# Patient Record
Sex: Female | Born: 1970 | Race: White | Hispanic: No | Smoking: Former smoker
Health system: Southern US, Community
[De-identification: ages and names within clinical notes are randomized; demographics above are authoritative.]

## PROBLEM LIST (undated history)

## (undated) DIAGNOSIS — I639 Cerebral infarction, unspecified: Secondary | ICD-10-CM

## (undated) DIAGNOSIS — I509 Heart failure, unspecified: Secondary | ICD-10-CM

## (undated) DIAGNOSIS — I10 Essential (primary) hypertension: Secondary | ICD-10-CM

## (undated) DIAGNOSIS — F410 Panic disorder [episodic paroxysmal anxiety] without agoraphobia: Secondary | ICD-10-CM

## (undated) DIAGNOSIS — C801 Malignant (primary) neoplasm, unspecified: Secondary | ICD-10-CM

## (undated) HISTORY — PX: ABDOMINAL HYSTERECTOMY: SHX81

## (undated) HISTORY — PX: BACK SURGERY: SHX140

## (undated) HISTORY — PX: TUBAL LIGATION: SHX77

---

## 1997-09-25 ENCOUNTER — Inpatient Hospital Stay (HOSPITAL_COMMUNITY): Admission: AD | Admit: 1997-09-25 | Discharge: 1997-09-25 | Payer: Self-pay | Admitting: Obstetrics & Gynecology

## 1997-12-13 ENCOUNTER — Emergency Department (HOSPITAL_COMMUNITY): Admission: EM | Admit: 1997-12-13 | Discharge: 1997-12-14 | Payer: Self-pay | Admitting: Emergency Medicine

## 1998-01-03 ENCOUNTER — Emergency Department (HOSPITAL_COMMUNITY): Admission: EM | Admit: 1998-01-03 | Discharge: 1998-01-03 | Payer: Self-pay | Admitting: Emergency Medicine

## 2001-04-23 ENCOUNTER — Emergency Department (HOSPITAL_COMMUNITY): Admission: EM | Admit: 2001-04-23 | Discharge: 2001-04-23 | Payer: Self-pay | Admitting: Emergency Medicine

## 2001-06-06 ENCOUNTER — Encounter: Payer: Self-pay | Admitting: General Practice

## 2001-06-06 ENCOUNTER — Encounter: Admission: RE | Admit: 2001-06-06 | Discharge: 2001-06-06 | Payer: Self-pay | Admitting: General Practice

## 2002-02-14 ENCOUNTER — Inpatient Hospital Stay (HOSPITAL_COMMUNITY): Admission: AD | Admit: 2002-02-14 | Discharge: 2002-02-14 | Payer: Self-pay | Admitting: *Deleted

## 2002-04-07 ENCOUNTER — Emergency Department (HOSPITAL_COMMUNITY): Admission: EM | Admit: 2002-04-07 | Discharge: 2002-04-07 | Payer: Self-pay | Admitting: Emergency Medicine

## 2002-10-02 ENCOUNTER — Encounter: Payer: Self-pay | Admitting: General Practice

## 2002-10-02 ENCOUNTER — Encounter: Admission: RE | Admit: 2002-10-02 | Discharge: 2002-10-02 | Payer: Self-pay | Admitting: General Practice

## 2002-12-31 ENCOUNTER — Encounter: Payer: Self-pay | Admitting: Emergency Medicine

## 2002-12-31 ENCOUNTER — Emergency Department (HOSPITAL_COMMUNITY): Admission: AD | Admit: 2002-12-31 | Discharge: 2002-12-31 | Payer: Self-pay | Admitting: Emergency Medicine

## 2003-01-03 ENCOUNTER — Encounter: Admission: RE | Admit: 2003-01-03 | Discharge: 2003-01-03 | Payer: Self-pay | Admitting: Internal Medicine

## 2003-01-17 ENCOUNTER — Encounter: Admission: RE | Admit: 2003-01-17 | Discharge: 2003-01-17 | Payer: Self-pay | Admitting: Internal Medicine

## 2003-03-04 ENCOUNTER — Encounter: Admission: RE | Admit: 2003-03-04 | Discharge: 2003-03-04 | Payer: Self-pay | Admitting: Internal Medicine

## 2003-04-30 ENCOUNTER — Inpatient Hospital Stay (HOSPITAL_COMMUNITY): Admission: AD | Admit: 2003-04-30 | Discharge: 2003-05-07 | Payer: Self-pay | Admitting: Obstetrics & Gynecology

## 2003-05-06 ENCOUNTER — Encounter (INDEPENDENT_AMBULATORY_CARE_PROVIDER_SITE_OTHER): Payer: Self-pay | Admitting: Cardiovascular Disease

## 2003-11-10 ENCOUNTER — Encounter: Admission: RE | Admit: 2003-11-10 | Discharge: 2003-11-10 | Payer: Self-pay | Admitting: Internal Medicine

## 2003-11-24 ENCOUNTER — Encounter: Admission: RE | Admit: 2003-11-24 | Discharge: 2003-11-24 | Payer: Self-pay | Admitting: Internal Medicine

## 2003-12-31 ENCOUNTER — Emergency Department (HOSPITAL_COMMUNITY): Admission: EM | Admit: 2003-12-31 | Discharge: 2003-12-31 | Payer: Self-pay | Admitting: Emergency Medicine

## 2004-01-19 ENCOUNTER — Emergency Department (HOSPITAL_COMMUNITY): Admission: EM | Admit: 2004-01-19 | Discharge: 2004-01-19 | Payer: Self-pay | Admitting: Family Medicine

## 2004-04-12 ENCOUNTER — Emergency Department (HOSPITAL_COMMUNITY): Admission: EM | Admit: 2004-04-12 | Discharge: 2004-04-12 | Payer: Self-pay | Admitting: Family Medicine

## 2004-04-26 ENCOUNTER — Emergency Department (HOSPITAL_COMMUNITY): Admission: EM | Admit: 2004-04-26 | Discharge: 2004-04-26 | Payer: Self-pay | Admitting: Family Medicine

## 2004-05-09 ENCOUNTER — Emergency Department (HOSPITAL_COMMUNITY): Admission: EM | Admit: 2004-05-09 | Discharge: 2004-05-09 | Payer: Self-pay | Admitting: Family Medicine

## 2004-06-21 ENCOUNTER — Inpatient Hospital Stay (HOSPITAL_COMMUNITY): Admission: AD | Admit: 2004-06-21 | Discharge: 2004-06-21 | Payer: Self-pay | Admitting: Obstetrics & Gynecology

## 2005-05-05 ENCOUNTER — Emergency Department (HOSPITAL_COMMUNITY): Admission: EM | Admit: 2005-05-05 | Discharge: 2005-05-05 | Payer: Self-pay | Admitting: Emergency Medicine

## 2005-08-06 ENCOUNTER — Emergency Department (HOSPITAL_COMMUNITY): Admission: EM | Admit: 2005-08-06 | Discharge: 2005-08-06 | Payer: Self-pay | Admitting: Emergency Medicine

## 2006-02-02 ENCOUNTER — Emergency Department (HOSPITAL_COMMUNITY): Admission: EM | Admit: 2006-02-02 | Discharge: 2006-02-03 | Payer: Self-pay | Admitting: Emergency Medicine

## 2006-06-14 ENCOUNTER — Emergency Department (HOSPITAL_COMMUNITY): Admission: EM | Admit: 2006-06-14 | Discharge: 2006-06-14 | Payer: Self-pay | Admitting: Family Medicine

## 2006-06-29 ENCOUNTER — Emergency Department (HOSPITAL_COMMUNITY): Admission: EM | Admit: 2006-06-29 | Discharge: 2006-06-30 | Payer: Self-pay | Admitting: Emergency Medicine

## 2006-11-17 ENCOUNTER — Emergency Department (HOSPITAL_COMMUNITY): Admission: EM | Admit: 2006-11-17 | Discharge: 2006-11-17 | Payer: Self-pay | Admitting: Emergency Medicine

## 2007-07-09 ENCOUNTER — Emergency Department (HOSPITAL_COMMUNITY): Admission: EM | Admit: 2007-07-09 | Discharge: 2007-07-09 | Payer: Self-pay | Admitting: Emergency Medicine

## 2007-11-10 ENCOUNTER — Emergency Department (HOSPITAL_COMMUNITY): Admission: EM | Admit: 2007-11-10 | Discharge: 2007-11-11 | Payer: Self-pay | Admitting: Emergency Medicine

## 2007-11-25 ENCOUNTER — Inpatient Hospital Stay (HOSPITAL_COMMUNITY): Admission: AD | Admit: 2007-11-25 | Discharge: 2007-11-25 | Payer: Self-pay | Admitting: Obstetrics and Gynecology

## 2007-11-30 ENCOUNTER — Emergency Department (HOSPITAL_COMMUNITY): Admission: EM | Admit: 2007-11-30 | Discharge: 2007-11-30 | Payer: Self-pay | Admitting: Psychology

## 2007-12-02 ENCOUNTER — Inpatient Hospital Stay (HOSPITAL_COMMUNITY): Admission: EM | Admit: 2007-12-02 | Discharge: 2007-12-05 | Payer: Self-pay | Admitting: Emergency Medicine

## 2008-05-04 ENCOUNTER — Emergency Department (HOSPITAL_COMMUNITY): Admission: EM | Admit: 2008-05-04 | Discharge: 2008-05-04 | Payer: Self-pay | Admitting: Emergency Medicine

## 2008-05-07 ENCOUNTER — Encounter: Admission: RE | Admit: 2008-05-07 | Discharge: 2008-05-07 | Payer: Self-pay | Admitting: Internal Medicine

## 2009-05-16 ENCOUNTER — Emergency Department (HOSPITAL_COMMUNITY): Admission: EM | Admit: 2009-05-16 | Discharge: 2009-05-17 | Payer: Self-pay | Admitting: Emergency Medicine

## 2009-05-17 ENCOUNTER — Ambulatory Visit: Payer: Self-pay | Admitting: Psychiatry

## 2009-12-05 ENCOUNTER — Emergency Department (HOSPITAL_COMMUNITY): Admission: EM | Admit: 2009-12-05 | Discharge: 2009-12-05 | Payer: Self-pay | Admitting: Family Medicine

## 2010-01-02 ENCOUNTER — Emergency Department (HOSPITAL_COMMUNITY): Admission: EM | Admit: 2010-01-02 | Discharge: 2010-01-02 | Payer: Self-pay | Admitting: Family Medicine

## 2010-07-11 ENCOUNTER — Encounter: Payer: Self-pay | Admitting: Orthopedic Surgery

## 2010-07-11 ENCOUNTER — Encounter: Payer: Self-pay | Admitting: Unknown Physician Specialty

## 2010-09-04 LAB — POCT URINALYSIS DIP (DEVICE)
Glucose, UA: NEGATIVE mg/dL
Ketones, ur: NEGATIVE mg/dL
Nitrite: NEGATIVE
Protein, ur: NEGATIVE mg/dL
Specific Gravity, Urine: 1.025 (ref 1.005–1.030)
Urobilinogen, UA: 0.2 mg/dL (ref 0.0–1.0)
pH: 5.5 (ref 5.0–8.0)

## 2010-09-04 LAB — WET PREP, GENITAL: Yeast Wet Prep HPF POC: NONE SEEN

## 2010-09-04 LAB — GC/CHLAMYDIA PROBE AMP, GENITAL
Chlamydia, DNA Probe: NEGATIVE
GC Probe Amp, Genital: NEGATIVE

## 2010-09-04 LAB — POCT PREGNANCY, URINE: Preg Test, Ur: NEGATIVE

## 2010-09-22 LAB — CBC
HCT: 42.1 % (ref 36.0–46.0)
Hemoglobin: 14.7 g/dL (ref 12.0–15.0)
MCHC: 34.8 g/dL (ref 30.0–36.0)
MCV: 89.7 fL (ref 78.0–100.0)
Platelets: 190 10*3/uL (ref 150–400)
RBC: 4.69 MIL/uL (ref 3.87–5.11)
RDW: 14.4 % (ref 11.5–15.5)
WBC: 8 10*3/uL (ref 4.0–10.5)

## 2010-09-22 LAB — RAPID URINE DRUG SCREEN, HOSP PERFORMED
Amphetamines: NOT DETECTED
Barbiturates: NOT DETECTED
Benzodiazepines: NOT DETECTED
Cocaine: NOT DETECTED
Opiates: NOT DETECTED
Tetrahydrocannabinol: POSITIVE — AB

## 2010-09-22 LAB — POCT CARDIAC MARKERS
CKMB, poc: 2.3 ng/mL (ref 1.0–8.0)
CKMB, poc: 4.1 ng/mL (ref 1.0–8.0)
Myoglobin, poc: 97.3 ng/mL (ref 12–200)
Myoglobin, poc: 97.6 ng/mL (ref 12–200)
Troponin i, poc: <0.05 ng/mL (ref 0.00–0.09)
Troponin i, poc: <0.05 ng/mL (ref 0.00–0.09)

## 2010-09-22 LAB — BASIC METABOLIC PANEL
BUN: 6 mg/dL (ref 6–23)
CO2: 20 mEq/L (ref 19–32)
Calcium: 9.2 mg/dL (ref 8.4–10.5)
Chloride: 109 mEq/L (ref 96–112)
Creatinine, Ser: 0.49 mg/dL (ref 0.4–1.2)
GFR calc Af Amer: >60 mL/min (ref >60)
GFR calc non Af Amer: >60 mL/min (ref >60)
Glucose, Bld: 111 mg/dL — ABNORMAL HIGH (ref 70–99)
Potassium: 4.1 mEq/L (ref 3.5–5.1)
Sodium: 135 mEq/L (ref 135–145)

## 2010-09-22 LAB — DIFFERENTIAL
Basophils Absolute: 0.1 10*3/uL (ref 0.0–0.1)
Basophils Relative: 1 % (ref 0–1)
Eosinophils Absolute: 0.1 10*3/uL (ref 0.0–0.7)
Eosinophils Relative: 1 % (ref 0–5)
Lymphocytes Relative: 24 % (ref 12–46)
Lymphs Abs: 1.9 10*3/uL (ref 0.7–4.0)
Monocytes Absolute: 0.5 10*3/uL (ref 0.1–1.0)
Monocytes Relative: 6 % (ref 3–12)
Neutro Abs: 5.5 10*3/uL (ref 1.7–7.7)
Neutrophils Relative %: 68 % (ref 43–77)

## 2010-09-22 LAB — POCT PREGNANCY, URINE: Preg Test, Ur: NEGATIVE

## 2010-09-22 LAB — SALICYLATE LEVEL
Salicylate Lvl: <4 mg/dL (ref 2.8–20.0)
Salicylate Lvl: <4 mg/dL (ref 2.8–20.0)

## 2010-09-22 LAB — ETHANOL: Alcohol, Ethyl (B): <5 mg/dL (ref 0–10)

## 2010-09-22 LAB — ACETAMINOPHEN LEVEL
Acetaminophen (Tylenol), Serum: <10 ug/mL — ABNORMAL LOW (ref 10–30)
Acetaminophen (Tylenol), Serum: <10 ug/mL — ABNORMAL LOW (ref 10–30)

## 2010-09-22 LAB — TRICYCLICS SCREEN, URINE: TCA Scrn: NOT DETECTED

## 2010-11-02 NOTE — H&P (Signed)
Heidi Johnson, Heidi Johnson            ACCOUNT NO.:  [id number]   MEDICAL RECORD NO.:  [id number]          PATIENT TYPE:  INP   LOCATION:  0101                         FACILITY:  [facility]   PHYSICIAN:  Isidor Holts, M.D.  DATE OF BIRTH:  July 28, 1970   DATE OF ADMISSION:  12/02/2007  DATE OF DISCHARGE:                              HISTORY & PHYSICAL   PRIMARY MEDICAL DOCTOR:  Unassigned.   CHIEF COMPLAINT:  Boil left buttock, associated with pain and redness  over the past 3-4 days, also with drainage. The patient has been  utilizing heat pads.   HISTORY OF PRESENT ILLNESS:  Essentially as above.  The patient denies  any pyrexia.  The patient was seen at the emergency department for above  symptoms approximately 2 days ago, and discharged after evaluation, on  Percocet and Doxycycline.  However, symptoms have escalated with  increasing pain, redness and drainage.  The patient has now developed  some spots in the lower back.   PAST MEDICAL HISTORY.:  1. Hypertension.  2. GERD.  3. 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  4. Ex-smoker, quit approximately one month ago.   MEDICATION HISTORY.:  1. Enalapril 5 mg p.o. daily.  2. Percocet p.r.n.  3. Doxycycline 100 mg p.o. b.i.d.   Last two medications were started 2 days ago.   ALLERGIES:  Septrin/Sulfa   REVIEW OF SYSTEMS:  As per HPI and chief complaint.  Denies vomiting or  diarrhea.  Denies abdominal pain.  Denies chest pain, shortness of  breath or cough.   SOCIAL HISTORY:  The patient is single, has three offspring, ex-smoker,  used to smoke one pack of cigarettes a day, but quit on Oct 31, 2007.  Denies alcohol use or drug abuse.  However utilizes marijuana.  She  works as a Investment banker, operational at [employer].   FAMILY HISTORY:  Mother is age 23 years.  She has hypertension and  dyslipidemia.  Father passed away at age 46 years from a ruptured brain  aneurysm.  He  had a history of heart disease.   PHYSICAL EXAMINATION:  VITALS:  Temperature 98.5, pulse 91 per minute,  respiratory 20, BP 151/93 mmHg, pulse oximeter 100% on 3 liters of  oxygen.  The patient did not appear to be in obvious acute distress at  time of this evaluation.  Alert, communicative, garrulous, not short of  breath at rest.  HEENT:  No clinical pallor or jaundice.  No conjunctival injection.  Throat is clear.  NECK:  Supple.  JVP not seen.  No palpable lymphadenopathy.  No palpable  goiter.  CHEST:  Clinically clear to auscultation.  No wheezes or crackles.  HEART:  Sounds 1 and 2 heard, normal, regular, no murmurs.  ABDOMEN:  Full, soft and nontender.  No palpable organomegaly.  No  palpable masses.  Normal bowel sounds.  LOWER EXTREMITIES:  No pitting edema.  Palpable peripheral pulses.  The  patient has a circumscribed area approximately 10 cm in diameter with  induration and a central area of drainage in the  left lower gluteal  region/posterior upper thigh.  There is a kissing lesion directly  opposite.  She also has two approximately 1 cm localized reddish lesions  in the lower back which may be early boils.  MUSCULOSKELETAL:  System unremarkable.  CENTRAL NERVOUS SYSTEM: No focal neurologic deficit on gross  examination.   INVESTIGATIONS:  Labs are still pending.   ASSESSMENT AND PLAN:  1. Left gluteal abscess/cellulitis.  I have discussed with ED MD, and      he has done an I&D in the emergency department.  I shall request      culture of abscess, do blood cultures.  Meanwhile, commence therapy      with intravenous Vancomycin, given previous history of MRSA.  We      shall also institute contact precautions.   1. Hypertension.  Blood pressure is uncontrolled at present.  We shall      continue the patient's pre-admission dose of Enalapril, monitor,      adjust medications as indicated.   1. GERD.  We shall continue proton pump inhibitor during the course of       this hospitalization.   Note:  For completeness, will do urine drug screen and HbA1c.   Further management will depend on clinical course.      Isidor Holts, M.D.  Electronically Signed     CO/MEDQ  D:  12/02/2007  T:  12/02/2007  Job:  [id number]

## 2010-11-02 NOTE — Discharge Summary (Signed)
Heidi Johnson, Heidi Johnson            ACCOUNT NO.:  [id number]   MEDICAL RECORD NO.:  [id number]          PATIENT TYPE:  INP   LOCATION:  1613                         FACILITY:  [facility]   PHYSICIAN:  Isidor Holts, M.D.  DATE OF BIRTH:  12-Jan-1971   DATE OF ADMISSION:  12/02/2007  DATE OF DISCHARGE:  12/05/2007                               DISCHARGE SUMMARY   DISCHARGE DIAGNOSES:  1. Methicillin resistant Staphylococcus aureus abscess left posterior      upper thigh region, status post incision and drainage on December 02, 2007.  2. Hypertension.  3. Gastroesophageal reflux disease).  4. Ex-smoker, quit approximately 1 month ago.   DISCHARGE MEDICATIONS:  1. Doxycycline 100 mg p.o. b.i.d. for 11 days only.  2. Oxycodone 5 mg p.o. p.r.n. q.6 h. A total of 20 pills have been      dispensed.  3. Enalapril 10 mg p.o. daily (was on 5 mg p.o. daily).   PROCEDURES:  None.   CONSULTATIONS:  None.   ADMISSION HISTORY:  As in H and P notes of December 02, 2007.  However, in  brief, this is a 40 year old female, with known history of hypertension,  GERD, mons furunculosis November of 2004 secondary to MRSA, associated  with MRSA bacteremia, ex-smoker quit approximately 1 month ago now  presenting with a boil in the left upper thigh posteriorly just below  the left buttock and associated with increasing pain and redness over a  3-4 day period.  Also with drainage.  The patient was initially seen in  the emergency department 2 days prior to admission, treated with  Percocet and Doxycycline, and then discharged.  However, because of  persistent symptomatology, the patient represented and was admitted for  further evaluation, investigation and management.   CLINICAL COURSE:  1. MRSA abscess posterior left upper thigh.  For details of      presentation, refer to admission history, above.  I&D was carried      out in the emergency department by emergency room MD.  The patient      was commenced  on intravenous Vancomycin, given her known history of      previous MRSA infection, contact isolation was instituted,      analgesics were administered and regular dressings were carried out      on a twice daily basis, supervised by the wound management care      team.  Blood cultures remained consistently negative, however,      wound cultures grew MRSA sensitive to Doxycycline and Bactrim, as      well as Vancomycin.  Clinical improvement was steady, with      diminution of perifocal inflammatory phenomena and drainage, and by      December 05, 2007, the patient felt considerably better, there was no      further perifocal induration, and discharge was minimal.  She has      been transitioned to Doxycycline, and as of the date of this      dictation had completed 3 days of intravenous Vancomycin.      Doxycycline will  be continued for further period of 11 days, to      complete a total of 14 days of treatment.  Home health RN has been      arranged for dressing changes.   1. Hypertension.  This was addressed with Enalapril at an increased      dose of 10 mg p.o. daily.  The patient has remained normotensive,      during the course of her hospitalization.   1. GERD.  The patient was managed with proton pump inhibitor.  There      were no problems referable to this, during the course of her      hospitalization.   DISPOSITION:  The patient was on December 05, 2007, considered sufficiently  clinically stable and recovered to be discharged. She was therefore,  discharged accordingly.   DIET:  Heart-healthy.   ACTIVITY:  As tolerated.   WOUND CARE:  The patient may shower and bathe.  She has been recommended  twice daily dressing changes, with wound packed with iodoform.   FOLLOWUP INSTRUCTIONS:  The patient is recommended to follow up with her  primary MD per prior scheduled appointment.   SPECIAL INSTRUCTIONS:  Home health RN has been arranged for dressing  changes.  The patient is  recommended to return to regular duties on December 12, 2007.      Isidor Holts, M.D.  Electronically Signed     CO/MEDQ  D:  12/05/2007  T:  12/05/2007  Job:  [id number]

## 2010-11-05 NOTE — Discharge Summary (Signed)
NAME:  Heidi Johnson, Heidi Johnson                      ACCOUNT NO.:  [id number]   MEDICAL RECORD NO.:  [id number]                   PATIENT TYPE:  INP   LOCATION:  9312                                 FACILITY:  [facility]   PHYSICIAN:  Lesly Dukes, M.D.              DATE OF BIRTH:  1971-05-07   DATE OF ADMISSION:  04/30/2003  DATE OF DISCHARGE:                                 DISCHARGE SUMMARY   CONSULTS:  1. Surgery - Dr. Consuello Bossier.  2. Infectious disease - Dr. Cliffton Asters.   DISCHARGE DIAGNOSES:  1. Mons furunculosis, methicillin-resistant Staphylococcus aureus.  2. Methicillin-resistant Staphylococcus aureus bacteremia.  3. Hypertension.  4. Gastroesophageal reflux disease.   DISCHARGE MEDICATIONS:  1. Doxycycline 100 mg p.o. b.i.d. x7 days.  2. Percocet 5/325 mg one to two tablets p.o. q.4-6h. p.r.n. pain.  3. Singulair 10 mg p.o. daily.  4. Protonix 40 mg p.o. daily.  5. Hydrochlorothiazide 25 mg p.o. daily.  6. Lisinopril 20 mg p.o. daily.  7. Bactroban or Neosporin ointment to affected area daily x3 days.   HOSPITAL COURSE BY PROBLEM LIST:  Heidi Johnson is a 40 year old female who  was admitted for a suprapubic rash that she had been having over four days  that was diagnosed to be furunculosis.  Infectious disease was consulted.  Dr. Roxan Hockey and Dr. Orvan Falconer reported that there is an outbreak of MRSA  infection in young, healthy people without risk factors.  Wound culture was  found to be positive for MRSA in addition to blood cultures positive for  MRSA after around 36 hours.  The patient was loaded with Vancomycin on her  initial day prior to positive blood cultures but this was continued  throughout the hospital course so that the patient received a full total  dose of seven days IV vancomycin.  Surgery was consulted and performed  several incision and drainages which did not release much purulent material.  To evaluate the patient's MRSA bacteremia a  transesophageal echocardiogram  was performed to determine if there were any valvular growths.  This was  found to be negative for bacterial endocarditis.  The patient had repeat  blood cultures drawn on May 04, 2003 after four days of IV vancomycin  which returned as no growth x48 hours.  During the patient's hospital stay  her hypertension and gastroesophageal  reflux disease were managed with her home medications with no changes.  The  patient was discharged on May 07, 2003 in stable and improved  condition.  The patient will follow up with Dr. Zachery Dakins in surgery one  week after discharge.     Estill Cotta, MD                              Lesly Dukes, M.D.    AW/MEDQ  D:  05/07/2003  T:  05/07/2003  Job:  [id number]  cc:   Anselm Pancoast. Zachery Dakins, M.D.  [address]  [place]  [place] [address]  Fax: [phone]

## 2010-11-05 NOTE — Op Note (Signed)
NAME:  CHIKITA, DOGAN                      ACCOUNT NO.:  [id number]   MEDICAL RECORD NO.:  [id number]                   PATIENT TYPE:  INP   LOCATION:  9312                                 FACILITY:  [facility]   PHYSICIAN:  Anselm Pancoast. Zachery Dakins, M.D.          DATE OF BIRTH:  Aug 25, 1970   DATE OF PROCEDURE:  04/30/2003  DATE OF DISCHARGE:                                 OPERATIVE REPORT   COMPLAINT:  Abscess of groin.   HISTORY:  Heidi Johnson is a 40 year old Caucasian female who presented  to the emergency room with the following history.  She said that  approximately two weeks ago she had a tooth infection and was placed on  penicillin and Flagyl by her dentist.  He later removed the tooth and the  area is improving.  She then started developing problems with infection  which he thought was an ingrown hair in the pubic area.  She does shave her  pubic area and these got worse and then over the last 48 hours she has  applied fat back to some of these areas to try to bring them to a head.   She presented to the emergency room here this afternoon and on physical exam  she had a low grade temperature of 99.2, pulse 97, blood pressure 131/97.  On physical exam her infections appear to be more related to the pubic area  in that she has got a fairly large area that is draining kind of thick  purulence in the right side of the suprapubic area and then several other  smaller abscesses within the genitalia area, some that are draining and  others are just fluctuant.  There is an area of erythema and an abscess over  in the left lateral back that is not really in the pubic hair but is in the  same lymphatic distribution as these areas and she was being admitted by Dr.  Ophelia Charter who asked for general surgical consultation to see whether we were  developing a problem of necrotizing fasciitis or could this be a MRSA  infection.  On physical exam she has had some pain medication and is a  little  bit woozy but is able to ambulate and go to the bathroom and void and  then returned.   DESCRIPTION OF PROCEDURE:  On physical exam she has an obvious abscess in  the right suprapubic area that I anesthetized and opened up.  There was a  moderate amount of frank purulent drainage which was cultured and then I  opened another one of these little areas and will get a gram stain and  culture that with the same swab.  The abscesses are probably just kind of  multiple staph abscesses probably secondary to shaving her pubic hair, and  she has been on penicillin and Flagyl but these are not anti-staph drugs and  as far as the likelihood of this being MRSA, it is probably  more likely just  a pure staph infection in my opinion.  I do not see any areas that are not  in this localized area that would make you consider a systemic septicemia  with these multiple abscesses on my examination of her back, breasts,  axillae, legs, feet, etc.  She denies being a diabetic.  She is slightly  overweight and we will check a glucose and I would recommend that she be on  Unasyn and Flagyl for now or Kefzol and Flagyl and we will see what gram  stain shows, if this shows a staph gram-positive cocci, which is what I  would expect.  She has had a pelvic exam that did not show any infection  either in her vagina and the larger abscess that I unroofed may require  further drainage but hopefully with this drainage and warm soaks no further  drainage will be necessary.   DISPOSITION:  She will be admitted to room number 312 and I will see her  again in the  morning.                                               Anselm Pancoast. Zachery Dakins, M.D.    WJW/MEDQ  D:  04/30/2003  T:  05/01/2003  Job:  [id number]

## 2011-03-10 LAB — CBC
HCT: 39.2
Hemoglobin: 13.7
MCHC: 34.9
MCV: 87.2
Platelets: 196
RBC: 4.5
RDW: 14.9
WBC: 7.5

## 2011-03-10 LAB — COMPREHENSIVE METABOLIC PANEL
ALT: 28
AST: 29
Albumin: 4
Alkaline Phosphatase: 64
BUN: 5 — ABNORMAL LOW
CO2: 24
Calcium: 9.3
Chloride: 109
Creatinine, Ser: 0.77
GFR calc Af Amer: >60
GFR calc non Af Amer: >60
Glucose, Bld: 86
Potassium: 4.3
Sodium: 141
Total Bilirubin: 1
Total Protein: 6.4

## 2011-03-10 LAB — URINE CULTURE: Colony Count: >=100000

## 2011-03-10 LAB — DIFFERENTIAL
Basophils Absolute: 0.1
Basophils Relative: 1
Eosinophils Absolute: 0.1
Eosinophils Relative: 2
Lymphocytes Relative: 33
Lymphs Abs: 2.5
Monocytes Absolute: 0.6
Monocytes Relative: 8
Neutro Abs: 4.3
Neutrophils Relative %: 57

## 2011-03-10 LAB — URINALYSIS, ROUTINE W REFLEX MICROSCOPIC
Bilirubin Urine: NEGATIVE
Glucose, UA: NEGATIVE
Hgb urine dipstick: NEGATIVE
Ketones, ur: NEGATIVE
Nitrite: POSITIVE — AB
Protein, ur: NEGATIVE
Specific Gravity, Urine: 1.015
Urobilinogen, UA: 0.2
pH: 7

## 2011-03-10 LAB — RAPID URINE DRUG SCREEN, HOSP PERFORMED
Amphetamines: NOT DETECTED
Barbiturates: NOT DETECTED
Benzodiazepines: POSITIVE — AB
Cocaine: NOT DETECTED
Opiates: NOT DETECTED
Tetrahydrocannabinol: POSITIVE — AB

## 2011-03-10 LAB — POCT CARDIAC MARKERS
CKMB, poc: 3.4
Myoglobin, poc: 71.8
Operator id: 1192
Troponin i, poc: <0.05

## 2011-03-10 LAB — URINE MICROSCOPIC-ADD ON

## 2011-03-16 LAB — DIFFERENTIAL
Basophils Absolute: 0.1
Basophils Relative: 1
Eosinophils Absolute: 0.1
Eosinophils Relative: 1
Lymphocytes Relative: 35
Lymphs Abs: 2.7
Monocytes Absolute: 0.4
Monocytes Relative: 5
Neutro Abs: 4.5
Neutrophils Relative %: 58

## 2011-03-16 LAB — URINALYSIS, ROUTINE W REFLEX MICROSCOPIC
Bilirubin Urine: NEGATIVE
Glucose, UA: NEGATIVE
Ketones, ur: NEGATIVE
Nitrite: NEGATIVE
Protein, ur: NEGATIVE
Specific Gravity, Urine: 1.025
Urobilinogen, UA: 0.2
pH: 5.5

## 2011-03-16 LAB — POCT CARDIAC MARKERS
CKMB, poc: 3.8
CKMB, poc: 4.2
Myoglobin, poc: 108
Myoglobin, poc: 86.2
Operator id: 294591
Operator id: 294591
Troponin i, poc: <0.05
Troponin i, poc: <0.05

## 2011-03-16 LAB — CBC
HCT: 37.7
Hemoglobin: 13.1
MCHC: 34.7
MCV: 87.6
Platelets: 156
RBC: 4.31
RDW: 15
WBC: 7.7

## 2011-03-16 LAB — POCT I-STAT, CHEM 8
BUN: 11
Calcium, Ion: 1.18
Chloride: 107
Creatinine, Ser: 0.8
Glucose, Bld: 84
HCT: 42
Hemoglobin: 14.3
Potassium: 3.7
Sodium: 141
TCO2: 22

## 2011-03-16 LAB — URINE MICROSCOPIC-ADD ON

## 2011-03-16 LAB — POCT PREGNANCY, URINE
Operator id: 294591
Preg Test, Ur: NEGATIVE

## 2011-03-17 LAB — CBC
HCT: 38.1
HCT: 34.3 — ABNORMAL LOW
HCT: 35.7 — ABNORMAL LOW
Hemoglobin: 13
Hemoglobin: 11.6 — ABNORMAL LOW
Hemoglobin: 12
MCHC: 34
MCHC: 33.4
MCHC: 33.9
MCV: 87.6
MCV: 87.4
MCV: 88.1
Platelets: 172
Platelets: 149 — ABNORMAL LOW
Platelets: 168
RBC: 4.35
RBC: 3.92
RBC: 4.06
RDW: 15.4
RDW: 14.6
RDW: 15.2
WBC: 8.9
WBC: 5.7
WBC: 7.3

## 2011-03-17 LAB — DIFFERENTIAL
Basophils Absolute: 0
Basophils Relative: 1
Eosinophils Absolute: 0.1
Eosinophils Relative: 1
Lymphocytes Relative: 18
Lymphs Abs: 1.6
Monocytes Absolute: 0.4
Monocytes Relative: 5
Neutro Abs: 6.8
Neutrophils Relative %: 76

## 2011-03-17 LAB — WOUND CULTURE

## 2011-03-17 LAB — COMPREHENSIVE METABOLIC PANEL
ALT: 36 — ABNORMAL HIGH
AST: 32
Albumin: 3.4 — ABNORMAL LOW
Alkaline Phosphatase: 80
BUN: 5 — ABNORMAL LOW
CO2: 22
Calcium: 8.9
Chloride: 112
Creatinine, Ser: 0.65
GFR calc Af Amer: >60
GFR calc non Af Amer: >60
Glucose, Bld: 132 — ABNORMAL HIGH
Potassium: 3.6
Sodium: 140
Total Bilirubin: 0.7
Total Protein: 6.9

## 2011-03-17 LAB — CULTURE, BLOOD (ROUTINE X 2)
Culture: NO GROWTH
Culture: NO GROWTH

## 2011-03-17 LAB — VANCOMYCIN, TROUGH: Vancomycin Tr: 6.6

## 2011-03-17 LAB — WET PREP, GENITAL
Clue Cells Wet Prep HPF POC: NONE SEEN
Trich, Wet Prep: NONE SEEN
Yeast Wet Prep HPF POC: NONE SEEN

## 2011-03-17 LAB — BASIC METABOLIC PANEL
BUN: 4 — ABNORMAL LOW
CO2: 29
Calcium: 8.7
Chloride: 108
Creatinine, Ser: 0.59
GFR calc Af Amer: >60
GFR calc non Af Amer: >60
Glucose, Bld: 108 — ABNORMAL HIGH
Potassium: 3.8
Sodium: 143

## 2011-03-17 LAB — GC/CHLAMYDIA PROBE AMP, GENITAL
Chlamydia, DNA Probe: NEGATIVE
GC Probe Amp, Genital: NEGATIVE

## 2011-03-17 LAB — HEMOGLOBIN A1C
Hgb A1c MFr Bld: 5.2
Mean Plasma Glucose: 108

## 2013-11-20 DIAGNOSIS — M549 Dorsalgia, unspecified: Secondary | ICD-10-CM | POA: Insufficient documentation

## 2015-01-13 ENCOUNTER — Encounter (HOSPITAL_COMMUNITY): Payer: Self-pay | Admitting: *Deleted

## 2015-01-13 DIAGNOSIS — Z91040 Latex allergy status: Secondary | ICD-10-CM | POA: Insufficient documentation

## 2015-01-13 DIAGNOSIS — Z3202 Encounter for pregnancy test, result negative: Secondary | ICD-10-CM | POA: Diagnosis not present

## 2015-01-13 DIAGNOSIS — R51 Headache: Secondary | ICD-10-CM | POA: Insufficient documentation

## 2015-01-13 DIAGNOSIS — R202 Paresthesia of skin: Secondary | ICD-10-CM | POA: Insufficient documentation

## 2015-01-13 DIAGNOSIS — I509 Heart failure, unspecified: Secondary | ICD-10-CM | POA: Diagnosis not present

## 2015-01-13 DIAGNOSIS — Z8673 Personal history of transient ischemic attack (TIA), and cerebral infarction without residual deficits: Secondary | ICD-10-CM | POA: Diagnosis not present

## 2015-01-13 DIAGNOSIS — F1110 Opioid abuse, uncomplicated: Secondary | ICD-10-CM | POA: Insufficient documentation

## 2015-01-13 DIAGNOSIS — F1210 Cannabis abuse, uncomplicated: Secondary | ICD-10-CM | POA: Insufficient documentation

## 2015-01-13 DIAGNOSIS — I10 Essential (primary) hypertension: Secondary | ICD-10-CM | POA: Diagnosis not present

## 2015-01-13 NOTE — ED Notes (Signed)
Pt states that she had her blood drawn at [facility]. States she also had an echo at [facility].

## 2015-01-13 NOTE — ED Notes (Addendum)
Pt states that she has been seen at [facility] multiple times with multiple complaints. States that today she is experiencing headache and dehydration.  States that 2 days ago she was at [facility] and had a workup for stroke but had no brain bleed but was diagnosed with a stroke. Pt was also worked up for chest pain. States that they told her tonight that she was dehydrated but they did not give her any fluids. Denies CP. States that she needs an explanation. States that they would not give her results to her tonight.

## 2015-01-14 ENCOUNTER — Emergency Department (HOSPITAL_COMMUNITY)
Admission: EM | Admit: 2015-01-14 | Discharge: 2015-01-14 | Disposition: A | Discharge status: Home / Self Care | Payer: Medicare Other | Attending: Emergency Medicine | Admitting: Emergency Medicine

## 2015-01-14 ENCOUNTER — Encounter (HOSPITAL_COMMUNITY): Payer: Self-pay | Admitting: Radiology

## 2015-01-14 ENCOUNTER — Emergency Department (HOSPITAL_COMMUNITY): Payer: Medicare Other

## 2015-01-14 DIAGNOSIS — R51 Headache: Secondary | ICD-10-CM | POA: Diagnosis not present

## 2015-01-14 DIAGNOSIS — I10 Essential (primary) hypertension: Secondary | ICD-10-CM

## 2015-01-14 DIAGNOSIS — R519 Headache, unspecified: Secondary | ICD-10-CM

## 2015-01-14 HISTORY — DX: Cerebral infarction, unspecified: I63.9

## 2015-01-14 HISTORY — DX: Heart failure, unspecified: I50.9

## 2015-01-14 LAB — URINE MICROSCOPIC-ADD ON

## 2015-01-14 LAB — COMPREHENSIVE METABOLIC PANEL
ALT: 17 U/L (ref 14–54)
AST: 21 U/L (ref 15–41)
Albumin: 4.5 g/dL (ref 3.5–5.0)
Alkaline Phosphatase: 71 U/L (ref 38–126)
Anion gap: 11 (ref 5–15)
BUN: 13 mg/dL (ref 6–20)
CO2: 18 mmol/L — ABNORMAL LOW (ref 22–32)
Calcium: 9.3 mg/dL (ref 8.9–10.3)
Chloride: 105 mmol/L (ref 101–111)
Creatinine, Ser: 0.84 mg/dL (ref 0.44–1.00)
GFR calc Af Amer: >60 mL/min (ref >60)
GFR calc non Af Amer: >60 mL/min (ref >60)
Glucose, Bld: 101 mg/dL — ABNORMAL HIGH (ref 65–99)
Potassium: 3.4 mmol/L — ABNORMAL LOW (ref 3.5–5.1)
Sodium: 134 mmol/L — ABNORMAL LOW (ref 135–145)
Total Bilirubin: 1.1 mg/dL (ref 0.3–1.2)
Total Protein: 7.6 g/dL (ref 6.5–8.1)

## 2015-01-14 LAB — CBC WITH DIFFERENTIAL/PLATELET
Basophils Absolute: 0.1 10*3/uL (ref 0.0–0.1)
Basophils Relative: 1 % (ref 0–1)
Eosinophils Absolute: 0.1 10*3/uL (ref 0.0–0.7)
Eosinophils Relative: 1 % (ref 0–5)
HCT: 47.9 % — ABNORMAL HIGH (ref 36.0–46.0)
Hemoglobin: 16.3 g/dL — ABNORMAL HIGH (ref 12.0–15.0)
Lymphocytes Relative: 26 % (ref 12–46)
Lymphs Abs: 3.2 10*3/uL (ref 0.7–4.0)
MCH: 31.4 pg (ref 26.0–34.0)
MCHC: 34 g/dL (ref 30.0–36.0)
MCV: 92.3 fL (ref 78.0–100.0)
Monocytes Absolute: 0.8 10*3/uL (ref 0.1–1.0)
Monocytes Relative: 7 % (ref 3–12)
Neutro Abs: 8.2 10*3/uL — ABNORMAL HIGH (ref 1.7–7.7)
Neutrophils Relative %: 65 % (ref 43–77)
Platelets: 200 10*3/uL (ref 150–400)
RBC: 5.19 MIL/uL — ABNORMAL HIGH (ref 3.87–5.11)
RDW: 14.4 % (ref 11.5–15.5)
WBC: 12.4 10*3/uL — ABNORMAL HIGH (ref 4.0–10.5)

## 2015-01-14 LAB — URINALYSIS, ROUTINE W REFLEX MICROSCOPIC
Bilirubin Urine: NEGATIVE
Glucose, UA: NEGATIVE mg/dL
Ketones, ur: 15 mg/dL — AB
Leukocytes, UA: NEGATIVE
Nitrite: NEGATIVE
Protein, ur: NEGATIVE mg/dL
Specific Gravity, Urine: 1.03 (ref 1.005–1.030)
Urobilinogen, UA: 0.2 mg/dL (ref 0.0–1.0)
pH: 5 (ref 5.0–8.0)

## 2015-01-14 LAB — RAPID URINE DRUG SCREEN, HOSP PERFORMED
Amphetamines: NOT DETECTED
Barbiturates: NOT DETECTED
Benzodiazepines: POSITIVE — AB
Cocaine: NOT DETECTED
Opiates: POSITIVE — AB
Tetrahydrocannabinol: POSITIVE — AB

## 2015-01-14 LAB — I-STAT TROPONIN, ED: Troponin i, poc: 0.01 ng/mL (ref 0.00–0.08)

## 2015-01-14 LAB — POC URINE PREG, ED: Preg Test, Ur: NEGATIVE

## 2015-01-14 MED ORDER — CLONIDINE HCL 0.1 MG PO TABS
0.1000 mg | ORAL_TABLET | Freq: Once | ORAL | Status: AC
  Administered 2015-01-14: 0.1 mg via ORAL
  Filled 2015-01-14: qty 1

## 2015-01-14 MED ORDER — BUTALBITAL-APAP-CAFFEINE 50-325-40 MG PO TABS: 1.0000 | ORAL_TABLET | Freq: Four times a day (QID) | ORAL | Status: AC | PRN

## 2015-01-14 MED ORDER — KETOROLAC TROMETHAMINE 30 MG/ML IJ SOLN
30.0000 mg | Freq: Once | INTRAMUSCULAR | Status: AC
  Administered 2015-01-14: 30 mg via INTRAVENOUS
  Filled 2015-01-14: qty 1

## 2015-01-14 MED ORDER — MORPHINE SULFATE 4 MG/ML IJ SOLN
4.0000 mg | Freq: Once | INTRAMUSCULAR | Status: AC
  Administered 2015-01-14: 4 mg via INTRAVENOUS
  Filled 2015-01-14: qty 1

## 2015-01-14 MED ORDER — ONDANSETRON HCL 4 MG/2ML IJ SOLN
4.0000 mg | Freq: Once | INTRAMUSCULAR | Status: AC
  Administered 2015-01-14: 4 mg via INTRAVENOUS
  Filled 2015-01-14: qty 2

## 2015-01-14 NOTE — ED Provider Notes (Signed)
This chart was scribed for  [place], DO by Altamease Oiler, ED Scribe. This patient was seen in room B15C/B15C and the patient's care was started at 2:40 AM.  TIME SEEN: 2:40 AM   CHIEF COMPLAINT: Headache  HPI: Heidi Johnson is a 44 y.o. female with PMHx of HTN who presents to the Emergency Department complaining of moderate, diffuse, throbbing headache with onset this morning. She rates the pain 7/10 in severity and describes it a throbbing. States she has felt some tingling to the right lateral leg but no numbness or focal weakness. No new back pain. She denies new  bowel or bladder incontinence and difficulty urinating. Her last back surgery was 3 years ago. No recent back injections. No fever.  Patient reports she was seen at [facility] 2 days ago and admitted to the hospital. Outside records report patient had multiple negative troponins. She was complaining of sharp chest pain or shortness of breath at that time. Also complaining of headache. Had a negative head CT. Had an echocardiogram as well during this admission which showed an EF of 60-65% with no regional wall motion abnormalities and normal diastolic function. Also normal systolic function. There was mild to moderate left atrial dilation. No obvious pericardial effusion.  States she is here today because she continues to have headaches and has been hypertensive at home. She states she was started on metoprolol and verapamil which she has been taking. She denies blurry vision, chest pain or shortness of breath currently, numbness or focal weakness. No head injury. Not on anticoagulation.   ROS: See HPI Constitutional: no fever  Eyes: no drainage  ENT: no runny nose   Cardiovascular:  no chest pain  Resp: no SOB  GI: no vomiting GU: no dysuria Integumentary: no rash  Allergy: no hives  Musculoskeletal: no leg swelling  Neurological: no slurred speech ROS otherwise negative  PAST MEDICAL  HISTORY/PAST SURGICAL HISTORY:  Past Medical History  Diagnosis Date  . Stroke   . CHF (congestive heart failure)     MEDICATIONS:  Prior to Admission medications   Not on File    ALLERGIES:  Allergies  Allergen Reactions  . Septra [Sulfamethoxazole-Trimethoprim]     unknown  . Latex Rash    SOCIAL HISTORY:  History  Substance Use Topics  . Smoking status: Never Smoker   . Smokeless tobacco: Not on file  . Alcohol Use: No    FAMILY HISTORY: No family history on file.  EXAM: BP 167/116 mmHg  Pulse 64  Temp(Src) 98.7 F (37.1 C) (Oral)  Resp 18  SpO2 96% CONSTITUTIONAL: Alert and oriented and responds appropriately to questions. Well-appearing; well-nourished HEAD: Normocephalic EYES: Conjunctivae clear, PERRL ENT: normal nose; no rhinorrhea; moist mucous membranes; pharynx without lesions noted NECK: Supple, no meningismus, no LAD  CARD: RRR; S1 and S2 appreciated; no murmurs, no clicks, no rubs, no gallops RESP: Normal chest excursion without splinting or tachypnea; breath sounds clear and equal bilaterally; no wheezes, no rhonchi, no rales, no hypoxia or respiratory distress, speaking full sentences ABD/GI: Normal bowel sounds; non-distended; soft, non-tender, no rebound, no guarding, no peritoneal signs BACK:  The back appears normal and is non-tender to palpation, there is no CVA tenderness EXT: Normal ROM in all joints; non-tender to palpation; no edema; normal capillary refill; no cyanosis, no calf tenderness or swelling    SKIN: Normal color for age and race; warm NEURO: Moves all extremities equally, sensation to light touch intact diffusely,  Pt reports paraesthesias over lateral right thigh and calf; cranial nerves II through XII intact PSYCH: The patient's mood and manner are appropriate. Grooming and personal hygiene are appropriate.  MEDICAL DECISION MAKING:  Patient here with headache. She states she is having some tingling in the right lateral thigh  and right lateral calf but does not involve the entire leg or a specific dermatome. She has no new back pain. No focal neurologic deficits otherwise on exam. She is hypertensive in the emergency department. Repeat head CT today as well as labs, urine. Will give pain medication, clonidine and reassess. She reports that normally her blood pressure is in the 200s/100s but she states some days when she sees her psychiatrist that'll be in the 140s/70s.  ED PROGRESS:  Patient's labs are unremarkable other than a bicarbonate of 18 which is nonspecific. She was previously at [facility] with normal bicarbonate. Normal anion gap. Troponin negative. Urine shows trace hemoglobin which she has had in 2012. Her blood pressure has improved but her headache has remained the same. I doubt that her headache is secondary to her hypertension. She states that she has had to have "injections" for her headaches in the past. Her head CT today is negative. She states she has follow-up with a neurologist next week. She also has a primary care physician for follow-up in [place]. Have advised her to continue her blood pressure medication. Given she has some days where her blood pressure will be in the 140s/70s do not feel is appropriate to start her on a new regimen from the emergency department. Have advised her to keep a log of her blood pressures to follow-up with her primary care physician with. Have discussed with her return precautions. She verbalizes understanding and is comfortable with plan. We'll give a dose of Toradol for headache prior to discharge and will discharge with prescription for Fioricet.     EKG Interpretation  Date/Time:  Wednesday January 14 2015 05:23:49 EDT Ventricular Rate:  65 PR Interval:  153 QRS Duration: 108 QT Interval:  414 QTC Calculation: 430 R Axis:   50 Text Interpretation:  Sinus rhythm Borderline ST elevation, anterior leads No significant change since last tracing  in 2010 Confirmed by Rick Warnick,  DO, Aleina Burgio [phone]) on 01/14/2015 5:29:50 AM        I personally performed the services described in this documentation, which was scribed in my presence. The recorded information has been reviewed and is accurate.     [place], DO 01/14/15 [phone]

## 2015-01-14 NOTE — Discharge Instructions (Signed)
Hypertension °Hypertension, commonly called high blood pressure, is when the force of blood pumping through your arteries is too strong. Your arteries are the blood vessels that carry blood from your heart throughout your body. A blood pressure reading consists of a higher number over a lower number, such as 110/72. The higher number (systolic) is the pressure inside your arteries when your heart pumps. The lower number (diastolic) is the pressure inside your arteries when your heart relaxes. Ideally you want your blood pressure below 120/80. °Hypertension forces your heart to work harder to pump blood. Your arteries may become narrow or stiff. Having hypertension puts you at risk for heart disease, stroke, and other problems.  °RISK FACTORS °Some risk factors for high blood pressure are controllable. Others are not.  °Risk factors you cannot control include:  °· Race. You may be at higher risk if you are African American. °· Age. Risk increases with age. °· Gender. Men are at higher risk than women before age 45 years. After age 65, women are at higher risk than men. °Risk factors you can control include: °· Not getting enough exercise or physical activity. °· Being overweight. °· Getting too much fat, sugar, calories, or salt in your diet. °· Drinking too much alcohol. °SIGNS AND SYMPTOMS °Hypertension does not usually cause signs or symptoms. Extremely high blood pressure (hypertensive crisis) may cause headache, anxiety, shortness of breath, and nosebleed. °DIAGNOSIS  °To check if you have hypertension, your health care provider will measure your blood pressure while you are seated, with your arm held at the level of your heart. It should be measured at least twice using the same arm. Certain conditions can cause a difference in blood pressure between your right and left arms. A blood pressure reading that is higher than normal on one occasion does not mean that you need treatment. If one blood pressure reading  is high, ask your health care provider about having it checked again. °TREATMENT  °Treating high blood pressure includes making lifestyle changes and possibly taking medicine. Living a healthy lifestyle can help lower high blood pressure. You may need to change some of your habits. °Lifestyle changes may include: °· Following the DASH diet. This diet is high in fruits, vegetables, and whole grains. It is low in salt, red meat, and added sugars. °· Getting at least 2½ hours of brisk physical activity every week. °· Losing weight if necessary. °· Not smoking. °· Limiting alcoholic beverages. °· Learning ways to reduce stress. ° If lifestyle changes are not enough to get your blood pressure under control, your health care provider may prescribe medicine. You may need to take more than one. Work closely with your health care provider to understand the risks and benefits. °HOME CARE INSTRUCTIONS °· Have your blood pressure rechecked as directed by your health care provider.   °· Take medicines only as directed by your health care provider. Follow the directions carefully. Blood pressure medicines must be taken as prescribed. The medicine does not work as well when you skip doses. Skipping doses also puts you at risk for problems.   °· Do not smoke.   °· Monitor your blood pressure at home as directed by your health care provider.  °SEEK MEDICAL CARE IF:  °· You think you are having a reaction to medicines taken. °· You have recurrent headaches or feel dizzy. °· You have swelling in your ankles. °· You have trouble with your vision. °SEEK IMMEDIATE MEDICAL CARE IF: °· You develop a severe headache or confusion. °·   You have unusual weakness, numbness, or feel faint. °· You have severe chest or abdominal pain. °· You vomit repeatedly. °· You have trouble breathing. °MAKE SURE YOU:  °· Understand these instructions. °· Will watch your condition. °· Will get help right away if you are not doing well or get worse. °Document  Released: 06/06/2005 Document Revised: 10/21/2013 Document Reviewed: 03/29/2013 °[employer]® Patient Information ©2015 [employer]. This information is not intended to replace advice given to you by your health care provider. Make sure you discuss any questions you have with your health care provider. °General Headache Without Cause °A headache is pain or discomfort felt around the head or neck area. The specific cause of a headache may not be found. There are many causes and types of headaches. A few common ones are: °· Tension headaches. °· Migraine headaches. °· Cluster headaches. °· Chronic daily headaches. °HOME CARE INSTRUCTIONS  °· Keep all follow-up appointments with your caregiver or any specialist referral. °· Only take over-the-counter or prescription medicines for pain or discomfort as directed by your caregiver. °· Lie down in a dark, quiet room when you have a headache. °· Keep a headache journal to find out what may trigger your migraine headaches. For example, write down: °· What you eat and drink. °· How much sleep you get. °· Any change to your diet or medicines. °· Try massage or other relaxation techniques. °· Put ice packs or heat on the head and neck. Use these 3 to 4 times per day for 15 to 20 minutes each time, or as needed. °· Limit stress. °· Sit up straight, and do not tense your muscles. °· Quit smoking if you smoke. °· Limit alcohol use. °· Decrease the amount of caffeine you drink, or stop drinking caffeine. °· Eat and sleep on a regular schedule. °· Get 7 to 9 hours of sleep, or as recommended by your caregiver. °· Keep lights dim if bright lights bother you and make your headaches worse. °SEEK MEDICAL CARE IF:  °· You have problems with the medicines you were prescribed. °· Your medicines are not working. °· You have a change from the usual headache. °· You have nausea or vomiting. °SEEK IMMEDIATE MEDICAL CARE IF:  °· Your headache becomes severe. °· You have a fever. °· You have a  stiff neck. °· You have loss of vision. °· You have muscular weakness or loss of muscle control. °· You start losing your balance or have trouble walking. °· You feel faint or pass out. °· You have severe symptoms that are different from your first symptoms. °MAKE SURE YOU:  °· Understand these instructions. °· Will watch your condition. °· Will get help right away if you are not doing well or get worse. °Document Released: 06/06/2005 Document Revised: 08/29/2011 Document Reviewed: 06/22/2011 °[employer]® Patient Information ©2015 [employer]. This information is not intended to replace advice given to you by your health care provider. Make sure you discuss any questions you have with your health care provider. ° °

## 2015-01-14 NOTE — ED Notes (Signed)
Medical Record authorization form sent by [facility], [place] @ [phone].

## 2015-01-14 NOTE — ED Notes (Signed)
BP rechecked due to patient complaining of increasing pain in head

## 2015-01-14 NOTE — ED Notes (Signed)
Pt began having L sided weakness and headache onset 2 days ago. Was seen at [facility] and had various scans completed, was diagnosed with a "stroke". Pt reports waking up 01/13/15 c/o headache, was taken back to hospital and told she was dehydrated and hypertensive. Pt was given IV medication and discharged. Family reports concerns for headache and hypertension. Pt a&ox4, speaking without difficulty.

## 2015-12-12 ENCOUNTER — Other Ambulatory Visit: Payer: Self-pay

## 2015-12-12 ENCOUNTER — Emergency Department (HOSPITAL_COMMUNITY)
Admission: EM | Admit: 2015-12-12 | Discharge: 2015-12-12 | Disposition: A | Discharge status: Home / Self Care | Payer: Medicare Other | Attending: Emergency Medicine | Admitting: Emergency Medicine

## 2015-12-12 ENCOUNTER — Emergency Department (HOSPITAL_COMMUNITY): Payer: Medicare Other

## 2015-12-12 ENCOUNTER — Encounter (HOSPITAL_COMMUNITY): Payer: Self-pay

## 2015-12-12 DIAGNOSIS — R569 Unspecified convulsions: Secondary | ICD-10-CM | POA: Diagnosis present

## 2015-12-12 DIAGNOSIS — F17210 Nicotine dependence, cigarettes, uncomplicated: Secondary | ICD-10-CM | POA: Insufficient documentation

## 2015-12-12 DIAGNOSIS — F419 Anxiety disorder, unspecified: Secondary | ICD-10-CM | POA: Insufficient documentation

## 2015-12-12 DIAGNOSIS — Z8673 Personal history of transient ischemic attack (TIA), and cerebral infarction without residual deficits: Secondary | ICD-10-CM | POA: Insufficient documentation

## 2015-12-12 DIAGNOSIS — I110 Hypertensive heart disease with heart failure: Secondary | ICD-10-CM | POA: Diagnosis not present

## 2015-12-12 DIAGNOSIS — Z91040 Latex allergy status: Secondary | ICD-10-CM | POA: Diagnosis not present

## 2015-12-12 DIAGNOSIS — R0789 Other chest pain: Secondary | ICD-10-CM | POA: Diagnosis not present

## 2015-12-12 DIAGNOSIS — I509 Heart failure, unspecified: Secondary | ICD-10-CM | POA: Diagnosis not present

## 2015-12-12 HISTORY — DX: Panic disorder (episodic paroxysmal anxiety): F41.0

## 2015-12-12 HISTORY — DX: Essential (primary) hypertension: I10

## 2015-12-12 LAB — TROPONIN I: Troponin I: 0.04 ng/mL — ABNORMAL HIGH (ref <0.031)

## 2015-12-12 LAB — CBC
HCT: 41.4 % (ref 36.0–46.0)
Hemoglobin: 13.3 g/dL (ref 12.0–15.0)
MCH: 28.5 pg (ref 26.0–34.0)
MCHC: 32.1 g/dL (ref 30.0–36.0)
MCV: 88.8 fL (ref 78.0–100.0)
Platelets: 204 10*3/uL (ref 150–400)
RBC: 4.66 MIL/uL (ref 3.87–5.11)
RDW: 16 % — ABNORMAL HIGH (ref 11.5–15.5)
WBC: 8.6 10*3/uL (ref 4.0–10.5)

## 2015-12-12 LAB — BASIC METABOLIC PANEL
Anion gap: 7 (ref 5–15)
BUN: 9 mg/dL (ref 6–20)
CO2: 22 mmol/L (ref 22–32)
Calcium: 9 mg/dL (ref 8.9–10.3)
Chloride: 110 mmol/L (ref 101–111)
Creatinine, Ser: 0.64 mg/dL (ref 0.44–1.00)
GFR calc Af Amer: >60 mL/min (ref >60)
GFR calc non Af Amer: >60 mL/min (ref >60)
Glucose, Bld: 101 mg/dL — ABNORMAL HIGH (ref 65–99)
Potassium: 4.1 mmol/L (ref 3.5–5.1)
Sodium: 139 mmol/L (ref 135–145)

## 2015-12-12 MED ORDER — LORAZEPAM 1 MG PO TABS
1.0000 mg | ORAL_TABLET | Freq: Once | ORAL | Status: AC
  Administered 2015-12-12: 1 mg via ORAL
  Filled 2015-12-12: qty 1

## 2015-12-12 MED ORDER — LISINOPRIL 10 MG PO TABS
10.0000 mg | ORAL_TABLET | Freq: Once | ORAL | Status: AC
  Administered 2015-12-12: 10 mg via ORAL
  Filled 2015-12-12: qty 1

## 2015-12-12 MED ORDER — HYDROCHLOROTHIAZIDE 25 MG PO TABS
12.5000 mg | ORAL_TABLET | Freq: Once | ORAL | Status: AC
  Administered 2015-12-12: 12.5 mg via ORAL
  Filled 2015-12-12: qty 1

## 2015-12-12 NOTE — ED Provider Notes (Addendum)
CSN: [id number]     Arrival date & time 12/12/15  1339 History   First MD Initiated Contact with Patient 12/12/15 1347     Chief Complaint  Patient presents with  . Panic Attack     (Consider location/radiation/quality/duration/timing/severity/associated sxs/prior Treatment) The history is provided by the patient.  Patient with hx cva, htn, anxiety, c/o witnessing sibling have seizure shortly pta today.  Ems was called to see, the sister (seizure pt) refuses transport, but then patient was hyperventilating and c/o mid chest pain. Patient denies hx cad. No other recent exertional cp or discomfort. No associated diaphoresis, nv or sob. No cough or uri c/o. No fever or chills. No pleuritic pain.  No change w ntg via ems.  Hx smoking, htn.  No hx dm.  No fam hx premature cad. No other recent cp, denies any exertional cp or any unusual doe or fatigue.          Past Medical History  Diagnosis Date  . Stroke ([place])   . CHF (congestive heart failure) ([place])   . Hypertension   . Panic attacks    History reviewed. No pertinent past surgical history. No family history on file. Social History  Substance Use Topics  . Smoking status: Current Every Day Smoker    Types: Cigarettes  . Smokeless tobacco: None  . Alcohol Use: No   OB History    No data available     Review of Systems  Constitutional: Negative for fever.  HENT: Negative for sore throat.   Eyes: Negative for redness.  Respiratory: Negative for shortness of breath.   Cardiovascular: Positive for chest pain. Negative for palpitations and leg swelling.  Gastrointestinal: Negative for vomiting and abdominal pain.  Genitourinary: Negative for flank pain.  Musculoskeletal: Negative for back pain and neck pain.  Skin: Negative for rash.  Neurological: Negative for headaches.  Hematological: Does not bruise/bleed easily.  Psychiatric/Behavioral: The patient is nervous/anxious.       Allergies  Septra and Latex  Home  Medications   Prior to Admission medications   Medication Sig Start Date End Date Taking? Authorizing Provider  butalbital-acetaminophen-caffeine (FIORICET) 50-325-40 MG per tablet Take 1-2 tablets by mouth every 6 (six) hours as needed for headache. 01/14/15 01/14/16  Kristen N Ward, DO   BP 145/105 mmHg  Pulse 70  Temp(Src) 98.6 F (37 C) (Oral)  Resp 20  SpO2 96% Physical Exam  Constitutional: She is oriented to person, place, and time. She appears well-developed and well-nourished. No distress.  Anxious appearing.   HENT:  Mouth/Throat: Oropharynx is clear and moist.  Eyes: Conjunctivae are normal. No scleral icterus.  Neck: Neck supple. No tracheal deviation present. No thyromegaly present.  Cardiovascular: Normal rate, regular rhythm, normal heart sounds and intact distal pulses.  Exam reveals no gallop and no friction rub.   No murmur heard. Pulmonary/Chest: Effort normal and breath sounds normal. No respiratory distress. She exhibits tenderness.  Abdominal: Soft. Normal appearance and bowel sounds are normal. She exhibits no distension. There is no tenderness.  Musculoskeletal: She exhibits no edema or tenderness.  Neurological: She is alert and oriented to person, place, and time.  Skin: Skin is warm and dry. No rash noted. She is not diaphoretic.  Psychiatric:  Anxious.   Nursing note and vitals reviewed.   ED Course  Procedures (including critical care time) Labs Review    Results for orders placed or performed during the hospital encounter of [id number]  Basic metabolic panel  Result  Value Ref Range   Sodium 139 135 - 145 mmol/L   Potassium 4.1 3.5 - 5.1 mmol/L   Chloride 110 101 - 111 mmol/L   CO2 22 22 - 32 mmol/L   Glucose, Bld 101 (H) 65 - 99 mg/dL   BUN 9 6 - 20 mg/dL   Creatinine, Ser 0.64 0.44 - 1.00 mg/dL   Calcium 9.0 8.9 - 10.3 mg/dL   GFR calc non Af Amer >60 >60 mL/min   GFR calc Af Amer >60 >60 mL/min   Anion gap 7 5 - 15  CBC  Result Value Ref  Range   WBC 8.6 4.0 - 10.5 K/uL   RBC 4.66 3.87 - 5.11 MIL/uL   Hemoglobin 13.3 12.0 - 15.0 g/dL   HCT 41.4 36.0 - 46.0 %   MCV 88.8 78.0 - 100.0 fL   MCH 28.5 26.0 - 34.0 pg   MCHC 32.1 30.0 - 36.0 g/dL   RDW 16.0 (H) 11.5 - 15.5 %   Platelets 204 150 - 400 K/uL  Troponin I  Result Value Ref Range   Troponin I 0.04 (H) <0.031 ng/mL   Dg Chest Port 1 View  12/12/2015  CLINICAL DATA:  left-sided chest pain and SOB after having a panic attack this afternoon. Hx of CHF AND HTN. Pt is a current smoker. EXAM: PORTABLE CHEST 1 VIEW COMPARISON:  11/10/2007 FINDINGS: Normal mediastinum and cardiac silhouette. Normal pulmonary vasculature. No evidence of effusion, infiltrate, or pneumothorax. No acute bony abnormality. IMPRESSION: No acute cardiopulmonary process. Electronically Signed   By: Suzy Bouchard M.D.   On: 12/12/2015 14:42    I have personally reviewed and evaluated these images and lab results as part of my medical decision-making.   EKG Interpretation   Date/Time:  Saturday December 12 2015 13:38:41 EDT Ventricular Rate:  99 PR Interval:    QRS Duration: 115 QT Interval:  381 QTC Calculation: 489 R Axis:   29 Text Interpretation:  Sinus rhythm Incomplete left bundle branch block `no  acute change from prior except rate is faster Confirmed by Ashok Cordia  MD,  Lennette Bihari ([id number]) on 12/12/2015 1:44:08 PM      MDM   Monitor. Ecg. Labs.  Reviewed nursing notes and prior charts for additional history.   Patient indicates on bzd rx  (klonopin) at home, which she takes tid prn, and hasnt taken yet today, appears anxious. Ativan 1 mg po.  Recheck, pt calm and alert. No chest pain. No increased wob.    Patient indicates hx htn, takes lisinopril 20/hctz 12.5, hasnt taken yet today, has adequate at home. Will give dose in ED.    Patient currently denies any chest pain, is drinking fluids, calm, states feels ready for d/c.   Patient also indicates has f/u appt with pcp coming  up.  Discussed need recheck for htn, atypical cp (now resolved).  Return precautions provided.      Lajean Saver, MD 12/12/15 [phone]

## 2015-12-12 NOTE — ED Notes (Signed)
Pt. Is staying with her sister and she observed her sister having a Seizure.  When the paramedics arrived, the pt. With the seizure refused transport , and the pt. Is having chest pain and into her lt. Arm Pt. Took her BP medication prior to coming to ED.   Paramedics gave pt. Nitro SL it was ineffective.  Pt. Is NSR on the monitor.  Pt. Is very sleepy and does not open her eyes when your speaking to her. Skin is p/w/d

## 2015-12-12 NOTE — Discharge Instructions (Signed)
It was our pleasure to provide your ER care today - we hope that you feel better.  Rest. Drink plenty of fluids.  Take your medication/klonopin as need.  Follow up with primary care doctor in the coming week.  Your blood pressure was high today - continue your blood pressure medication, limit salt intake, and follow up with primary care doctor in the coming week for recheck.     Smoking is hazardous to your health - discuss smoking cessation program with your doctor at follow up.   Return to ER if worse, new symptoms, fevers, trouble breathing, persistent/recurrent chest pain, other concern.  You were given medication in the ER - no driving for the next 6 hours.    Nonspecific Chest Pain  Chest pain can be caused by many different conditions. There is always a chance that your pain could be related to something serious, such as a heart attack or a blood clot in your lungs. Chest pain can also be caused by conditions that are not life-threatening. If you have chest pain, it is very important to follow up with your health care provider. CAUSES  Chest pain can be caused by:  Heartburn.  Pneumonia or bronchitis.  Anxiety or stress.  Inflammation around your heart (pericarditis) or lung (pleuritis or pleurisy).  A blood clot in your lung.  A collapsed lung (pneumothorax). It can develop suddenly on its own (spontaneous pneumothorax) or from trauma to the chest.  Shingles infection (varicella-zoster virus).  Heart attack.  Damage to the bones, muscles, and cartilage that make up your chest wall. This can include:  Bruised bones due to injury.  Strained muscles or cartilage due to frequent or repeated coughing or overwork.  Fracture to one or more ribs.  Sore cartilage due to inflammation (costochondritis). RISK FACTORS  Risk factors for chest pain may include:  Activities that increase your risk for trauma or injury to your chest.  Respiratory infections or conditions  that cause frequent coughing.  Medical conditions or overeating that can cause heartburn.  Heart disease or family history of heart disease.  Conditions or health behaviors that increase your risk of developing a blood clot.  Having had chicken pox (varicella zoster). SIGNS AND SYMPTOMS Chest pain can feel like:  Burning or tingling on the surface of your chest or deep in your chest.  Crushing, pressure, aching, or squeezing pain.  Dull or sharp pain that is worse when you move, cough, or take a deep breath.  Pain that is also felt in your back, neck, shoulder, or arm, or pain that spreads to any of these areas. Your chest pain may come and go, or it may stay constant. DIAGNOSIS Lab tests or other studies may be needed to find the cause of your pain. Your health care provider may have you take a test called an ambulatory ECG (electrocardiogram). An ECG records your heartbeat patterns at the time the test is performed. You may also have other tests, such as:  Transthoracic echocardiogram (TTE). During echocardiography, sound waves are used to create a picture of all of the heart structures and to look at how blood flows through your heart.  Transesophageal echocardiogram (TEE).This is a more advanced imaging test that obtains images from inside your body. It allows your health care provider to see your heart in finer detail.  Cardiac monitoring. This allows your health care provider to monitor your heart rate and rhythm in real time.  Holter monitor. This is a portable device  that records your heartbeat and can help to diagnose abnormal heartbeats. It allows your health care provider to track your heart activity for several days, if needed.  Stress tests. These can be done through exercise or by taking medicine that makes your heart beat more quickly.  Blood tests.  Imaging tests. TREATMENT  Your treatment depends on what is causing your chest pain. Treatment may  include:  Medicines. These may include:  Acid blockers for heartburn.  Anti-inflammatory medicine.  Pain medicine for inflammatory conditions.  Antibiotic medicine, if an infection is present.  Medicines to dissolve blood clots.  Medicines to treat coronary artery disease.  Supportive care for conditions that do not require medicines. This may include:  Resting.  Applying heat or cold packs to injured areas.  Limiting activities until pain decreases. HOME CARE INSTRUCTIONS  If you were prescribed an antibiotic medicine, finish it all even if you start to feel better.  Avoid any activities that bring on chest pain.  Do not use any tobacco products, including cigarettes, chewing tobacco, or electronic cigarettes. If you need help quitting, ask your health care provider.  Do not drink alcohol.  Take medicines only as directed by your health care provider.  Keep all follow-up visits as directed by your health care provider. This is important. This includes any further testing if your chest pain does not go away.  If heartburn is the cause for your chest pain, you may be told to keep your head raised (elevated) while sleeping. This reduces the chance that acid will go from your stomach into your esophagus.  Make lifestyle changes as directed by your health care provider. These may include:  Getting regular exercise. Ask your health care provider to suggest some activities that are safe for you.  Eating a heart-healthy diet. A registered dietitian can help you to learn healthy eating options.  Maintaining a healthy weight.  Managing diabetes, if necessary.  Reducing stress. SEEK MEDICAL CARE IF:  Your chest pain does not go away after treatment.  You have a rash with blisters on your chest.  You have a fever. SEEK IMMEDIATE MEDICAL CARE IF:   Your chest pain is worse.  You have an increasing cough, or you cough up blood.  You have severe abdominal pain.  You  have severe weakness.  You faint.  You have chills.  You have sudden, unexplained chest discomfort.  You have sudden, unexplained discomfort in your arms, back, neck, or jaw.  You have shortness of breath at any time.  You suddenly start to sweat, or your skin gets clammy.  You feel nauseous or you vomit.  You suddenly feel light-headed or dizzy.  Your heart begins to beat quickly, or it feels like it is skipping beats. These symptoms may represent a serious problem that is an emergency. Do not wait to see if the symptoms will go away. Get medical help right away. Call your local emergency services (911 in the [place] Do not drive yourself to the hospital.   This information is not intended to replace advice given to you by your health care provider. Make sure you discuss any questions you have with your health care provider.   Document Released: 03/16/2005 Document Revised: 06/27/2014 Document Reviewed: 01/10/2014 Elsevier Interactive Patient Education 2016 [employer] Inc[employer]    Chest Wall Pain Chest wall pain is pain in or around the bones and muscles of your chest. Sometimes, an injury causes this pain. Sometimes, the cause may not be known.  This pain may take several weeks or longer to get better. HOME CARE INSTRUCTIONS  Pay attention to any changes in your symptoms. Take these actions to help with your pain:   Rest as told by your health care provider.   Avoid activities that cause pain. These include any activities that use your chest muscles or your abdominal and side muscles to lift heavy items.   If directed, apply ice to the painful area:  Put ice in a plastic bag.  Place a towel between your skin and the bag.  Leave the ice on for 20 minutes, 2-3 times per day.  Take over-the-counter and prescription medicines only as told by your health care provider.  Do not use tobacco products, including cigarettes, chewing tobacco, and e-cigarettes. If you need help  quitting, ask your health care provider.  Keep all follow-up visits as told by your health care provider. This is important. SEEK MEDICAL CARE IF:  You have a fever.  Your chest pain becomes worse.  You have new symptoms. SEEK IMMEDIATE MEDICAL CARE IF:  You have nausea or vomiting.  You feel sweaty or light-headed.  You have a cough with phlegm (sputum) or you cough up blood.  You develop shortness of breath.   This information is not intended to replace advice given to you by your health care provider. Make sure you discuss any questions you have with your health care provider.   Document Released: 06/06/2005 Document Revised: 02/25/2015 Document Reviewed: 09/01/2014 Elsevier Interactive Patient Education 2016 [employer].   Hypertension Hypertension, commonly called high blood pressure, is when the force of blood pumping through your arteries is too strong. Your arteries are the blood vessels that carry blood from your heart throughout your body. A blood pressure reading consists of a higher number over a lower number, such as 110/72. The higher number (systolic) is the pressure inside your arteries when your heart pumps. The lower number (diastolic) is the pressure inside your arteries when your heart relaxes. Ideally you want your blood pressure below 120/80. Hypertension forces your heart to work harder to pump blood. Your arteries may become narrow or stiff. Having untreated or uncontrolled hypertension can cause heart attack, stroke, kidney disease, and other problems. RISK FACTORS Some risk factors for high blood pressure are controllable. Others are not.  Risk factors you cannot control include:   Race. You may be at higher risk if you are African American.  Age. Risk increases with age.  Gender. Men are at higher risk than women before age 59 years. After age 55, women are at higher risk than men. Risk factors you can control include:  Not getting enough exercise  or physical activity.  Being overweight.  Getting too much fat, sugar, calories, or salt in your diet.  Drinking too much alcohol. SIGNS AND SYMPTOMS Hypertension does not usually cause signs or symptoms. Extremely high blood pressure (hypertensive crisis) may cause headache, anxiety, shortness of breath, and nosebleed. DIAGNOSIS To check if you have hypertension, your health care provider will measure your blood pressure while you are seated, with your arm held at the level of your heart. It should be measured at least twice using the same arm. Certain conditions can cause a difference in blood pressure between your right and left arms. A blood pressure reading that is higher than normal on one occasion does not mean that you need treatment. If it is not clear whether you have high blood pressure, you may be asked to  return on a different day to have your blood pressure checked again. Or, you may be asked to monitor your blood pressure at home for 1 or more weeks. TREATMENT Treating high blood pressure includes making lifestyle changes and possibly taking medicine. Living a healthy lifestyle can help lower high blood pressure. You may need to change some of your habits. Lifestyle changes may include:  Following the DASH diet. This diet is high in fruits, vegetables, and whole grains. It is low in salt, red meat, and added sugars.  Keep your sodium intake below 2,300 mg per day.  Getting at least 30-45 minutes of aerobic exercise at least 4 times per week.  Losing weight if necessary.  Not smoking.  Limiting alcoholic beverages.  Learning ways to reduce stress. Your health care provider may prescribe medicine if lifestyle changes are not enough to get your blood pressure under control, and if one of the following is true:  You are 37-15 years of age and your systolic blood pressure is above 140.  You are 25 years of age or older, and your systolic blood pressure is above 150.  Your  diastolic blood pressure is above 90.  You have diabetes, and your systolic blood pressure is over [id number] or your diastolic blood pressure is over 90.  You have kidney disease and your blood pressure is above 140/90.  You have heart disease and your blood pressure is above 140/90. Your personal target blood pressure may vary depending on your medical conditions, your age, and other factors. HOME CARE INSTRUCTIONS  Have your blood pressure rechecked as directed by your health care provider.   Take medicines only as directed by your health care provider. Follow the directions carefully. Blood pressure medicines must be taken as prescribed. The medicine does not work as well when you skip doses. Skipping doses also puts you at risk for problems.  Do not smoke.   Monitor your blood pressure at home as directed by your health care provider. SEEK MEDICAL CARE IF:   You think you are having a reaction to medicines taken.  You have recurrent headaches or feel dizzy.  You have swelling in your ankles.  You have trouble with your vision. SEEK IMMEDIATE MEDICAL CARE IF:  You develop a severe headache or confusion.  You have unusual weakness, numbness, or feel faint.  You have severe chest or abdominal pain.  You vomit repeatedly.  You have trouble breathing. MAKE SURE YOU:   Understand these instructions.  Will watch your condition.  Will get help right away if you are not doing well or get worse.   This information is not intended to replace advice given to you by your health care provider. Make sure you discuss any questions you have with your health care provider.   Document Released: 06/06/2005 Document Revised: 10/21/2014 Document Reviewed: 03/29/2013 Elsevier Interactive Patient Education 2016 [employer].   Smoking Hazards Smoking cigarettes is extremely bad for your health. Tobacco smoke has over 200 known poisons in it. It contains the poisonous gases nitrogen  oxide and carbon monoxide. There are over 60 chemicals in tobacco smoke that cause cancer. Some of the chemicals found in cigarette smoke include:   Cyanide.   Benzene.   Formaldehyde.   Methanol (wood alcohol).   Acetylene (fuel used in welding torches).   Ammonia.  Even smoking lightly shortens your life expectancy by several years. You can greatly reduce the risk of medical problems for you and your family by stopping  now. Smoking is the most preventable cause of death and disease in our society. Within days of quitting smoking, your circulation improves, you decrease the risk of having a heart attack, and your lung capacity improves. There may be some increased phlegm in the first few days after quitting, and it may take months for your lungs to clear up completely. Quitting for 10 years reduces your risk of developing lung cancer to almost that of a nonsmoker.  WHAT ARE THE RISKS OF SMOKING? Cigarette smokers have an increased risk of many serious medical problems, including:  Lung cancer.   Lung disease (such as pneumonia, bronchitis, and emphysema).   Heart attack and chest pain due to the heart not getting enough oxygen (angina).   Heart disease and peripheral blood vessel disease.   Hypertension.   Stroke.   Oral cancer (cancer of the lip, mouth, or voice box).   Bladder cancer.   Pancreatic cancer.   Cervical cancer.   Pregnancy complications, including premature birth.   Stillbirths and smaller newborn babies, birth defects, and genetic damage to sperm.   Early menopause.   Lower estrogen level for women.   Infertility.   Facial wrinkles.   Blindness.   Increased risk of broken bones (fractures).   Senile dementia.   Stomach ulcers and internal bleeding.   Delayed wound healing and increased risk of complications during surgery. Because of secondhand smoke exposure, children of smokers have an increased risk of the  following:   Sudden infant death syndrome (SIDS).   Respiratory infections.   Lung cancer.   Heart disease.   Ear infections.  WHY IS SMOKING ADDICTIVE? Nicotine is the chemical agent in tobacco that is capable of causing addiction or dependence. When you smoke and inhale, nicotine is absorbed rapidly into the bloodstream through your lungs. Both inhaled and noninhaled nicotine may be addictive.  WHAT ARE THE BENEFITS OF QUITTING?  There are many health benefits to quitting smoking. Some are:   The likelihood of developing cancer and heart disease decreases. Health improvements are seen almost immediately.   Blood pressure, pulse rate, and breathing patterns start returning to normal soon after quitting.   People who quit may see an improvement in their overall quality of life.  HOW DO YOU QUIT SMOKING? Smoking is an addiction with both physical and psychological effects, and longtime habits can be hard to change. Your health care provider can recommend:  Programs and community resources, which may include group support, education, or therapy.  Replacement products, such as patches, gum, and nasal sprays. Use these products only as directed. Do not replace cigarette smoking with electronic cigarettes (commonly called e-cigarettes). The safety of e-cigarettes is unknown, and some may contain harmful chemicals. FOR MORE INFORMATION  American Lung Association: www.lung.org  American Cancer Society: www.cancer.org   This information is not intended to replace advice given to you by your health care provider. Make sure you discuss any questions you have with your health care provider.   Document Released: 07/14/2004 Document Revised: 03/27/2013 Document Reviewed: 11/26/2012 Elsevier Interactive Patient Education 2016 Elsevier Inc.    Panic Attacks Panic attacks are sudden, short-livedsurges of severe anxiety, fear, or discomfort. They may occur for no reason when you are  relaxed, when you are anxious, or when you are sleeping. Panic attacks may occur for a number of reasons:   Healthy people occasionally have panic attacks in extreme, life-threatening situations, such as war or natural disasters. Normal anxiety is a protective mechanism of  the body that helps [place] react to danger (fight or flight response).  Panic attacks are often seen with anxiety disorders, such as panic disorder, social anxiety disorder, generalized anxiety disorder, and phobias. Anxiety disorders cause excessive or uncontrollable anxiety. They may interfere with your relationships or other life activities.  Panic attacks are sometimes seen with other mental illnesses, such as depression and posttraumatic stress disorder.  Certain medical conditions, prescription medicines, and drugs of abuse can cause panic attacks. SYMPTOMS  Panic attacks start suddenly, peak within 20 minutes, and are accompanied by four or more of the following symptoms:  Pounding heart or fast heart rate (palpitations).  Sweating.  Trembling or shaking.  Shortness of breath or feeling smothered.  Feeling choked.  Chest pain or discomfort.  Nausea or strange feeling in your stomach.  Dizziness, light-headedness, or feeling like you will faint.  Chills or hot flushes.  Numbness or tingling in your lips or hands and feet.  Feeling that things are not real or feeling that you are not yourself.  Fear of losing control or going crazy.  Fear of dying. Some of these symptoms can mimic serious medical conditions. For example, you may think you are having a heart attack. Although panic attacks can be very scary, they are not life threatening. DIAGNOSIS  Panic attacks are diagnosed through an assessment by your health care provider. Your health care provider will ask questions about your symptoms, such as where and when they occurred. Your health care provider will also ask about your medical history and use of  alcohol and drugs, including prescription medicines. Your health care provider may order blood tests or other studies to rule out a serious medical condition. Your health care provider may refer you to a mental health professional for further evaluation. TREATMENT   Most healthy people who have one or two panic attacks in an extreme, life-threatening situation will not require treatment.  The treatment for panic attacks associated with anxiety disorders or other mental illness typically involves counseling with a mental health professional, medicine, or a combination of both. Your health care provider will help determine what treatment is best for you.  Panic attacks due to physical illness usually go away with treatment of the illness. If prescription medicine is causing panic attacks, talk with your health care provider about stopping the medicine, decreasing the dose, or substituting another medicine.  Panic attacks due to alcohol or drug abuse go away with abstinence. Some adults need professional help in order to stop drinking or using drugs. HOME CARE INSTRUCTIONS   Take all medicines as directed by your health care provider.   Schedule and attend follow-up visits as directed by your health care provider. It is important to keep all your appointments. SEEK MEDICAL CARE IF:  You are not able to take your medicines as prescribed.  Your symptoms do not improve or get worse. SEEK IMMEDIATE MEDICAL CARE IF:   You experience panic attack symptoms that are different than your usual symptoms.  You have serious thoughts about hurting yourself or others.  You are taking medicine for panic attacks and have a serious side effect. MAKE SURE YOU:  Understand these instructions.  Will watch your condition.  Will get help right away if you are not doing well or get worse.   This information is not intended to replace advice given to you by your health care provider. Make sure you discuss  any questions you have with your health care provider.  Document Released: 06/06/2005 Document Revised: 06/11/2013 Document Reviewed: 01/18/2013 Elsevier Interactive Patient Education [employer].

## 2016-04-21 ENCOUNTER — Encounter (HOSPITAL_COMMUNITY): Payer: Self-pay | Admitting: *Deleted

## 2016-04-21 ENCOUNTER — Inpatient Hospital Stay (HOSPITAL_COMMUNITY)
Admission: AD | Admit: 2016-04-21 | Discharge: 2016-04-21 | Disposition: A | Discharge status: Home / Self Care | Payer: Medicare Other | Source: Ambulatory Visit | Attending: Obstetrics & Gynecology | Admitting: Obstetrics & Gynecology

## 2016-04-21 DIAGNOSIS — Z91040 Latex allergy status: Secondary | ICD-10-CM | POA: Diagnosis not present

## 2016-04-21 DIAGNOSIS — Z8673 Personal history of transient ischemic attack (TIA), and cerebral infarction without residual deficits: Secondary | ICD-10-CM | POA: Insufficient documentation

## 2016-04-21 DIAGNOSIS — I10 Essential (primary) hypertension: Secondary | ICD-10-CM | POA: Diagnosis not present

## 2016-04-21 DIAGNOSIS — R1084 Generalized abdominal pain: Secondary | ICD-10-CM | POA: Diagnosis not present

## 2016-04-21 DIAGNOSIS — N939 Abnormal uterine and vaginal bleeding, unspecified: Secondary | ICD-10-CM

## 2016-04-21 DIAGNOSIS — F17210 Nicotine dependence, cigarettes, uncomplicated: Secondary | ICD-10-CM | POA: Diagnosis not present

## 2016-04-21 DIAGNOSIS — N852 Hypertrophy of uterus: Secondary | ICD-10-CM | POA: Diagnosis not present

## 2016-04-21 DIAGNOSIS — Z882 Allergy status to sulfonamides status: Secondary | ICD-10-CM | POA: Insufficient documentation

## 2016-04-21 DIAGNOSIS — R109 Unspecified abdominal pain: Secondary | ICD-10-CM | POA: Diagnosis present

## 2016-04-21 LAB — CBC
HCT: 40.5 % (ref 36.0–46.0)
Hemoglobin: 13.9 g/dL (ref 12.0–15.0)
MCH: 29.3 pg (ref 26.0–34.0)
MCHC: 34.3 g/dL (ref 30.0–36.0)
MCV: 85.4 fL (ref 78.0–100.0)
Platelets: 292 10*3/uL (ref 150–400)
RBC: 4.74 MIL/uL (ref 3.87–5.11)
RDW: 15.4 % (ref 11.5–15.5)
WBC: 10.1 10*3/uL (ref 4.0–10.5)

## 2016-04-21 LAB — URINALYSIS, ROUTINE W REFLEX MICROSCOPIC
Glucose, UA: 100 mg/dL — AB
Ketones, ur: 15 mg/dL — AB
Nitrite: POSITIVE — AB
Protein, ur: >300 mg/dL — AB
Specific Gravity, Urine: 1.025 (ref 1.005–1.030)
pH: 5 (ref 5.0–8.0)

## 2016-04-21 LAB — POCT PREGNANCY, URINE: Preg Test, Ur: NEGATIVE

## 2016-04-21 LAB — URINE MICROSCOPIC-ADD ON

## 2016-04-21 MED ORDER — OXYCODONE-ACETAMINOPHEN 5-325 MG PO TABS: 1.0000 | ORAL_TABLET | Freq: Four times a day (QID) | ORAL | 0 refills | Status: DC | PRN

## 2016-04-21 MED ORDER — OXYCODONE-ACETAMINOPHEN 5-325 MG PO TABS
1.0000 | ORAL_TABLET | Freq: Four times a day (QID) | ORAL | Status: DC | PRN
Start: 2016-04-21 — End: 2016-04-21
  Administered 2016-04-21: 2 via ORAL
  Filled 2016-04-21: qty 2

## 2016-04-21 MED ORDER — MEGESTROL ACETATE 40 MG PO TABS
40.0000 mg | ORAL_TABLET | Freq: Two times a day (BID) | ORAL | 0 refills | Status: DC
Start: 2016-04-21 — End: 2016-04-21

## 2016-04-21 MED ORDER — MEGESTROL ACETATE 40 MG PO TABS: 40.0000 mg | ORAL_TABLET | Freq: Two times a day (BID) | ORAL | 0 refills | Status: DC

## 2016-04-21 NOTE — MAU Note (Signed)
Pt called, not in lobby 

## 2016-04-21 NOTE — MAU Note (Addendum)
Pt states she was dx'd with fibroid tumors, had an ablation 4 years ago.  Received several lupron injections for break through bleeding.  Has been receiving care in [place], [place].  Having severe pain today in mid L abdomen & heavy bleeding.  Was told she may have ovarian cancer.

## 2016-04-21 NOTE — MAU Provider Note (Signed)
History     CSN: [id number]  Arrival date and time: 04/21/16 1440   None     Chief Complaint  Patient presents with  . Abdominal Pain  . Vaginal Bleeding   Non-pregnant female here with complaints of heavy vaginal bleeding and passing clots. She also c/o generalized abdominal pain. She is concerned that her abdomen continues to get bigger every day. The abdominal pain is not a new sx as it started weeks ago but she continues to have it despite using Ibuprofen. She describes the bleeding as dark red and passing fist sized clots at times. She cannot clarify how much bleeding she is having today. She is feeling lightheaded at times. She also states that she cannot eat well as she feels full after ingesting small amounts. She reports being evaluated in [place] for AUB and a palpable LLQ mass in the last 2-3 weeks. She had an ultrasound and labs tests for cancer, and was told by her Gynecologist that she most likely has a malignant ovarian mass and is awaiting referral to [facility]. She was also told that she was anemic and may need a blood transfusion. She reports taking Lupron and Premarin for the AUB and was told bleeding should have stopped about 2 days ago but it has continued. She called her Gynecology office regarding the bleeding and they instructed her to go to the nearest ED. She is visiting her daughter here in [place]. She transitions to different topics frequently, making history collection difficult.    Past Medical History:  Diagnosis Date  . CHF (congestive heart failure) ([place])   . Hypertension   . Panic attacks   . Stroke [facility])     Past Surgical History:  Procedure Laterality Date  . TUBAL LIGATION      History reviewed. No pertinent family history.  Social History  Substance Use Topics  . Smoking status: Current Every Day Smoker    Types: Cigarettes  . Smokeless tobacco: Current User  . Alcohol use No    Allergies:  Allergies  Allergen Reactions  . Latex Rash  .  Septra [Sulfamethoxazole-Trimethoprim] Other (See Comments)    unknown    Prescriptions Prior to Admission  Medication Sig Dispense Refill Last Dose  . aspirin 325 MG tablet Take 325 mg by mouth daily.   12/12/2015 at Unknown time  . citalopram (CELEXA) 10 MG tablet Take 30 mg by mouth daily.   2 12/12/2015 at Unknown time  . clonazePAM (KLONOPIN) 0.5 MG tablet Take 0.5 mg by mouth 3 (three) times daily.  2 12/11/2015 at Unknown time  . lisinopril-hydrochlorothiazide (PRINZIDE,ZESTORETIC) 20-12.5 MG tablet Take 1 tablet by mouth daily.  3 12/12/2015 at Unknown time  . lurasidone (LATUDA) 80 MG TABS tablet Take 80 mg by mouth at bedtime.   12/11/2015 at Unknown time    Review of Systems  Constitutional: Negative.  Negative for chills and fever.  Gastrointestinal: Positive for abdominal pain. Negative for constipation, diarrhea, nausea and vomiting.  Genitourinary: Negative.   Neurological: Negative.    Physical Exam   Blood pressure 145/97, pulse 94, temperature 98.2 F (36.8 C), temperature source Oral, resp. rate 20.  Physical Exam  Constitutional: She is oriented to person, place, and time. She appears well-developed and well-nourished. No distress.  HENT:  Head: Normocephalic and atraumatic.  Neck: Normal range of motion. Neck supple.  Cardiovascular: Normal rate.   Respiratory: Effort normal.  GI: Soft. She exhibits distension (mildly) and mass. There is no hepatosplenomegaly. There  is generalized tenderness. There is no rigidity, no rebound and no guarding.    Genitourinary:  Genitourinary Comments: External: no lesions Vagina: rugated, parous/ nulli, small dark bloody discharge with small clots, no active bleeding from os Uterus: enlarged to 20 wk size, anteverted, tender, no CMT Adnexae: no masses, no tenderness left, no tenderness right   Musculoskeletal: Normal range of motion.  Neurological: She is alert and oriented to person, place, and time.  Skin: Skin is warm and  dry.  Psychiatric: Her speech is normal and behavior is normal. Her mood appears anxious.   Results for orders placed or performed during the hospital encounter of 04/21/16 (from the past 48 hour(s))  Urinalysis, Routine w reflex microscopic (not at [facility])     Status: Abnormal   Collection Time: 04/21/16  3:26 PM  Result Value Ref Range   Color, Urine RED (A) YELLOW    Comment: BIOCHEMICALS MAY BE AFFECTED BY COLOR   APPearance CLOUDY (A) CLEAR   Specific Gravity, Urine 1.025 1.005 - 1.030   pH 5.0 5.0 - 8.0   Glucose, UA 100 (A) NEGATIVE mg/dL   Hgb urine dipstick LARGE (A) NEGATIVE   Bilirubin Urine SMALL (A) NEGATIVE   Ketones, ur 15 (A) NEGATIVE mg/dL   Protein, ur >300 (A) NEGATIVE mg/dL   Nitrite POSITIVE (A) NEGATIVE   Leukocytes, UA TRACE (A) NEGATIVE  Urine microscopic-add on     Status: Abnormal   Collection Time: 04/21/16  3:26 PM  Result Value Ref Range   Squamous Epithelial / LPF 0-5 (A) NONE SEEN   WBC, UA 0-5 0 - 5 WBC/hpf   RBC / HPF 6-30 0 - 5 RBC/hpf   Bacteria, UA FEW (A) NONE SEEN   Urine-Other MUCOUS PRESENT     Comment: URINALYSIS PERFORMED ON SUPERNATANT  Pregnancy, urine POC     Status: None   Collection Time: 04/21/16  3:32 PM  Result Value Ref Range   Preg Test, Ur NEGATIVE NEGATIVE    Comment:        THE SENSITIVITY OF THIS METHODOLOGY IS >24 mIU/mL   CBC     Status: None   Collection Time: 04/21/16  5:39 PM  Result Value Ref Range   WBC 10.1 4.0 - 10.5 K/uL   RBC 4.74 3.87 - 5.11 MIL/uL   Hemoglobin 13.9 12.0 - 15.0 g/dL   HCT 40.5 36.0 - 46.0 %   MCV 85.4 78.0 - 100.0 fL   MCH 29.3 26.0 - 34.0 pg   MCHC 34.3 30.0 - 36.0 g/dL   RDW 15.4 11.5 - 15.5 %   Platelets 292 150 - 400 K/uL    MAU Course  Procedures Perococet 2 po x1  MDM Labs ordered and reviewed. No evidence of anemia or hypovolemia. No evidence of acute abdominal or pelvic process. Discussed HPI, clinical findings, and plan with Dr. Harolyn Rutherford.  I do not recommend repeating  imaging today since she had this recently and there is no acute process suspected. I discussed with pt that I recommend she follow up with her Gynecologist in [place] tomorrow regarding her concerns and I could offer her Megace to decrease the bleeding and Percocet for pain management in the interim. She agrees with this plan.   Assessment and Plan   1. Abnormal uterine bleeding (AUB)   2. Enlarged uterus   3. Generalized abdominal pain    Discharge home Follow up with Dr. Modena Nunnery tomorrow    Medication List    STOP taking  these medications   HYDROcodone-acetaminophen 10-325 MG tablet Commonly known as:  NORCO     TAKE these medications   aspirin 81 MG chewable tablet Chew 81 mg by mouth daily.   citalopram 20 MG tablet Commonly known as:  CELEXA Take 20 mg by mouth at bedtime.   citalopram 10 MG tablet Commonly known as:  CELEXA Take 10 mg by mouth daily.   clonazePAM 1 MG tablet Commonly known as:  KLONOPIN Take 1 mg by mouth 3 (three) times daily as needed for anxiety.   ferrous sulfate 325 (65 FE) MG tablet Take 325 mg by mouth 2 (two) times daily with a meal.   ibuprofen 800 MG tablet Commonly known as:  ADVIL,MOTRIN Take 800 mg by mouth every 8 (eight) hours as needed for mild pain or moderate pain.   LATUDA 40 MG Tabs tablet Generic drug:  lurasidone Take 120 mg by mouth at bedtime.   leuprolide 3.75 MG injection Commonly known as:  LUPRON Inject 3.75 mg into the muscle every 28 (twenty-eight) days.   lisinopril-hydrochlorothiazide 20-12.5 MG tablet Commonly known as:  PRINZIDE,ZESTORETIC Take 1 tablet by mouth 2 (two) times daily.   megestrol 40 MG tablet Commonly known as:  MEGACE Take 1 tablet (40 mg total) by mouth 2 (two) times daily.   oxyCODONE-acetaminophen 5-325 MG tablet Commonly known as:  PERCOCET/ROXICET Take 1-2 tablets by mouth every 6 (six) hours as needed for severe pain.       Julianne Handler, CNM 04/21/2016, 5:02 PM

## 2016-04-21 NOTE — Discharge Instructions (Signed)

## 2016-04-25 DIAGNOSIS — C569 Malignant neoplasm of unspecified ovary: Secondary | ICD-10-CM | POA: Insufficient documentation

## 2016-08-15 IMAGING — CT CT HEAD W/O CM
1 series · 16 of 30 positions shown, 20 images · non-contrast
Comparison: 11/11/2007

CLINICAL DATA: Headache and dizziness.  Elevated blood pressure.

EXAM:
CT HEAD WITHOUT CONTRAST
TECHNIQUE: Contiguous axial images were obtained from the base of the skull
through the vertex without intravenous contrast.

[Series 2: head 5.0 h30s · axial · 0.41mm/px · z∈[-135,+10]mm · 16 of 33 slices shown, 20 images]
[im 2/33  brain]
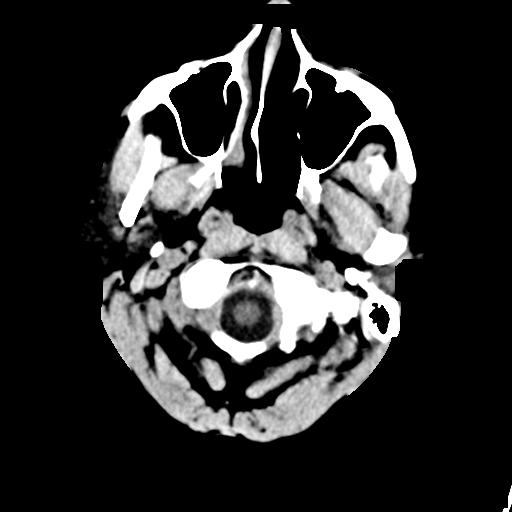
[im 2/33  bone]
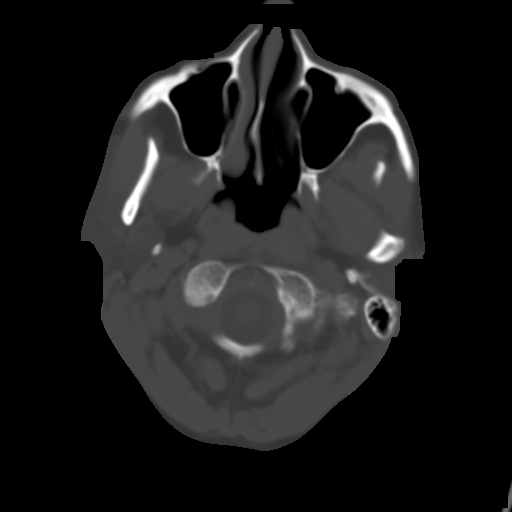
[im 4/33  brain]
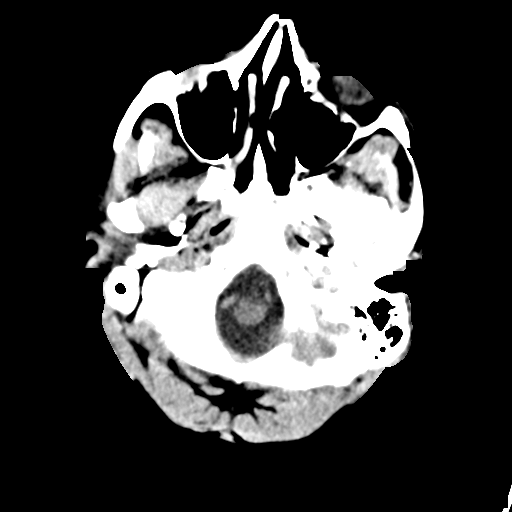
[im 6/33  brain]
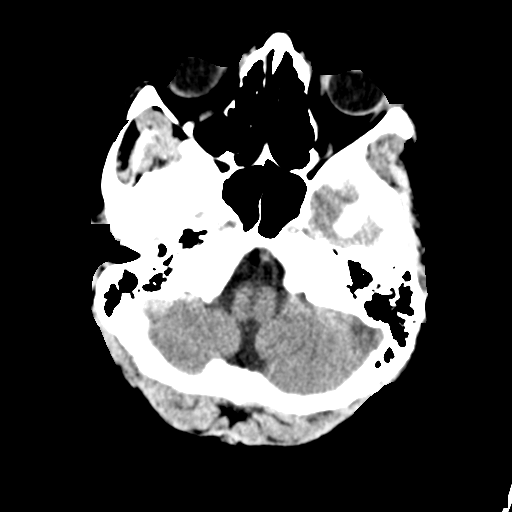
[im 8/33  brain]
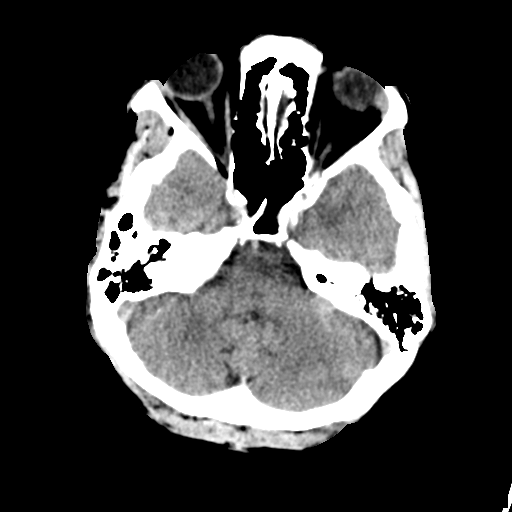
[im 9/33  brain]
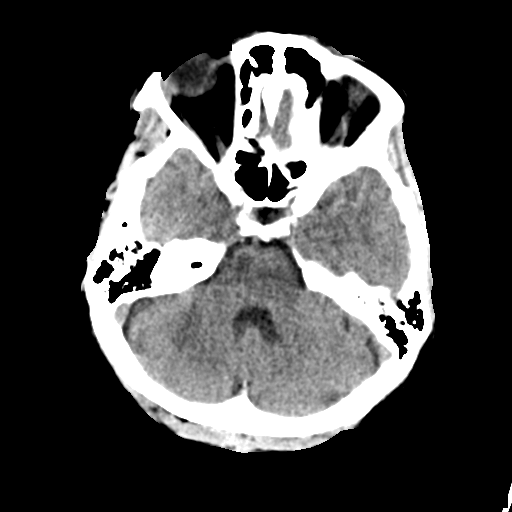
[im 9/33  bone]
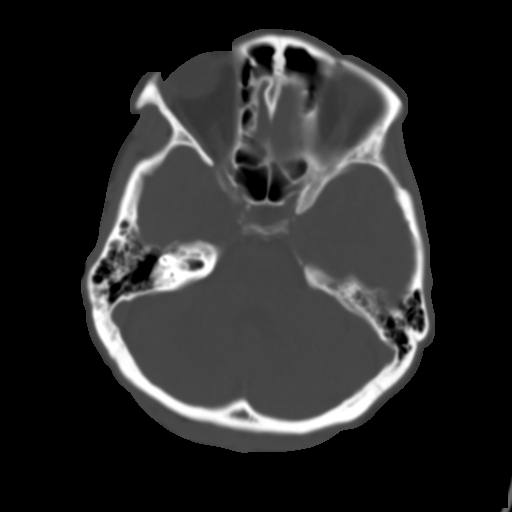
[im 12/33  brain]
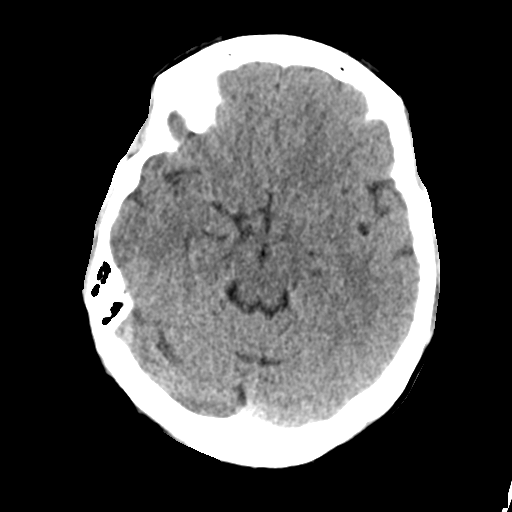
[im 14/33  brain]
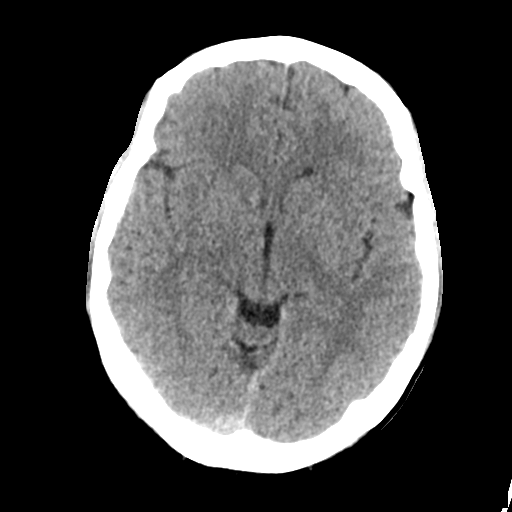
[im 16/33  brain]
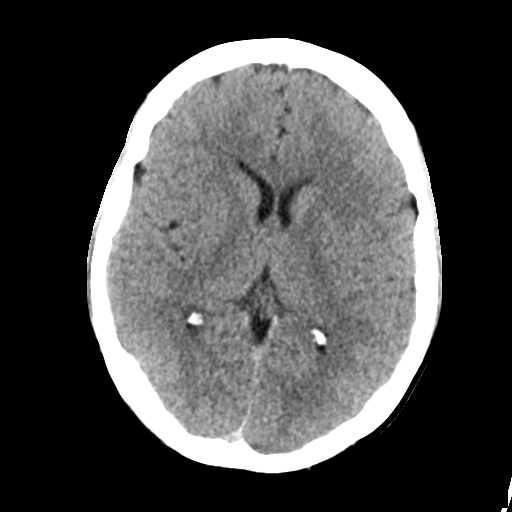
[im 17/33  brain]
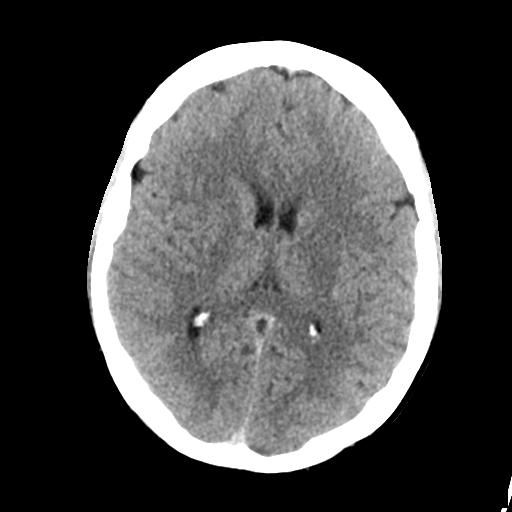
[im 17/33  bone]
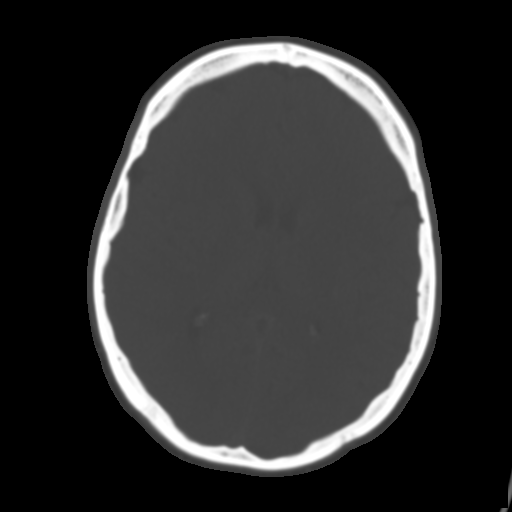
[im 19/33  brain]
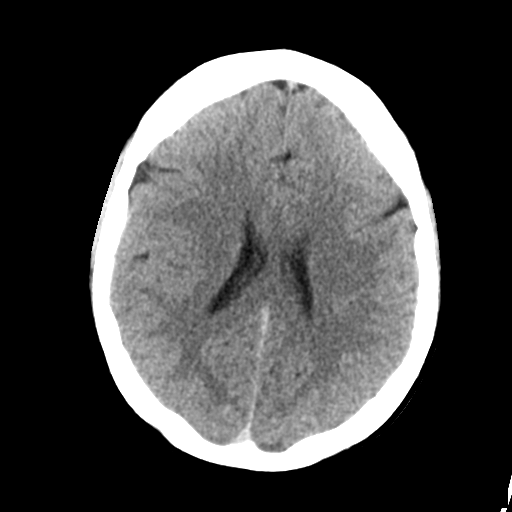
[im 21/33  brain]
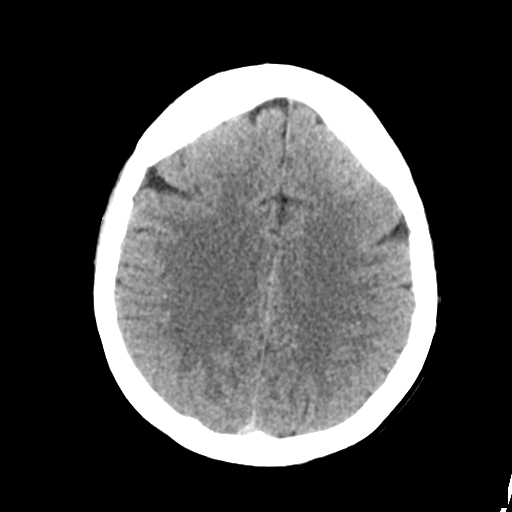
[im 24/33  brain]
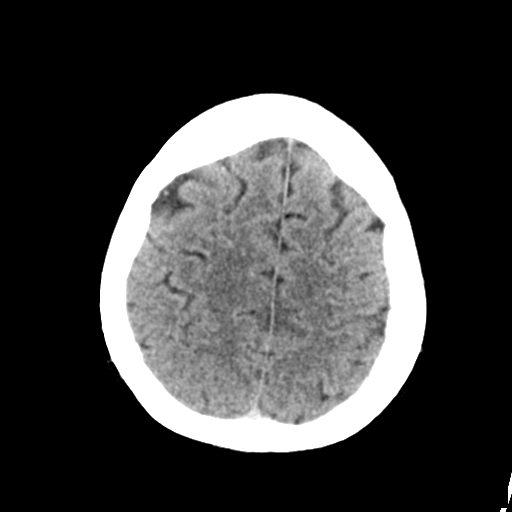
[im 25/33  brain]
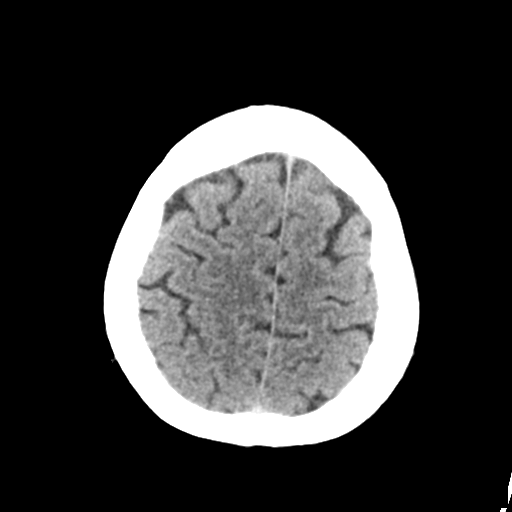
[im 25/33  bone]
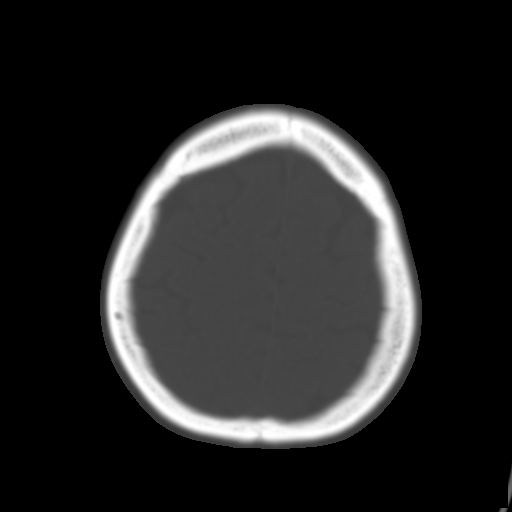
[im 27/33  brain]
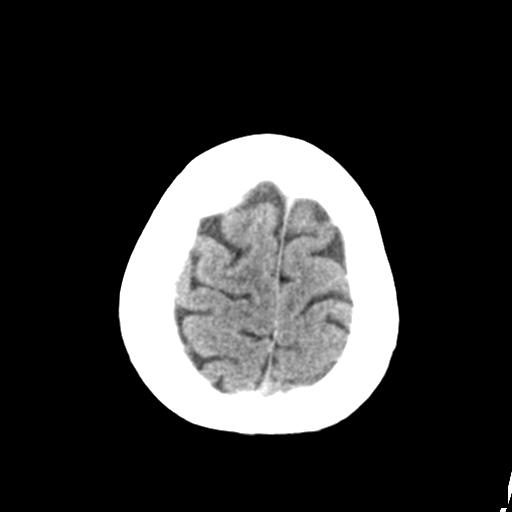
[im 29/33  brain]
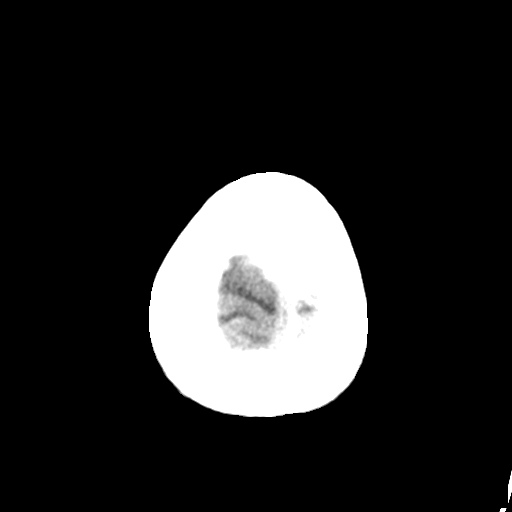
[im 31/33  brain]
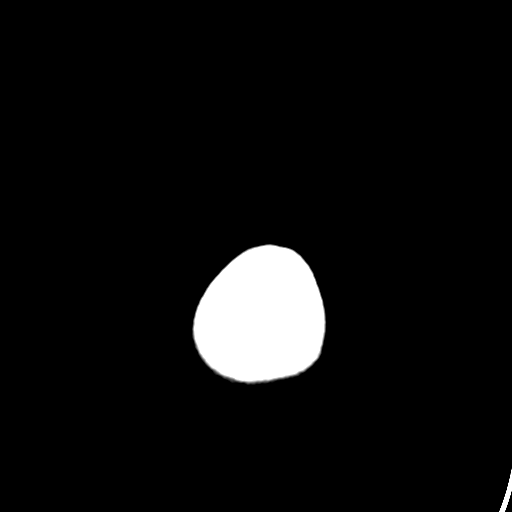

[16 of 30 positions shown; findings below may reference images not displayed]

FINDINGS: No intracranial hemorrhage, mass effect, or midline shift. No
hydrocephalus. The basilar cisterns are patent. No evidence of
territorial infarct. No intracranial fluid collection. Calvarium is
intact. Included paranasal sinuses are well aerated. Scattered
opacification of lower most left mastoid air cells.
IMPRESSION: No acute intracranial abnormality.

## 2016-09-27 ENCOUNTER — Emergency Department (HOSPITAL_COMMUNITY)
Admission: EM | Admit: 2016-09-27 | Discharge: 2016-09-27 | Disposition: A | Discharge status: Home / Self Care | Payer: Medicare Other | Attending: Emergency Medicine | Admitting: Emergency Medicine

## 2016-09-27 ENCOUNTER — Encounter (HOSPITAL_COMMUNITY): Payer: Self-pay | Admitting: Emergency Medicine

## 2016-09-27 DIAGNOSIS — R531 Weakness: Secondary | ICD-10-CM | POA: Diagnosis not present

## 2016-09-27 DIAGNOSIS — I509 Heart failure, unspecified: Secondary | ICD-10-CM | POA: Diagnosis not present

## 2016-09-27 DIAGNOSIS — Z91040 Latex allergy status: Secondary | ICD-10-CM | POA: Diagnosis not present

## 2016-09-27 DIAGNOSIS — Z8543 Personal history of malignant neoplasm of ovary: Secondary | ICD-10-CM | POA: Diagnosis not present

## 2016-09-27 DIAGNOSIS — Z8673 Personal history of transient ischemic attack (TIA), and cerebral infarction without residual deficits: Secondary | ICD-10-CM | POA: Diagnosis not present

## 2016-09-27 DIAGNOSIS — Z79899 Other long term (current) drug therapy: Secondary | ICD-10-CM | POA: Diagnosis not present

## 2016-09-27 DIAGNOSIS — R5383 Other fatigue: Secondary | ICD-10-CM | POA: Diagnosis present

## 2016-09-27 DIAGNOSIS — I110 Hypertensive heart disease with heart failure: Secondary | ICD-10-CM | POA: Diagnosis not present

## 2016-09-27 DIAGNOSIS — F17210 Nicotine dependence, cigarettes, uncomplicated: Secondary | ICD-10-CM | POA: Diagnosis not present

## 2016-09-27 DIAGNOSIS — Z7982 Long term (current) use of aspirin: Secondary | ICD-10-CM | POA: Diagnosis not present

## 2016-09-27 HISTORY — DX: Malignant (primary) neoplasm, unspecified: C80.1

## 2016-09-27 LAB — CBC WITH DIFFERENTIAL/PLATELET
Basophils Absolute: 0 10*3/uL (ref 0.0–0.1)
Basophils Relative: 0 %
Eosinophils Absolute: 0.1 10*3/uL (ref 0.0–0.7)
Eosinophils Relative: 1 %
HCT: 35.7 % — ABNORMAL LOW (ref 36.0–46.0)
Hemoglobin: 11.9 g/dL — ABNORMAL LOW (ref 12.0–15.0)
Lymphocytes Relative: 42 %
Lymphs Abs: 3.3 10*3/uL (ref 0.7–4.0)
MCH: 30.3 pg (ref 26.0–34.0)
MCHC: 33.3 g/dL (ref 30.0–36.0)
MCV: 90.8 fL (ref 78.0–100.0)
Monocytes Absolute: 0.5 10*3/uL (ref 0.1–1.0)
Monocytes Relative: 7 %
Neutro Abs: 3.9 10*3/uL (ref 1.7–7.7)
Neutrophils Relative %: 50 %
Platelets: 254 10*3/uL (ref 150–400)
RBC: 3.93 MIL/uL (ref 3.87–5.11)
RDW: 20.1 % — ABNORMAL HIGH (ref 11.5–15.5)
WBC: 7.7 10*3/uL (ref 4.0–10.5)

## 2016-09-27 LAB — COMPREHENSIVE METABOLIC PANEL
ALT: 33 U/L (ref 14–54)
AST: 29 U/L (ref 15–41)
Albumin: 3.8 g/dL (ref 3.5–5.0)
Alkaline Phosphatase: 73 U/L (ref 38–126)
Anion gap: 7 (ref 5–15)
BUN: 11 mg/dL (ref 6–20)
CO2: 23 mmol/L (ref 22–32)
Calcium: 9 mg/dL (ref 8.9–10.3)
Chloride: 106 mmol/L (ref 101–111)
Creatinine, Ser: 0.54 mg/dL (ref 0.44–1.00)
GFR calc Af Amer: >60 mL/min (ref >60)
GFR calc non Af Amer: >60 mL/min (ref >60)
Glucose, Bld: 95 mg/dL (ref 65–99)
Potassium: 3.3 mmol/L — ABNORMAL LOW (ref 3.5–5.1)
Sodium: 136 mmol/L (ref 135–145)
Total Bilirubin: 0.3 mg/dL (ref 0.3–1.2)
Total Protein: 6.6 g/dL (ref 6.5–8.1)

## 2016-09-27 LAB — URINALYSIS, ROUTINE W REFLEX MICROSCOPIC
Bacteria, UA: NONE SEEN
Bilirubin Urine: NEGATIVE
Glucose, UA: NEGATIVE mg/dL
Ketones, ur: NEGATIVE mg/dL
Leukocytes, UA: NEGATIVE
Nitrite: NEGATIVE
Protein, ur: NEGATIVE mg/dL
Specific Gravity, Urine: 1.014 (ref 1.005–1.030)
pH: 5 (ref 5.0–8.0)

## 2016-09-27 MED ORDER — SODIUM CHLORIDE 0.9 % IV SOLN
Freq: Once | INTRAVENOUS | Status: AC
  Administered 2016-09-27: 1000 mL/h via INTRAVENOUS

## 2016-09-27 MED ORDER — POTASSIUM CHLORIDE CRYS ER 20 MEQ PO TBCR
40.0000 meq | EXTENDED_RELEASE_TABLET | Freq: Once | ORAL | Status: AC
  Administered 2016-09-27: 40 meq via ORAL
  Filled 2016-09-27: qty 2

## 2016-09-27 NOTE — ED Provider Notes (Signed)
[place] DEPT Provider Note   CSN: [id number] Arrival date & time: 09/27/16  1236     History   Chief Complaint Chief Complaint  Patient presents with  . Fatigue  . Panic Attack    HPI Heidi Johnson is a 46 y.o. female.  The history is provided by the patient. No language interpreter was used.  Weakness  This is a new problem. The current episode started more than 2 days ago. The problem has been gradually worsening. There was no focality noted. There has been no fever. There were no medications administered prior to arrival.  Pt has a history of ovarian cancer. Pt had resection and is currently receiving chemotherapy.  Pt reports on 4/2 she had an allergic reaction to chemotherapy.  Pt reports she has had increasing fatigue.  Pt reports her Md advised her to come to ED for evaluation of possible dehydration.  Pt reports today she felt like she might pass out.  Pt is being treated in [place].  She is here staying with her daughter.   Past Medical History:  Diagnosis Date  . Cancer ([place])    ovarian cancer  . CHF (congestive heart failure) ([place])   . Hypertension   . Panic attacks   . Stroke [facility])     There are no active problems to display for this patient.   Past Surgical History:  Procedure Laterality Date  . TUBAL LIGATION      OB History    Gravida Para Term Preterm AB Living   4 3     1 3    SAB TAB Ectopic Multiple Live Births                   Home Medications    Prior to Admission medications   Medication Sig Start Date End Date Taking? Authorizing Provider  aspirin 81 MG chewable tablet Chew 81 mg by mouth daily.   Yes Historical Provider, MD  citalopram (CELEXA) 10 MG tablet Take 10 mg by mouth daily.    Yes Historical Provider, MD  citalopram (CELEXA) 20 MG tablet Take 20 mg by mouth at bedtime.   Yes Historical Provider, MD  clonazePAM (KLONOPIN) 1 MG tablet Take 1 mg by mouth 3 (three) times daily as needed for anxiety.   Yes  Historical Provider, MD  dexamethasone (DECADRON) 4 MG tablet Take 1 tablet by mouth at 8 am and 1 tablet at 5pm for 4 days following chemotherapy. 07/18/16  Yes Historical Provider, MD  ibuprofen (ADVIL,MOTRIN) 800 MG tablet Take 800 mg by mouth every 8 (eight) hours as needed for mild pain or moderate pain.   Yes Historical Provider, MD  lisinopril-hydrochlorothiazide (PRINZIDE,ZESTORETIC) 20-25 MG tablet Take 1 tablet by mouth daily.  09/23/16  Yes Historical Provider, MD  loperamide (IMODIUM A-D) 2 MG tablet Take 2 mg by mouth 4 (four) times daily as needed for diarrhea or loose stools.   Yes Historical Provider, MD  lurasidone (LATUDA) 40 MG TABS tablet Take 120 mg by mouth daily as needed (bipolar disorder).    Yes Historical Provider, MD  ondansetron (ZOFRAN) 8 MG tablet Take 1 tablet by mouth every 8 hours for 4 days after chemotherapy. 07/18/16  Yes Historical Provider, MD  oxyCODONE (OXY IR/ROXICODONE) 5 MG immediate release tablet Take 1 tablet by mouth every 8 hours as needed for pain.  Earliest Fill Date: 09/20/16 09/20/16  Yes Historical Provider, MD  prochlorperazine (COMPAZINE) 10 MG tablet Take 10 mg by mouth  every 6 (six) hours as needed for nausea or vomiting.   Yes Historical Provider, MD  leuprolide (LUPRON) 3.75 MG injection Inject 3.75 mg into the muscle every 28 (twenty-eight) days.    Historical Provider, MD  megestrol (MEGACE) 40 MG tablet Take 1 tablet (40 mg total) by mouth 2 (two) times daily. Patient not taking: Reported on 09/27/2016 04/21/16   Julianne Handler, CNM  oxyCODONE-acetaminophen (PERCOCET/ROXICET) 5-325 MG tablet Take 1-2 tablets by mouth every 6 (six) hours as needed for severe pain. Patient not taking: Reported on 09/27/2016 04/21/16   Julianne Handler, CNM    Family History No family history on file.  Social History Social History  Substance Use Topics  . Smoking status: Current Every Day Smoker    Types: Cigarettes  . Smokeless tobacco: Current User  .  Alcohol use No     Allergies   Latex and Septra [sulfamethoxazole-trimethoprim]   Review of Systems Review of Systems  Neurological: Positive for weakness.  All other systems reviewed and are negative.    Physical Exam Updated Vital Signs BP (!) 139/105 (BP Location: Right Arm)   Pulse 100   Temp 98.1 F (36.7 C) (Oral)   Resp 19   Ht 5\' 9"  (1.753 m)   Wt 89.4 kg   SpO2 100%   BMI 29.09 kg/m   Physical Exam  Constitutional: She is oriented to person, place, and time. She appears well-developed and well-nourished.  HENT:  Head: Normocephalic.  Right Ear: External ear normal.  Mouth/Throat: Oropharynx is clear and moist.  Eyes: EOM are normal.  Neck: Normal range of motion.  Cardiovascular: Normal rate and regular rhythm.   Pulmonary/Chest: Effort normal.  Abdominal: She exhibits no distension.  Musculoskeletal: Normal range of motion.  Neurological: She is alert and oriented to person, place, and time.  Skin: Skin is warm.  Psychiatric: She has a normal mood and affect.  Nursing note and vitals reviewed.    ED Treatments / Results  Labs (all labs ordered are listed, but only abnormal results are displayed) Labs Reviewed  CBC WITH DIFFERENTIAL/PLATELET - Abnormal; Notable for the following:       Result Value   Hemoglobin 11.9 (*)    HCT 35.7 (*)    RDW 20.1 (*)    All other components within normal limits  COMPREHENSIVE METABOLIC PANEL - Abnormal; Notable for the following:    Potassium 3.3 (*)    All other components within normal limits  URINALYSIS, ROUTINE W REFLEX MICROSCOPIC - Abnormal; Notable for the following:    Hgb urine dipstick SMALL (*)    Squamous Epithelial / LPF 0-5 (*)    All other components within normal limits    EKG  EKG Interpretation None       Radiology No results found.  Procedures Procedures (including critical care time)  Medications Ordered in ED Medications  0.9 %  sodium chloride infusion (not administered)       Initial Impression / Assessment and Plan / ED Course  I have reviewed the triage vital signs and the nursing notes.  Pertinent labs & imaging results that were available during my care of the patient were reviewed by me and considered in my medical decision making (see chart for details).     Pt given Iv fluids x 1 liter.  Pt advised to follow up with her MD  Final Clinical Impressions(s) / ED Diagnoses   Final diagnoses:  Weakness    New Prescriptions New Prescriptions  No medications on file  An After Visit Summary was printed and given to the patient.    Hollace Kinnier Darfur, PA-C 09/27/16 [place], MD 09/27/16 [phone]

## 2016-09-27 NOTE — Discharge Instructions (Signed)
Drink plenty of fluids.  See your Physician as scheduled for follow up

## 2016-09-27 NOTE — ED Triage Notes (Signed)
Patient has stage 2 ovarian cancer and last chemo was 4/2, where patient had allergic reaction.  Patient states that since Saturday she would get real weak, near panic attacks and have near syncopal episodes.  Patient talked to surgeon who advised patient to increase PO fluids couple days ago. Patient states that she was told today when she talked to the on call nurse to come to ED for possible dehydration.

## 2016-10-05 ENCOUNTER — Telehealth: Payer: Self-pay | Admitting: Certified Nurse Midwife

## 2016-10-05 NOTE — Telephone Encounter (Signed)
Chart opened in error

## 2016-12-12 DIAGNOSIS — G620 Drug-induced polyneuropathy: Secondary | ICD-10-CM | POA: Insufficient documentation

## 2016-12-12 DIAGNOSIS — T451X5A Adverse effect of antineoplastic and immunosuppressive drugs, initial encounter: Secondary | ICD-10-CM | POA: Insufficient documentation

## 2016-12-12 DIAGNOSIS — F32A Depression, unspecified: Secondary | ICD-10-CM | POA: Insufficient documentation

## 2017-10-02 DIAGNOSIS — F1190 Opioid use, unspecified, uncomplicated: Secondary | ICD-10-CM | POA: Insufficient documentation

## 2017-10-02 DIAGNOSIS — Z515 Encounter for palliative care: Secondary | ICD-10-CM | POA: Insufficient documentation

## 2017-10-26 DIAGNOSIS — Z789 Other specified health status: Secondary | ICD-10-CM | POA: Insufficient documentation

## 2017-10-26 DIAGNOSIS — F319 Bipolar disorder, unspecified: Secondary | ICD-10-CM | POA: Insufficient documentation

## 2017-11-20 DIAGNOSIS — I10 Essential (primary) hypertension: Secondary | ICD-10-CM | POA: Insufficient documentation

## 2018-05-29 DIAGNOSIS — I714 Abdominal aortic aneurysm, without rupture, unspecified: Secondary | ICD-10-CM | POA: Insufficient documentation

## 2018-11-02 DIAGNOSIS — R630 Anorexia: Secondary | ICD-10-CM | POA: Insufficient documentation

## 2018-11-05 DIAGNOSIS — G894 Chronic pain syndrome: Secondary | ICD-10-CM | POA: Insufficient documentation

## 2018-11-05 DIAGNOSIS — R634 Abnormal weight loss: Secondary | ICD-10-CM | POA: Insufficient documentation

## 2018-12-03 DIAGNOSIS — R197 Diarrhea, unspecified: Secondary | ICD-10-CM | POA: Insufficient documentation

## 2018-12-03 DIAGNOSIS — G4762 Sleep related leg cramps: Secondary | ICD-10-CM | POA: Insufficient documentation

## 2019-09-02 DIAGNOSIS — R300 Dysuria: Secondary | ICD-10-CM | POA: Insufficient documentation

## 2019-12-03 ENCOUNTER — Telehealth: Payer: Self-pay | Admitting: Oncology

## 2019-12-03 NOTE — Telephone Encounter (Signed)
Heidi Johnson (daughter) called and wants to transfer patient's care to [facility] because it is closer to where she lives. She also said her mother's counts are high and they would like a second opinion on treatment options.  Scheduled appointment with Dr. Alvy Bimler for 12/18/19 at 1 pm.

## 2019-12-17 ENCOUNTER — Encounter: Payer: Self-pay | Admitting: Hematology and Oncology

## 2019-12-17 DIAGNOSIS — C569 Malignant neoplasm of unspecified ovary: Secondary | ICD-10-CM | POA: Insufficient documentation

## 2019-12-18 ENCOUNTER — Encounter: Payer: Self-pay | Admitting: Hematology and Oncology

## 2019-12-18 ENCOUNTER — Inpatient Hospital Stay: Payer: Medicare Other | Attending: Hematology and Oncology | Admitting: Hematology and Oncology

## 2019-12-18 ENCOUNTER — Other Ambulatory Visit: Payer: Self-pay

## 2019-12-18 VITALS — BP 164/106 | HR 72 | Temp 98.3°F | Resp 18 | Ht 69.0 in | Wt 188.4 lb

## 2019-12-18 DIAGNOSIS — R159 Full incontinence of feces: Secondary | ICD-10-CM | POA: Insufficient documentation

## 2019-12-18 DIAGNOSIS — I714 Abdominal aortic aneurysm, without rupture, unspecified: Secondary | ICD-10-CM

## 2019-12-18 DIAGNOSIS — N6320 Unspecified lump in the left breast, unspecified quadrant: Secondary | ICD-10-CM

## 2019-12-18 DIAGNOSIS — Z8673 Personal history of transient ischemic attack (TIA), and cerebral infarction without residual deficits: Secondary | ICD-10-CM

## 2019-12-18 DIAGNOSIS — Z87891 Personal history of nicotine dependence: Secondary | ICD-10-CM | POA: Insufficient documentation

## 2019-12-18 DIAGNOSIS — Z7982 Long term (current) use of aspirin: Secondary | ICD-10-CM | POA: Insufficient documentation

## 2019-12-18 DIAGNOSIS — I110 Hypertensive heart disease with heart failure: Secondary | ICD-10-CM | POA: Diagnosis not present

## 2019-12-18 DIAGNOSIS — C569 Malignant neoplasm of unspecified ovary: Secondary | ICD-10-CM

## 2019-12-18 DIAGNOSIS — F319 Bipolar disorder, unspecified: Secondary | ICD-10-CM

## 2019-12-18 DIAGNOSIS — Z79899 Other long term (current) drug therapy: Secondary | ICD-10-CM | POA: Insufficient documentation

## 2019-12-18 DIAGNOSIS — Z7189 Other specified counseling: Secondary | ICD-10-CM

## 2019-12-18 DIAGNOSIS — I10 Essential (primary) hypertension: Secondary | ICD-10-CM | POA: Diagnosis not present

## 2019-12-18 DIAGNOSIS — G620 Drug-induced polyneuropathy: Secondary | ICD-10-CM | POA: Diagnosis not present

## 2019-12-18 DIAGNOSIS — C786 Secondary malignant neoplasm of retroperitoneum and peritoneum: Secondary | ICD-10-CM | POA: Insufficient documentation

## 2019-12-18 DIAGNOSIS — G894 Chronic pain syndrome: Secondary | ICD-10-CM | POA: Diagnosis not present

## 2019-12-18 DIAGNOSIS — Z9221 Personal history of antineoplastic chemotherapy: Secondary | ICD-10-CM | POA: Insufficient documentation

## 2019-12-18 DIAGNOSIS — I509 Heart failure, unspecified: Secondary | ICD-10-CM | POA: Insufficient documentation

## 2019-12-18 DIAGNOSIS — T451X5A Adverse effect of antineoplastic and immunosuppressive drugs, initial encounter: Secondary | ICD-10-CM

## 2019-12-18 NOTE — Assessment & Plan Note (Signed)
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

## 2019-12-18 NOTE — Assessment & Plan Note (Signed)
We have extensive discussion about goals of care and realistic expectation

## 2019-12-18 NOTE — Assessment & Plan Note (Signed)
She has cardiovascular risk factor due to history of smoking which she has since quit She will continue to get periodic CT imaging for close monitoring and surveillance

## 2019-12-18 NOTE — Assessment & Plan Note (Signed)
I have reviewed extensive outside records I recommend we proceed with repeat baseline imaging study with CT scan of the chest, abdomen and pelvis along with tumor marker and blood work next month Given her history of uncontrolled hypertension and TIA, agreed to stop bevacizumab for now The patient is not interested to return to [facility] or clinical trial Currently, she is not symptomatic from her disease I will see her back next month for further discussion about plan of care

## 2019-12-18 NOTE — Assessment & Plan Note (Signed)
Her blood pressure is grossly elevated today but according to her blood pressure monitoring at home, it is within normal limits She will continue to check her blood pressure twice a day and to continue aggressive blood pressure medication management through her primary care doctor

## 2019-12-18 NOTE — Assessment & Plan Note (Signed)
We discussed chronic pain management She had recent medication refill She is not interested to return back to [place] for medication refill I do not mind taking over her chronic pain management but we discussed narcotic refill policy She is not due for refill until next month I recommend she bring her pill bottles with her in her next visit

## 2019-12-18 NOTE — Assessment & Plan Note (Signed)
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

## 2019-12-18 NOTE — Assessment & Plan Note (Signed)
This was noted on prior imaging She will continue close surveillance mammogram

## 2019-12-18 NOTE — Progress Notes (Signed)
[place] CONSULT NOTE  Patient Care Team: System, Pcp Not In as PCP - General  ASSESSMENT & PLAN:  Ovarian cancer ([place]) I have reviewed extensive outside records I recommend we proceed with repeat baseline imaging study with CT scan of the chest, abdomen and pelvis along with tumor marker and blood work next month Given her history of uncontrolled hypertension and TIA, agreed to stop bevacizumab for now The patient is not interested to return to [facility] or clinical trial Currently, she is not symptomatic from her disease I will see her back next month for further discussion about plan of care  Abdominal aortic aneurysm (AAA) ([place]) She has cardiovascular risk factor due to history of smoking which she has since quit She will continue to get periodic CT imaging for close monitoring and surveillance  Bipolar disorder ([place]) This is stable She will continue to take her medications as prescribed by her psychiatrist  Chronic pain syndrome We discussed chronic pain management She had recent medication refill She is not interested to return back to [place] for medication refill I do not mind taking over her chronic pain management but we discussed narcotic refill policy She is not due for refill until next month I recommend she bring her pill bottles with her in her next visit  Essential hypertension Her blood pressure is grossly elevated today but according to her blood pressure monitoring at home, it is within normal limits She will continue to check her blood pressure twice a day and to continue aggressive blood pressure medication management through her primary care doctor  Fecal incontinence She had extensive evaluation by gastroenterologist According to the patient, this is due to pelvic floor damage since her GYN surgery I recommend physical therapy and rehab  History of TIA (transient ischemic attack) She has no permanent neurological deficit Continue risk  factor modification  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 CT ABDOMEN PELVIS W CONTRAST    Standing Status:   Future    Standing Expiration Date:   12/17/2020    Order Specific Question:   If indicated for the ordered procedure, I authorize the administration of contrast media per Radiology protocol    Answer:   Yes    Order Specific Question:   Preferred imaging location?    Answer:   [facility]    Order Specific Question:   Radiology Contrast Protocol - do NOT remove file path    Answer:   \\[email or web address]    Order Specific Question:   Is patient pregnant?    Answer:   No  . CT CHEST W CONTRAST    Standing Status:   Future    Standing Expiration Date:   12/17/2020    Order Specific Question:   If indicated for the ordered procedure, I authorize the administration of contrast media per Radiology protocol    Answer:   Yes    Order Specific Question:   Preferred imaging location?    Answer:   [facility]    Order Specific Question:   Radiology Contrast Protocol - do NOT remove file path    Answer:   \\[email or web address]    Order  Specific Question:   Is patient pregnant?    Answer:   No  . Comprehensive metabolic panel    Standing Status:   Standing    Number of Occurrences:   33    Standing Expiration Date:   12/17/2020  . CBC with Differential/Platelet    Standing Status:   Standing    Number of Occurrences:   22    Standing Expiration Date:   12/17/2020  . CA 125    Standing Status:   Standing    Number of Occurrences:   11    Standing Expiration Date:    12/17/2020  . Ambulatory referral to Physical Therapy    Referral Priority:   Routine    Referral Type:   Physical Medicine    Referral Reason:   Specialty Services Required    Requested Specialty:   Physical Therapy    Number of Visits Requested:   1    The total time spent in the appointment was 80 minutes encounter with patients including review of chart and various tests results, discussions about plan of care and coordination of care plan   All questions were answered. The patient knows to call the clinic with any problems, questions or concerns. No barriers to learning was detected.  [name], MD 6/30/20213:20 PM  CHIEF COMPLAINTS/PURPOSE OF CONSULTATION:  Ovarian cancer, transfer of care  HISTORY OF PRESENTING ILLNESS:  Heidi Johnson 49 y.o. female is here because of personal request to transfer of care to [facility] She is here accompanied by her daughter, [name] The patient has diagnosis of ovarian cancer with extensive disease diagnosed in 2017 She had numerous imaging studies, surgery, chemotherapy and treatment at [facility] Recently, due to uncontrolled hypertension and TIA, Avastin was placed on hold Due to extensive distance to travel for treatment, the patient requested transfer of care here  I have reviewed her chart and materials related to her cancer extensively and collaborated history with the patient. Summary of oncologic history is as follows: Oncology History Overview Note  Negative genetics at [facility]   Ovarian cancer ([place])  04/25/2016 Imaging   CT confirms 12cm ovarian masses with extensive peritoneal metastases and malignant pleural effusions   05/05/2016 Surgery   s/p ex-lap posterior exenteration, diaphragm stripping, cholecystectomy, omentectomy, appendectomy and optimal tumor debulking on 05/05/16   06/24/2016 - 10/24/2016 Chemotherapy   Started Taxol/Carboplatin. Dose reduced Taxol with cycle 4 for neuropathy, Taxol discontinued cycle 5   10/24/2016 -  08/20/2018 Anti-estrogen oral therapy   started maintenance letrozole   08/20/2018 -  Chemotherapy   The patient had maintenance Avastin, discontinued due to TIA and uncontrolled hypertension    02/04/2019 Tumor Marker   Patient's tumor was tested for the following markers: CA-125 Results of the tumor marker test revealed 167   02/05/2019 Imaging   Outside CT chest  1.  Numerous left predominant calcified pleural metastases and associated small left pleural effusion are unchanged.  2.  Unchanged (3.0 cm) (series 3, image 96) left breast mass    02/07/2019 Imaging   Outside Ct abdomen and pelvis 1. Overall stable small volume peritoneal tumor, as described above.  2. Unchanged infrarenal abdominal aortic aneurysm.    02/22/2019 Tumor Marker   Patient's tumor was tested for the following markers: CA-125 Results of the tumor marker test revealed 164   03/18/2019 Tumor Marker   Patient's tumor was tested for the following markers: CA-125 Results of the tumor marker test revealed 162   04/08/2019 Tumor  Marker   Patient's tumor was tested for the following markers: CA-125 Results of the tumor marker test revealed 164   04/29/2019 Tumor Marker   Patient's tumor was tested for the following markers: CA-125 Results of the tumor marker test revealed 181   05/20/2019 Tumor Marker   Patient's tumor was tested for the following markers: CA-125 Results of the tumor marker test revealed 164   06/10/2019 Tumor Marker   Patient's tumor was tested for the following markers: CA-125 Results of the tumor marker test revealed 152   07/01/2019 Tumor Marker   Patient's tumor was tested for the following markers: CA-125 Results of the tumor marker test revealed 160   07/22/2019 Tumor Marker   Patient's tumor was tested for the following markers: CA-125 Results of the tumor marker test revealed 171   08/12/2019 Tumor Marker   Patient's tumor was tested for the following markers: CA-125 Results of the  tumor marker test revealed 173   09/02/2019 Tumor Marker   Patient's tumor was tested for the following markers: CA-125 Results of the tumor marker test revealed 159   09/30/2019 Tumor Marker   Patient's tumor was tested for the following markers: CA-125 Results of the tumor marker test revealed 168   11/08/2019 Imaging   Outside CT imaging 1. Study was performed at outside institution on 11/08/2019 and presented for review on 11/14/2019.  2. Stable calcified and noncalcified left-sided nodular and plaque-like pleural abnormalities with small chronic left pleural effusion compatible with stable treated pleural metastasis. No new or enlarging lesions.  3. No enlarging thoracic lymph nodes.  4. Redemonstration of left breast mass. Correlation with mammography is suggested if not already performed   12/02/2019 Tumor Marker   Patient's tumor was tested for the following markers: CA-125 Results of the tumor marker test revealed 186   12/18/2019 Cancer Staging   Staging form: Ovary, Fallopian Tube, and Primary Peritoneal Carcinoma, AJCC 8th Edition - Clinical stage from 12/18/2019: FIGO Stage IIIC (ycT3c, cN0, cM0) - Signed by [name], MD on 12/18/2019    She is due for repeat imaging study The patient shared that she is not willing to undergo clinical trial but was forced by her local oncologist to undergo extensive evaluation and possible enrollment in clinical trial She was told that there are no other options available for her The patient have history of severe back pain and after her surgery, have to take chronic pain medicine to manage this along with gabapentin Since chemotherapy, she has severe peripheral neuropathy on both legs, right greater than the left as well as neuropathy affecting the tips of fingers and toes She also have significant damage to her pelvic floor causing incontinence She was seen by gastroenterologist with negative colonoscopy evaluation She also described what  sounds like manometric evaluation and was recommended therapy to improve pelvic function/control With the bevacizumab causing significant hypertension, she went to the emergency department with TIA-like symptom and severe headaches This are improving since her bevacizumab is placed on hold The patient repeatedly say that multiple doctors are attempting to take a piece of her bowel to improve bowel function.  She also repeatedly stated that her cancer was caused by an ablation procedure that she had prior to diagnosis She used to smoke but has not smoked in 18 months She denies alcohol intake She lives by herself in [place], not working due to disability She has 3 children.  Her daughter requests that her care to be given locally here She  did not have any recent weight loss No recent nausea or vomiting  MEDICAL HISTORY:  Past Medical History:  Diagnosis Date  . Cancer ([place])    ovarian cancer  . CHF (congestive heart failure) ([place])   . Hypertension   . Panic attacks   . Stroke [facility])     SURGICAL HISTORY: Past Surgical History:  Procedure Laterality Date  . ABDOMINAL HYSTERECTOMY    . BACK SURGERY    . TUBAL LIGATION      SOCIAL HISTORY: Social History   Socioeconomic History  . Marital status: Divorced    Spouse name: Not on file  . Number of children: 3  . Years of education: Not on file  . Highest education level: Not on file  Occupational History  . Occupation: retired  Tobacco Use  . Smoking status: Former Smoker    Types: Cigarettes    Quit date: 06/20/2018    Years since quitting: 1.4  . Smokeless tobacco: Never Used  Substance and Sexual Activity  . Alcohol use: No  . Drug use: Yes    Types: Marijuana  . Sexual activity: Yes  Other Topics Concern  . Not on file  Social History Narrative   Lives alone   Social Determinants of Health   Financial Resource Strain:   . Difficulty of Paying Living Expenses:   Food Insecurity:   . Worried About Ship broker in the Last Year:   . Arboriculturist in the Last Year:   Transportation Needs:   . Film/video editor (Medical):   Marland Kitchen Lack of Transportation (Non-Medical):   Physical Activity:   . Days of Exercise per Week:   . Minutes of Exercise per Session:   Stress:   . Feeling of Stress :   Social Connections:   . Frequency of Communication with Friends and Family:   . Frequency of Social Gatherings with Friends and Family:   . Attends Religious Services:   . Active Member of Clubs or Organizations:   . Attends Archivist Meetings:   Marland Kitchen Marital Status:   Intimate Partner Violence:   . Fear of Current or Ex-Partner:   . Emotionally Abused:   Marland Kitchen Physically Abused:   . Sexually Abused:     FAMILY HISTORY: Family History  Problem Relation Age of Onset  . Cancer Maternal Aunt        breast ca/GYN    ALLERGIES:  is allergic to latex and septra [sulfamethoxazole-trimethoprim].  MEDICATIONS:  Current Outpatient Medications  Medication Sig Dispense Refill  . acetaminophen (TYLENOL) 325 MG tablet Take 325 mg by mouth 4 (four) times daily.    Marland Kitchen albuterol (VENTOLIN HFA) 108 (90 Base) MCG/ACT inhaler Inhale 2 puffs into the lungs as needed. 2 puffs into lungs every 6 hours as needed for wheezing or SOB    . aspirin 325 MG EC tablet Take 325 mg by mouth daily.    Marland Kitchen aspirin 81 MG chewable tablet Chew 81 mg by mouth daily.    . clonazePAM (KLONOPIN) 1 MG tablet Take 1 mg by mouth 3 (three) times daily as needed for anxiety.    . cyclobenzaprine (FLEXERIL) 5 MG tablet Take 5 mg by mouth 3 (three) times daily as needed.    Marland Kitchen DILAUDID 2 MG tablet Take 2 mg by mouth 4 (four) times daily as needed.    . gabapentin (NEURONTIN) 300 MG capsule Take 300 mg by mouth 3 (three) times daily.    Marland Kitchen  hydrochlorothiazide (HYDRODIURIL) 25 MG tablet Take 25 mg by mouth daily.    Marland Kitchen ibuprofen (ADVIL,MOTRIN) 800 MG tablet Take 800 mg by mouth every 8 (eight) hours as needed for mild pain or moderate  pain.    Marland Kitchen lidocaine-prilocaine (EMLA) cream Apply 1 application topically as needed.    Marland Kitchen lisinopril-hydrochlorothiazide (ZESTORETIC) 20-25 MG tablet Take 1 tablet by mouth 2 (two) times daily.    Marland Kitchen loperamide (IMODIUM A-D) 2 MG tablet Take 2 mg by mouth 4 (four) times daily as needed for diarrhea or loose stools.    Marland Kitchen MARINOL 5 MG capsule Take 5 mg by mouth 3 (three) times daily.    . naloxone (NARCAN) 2 MG/2ML injection Place 2 mg into the nose as needed.    . naproxen (NAPROSYN) 500 MG tablet Take 500 mg by mouth 2 (two) times daily.    Marland Kitchen OLANZapine (ZYPREXA) 10 MG tablet Take 10 mg by mouth at bedtime.    . polyethylene glycol powder (GLYCOLAX/MIRALAX) 17 GM/SCOOP powder Take 17 g by mouth daily.    . prochlorperazine (COMPAZINE) 10 MG tablet Take 10 mg by mouth every 6 (six) hours as needed for nausea or vomiting.    . rosuvastatin (CRESTOR) 40 MG tablet Take 40 mg by mouth daily.     No current facility-administered medications for this visit.    REVIEW OF SYSTEMS:   Constitutional: Denies fevers, chills or abnormal night sweats Eyes: Denies blurriness of vision, double vision or watery eyes Ears, nose, mouth, throat, and face: Denies mucositis or sore throat Respiratory: Denies cough, dyspnea or wheezes Cardiovascular: Denies palpitation, chest discomfort or lower extremity swelling Gastrointestinal:  Denies nausea, heartburn or change in bowel habits Skin: Denies abnormal skin rashes Lymphatics: Denies new lymphadenopathy or easy bruising Behavioral/Psych: Mood is stable, no new changes  All other systems were reviewed with the patient and are negative.  PHYSICAL EXAMINATION: ECOG PERFORMANCE STATUS: 1 - Symptomatic but completely ambulatory  Vitals:   12/18/19 1319  BP: (!) 164/106  Pulse: 72  Resp: 18  Temp: 98.3 F (36.8 C)  SpO2: 99%   Filed Weights   12/18/19 1319  Weight: 188 lb 6.4 oz (85.5 kg)    GENERAL:alert, no distress and comfortable SKIN: skin  color, texture, turgor are normal, no rashes or significant lesions EYES: normal, conjunctiva are pink and non-injected, sclera clear OROPHARYNX:no exudate, no erythema and lips, buccal mucosa, and tongue normal  NECK: supple, thyroid normal size, non-tender, without nodularity LYMPH:  no palpable lymphadenopathy in the cervical, axillary or inguinal LUNGS: clear to auscultation and percussion with normal breathing effort HEART: regular rate & rhythm and no murmurs and no lower extremity edema ABDOMEN:abdomen soft, non-tender and normal bowel sounds.  Noted well-healed surgical scar  musculoskeletal:no cyanosis of digits and no clubbing  PSYCH: alert & oriented x 3 with fluent speech NEURO: no focal motor/sensory deficits  LABORATORY DATA:  I have reviewed the data as listed Lab Results  Component Value Date   WBC 7.7 09/27/2016   HGB 11.9 (L) 09/27/2016   HCT 35.7 (L) 09/27/2016   MCV 90.8 09/27/2016   PLT 254 09/27/2016   No results for input(s): NA, K, CL, CO2, GLUCOSE, BUN, CREATININE, CALCIUM, GFRNONAA, GFRAA, PROT, ALBUMIN, AST, ALT, ALKPHOS, BILITOT, BILIDIR, IBILI in the last 8760 hours.

## 2019-12-18 NOTE — Assessment & Plan Note (Signed)
This is stable She will continue to take her medications as prescribed by her psychiatrist

## 2019-12-18 NOTE — Assessment & Plan Note (Signed)
She has severe grade 2-3 neuropathy She will continue taking pain medicine and gabapentin

## 2019-12-19 ENCOUNTER — Ambulatory Visit: Payer: Medicare Other

## 2019-12-27 ENCOUNTER — Telehealth: Payer: Self-pay | Admitting: Hematology and Oncology

## 2019-12-31 ENCOUNTER — Other Ambulatory Visit: Payer: Medicare Other

## 2019-12-31 ENCOUNTER — Ambulatory Visit (HOSPITAL_COMMUNITY): Payer: Medicare Other

## 2019-12-31 ENCOUNTER — Telehealth: Payer: Self-pay | Admitting: Hematology and Oncology

## 2019-12-31 ENCOUNTER — Inpatient Hospital Stay: Payer: Medicare Other

## 2019-12-31 ENCOUNTER — Telehealth: Payer: Self-pay

## 2019-12-31 NOTE — Telephone Encounter (Signed)
TC from Pt stating that  CT scan was rescheduled from 12/31/19 to 01/08/20 Because of insurance issues. Pt called stated she has another appointment on 01/08/20 so CT scan was rescheduled to 01/09/20. Pt informed of date and time of appointment.

## 2019-12-31 NOTE — Telephone Encounter (Signed)
Scheduled appt per 7/14 sch msg - unable to reach pt - left message with appt date and time

## 2020-01-01 ENCOUNTER — Other Ambulatory Visit: Payer: Self-pay | Admitting: Hematology and Oncology

## 2020-01-01 ENCOUNTER — Telehealth: Payer: Self-pay | Admitting: Oncology

## 2020-01-01 ENCOUNTER — Inpatient Hospital Stay: Payer: Medicare Other | Admitting: Hematology and Oncology

## 2020-01-01 MED ORDER — DILAUDID 2 MG PO TABS: 2.0000 mg | ORAL_TABLET | Freq: Four times a day (QID) | ORAL | 0 refills | Status: DC | PRN

## 2020-01-01 NOTE — Telephone Encounter (Signed)
Called [place] back and advised her that the refill has been sent in.

## 2020-01-01 NOTE — Telephone Encounter (Signed)
Heidi Johnson called and said she will be out of dilaudid on 01/08/20.  She is currently taking it 4 times a day.  She does not see Dr. Alvy Bimler until 01/14/20 because her CT scan was rescheduled and is wondering if Dr. Alvy Bimler would be able to refill it.  She uses the [facility] on [address] in [place].

## 2020-01-01 NOTE — Telephone Encounter (Signed)
I sent 90 tabs refill

## 2020-01-06 ENCOUNTER — Other Ambulatory Visit: Payer: Self-pay | Admitting: Hematology and Oncology

## 2020-01-06 ENCOUNTER — Other Ambulatory Visit: Payer: Self-pay

## 2020-01-06 ENCOUNTER — Telehealth: Payer: Self-pay

## 2020-01-06 NOTE — Telephone Encounter (Signed)
She called and left a message requesting refill on Gabapentin. Called back. She takes Gabapentin 600 mg am, and 600 mg midday and 900 mg pm daily, this is different then the med list. She is will have the pharmacy in [place] send over request.

## 2020-01-07 NOTE — Telephone Encounter (Signed)
done

## 2020-01-08 ENCOUNTER — Ambulatory Visit (HOSPITAL_COMMUNITY): Payer: Medicare Other

## 2020-01-09 ENCOUNTER — Inpatient Hospital Stay: Payer: Medicare Other | Attending: Hematology and Oncology

## 2020-01-09 ENCOUNTER — Ambulatory Visit (HOSPITAL_COMMUNITY)
Admission: RE | Admit: 2020-01-09 | Discharge: 2020-01-09 | Disposition: A | Discharge status: Home / Self Care | Payer: Medicare Other | Source: Ambulatory Visit | Attending: Hematology and Oncology | Admitting: Hematology and Oncology

## 2020-01-09 ENCOUNTER — Other Ambulatory Visit: Payer: Self-pay

## 2020-01-09 ENCOUNTER — Telehealth: Payer: Self-pay | Admitting: Oncology

## 2020-01-09 ENCOUNTER — Inpatient Hospital Stay: Payer: Medicare Other

## 2020-01-09 DIAGNOSIS — J941 Fibrothorax: Secondary | ICD-10-CM | POA: Diagnosis not present

## 2020-01-09 DIAGNOSIS — Z7982 Long term (current) use of aspirin: Secondary | ICD-10-CM | POA: Diagnosis not present

## 2020-01-09 DIAGNOSIS — G894 Chronic pain syndrome: Secondary | ICD-10-CM | POA: Diagnosis not present

## 2020-01-09 DIAGNOSIS — R160 Hepatomegaly, not elsewhere classified: Secondary | ICD-10-CM | POA: Diagnosis not present

## 2020-01-09 DIAGNOSIS — R112 Nausea with vomiting, unspecified: Secondary | ICD-10-CM | POA: Insufficient documentation

## 2020-01-09 DIAGNOSIS — G620 Drug-induced polyneuropathy: Secondary | ICD-10-CM | POA: Diagnosis not present

## 2020-01-09 DIAGNOSIS — C569 Malignant neoplasm of unspecified ovary: Secondary | ICD-10-CM | POA: Diagnosis not present

## 2020-01-09 DIAGNOSIS — C786 Secondary malignant neoplasm of retroperitoneum and peritoneum: Secondary | ICD-10-CM | POA: Diagnosis not present

## 2020-01-09 DIAGNOSIS — I714 Abdominal aortic aneurysm, without rupture, unspecified: Secondary | ICD-10-CM

## 2020-01-09 DIAGNOSIS — Z9221 Personal history of antineoplastic chemotherapy: Secondary | ICD-10-CM | POA: Insufficient documentation

## 2020-01-09 DIAGNOSIS — Z79899 Other long term (current) drug therapy: Secondary | ICD-10-CM | POA: Diagnosis not present

## 2020-01-09 DIAGNOSIS — C782 Secondary malignant neoplasm of pleura: Secondary | ICD-10-CM | POA: Diagnosis not present

## 2020-01-09 DIAGNOSIS — R109 Unspecified abdominal pain: Secondary | ICD-10-CM | POA: Insufficient documentation

## 2020-01-09 DIAGNOSIS — J910 Malignant pleural effusion: Secondary | ICD-10-CM | POA: Diagnosis not present

## 2020-01-09 DIAGNOSIS — N6320 Unspecified lump in the left breast, unspecified quadrant: Secondary | ICD-10-CM | POA: Insufficient documentation

## 2020-01-09 LAB — CMP (CANCER CENTER ONLY)
ALT: 27 U/L (ref 0–44)
AST: 27 U/L (ref 15–41)
Albumin: 4.2 g/dL (ref 3.5–5.0)
Alkaline Phosphatase: 76 U/L (ref 38–126)
Anion gap: 9 (ref 5–15)
BUN: 16 mg/dL (ref 6–20)
CO2: 26 mmol/L (ref 22–32)
Calcium: 9.2 mg/dL (ref 8.9–10.3)
Chloride: 101 mmol/L (ref 98–111)
Creatinine: 0.71 mg/dL (ref 0.44–1.00)
GFR, Est AFR Am: >60 mL/min (ref >60)
GFR, Estimated: >60 mL/min (ref >60)
Glucose, Bld: 83 mg/dL (ref 70–99)
Potassium: 4.1 mmol/L (ref 3.5–5.1)
Sodium: 136 mmol/L (ref 135–145)
Total Bilirubin: 0.5 mg/dL (ref 0.3–1.2)
Total Protein: 7.3 g/dL (ref 6.5–8.1)

## 2020-01-09 LAB — CBC WITH DIFFERENTIAL/PLATELET
Abs Immature Granulocytes: 0.02 10*3/uL (ref 0.00–0.07)
Basophils Absolute: 0.1 10*3/uL (ref 0.0–0.1)
Basophils Relative: 1 %
Eosinophils Absolute: 0.1 10*3/uL (ref 0.0–0.5)
Eosinophils Relative: 2 %
HCT: 43.6 % (ref 36.0–46.0)
Hemoglobin: 14.3 g/dL (ref 12.0–15.0)
Immature Granulocytes: 0 %
Lymphocytes Relative: 28 %
Lymphs Abs: 2.5 10*3/uL (ref 0.7–4.0)
MCH: 29.7 pg (ref 26.0–34.0)
MCHC: 32.8 g/dL (ref 30.0–36.0)
MCV: 90.5 fL (ref 80.0–100.0)
Monocytes Absolute: 0.6 10*3/uL (ref 0.1–1.0)
Monocytes Relative: 7 %
Neutro Abs: 5.4 10*3/uL (ref 1.7–7.7)
Neutrophils Relative %: 62 %
Platelets: 168 10*3/uL (ref 150–400)
RBC: 4.82 MIL/uL (ref 3.87–5.11)
RDW: 15.9 % — ABNORMAL HIGH (ref 11.5–15.5)
WBC: 8.8 10*3/uL (ref 4.0–10.5)
nRBC: 0 % (ref 0.0–0.2)

## 2020-01-09 MED ORDER — HEPARIN SOD (PORK) LOCK FLUSH 100 UNIT/ML IV SOLN
INTRAVENOUS | Status: AC
  Filled 2020-01-09: qty 5

## 2020-01-09 MED ORDER — SODIUM CHLORIDE (PF) 0.9 % IJ SOLN
INTRAMUSCULAR | Status: AC
  Filled 2020-01-09: qty 50

## 2020-01-09 MED ORDER — IOHEXOL 300 MG/ML  SOLN
100.0000 mL | Freq: Once | INTRAMUSCULAR | Status: AC | PRN
  Administered 2020-01-09: 100 mL via INTRAVENOUS

## 2020-01-09 NOTE — Patient Instructions (Signed)

## 2020-01-09 NOTE — Telephone Encounter (Signed)
Heidi Johnson called and said she usually gets a mammogram and ultrasound every 6 months in [place].  She just got a letter that she needs to have it scheduled in August.  Advised her to bring the letter with her to her appointment with Dr. Alvy Bimler on 01/14/20.  She verbalized agreement.

## 2020-01-10 LAB — CA 125: Cancer Antigen (CA) 125: 184 U/mL — ABNORMAL HIGH (ref 0.0–38.1)

## 2020-01-13 ENCOUNTER — Telehealth: Payer: Self-pay | Admitting: Oncology

## 2020-01-13 NOTE — Telephone Encounter (Signed)
Heidi Johnson called and said she is having bad stomach pains and is at the ER in [name].  She said her blood pressure is high as well.  She wanted to let us know in case she is admitted.

## 2020-01-14 ENCOUNTER — Other Ambulatory Visit: Payer: Self-pay

## 2020-01-14 ENCOUNTER — Encounter: Payer: Self-pay | Admitting: Hematology and Oncology

## 2020-01-14 ENCOUNTER — Inpatient Hospital Stay (HOSPITAL_BASED_OUTPATIENT_CLINIC_OR_DEPARTMENT_OTHER): Payer: Medicare Other | Admitting: Hematology and Oncology

## 2020-01-14 DIAGNOSIS — G894 Chronic pain syndrome: Secondary | ICD-10-CM | POA: Diagnosis not present

## 2020-01-14 DIAGNOSIS — I714 Abdominal aortic aneurysm, without rupture, unspecified: Secondary | ICD-10-CM

## 2020-01-14 DIAGNOSIS — N6320 Unspecified lump in the left breast, unspecified quadrant: Secondary | ICD-10-CM | POA: Diagnosis not present

## 2020-01-14 DIAGNOSIS — Z4802 Encounter for removal of sutures: Secondary | ICD-10-CM

## 2020-01-14 DIAGNOSIS — C569 Malignant neoplasm of unspecified ovary: Secondary | ICD-10-CM

## 2020-01-14 DIAGNOSIS — G620 Drug-induced polyneuropathy: Secondary | ICD-10-CM

## 2020-01-14 DIAGNOSIS — T451X5A Adverse effect of antineoplastic and immunosuppressive drugs, initial encounter: Secondary | ICD-10-CM

## 2020-01-14 NOTE — Progress Notes (Signed)
[place] OFFICE PROGRESS NOTE  Patient Care Team: System, Pcp Not In as PCP - General  ASSESSMENT & PLAN:  Ovarian cancer ([facility]idi Johnson) I have reviewed her outside records extensively I have reviewed her blood work and imaging studies with the patient Currently, she is not symptomatic The breast lesion is stable The pleural calcification is stable Comparing with outside report, her peritoneal disease is stable She is not symptomatic Unfortunately, I do not have copies of her outside imaging study I recommend getting outside imaging downloaded into our system I plan to present her case at the next GYN oncology tumor board to determine the next step My preference would be to give her a treatment break and see her back in 6 weeks for further follow-up and monitoring with plan to repeat imaging study again in 3 months She is in agreement with the plan of care  Left breast mass According to measurement of her breast mass on CT imaging dated back to several years ago, this is stable in size I gave her order to get repeat diagnostic mammogram and ultrasound for further evaluation She is not symptomatic  Abdominal aortic aneurysm (AAA) ([place]) We discussed the risk factors that could cause worsening control of aneurysm, including smoking and uncontrolled hypertension I recommend she resume taking rosuvastatin that was prescribed by her local physician  Chronic pain syndrome We discussed chronic pain management She had recent medication refill She is not interested to return back to [place] for medication refill I do not mind taking over her chronic pain management but we discussed narcotic refill policy  Peripheral neuropathy due to chemotherapy [facility]) Her neuropathy is stable She will continue current dose of gabapentin  Encounter for removal of sutures She had a cyst removed from her head cyst approximately a week ago that was benign She requested her sutures to be removed  today I remove her suture with sterile technique No bleeding or complications after the procedure   No orders of the defined types were placed in this encounter.   All questions were answered. The patient knows to call the clinic with any problems, questions or concerns. The total time spent in the appointment was 80 minutes encounter with patients including review of chart and various tests results, discussions about plan of care and coordination of care plan   [name], MD 01/14/2020 4:42 PM  INTERVAL HISTORY: Please see below for problem oriented charting. She returns with her daughter for further follow-up She had multiple questions related to recent blood work, CT imaging, her medication refills, requesting suture to be removed today and to discuss about plan of care I spent a lot of time going through all her medications from her medication box and updated her list I also spent additional time to remove her sutures Yesterday, she went to the emergency department locally because of severe, acute onset of abdominal pain She brought with her some paperwork but no results were included She had apparently CT imaging and blood work done and was told that the findings were stable She was subsequently discharged home after some IV fluids and pain medicine She had a cyst removed from the top of her scalp a week ago by dermatologist She requests orders to get diagnostic mammogram and ultrasound on her left breast She continues to have very loose stool due to pelvic floor dysfunction She has no abdominal pain today Her appetite is stable She had one episode of nausea/vomiting after IV morphine that was given  through the ER but otherwise have no major significant symptoms of nausea  SUMMARY OF ONCOLOGIC HISTORY: Oncology History Overview Note  Negative genetics at [facility]   Ovarian cancer ([place])  04/25/2016 Imaging   CT confirms 12cm ovarian masses with extensive peritoneal metastases and  malignant pleural effusions   05/05/2016 Surgery   s/p ex-lap posterior exenteration, diaphragm stripping, cholecystectomy, omentectomy, appendectomy and optimal tumor debulking on 05/05/16   06/24/2016 - 10/24/2016 Chemotherapy   Started Taxol/Carboplatin. 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  Numerous left predominant calcified pleural metastases and associated small left pleural effusion are unchanged.  2.  Unchanged (3.0 cm) (series 3, image 96) left breast mass    02/07/2019 Imaging   Outside Ct abdomen and pelvis 1. Overall stable small volume peritoneal tumor, as described above.  2. Unchanged infrarenal abdominal aortic aneurysm.    02/22/2019 Tumor Marker   Patient's tumor was tested for the following markers: CA-125 Results of the tumor marker test revealed 164   03/18/2019 Tumor Marker   Patient's tumor was tested for the following markers: CA-125 Results of the tumor marker test revealed 162   04/08/2019 Tumor Marker   Patient's tumor was tested for the following markers: CA-125 Results of the tumor marker test revealed 164   04/29/2019 Tumor Marker   Patient's tumor was tested for the following markers: CA-125 Results of the tumor marker test revealed 181   05/20/2019 Tumor Marker   Patient's tumor was tested for the following markers: CA-125 Results of the tumor marker test revealed 164   06/10/2019 Tumor Marker   Patient's tumor was tested for the following markers: CA-125 Results of the tumor marker test revealed 152   07/01/2019 Tumor Marker    Patient's tumor was tested for the following markers: CA-125 Results of the tumor marker test revealed 160   07/22/2019 Tumor Marker   Patient's tumor was tested for the following markers: CA-125 Results of the tumor marker test revealed 171   08/12/2019 Tumor Marker   Patient's tumor was tested for the following markers: CA-125 Results of the tumor marker test revealed 173   09/02/2019 Tumor Marker   Patient's tumor was tested for the following markers: CA-125 Results of the tumor marker test revealed 159   09/30/2019 Tumor Marker   Patient's tumor was tested for the following markers: CA-125 Results of the tumor marker test revealed 168   11/08/2019 Imaging   Outside CT imaging 1. Study was performed at outside institution on 11/08/2019 and presented for review on 11/14/2019.  2. Stable calcified and noncalcified left-sided nodular and plaque-like pleural abnormalities with small chronic left pleural effusion compatible with stable treated pleural metastasis. No new or enlarging lesions.  3. No enlarging thoracic lymph nodes.  4. Redemonstration of left breast mass. Correlation with mammography is suggested if not already performed   12/02/2019 Tumor Marker   Patient's tumor was tested for the following markers: CA-125 Results of the tumor marker test revealed 186   12/18/2019 Cancer Staging   Staging form: Ovary, Fallopian Tube, and Primary Peritoneal  Carcinoma, AJCC 8th Edition - Clinical stage from 12/18/2019: FIGO Stage IIIC (ycT3c, cN0, cM0) - Signed by [name], MD on 12/18/2019   01/09/2020 Tumor Marker   Patient's tumor was tested for the following markers: CA-125 Results of the tumor marker test revealed 184   01/10/2020 Imaging   1. Treated left pleural and peritoneal metastatic disease. Areas of residual soft tissue prominence in the right anatomic pelvis are indeterminate. Comparison with prior exams would be helpful. 2. Hepatomegaly. Low-attenuation lesions in the liver  are too small to characterize. Comparison with prior exams would be helpful. 3. Small left fibrothorax. 4. Infrarenal Aortic aneurysm NOS (ICD10-I71.9). Recommend followup by ultrasound in 2 years.  5. Aortic atherosclerosis (ICD10-I70.0). Coronary artery calcification.     REVIEW OF SYSTEMS:   Constitutional: Denies fevers, chills or abnormal weight loss Eyes: Denies blurriness of vision Ears, nose, mouth, throat, and face: Denies mucositis or sore throat Respiratory: Denies cough, dyspnea or wheezes Cardiovascular: Denies palpitation, chest discomfort or lower extremity swelling Skin: Denies abnormal skin rashes Lymphatics: Denies new lymphadenopathy or easy bruising Behavioral/Psych: Mood is stable, no new changes  All other systems were reviewed with the patient and are negative.  I have reviewed the past medical history, past surgical history, social history and family history with the patient and they are unchanged from previous note.  ALLERGIES:  is allergic to latex and septra [sulfamethoxazole-trimethoprim].  MEDICATIONS:  Current Outpatient Medications  Medication Sig Dispense Refill  . docusate sodium (COLACE) 100 MG capsule Take 100 mg by mouth 2 (two) times daily.    . ondansetron (ZOFRAN) 8 MG tablet Take 8 mg by mouth every 8 (eight) hours as needed.    . simethicone (MYLICON) 80 MG chewable tablet Chew 80 mg by mouth every 6 (six) hours as needed.    Marland Kitchen acetaminophen (TYLENOL) 325 MG tablet Take 325 mg by mouth 4 (four) times daily.    Marland Kitchen albuterol (VENTOLIN HFA) 108 (90 Base) MCG/ACT inhaler Inhale 2 puffs into the lungs as needed. 2 puffs into lungs every 6 hours as needed for wheezing or SOB    . aspirin 325 MG EC tablet Take 325 mg by mouth daily.    . clonazePAM (KLONOPIN) 1 MG tablet Take 1 mg by mouth 3 (three) times daily as needed for anxiety.    . cyclobenzaprine (FLEXERIL) 5 MG tablet Take 5 mg by mouth 3 (three) times daily as needed.    Marland Kitchen DILAUDID 2 MG  tablet Take 1 tablet (2 mg total) by mouth 4 (four) times daily as needed. 90 tablet 0  . gabapentin (NEURONTIN) 300 MG capsule TAKE 2 CAPSULES IN MORNING, 2 CAPSULES (600 MG) MIDDAY, AND 3 CAPSULES (900 MG) AT BEDTIME. 90 capsule 0  . hydrochlorothiazide (HYDRODIURIL) 25 MG tablet Take 25 mg by mouth daily.    Marland Kitchen ibuprofen (ADVIL,MOTRIN) 800 MG tablet Take 800 mg by mouth every 8 (eight) hours as needed for mild pain or moderate pain.    Marland Kitchen lidocaine-prilocaine (EMLA) cream Apply 1 application topically as needed.    Marland Kitchen lisinopril-hydrochlorothiazide (ZESTORETIC) 20-25 MG tablet Take 1 tablet by mouth 2 (two) times daily.    Marland Kitchen loperamide (IMODIUM A-D) 2 MG tablet Take 2 mg by mouth 4 (four) times daily as needed for diarrhea or loose stools.    Marland Kitchen MARINOL 5 MG capsule Take 5 mg by mouth 3 (three) times daily.    . naloxone (NARCAN) 2 MG/2ML injection Place 2 mg into the nose as  needed.    . naproxen (NAPROSYN) 500 MG tablet Take 500 mg by mouth 2 (two) times daily.    Marland Kitchen OLANZapine (ZYPREXA) 10 MG tablet Take 10 mg by mouth at bedtime.    . rosuvastatin (CRESTOR) 40 MG tablet Take 40 mg by mouth daily.     No current facility-administered medications for this visit.    PHYSICAL EXAMINATION: ECOG PERFORMANCE STATUS: 1 - Symptomatic but completely ambulatory  Vitals:   01/14/20 1437  BP: (!) 121/93  Pulse: 93  Resp: 18  Temp: 97.9 F (36.6 C)  SpO2: 100%   Filed Weights   01/14/20 1437  Weight: 187 lb (84.8 kg)    GENERAL:alert, no distress and comfortable SKIN: skin color, texture, turgor are normal, no rashes or significant lesions.  Suture is removed from her scalp EYES: normal, Conjunctiva are pink and non-injected, sclera clear OROPHARYNX:no exudate, no erythema and lips, buccal mucosa, and tongue normal  NECK: supple, thyroid normal size, non-tender, without nodularity LYMPH:  no palpable lymphadenopathy in the cervical, axillary or inguinal LUNGS: clear to auscultation and  percussion with normal breathing effort HEART: regular rate & rhythm and no murmurs and no lower extremity edema ABDOMEN:abdomen soft, non-tender and normal bowel sounds Musculoskeletal:no cyanosis of digits and no clubbing  NEURO: alert & oriented x 3 with fluent speech, no focal motor/sensory deficits  LABORATORY DATA:  I have reviewed the data as listed    Component Value Date/Time   NA 136 01/09/2020 1404   K 4.1 01/09/2020 1404   CL 101 01/09/2020 1404   CO2 26 01/09/2020 1404   GLUCOSE 83 01/09/2020 1404   BUN 16 01/09/2020 1404   CREATININE 0.71 01/09/2020 1404   CALCIUM 9.2 01/09/2020 1404   PROT 7.3 01/09/2020 1404   ALBUMIN 4.2 01/09/2020 1404   AST 27 01/09/2020 1404   ALT 27 01/09/2020 1404   ALKPHOS 76 01/09/2020 1404   BILITOT 0.5 01/09/2020 1404   GFRNONAA >60 01/09/2020 1404   GFRAA >60 01/09/2020 1404    No results found for: SPEP, UPEP  Lab Results  Component Value Date   WBC 8.8 01/09/2020   NEUTROABS 5.4 01/09/2020   HGB 14.3 01/09/2020   HCT 43.6 01/09/2020   MCV 90.5 01/09/2020   PLT 168 01/09/2020      Chemistry      Component Value Date/Time   NA 136 01/09/2020 1404   K 4.1 01/09/2020 1404   CL 101 01/09/2020 1404   CO2 26 01/09/2020 1404   BUN 16 01/09/2020 1404   CREATININE 0.71 01/09/2020 1404      Component Value Date/Time   CALCIUM 9.2 01/09/2020 1404   ALKPHOS 76 01/09/2020 1404   AST 27 01/09/2020 1404   ALT 27 01/09/2020 1404   BILITOT 0.5 01/09/2020 1404       RADIOGRAPHIC STUDIES: I have reviewed multiple imaging studies with the patient I have personally reviewed the radiological images as listed and agreed with the findings in the report. CT CHEST W CONTRAST  Addendum Date: 01/10/2020   ADDENDUM REPORT: 01/10/2020 10:08 ADDENDUM: There is a 2.0 x 3.1 cm soft tissue mass in the lateral left breast. Correlation with physical exam, mammography and ultrasound is recommended, as clinically indicated. Electronically Signed    By: Lorin Picket M.D.   On: 01/10/2020 10:08   Result Date: 01/10/2020 CLINICAL DATA:  Ovarian cancer, assess treatment response. EXAM: CT CHEST, ABDOMEN, AND PELVIS WITH CONTRAST TECHNIQUE: Multidetector CT imaging of the chest, abdomen  and pelvis was performed following the standard protocol during bolus administration of intravenous contrast. CONTRAST:  129mL OMNIPAQUE IOHEXOL 300 MG/ML  SOLN COMPARISON:  None. FINDINGS: CT CHEST FINDINGS Cardiovascular: Left IJ Port-A-Cath terminates in the low SVC. Atherosclerotic calcification of the aorta and coronary arteries. Heart size normal. No pericardial effusion. Mediastinum/Nodes: Mediastinal lymph nodes are not enlarged by CT size criteria. No hilar or axillary adenopathy. Partially calcified prepericardiac lymph nodes are not enlarged by CT size criteria. Esophagus is grossly unremarkable. Lungs/Pleura: Nodular pleural calcification throughout the left hemithorax with a small amount of loculated pleural fluid at the base of the left hemithorax. Lungs are otherwise clear. No right pleural fluid. Airway is unremarkable. Musculoskeletal: No worrisome lytic or sclerotic lesions. CT ABDOMEN PELVIS FINDINGS Hepatobiliary: 10 mm low-attenuation lesion in the dome of the liver (2/46) and 1.6 x 1.7 cm low-attenuation lesion in the left hepatic lobe (2/57) are difficult to further characterize due to size. Liver measures 19.2 cm. Cholecystectomy. No biliary ductal dilatation. Pancreas: Negative. Spleen: Negative. Adrenals/Urinary Tract: Adrenal glands and right kidney are unremarkable. Subcentimeter low-attenuation lesion in the left kidney is too small to characterize but statistically, a cyst is likely. Ureters are decompressed. Bladder is low in volume. Stomach/Bowel: Stomach, small bowel and colon are unremarkable. Appendix is not readily visualized. Vascular/Lymphatic: Atherosclerotic calcification of the aorta. Infrarenal aorta measures up to 3.5 cm. No  pathologically enlarged lymph nodes. Reproductive: Hysterectomy. Mild soft tissue asymmetry along the right vaginal cuff (2/108). Soft tissue thickening versus right ovary (2/99). Other: Numerous calcified nodules throughout the peritoneum. Asymmetric soft tissue along the superior margin of the right inguinal canal measures 1.8 x 2.5 cm (2/112). Favor a splenule in the left upper quadrant (2/53) over a noncalcified peritoneal implant. As discussed above, there is soft tissue thickening versus right ovarian tissue in the right adnexa (2/99). 7 mm mesenteric nodule (2/78), not enlarged by CT size criteria. No ascites. Musculoskeletal: Degenerative changes in the spine. No worrisome lytic or sclerotic lesions. Slight retrolisthesis L5 on S1 with slight anterolisthesis of L4 on L5. IMPRESSION: 1. Treated left pleural and peritoneal metastatic disease. Areas of residual soft tissue prominence in the right anatomic pelvis are indeterminate. Comparison with prior exams would be helpful. 2. Hepatomegaly. Low-attenuation lesions in the liver are too small to characterize. Comparison with prior exams would be helpful. 3. Small left fibrothorax. 4. Infrarenal Aortic aneurysm NOS (ICD10-I71.9). Recommend followup by ultrasound in 2 years. This recommendation follows ACR consensus guidelines: White Paper of the ACR Incidental Findings Committee II on Vascular Findings. J Am Coll Radiol 2013; 10:789-794. 5. Aortic atherosclerosis (ICD10-I70.0). Coronary artery calcification. Electronically Signed: By: Lorin Picket M.D. On: 01/10/2020 09:54   CT ABDOMEN PELVIS W CONTRAST  Addendum Date: 01/10/2020   ADDENDUM REPORT: 01/10/2020 10:08 ADDENDUM: There is a 2.0 x 3.1 cm soft tissue mass in the lateral left breast. Correlation with physical exam, mammography and ultrasound is recommended, as clinically indicated. Electronically Signed   By: Lorin Picket M.D.   On: 01/10/2020 10:08   Result Date: 01/10/2020 CLINICAL DATA:   Ovarian cancer, assess treatment response. EXAM: CT CHEST, ABDOMEN, AND PELVIS WITH CONTRAST TECHNIQUE: Multidetector CT imaging of the chest, abdomen and pelvis was performed following the standard protocol during bolus administration of intravenous contrast. CONTRAST:  181mL OMNIPAQUE IOHEXOL 300 MG/ML  SOLN COMPARISON:  None. FINDINGS: CT CHEST FINDINGS Cardiovascular: Left IJ Port-A-Cath terminates in the low SVC. Atherosclerotic calcification of the aorta and coronary arteries. Heart size normal.  No pericardial effusion. Mediastinum/Nodes: Mediastinal lymph nodes are not enlarged by CT size criteria. No hilar or axillary adenopathy. Partially calcified prepericardiac lymph nodes are not enlarged by CT size criteria. Esophagus is grossly unremarkable. Lungs/Pleura: Nodular pleural calcification throughout the left hemithorax with a small amount of loculated pleural fluid at the base of the left hemithorax. Lungs are otherwise clear. No right pleural fluid. Airway is unremarkable. Musculoskeletal: No worrisome lytic or sclerotic lesions. CT ABDOMEN PELVIS FINDINGS Hepatobiliary: 10 mm low-attenuation lesion in the dome of the liver (2/46) and 1.6 x 1.7 cm low-attenuation lesion in the left hepatic lobe (2/57) are difficult to further characterize due to size. Liver measures 19.2 cm. Cholecystectomy. No biliary ductal dilatation. Pancreas: Negative. Spleen: Negative. Adrenals/Urinary Tract: Adrenal glands and right kidney are unremarkable. Subcentimeter low-attenuation lesion in the left kidney is too small to characterize but statistically, a cyst is likely. Ureters are decompressed. Bladder is low in volume. Stomach/Bowel: Stomach, small bowel and colon are unremarkable. Appendix is not readily visualized. Vascular/Lymphatic: Atherosclerotic calcification of the aorta. Infrarenal aorta measures up to 3.5 cm. No pathologically enlarged lymph nodes. Reproductive: Hysterectomy. Mild soft tissue asymmetry along the  right vaginal cuff (2/108). Soft tissue thickening versus right ovary (2/99). Other: Numerous calcified nodules throughout the peritoneum. Asymmetric soft tissue along the superior margin of the right inguinal canal measures 1.8 x 2.5 cm (2/112). Favor a splenule in the left upper quadrant (2/53) over a noncalcified peritoneal implant. As discussed above, there is soft tissue thickening versus right ovarian tissue in the right adnexa (2/99). 7 mm mesenteric nodule (2/78), not enlarged by CT size criteria. No ascites. Musculoskeletal: Degenerative changes in the spine. No worrisome lytic or sclerotic lesions. Slight retrolisthesis L5 on S1 with slight anterolisthesis of L4 on L5. IMPRESSION: 1. Treated left pleural and peritoneal metastatic disease. Areas of residual soft tissue prominence in the right anatomic pelvis are indeterminate. Comparison with prior exams would be helpful. 2. Hepatomegaly. Low-attenuation lesions in the liver are too small to characterize. Comparison with prior exams would be helpful. 3. Small left fibrothorax. 4. Infrarenal Aortic aneurysm NOS (ICD10-I71.9). Recommend followup by ultrasound in 2 years. This recommendation follows ACR consensus guidelines: White Paper of the ACR Incidental Findings Committee II on Vascular Findings. J Am Coll Radiol 2013; 10:789-794. 5. Aortic atherosclerosis (ICD10-I70.0). Coronary artery calcification. Electronically Signed: By: Lorin Picket M.D. On: 01/10/2020 09:54

## 2020-01-14 NOTE — Assessment & Plan Note (Signed)
Her neuropathy is stable She will continue current dose of gabapentin

## 2020-01-14 NOTE — Assessment & Plan Note (Signed)
We discussed the risk factors that could cause worsening control of aneurysm, including smoking and uncontrolled hypertension I recommend she resume taking rosuvastatin that was prescribed by her local physician

## 2020-01-14 NOTE — Telephone Encounter (Signed)
Thanks for the update

## 2020-01-14 NOTE — Assessment & Plan Note (Signed)
I have reviewed her outside records extensively I have reviewed her blood work and imaging studies with the patient Currently, she is not symptomatic The breast lesion is stable The pleural calcification is stable Comparing with outside report, her peritoneal disease is stable She is not symptomatic Unfortunately, I do not have copies of her outside imaging study I recommend getting outside imaging downloaded into our system I plan to present her case at the next GYN oncology tumor board to determine the next step My preference would be to give her a treatment break and see her back in 6 weeks for further follow-up and monitoring with plan to repeat imaging study again in 3 months She is in agreement with the plan of care

## 2020-01-14 NOTE — Assessment & Plan Note (Signed)
We discussed chronic pain management She had recent medication refill She is not interested to return back to [place] for medication refill I do not mind taking over her chronic pain management but we discussed narcotic refill policy

## 2020-01-14 NOTE — Assessment & Plan Note (Signed)
According to measurement of her breast mass on CT imaging dated back to several years ago, this is stable in size I gave her order to get repeat diagnostic mammogram and ultrasound for further evaluation She is not symptomatic

## 2020-01-14 NOTE — Assessment & Plan Note (Signed)
She had a cyst removed from her head cyst approximately a week ago that was benign She requested her sutures to be removed today I remove her suture with sterile technique No bleeding or complications after the procedure

## 2020-01-14 NOTE — Telephone Encounter (Signed)
Called Heidi Johnson to see how she is doing.  She said she was not admitted and will will be here for her appointment this afternoon.

## 2020-01-15 ENCOUNTER — Ambulatory Visit
Admission: RE | Admit: 2020-01-15 | Discharge: 2020-01-15 | Disposition: A | Discharge status: Home / Self Care | Payer: Self-pay | Source: Ambulatory Visit | Attending: Hematology and Oncology | Admitting: Hematology and Oncology

## 2020-01-15 ENCOUNTER — Telehealth: Payer: Self-pay | Admitting: Oncology

## 2020-01-15 DIAGNOSIS — C569 Malignant neoplasm of unspecified ovary: Secondary | ICD-10-CM

## 2020-01-15 NOTE — Telephone Encounter (Signed)
Called Heidi Johnson in [place] and requested powershare of CT abd/pelvis from 11/08/19 at [facility] in [place].

## 2020-01-17 ENCOUNTER — Telehealth: Payer: Self-pay | Admitting: Oncology

## 2020-01-17 NOTE — Telephone Encounter (Addendum)
Heidi Johnson called and asked for a refill of gabapentin to be sent to the [facility] pharmacy on [address] in [place].

## 2020-01-20 ENCOUNTER — Other Ambulatory Visit: Payer: Self-pay | Admitting: Hematology and Oncology

## 2020-01-20 MED ORDER — GABAPENTIN 300 MG PO CAPS: ORAL_CAPSULE | ORAL | 4 refills | Status: DC

## 2020-01-20 NOTE — Telephone Encounter (Signed)
Called Kecia and let her know the refill has been sent in.

## 2020-01-20 NOTE — Telephone Encounter (Signed)
done

## 2020-01-27 ENCOUNTER — Other Ambulatory Visit: Payer: Self-pay | Admitting: Oncology

## 2020-01-27 ENCOUNTER — Telehealth: Payer: Self-pay | Admitting: Oncology

## 2020-01-27 ENCOUNTER — Other Ambulatory Visit: Payer: Self-pay | Admitting: Hematology and Oncology

## 2020-01-27 MED ORDER — DILAUDID 2 MG PO TABS: 2.0000 mg | ORAL_TABLET | Freq: Four times a day (QID) | ORAL | 0 refills | Status: DC | PRN

## 2020-01-27 NOTE — Telephone Encounter (Signed)
I sent refill 

## 2020-01-27 NOTE — Telephone Encounter (Signed)
Called Heidi Johnson and notified her that refill has been sent.  She also wanted to let Dr. Alvy Bimler know that her mammogram and ultrasound have been scheduled for 02/07/20.

## 2020-01-27 NOTE — Telephone Encounter (Signed)
Called Heidi Johnson and advised her of GYN Cancer Conference recommendations and plan for observation.  Advised her that one of the schedulers will call her with appointments.  She verbalized agreement and asked for a refill of dilaudid to be sent to the [facility] on [address]  She has 2 days left and is taking it 4 times a day.    She also said she was diagnosed with type B Flu on Friday by her PCP. She is starting to feel a little better.

## 2020-01-27 NOTE — Progress Notes (Signed)
Gynecologic Oncology Multi-Disciplinary Disposition Conference Note  Date of the Conference: 01/27/2020  Patient Name: Heidi Johnson  Primary Medical Oncologist: Dr. Alvy Bimler  Stage/Disposition:  Stage IIIC low grade ovarian cancer. Disposition is to observation for 3 months followed by repeat imaging and tumor markers (CA 125).   This Multidisciplinary conference took place involving physicians from [place], [place], Radiation Oncology, Pathology, Radiology along with the Gynecologic Oncology Nurse Practitioner and RN.  Comprehensive assessment of the patient's malignancy, staging, need for surgery, chemotherapy, radiation therapy, and need for further testing were reviewed. Supportive measures, both inpatient and following discharge were also discussed. The recommended plan of care is documented. Greater than 35 minutes were spent correlating and coordinating this patient's care.

## 2020-01-27 NOTE — Telephone Encounter (Signed)
I got them thanks

## 2020-01-28 ENCOUNTER — Telehealth: Payer: Self-pay | Admitting: Hematology and Oncology

## 2020-01-28 NOTE — Telephone Encounter (Signed)
Scheduled per 8/9 sch msg. Called and spoke with pt, confirmed 9/20 appts

## 2020-01-29 ENCOUNTER — Ambulatory Visit: Payer: Medicare Other | Admitting: Physical Therapy

## 2020-02-05 ENCOUNTER — Encounter: Payer: Self-pay | Admitting: Physical Therapy

## 2020-02-05 ENCOUNTER — Ambulatory Visit: Payer: Medicare Other | Attending: Hematology and Oncology | Admitting: Physical Therapy

## 2020-02-05 ENCOUNTER — Other Ambulatory Visit: Payer: Self-pay

## 2020-02-05 DIAGNOSIS — M6281 Muscle weakness (generalized): Secondary | ICD-10-CM | POA: Diagnosis not present

## 2020-02-05 DIAGNOSIS — R278 Other lack of coordination: Secondary | ICD-10-CM | POA: Diagnosis present

## 2020-02-05 DIAGNOSIS — R252 Cramp and spasm: Secondary | ICD-10-CM | POA: Diagnosis present

## 2020-02-05 NOTE — Therapy (Signed)
[facility] [address] [place], [place], [address] Phone: [phone]   Fax:  [phone]  Physical Therapy Evaluation  Patient Details  Name: Heidi Johnson MRN: [id number] Date of Birth: 1971-01-24 Referring Provider (PT): R15.9 (ICD-10-CM) - Incontinence of feces, unspecified fecal incontinence type   Encounter Date: 02/05/2020   PT End of Session - 02/05/20 1027    Visit Number 1    Date for PT Re-Evaluation 04/29/20    Authorization Type Medicare    Progress Note Due on Visit 10    PT Start Time 0932    PT Stop Time 1015    PT Time Calculation (min) 43 min    Activity Tolerance Patient tolerated treatment well    Behavior During Therapy [facility] for tasks assessed/performed           Past Medical History:  Diagnosis Date  . Cancer ([place])    ovarian cancer  . CHF (congestive heart failure) ([place])   . Hypertension   . Panic attacks   . Stroke [facility])     Past Surgical History:  Procedure Laterality Date  . ABDOMINAL HYSTERECTOMY    . BACK SURGERY    . TUBAL LIGATION      There were no vitals filed for this visit.    Subjective Assessment - 02/05/20 0931    Subjective Pt referred to PT for FI.  Needing to wear diapers since surgery 4 years ago related to cancer.  No signal for having a BM - comes on suddenly.  Incontinent multiple times a day.    Pertinent History ovarian cancer but taking a break from chemo for now, has chemo port, LE neuropathy, lumbar fusion L4-S1, CHF, HTN, Stroke, AAA, 2017 total hysterectomy/omenectomy/rectosigmoid resection, anxiety, depression    Limitations Sitting;Standing;Walking;House hold activities    Currently in Pain? Yes    Pain Score 8     Pain Location Back    Pain Orientation Right;Left    Pain Descriptors / Indicators Spasm;Stabbing;Burning    Pain Type Acute pain;Chronic pain;Surgical pain    Pain Radiating Towards bil LEs neuropathy    Pain Onset More than a  month ago    Pain Frequency Constant    Effect of Pain on Daily Activities social outings              [facility] PT Assessment - 02/05/20 0001      Assessment   Medical Diagnosis [name], MD    Referring Provider (PT) R15.9 (ICD-10-CM) - Incontinence of feces, unspecified fecal incontinence type    Onset Date/Surgical Date --   4 years   Next MD Visit --   02/2020   Prior Therapy yes, 1 pelvic class      Precautions   Precautions None      Restrictions   Weight Bearing Restrictions No      Balance Screen   Has the patient fallen in the past 6 months Yes    How many times? [place] residence    Living Arrangements Alone    Type of [place]      Prior Function   Level of Independence Independent    Vocation On disability      Cognition   Overall Cognitive Status Within Functional Limits for tasks assessed      Observation/Other Assessments   Observations lumbar scar, abdominal scar, fair/good mobility of all scars  Functional Tests   Functional tests Single leg stance      Single Leg Stance   Comments unable to balance >3 sec, knee buckles bil      Posture/Postural Control   Posture/Postural Control Postural limitations    Postural Limitations Increased thoracic kyphosis;Decreased lumbar lordosis;Flexed trunk      ROM / Strength   AROM / PROM / Strength AROM;Strength      AROM   Overall AROM Comments trunk ROM grossly limited all planes 50%, bil hips limited flexion bil 25%      Strength   Overall Strength Deficits    Overall Strength Comments bil LEs 3+/5 throughout, neuropathy and muscle atrophy present      Flexibility   Soft Tissue Assessment /Muscle Length yes    Hamstrings limited 30% bil      Palpation   Palpation comment coccyx pain                      Objective measurements completed on examination: See above findings.     Pelvic Floor Special Questions - 02/05/20 0001     Prior Pelvic/Prostate Exam Yes    Prior Pregnancies Yes    Number of Pregnancies 4    Number of Vaginal Deliveries 3    Currently Sexually Active No    History of sexually transmitted disease No    Urinary Leakage Yes    Activities that cause leaking Coughing    Urinary urgency No    Urinary frequency 5-7    Fecal incontinence Yes    Fluid intake alkaline water 8 glasses a day+    Caffeine beverages no    Falling out feeling (prolapse) Yes    Activities that cause feeling of prolapse after a BM or when constipated    External Perineal Exam PT obtained verbal consent, observed vulvar skin dry, sticky, atrophic    Skin Integrity Intact;Hemorroids   evidence of past hemmorhoids present   Perineal Body/Introitus  Elevated    External Palpation tender throughout    Prolapse None    Pelvic Floor Internal Exam PT explained and obtained verbal consent    Exam Type Vaginal;Rectal    Sensation diminished or absent rectal reflex    Palpation tender levator ani bil    Strength fair squeeze, definite lift                    PT Education - 02/05/20 1017    Education Details bowel log x 3 days, moisturizer with Restore/coconut oil, avoid kegels for now    Person(s) Educated Patient    Methods Explanation    Comprehension Verbalized understanding            PT Short Term Goals - 02/05/20 1043      PT SHORT TERM GOAL #1   Title Pt will be educated about and ind with vulvar skin care.    Baseline start with moisturizer, needs vulvarskincare handout for further care    Time 2    Period Weeks    Status New    Target Date 02/19/20      PT SHORT TERM GOAL #2   Title Pt will fill out 3 day bowel log and review dietary intake ideas for stool consistency    Time 2    Period Weeks    Status New    Target Date 02/19/20      PT SHORT TERM GOAL #3   Title Pt will learn  HEP for gentle mobility and PF stretching    Time 3    Period Weeks    Status New    Target Date 02/26/20       PT SHORT TERM GOAL #4   Title Pt will demo improved excursion of PF contractions from resting to at least 3-4/5 around levator ani for improved bowel training control.    Time 4    Period Weeks    Status New    Target Date 03/04/20             PT Long Term Goals - 02/05/20 1253      PT LONG TERM GOAL #1   Title Pt will be ind with HEP and self-care strategies for pelvic floor dysfunction.    Time 12    Period Weeks    Status New    Target Date 04/29/20      PT LONG TERM GOAL #2   Title Pt will be able to delay defecation and get to toilet for BM at least 50% of the time.    Time 12    Period Weeks    Status New    Target Date 04/29/20      PT LONG TERM GOAL #3   Title Pt will achieve Type 3 or 4 stool on Bristol Stool Chart at least 2 times a week.    Time 12    Period Weeks    Status New    Target Date 04/29/20      PT LONG TERM GOAL #4   Title Pt will reduce FIC-Q score by at least 10 points to demo improved QOL regarding bowel function and control.    Baseline 76/106 (leakage items 66/83, constipation 10/23)    Time 12    Period Weeks    Status New    Target Date 04/29/20      PT LONG TERM GOAL #5   Title Pt will be able to demo improved excursion of PF muscles for full bulge, relax, contract for continence, defecation, and reduced pelvic pain.    Time 12    Period Weeks    Status New    Target Date 04/29/20                  Plan - 02/05/20 1028    Clinical Impression Statement Pt is a pleasant 49yo female with complex medical and surgical history referred to PT for fecal incontinence.  She wears diapers around the clock with mutiple episodes of leaky stool daily.  She has absent signal for BMs.  Bowels tend to by loose Type 6 and 7 on Bristol Stool Scale, but can also be Type 1 and Pt has to manually assist BMs.  She believes there is some food sensitivity that contributes to loose stool.  She has had extensive surgery including lumbar fusion,  abdominal surgery secondary to cancer with total hysterectomy and rectosignmoid resection.  She is currently living with ovarian cancer but is taking a break from chemo.  FI has been present x 4 years since abdominal surgery.  Pt has ongoing bil neuropathy with burning and weakness, LBP, pelvic pain and coccyx pain.  She presents with limited trunk and hip mobility, LE weakness 3+/5 throughout, poor balance in SLS, and LE flexibility restrictions.  She has thin, dry vulvar tissues with atrophic changes.  Evidence of past hemmorhoids is present.  Perineal body rests elevated, and internal assessment both vaginally and rectally revealed fair PF contractions with  resting hypertonus and pain in bil levator ani and pubococcygeus.  PT halted Kegels for now given increased resting tension and pain in PF which need to be released first for ideal training.  PT asked Pt to fill out 3 day bowel and food log to see about patterns and type of dietary intake.  PT gave Restore sample and instructions to use this and/or coconut oil to begin vulvar skin care routine.  Pt will benefit from skilled PT to develop more optimal bowel patterns and control, ideal resting tone of PF, and good excursion and strength of PF used with proper timing to reduce FI episodes.    Personal Factors and Comorbidities Time since onset of injury/illness/exacerbation;Comorbidity 1;Comorbidity 2;Comorbidity 3+;Profession    Comorbidities on disability, ovarian cancer (active), lumbar fusion, bil LE neuropathy, extensive abdominal surgeries for cancer with rectosigmoid resection and total hysterectomy 4 years ago    Examination-Activity Limitations Toileting;Stand;Sit;Bend;Squat;Locomotion Level;Continence    Examination-Participation Restrictions Community Activity;Occupation;Shop;Interpersonal Relationship;Cleaning;Yard Work    [employer]chant [employer] officer Evolving/Moderate complexity    Clinical Decision Making Moderate    Rehab Potential  Good    PT Frequency 1x / week    PT Duration 12 weeks    PT Treatment/Interventions ADLs/Self Care Home Management;Biofeedback;Electrical Stimulation;Moist Heat;Cryotherapy;Functional mobility training;Therapeutic activities;Neuromuscular re-education;Balance training;Therapeutic exercise;Manual techniques;Patient/family education;Passive range of motion;Joint Manipulations;Spinal Manipulations;Dry needling    PT Next Visit Plan review bowel log, levator ani gentle STM/stretching, give PF stretching, intro diaphragmatic breathing, eventually: lumbar/hip/abdominal massage    PT Home Exercise Plan vulvar moisturizer (gave restore sample and rec coconut oil), avoid kegels for now, do bowel log x 3 days    Consulted and Agree with Plan of Care Patient           Patient will benefit from skilled therapeutic intervention in order to improve the following deficits and impairments:  Decreased coordination, Decreased range of motion, Increased fascial restricitons, Pain, Postural dysfunction, Impaired sensation, Decreased strength, Decreased mobility, Impaired flexibility, Improper body mechanics, Decreased balance, Increased muscle spasms, Decreased skin integrity  Visit Diagnosis: Muscle weakness (generalized) - Plan: PT plan of care cert/re-cert  Other lack of coordination - Plan: PT plan of care cert/re-cert  Cramp and spasm - Plan: PT plan of care cert/re-cert     Problem List Patient Active Problem List   Diagnosis Date Noted  . Encounter for removal of sutures 01/14/2020  . Abdominal aortic aneurysm (AAA) ([place]) 12/18/2019  . Chronic pain syndrome 12/18/2019  . Essential hypertension 12/18/2019  . History of TIA (transient ischemic attack) 12/18/2019  . Fecal incontinence 12/18/2019  . Peripheral neuropathy due to chemotherapy ([place]) 12/18/2019  . Bipolar disorder ([place]) 12/18/2019  . Goals of care, counseling/discussion 12/18/2019  . Left breast mass 12/18/2019  . Ovarian cancer  ([place]) 12/17/2019    Valin Massie E [place] 02/05/2020, 1:02 PM  [facility] [address], [place], [place], [address] Phone: [phone]   Fax:  [phone]  Name: ALELI NAVEDO MRN: [id number] Date of Birth: 21-Oct-1970

## 2020-02-05 NOTE — Patient Instructions (Signed)
Please do a 3 day bowel and intake log Use sample of Restore and then use Coconut Oil for vulvar moisturizer nightly x 7 days, then 3 nights a week if good moisture has been restored to these tissues. No PF contractions for now until we can treat to release tension, then we will train contractions again from more normal resting tension.  Thanks for coming to see me!

## 2020-02-07 ENCOUNTER — Encounter: Payer: Self-pay | Admitting: Oncology

## 2020-02-07 DIAGNOSIS — N6320 Unspecified lump in the left breast, unspecified quadrant: Secondary | ICD-10-CM

## 2020-02-07 NOTE — Progress Notes (Signed)
[facility] called and asked for an order for a right breast ultrasound.  Verbal order given per Dr. Candis Schatz.

## 2020-02-11 ENCOUNTER — Ambulatory Visit: Payer: Medicare Other | Admitting: Physical Therapy

## 2020-02-11 ENCOUNTER — Other Ambulatory Visit: Payer: Self-pay

## 2020-02-11 ENCOUNTER — Encounter: Payer: Self-pay | Admitting: Physical Therapy

## 2020-02-11 DIAGNOSIS — R252 Cramp and spasm: Secondary | ICD-10-CM

## 2020-02-11 DIAGNOSIS — M6281 Muscle weakness (generalized): Secondary | ICD-10-CM

## 2020-02-11 DIAGNOSIS — R278 Other lack of coordination: Secondary | ICD-10-CM

## 2020-02-11 NOTE — Therapy (Signed)
[facility] [address], [place], [place], [address] Phone: [phone]   Fax:  [phone]  Physical Therapy Treatment  Patient Details  Name: Heidi Johnson MRN: [id number] Date of Birth: 09/30/1970 Referring Provider (PT): R15.9 (ICD-10-CM) - Incontinence of feces, unspecified fecal incontinence type   Encounter Date: 02/11/2020   PT End of Session - 02/11/20 1145    Visit Number 2    Date for PT Re-Evaluation 04/29/20    Authorization Type Medicare    PT Start Time 1145    PT Stop Time 1230    PT Time Calculation (min) 45 min    Activity Tolerance Patient tolerated treatment well    Behavior During Therapy [facility] for tasks assessed/performed           Past Medical History:  Diagnosis Date  . Cancer ([place])    ovarian cancer  . CHF (congestive heart failure) ([place])   . Hypertension   . Panic attacks   . Stroke [facility])     Past Surgical History:  Procedure Laterality Date  . ABDOMINAL HYSTERECTOMY    . BACK SURGERY    . TUBAL LIGATION      There were no vitals filed for this visit.   Subjective Assessment - 02/11/20 1145    Subjective I had some accidents last night.  I brought my bowel log.    Pertinent History ovarian cancer but taking a break from chemo for now, has chemo port, LE neuropathy, lumbar fusion L4-S1, CHF, HTN, Stroke, AAA, 2017 total hysterectomy/omenectomy/rectosigmoid resection, anxiety, depression    Limitations Sitting;Standing;Walking;House hold activities    Currently in Pain? Yes    Pain Score 8     Pain Location Back    Pain Orientation Right;Left    Pain Descriptors / Indicators Spasm;Stabbing;Burning    Pain Type Chronic pain    Pain Onset More than a month ago    Pain Frequency Constant                             [facility] Adult PT Treatment/Exercise - 02/11/20 0001      Self-Care   Self-Care Other Self-Care Comments    Other Self-Care Comments  lower  anterior resection care/diet, fiber review, log review      Neuro Re-ed    Neuro Re-ed Details  SL diaphragmatic breathing, belly big/hard, exhale and bulge with gutteral noise, TCs at levator for PF contractions 3x5 sec      Exercises   Exercises Lumbar      Lumbar Exercises: Stretches   Double Knee to Chest Stretch 1 rep;20 seconds    Other Lumbar Stretch Exercise quadruped rocking, child's pose 3x10    Other Lumbar Stretch Exercise hip IR/ER windshield wipers x 10      Manual Therapy   Manual Therapy Soft tissue mobilization    Soft tissue mobilization bil levator and coccygeus release                  PT Education - 02/11/20 1149    Education Details lower anterior resection care/diet, fiber review, log review    Person(s) Educated Patient    Methods Handout    Comprehension Verbalized understanding            PT Short Term Goals - 02/11/20 1327      PT SHORT TERM GOAL #1   Title Pt will be educated about and ind with  vulvar skin care.    Status Achieved      PT SHORT TERM GOAL #2   Title Pt will fill out 3 day bowel log and review dietary intake ideas for stool consistency    Status Achieved      PT SHORT TERM GOAL #3   Title Pt will learn HEP for gentle mobility and PF stretching    Status On-going      PT SHORT TERM GOAL #4   Title Pt will demo improved excursion of PF contractions from resting to at least 3-4/5 around levator ani for improved bowel training control.    Status On-going             PT Long Term Goals - 02/05/20 1253      PT LONG TERM GOAL #1   Title Pt will be ind with HEP and self-care strategies for pelvic floor dysfunction.    Time 12    Period Weeks    Status New    Target Date 04/29/20      PT LONG TERM GOAL #2   Title Pt will be able to delay defecation and get to toilet for BM at least 50% of the time.    Time 12    Period Weeks    Status New    Target Date 04/29/20      PT LONG TERM GOAL #3   Title Pt will  achieve Type 3 or 4 stool on Bristol Stool Chart at least 2 times a week.    Time 12    Period Weeks    Status New    Target Date 04/29/20      PT LONG TERM GOAL #4   Title Pt will reduce FIC-Q score by at least 10 points to demo improved QOL regarding bowel function and control.    Baseline 76/106 (leakage items 66/83, constipation 10/23)    Time 12    Period Weeks    Status New    Target Date 04/29/20      PT LONG TERM GOAL #5   Title Pt will be able to demo improved excursion of PF muscles for full bulge, relax, contract for continence, defecation, and reduced pelvic pain.    Time 12    Period Weeks    Status New    Target Date 04/29/20                 Plan - 02/11/20 1234    Clinical Impression Statement Pt returns for first follow up visit since evaluation.  She filled out 3 days of bowel log and PT reviewed with Pt to identifiy insol/sol fibers and leakage pattern.  PT advised on some swaps of foods to replace insoluble fibers to see about creating more bulk in stool.  PT also gave PF mobility and stretches for HEP.  PT performed bil levator ani and coccygeus release today.  Pt was able to better perform PF contraction following release.  She initially struggled with diaphragmatic breathing but PT gave TC and VCs at abdomen and Pt was able to perform.  She was even able to make belly big/hard and perform more successful anal bulge for toileting by end of session.  She will need more review of this next viist.  She benefits from TCs for both PF contractions and breath work.    Comorbidities on disability, ovarian cancer (active), lumbar fusion, bil LE neuropathy, extensive abdominal surgeries for cancer with rectosigmoid resection and total hysterectomy 4 years  ago    Examination-Participation Curator;Occupation;Shop;Interpersonal Relationship;Cleaning;Yard Work    Rehab Potential Good    PT Frequency 1x / week    PT Duration 12 weeks    PT  Treatment/Interventions ADLs/Self Care Home Management;Biofeedback;Electrical Stimulation;Moist Heat;Cryotherapy;Functional mobility training;Therapeutic activities;Neuromuscular re-education;Balance training;Therapeutic exercise;Manual techniques;Patient/family education;Passive range of motion;Joint Manipulations;Spinal Manipulations;Dry needling    PT Next Visit Plan ongoing convo about diet/fiber, SL diaphragmatic breathing with bulge practice, levator and coccygeus release bil with TC for PF contractions after release    PT Home Exercise Plan Access Code: [email or web address], SL diaphragmatic breathing    Consulted and Agree with Plan of Care Patient           Patient will benefit from skilled therapeutic intervention in order to improve the following deficits and impairments:     Visit Diagnosis: Muscle weakness (generalized)  Other lack of coordination  Cramp and spasm     Problem List Patient Active Problem List   Diagnosis Date Noted  . Encounter for removal of sutures 01/14/2020  . Abdominal aortic aneurysm (AAA) ([place]) 12/18/2019  . Chronic pain syndrome 12/18/2019  . Essential hypertension 12/18/2019  . History of TIA (transient ischemic attack) 12/18/2019  . Fecal incontinence 12/18/2019  . Peripheral neuropathy due to chemotherapy ([place]) 12/18/2019  . Bipolar disorder ([place]) 12/18/2019  . Goals of care, counseling/discussion 12/18/2019  . Left breast mass 12/18/2019  . Ovarian cancer ([place]) 12/17/2019    Heidi Johnson, PT 02/11/20 1:28 PM    [facility] [address], [place], [place], [address] Phone: [phone]   Fax:  [phone]  Name: Heidi Johnson MRN: [id number] Date of Birth: [birth year]

## 2020-02-11 NOTE — Patient Instructions (Addendum)
Bowel Movements after Lower Anterior Resection Bowel patterns and habits after this operation will be different following removal of or partial removal of the rectum.  The less rectum remaining can mean greater changes in bowel habits.  After this operation you may need to go to the bathroom more often for small amounts of stool.   Stools may be clustered - a high number of bowel movements in a short period of time (hours). Constipation can follow clustering. Often bowel movements are urgent.  You may experience fecal incontinence or reduced control over bowel movements. Your rectum may hold less stool than it did before surgery.  Over time the rectum will accommodate more stool. Improvement in bowel patterns can continue for up to a year or more. Attention to dietary intake to find optimal fiber balance for more consistent bowel patterns will be ongoing following surgery.  You may be extremely sensitive to foods high in insoluble fibers.    If you are experiencing frequent bowel movements, certain foods may be helpful in decreasing bowel movements and reducing anal irritation.   These foods are soluble fiber foods which help bulk and soften the stool such as: Marland Kitchen White rice . Oatmeal . Mashed potatoes . Sweet potatoes . Bananas . Applesauce . Hard cheeses . Yogurt . Baked rather than fried foods . Pasta (but not with heavy sauce or tomato sauce)  Foods to avoid if you are having frequent bowel movements:  . whole grain breads and cereals  . nuts and seeds . raw or dried fruits . leafy vegetables like spinach and lettuce . fruits and vegetables - these may be introduced in small quantities over time.  Best if cooked/canned vs raw . caffeine may trigger more bowel movements  Having some oil in your diet is helpful, such as olive oil or canola oil.  This helps with the passage of stool.  Anal Irritation: this may occur due to high frequency of bowel movements.  To reduce  irritation: . use coconut oil or A&D ointment around anus for skin protection . cleanse anal area with water only . use a squirt bottle filled with water to cleanse . do not wipe with toilet paper, or use toilet paper moistened with water . avoid soap in anal area . pat dry or use a cool hair dryer . you can experiment with baby wipes or other personal wipes if you wish, especially when out and using water is not convenient  Access Code: [email or web address] URL: [email or web address]: 08/24/2021Prepared by: Venetia Night BeuhringExercises  Supine Hip Internal and External Rotation - 1 x daily - 7 x weekly - 2 sets - 10 reps  Supine Double Knee to Chest - 1 x daily - 7 x weekly - 1 sets - 3 reps - 10 hold  Quadruped Rocking Slow - 1 x daily - 7 x weekly - 2 sets - 10 reps  Child's Pose Stretch - 1 x daily - 7 x weekly - 1 sets - 3 reps - 10 hold

## 2020-02-13 ENCOUNTER — Other Ambulatory Visit: Payer: Self-pay | Admitting: Hematology and Oncology

## 2020-02-13 ENCOUNTER — Telehealth: Payer: Self-pay | Admitting: Oncology

## 2020-02-13 MED ORDER — CYCLOBENZAPRINE HCL 5 MG PO TABS: 5.0000 mg | ORAL_TABLET | Freq: Three times a day (TID) | ORAL | 1 refills | Status: DC | PRN

## 2020-02-13 NOTE — Telephone Encounter (Signed)
Called Heidi Johnson back and advised her of message below from Dr. Alvy Bimler.  She verbalized agreement and said that she usually takes it twice a day for neuropathy/cramps in her legs and to help her walk.  She only takes it 3 times a day when the pain is severe.  She said palliative care started her on it in [place].

## 2020-02-13 NOTE — Telephone Encounter (Signed)
I only ordered a small amount I do not recommend she takes it consistently If she needs it consistently she needs to call primary doctor for refill

## 2020-02-13 NOTE — Telephone Encounter (Signed)
Machell left a message asking if Dr. Alvy Bimler would be able to refill her Flexeril [facility] [place]

## 2020-02-17 ENCOUNTER — Telehealth: Payer: Self-pay | Admitting: Oncology

## 2020-02-17 ENCOUNTER — Other Ambulatory Visit: Payer: Self-pay | Admitting: Hematology and Oncology

## 2020-02-17 MED ORDER — DILAUDID 2 MG PO TABS: 2.0000 mg | ORAL_TABLET | Freq: Four times a day (QID) | ORAL | 0 refills | Status: DC | PRN

## 2020-02-17 NOTE — Telephone Encounter (Signed)
Notified [place] that the refill has been sent.

## 2020-02-17 NOTE — Telephone Encounter (Signed)
Heidi Johnson called and requested a [facility]ill [facility] [address] She said she has several tablets left and will need the refill tomorrow.

## 2020-02-17 NOTE — Telephone Encounter (Signed)
done

## 2020-02-18 ENCOUNTER — Other Ambulatory Visit: Payer: Self-pay

## 2020-02-18 ENCOUNTER — Telehealth: Payer: Self-pay | Admitting: Oncology

## 2020-02-18 ENCOUNTER — Other Ambulatory Visit: Payer: Self-pay | Admitting: Hematology and Oncology

## 2020-02-18 ENCOUNTER — Encounter: Payer: Self-pay | Admitting: Physical Therapy

## 2020-02-18 ENCOUNTER — Ambulatory Visit: Payer: Medicare Other | Admitting: Physical Therapy

## 2020-02-18 DIAGNOSIS — R278 Other lack of coordination: Secondary | ICD-10-CM

## 2020-02-18 DIAGNOSIS — M6281 Muscle weakness (generalized): Secondary | ICD-10-CM

## 2020-02-18 DIAGNOSIS — R252 Cramp and spasm: Secondary | ICD-10-CM

## 2020-02-18 MED ORDER — DILAUDID 2 MG PO TABS: 2.0000 mg | ORAL_TABLET | Freq: Four times a day (QID) | ORAL | 0 refills | Status: DC | PRN

## 2020-02-18 NOTE — Therapy (Signed)
[facility] [address] [place], [place], [address] Phone: [phone]   Fax:  [phone]  Physical Therapy Treatment  Patient Details  Name: Heidi Johnson MRN: [id number] Date of Birth: [birth year] Referring Provider (PT): R15.9 (ICD-10-CM) - Incontinence of feces, unspecified fecal incontinence type   Encounter Date: 02/18/2020   PT End of Session - 02/18/20 1148    Visit Number 3    Date for PT Re-Evaluation 04/29/20    Authorization Type Medicare    Progress Note Due on Visit 10    PT Start Time 1147    PT Stop Time 1235    PT Time Calculation (min) 48 min    Activity Tolerance Patient tolerated treatment well    Behavior During Therapy [facility] for tasks assessed/performed           Past Medical History:  Diagnosis Date  . Cancer ([place])    ovarian cancer  . CHF (congestive heart failure) ([place])   . Hypertension   . Panic attacks   . Stroke [facility])     Past Surgical History:  Procedure Laterality Date  . ABDOMINAL HYSTERECTOMY    . BACK SURGERY    . TUBAL LIGATION      There were no vitals filed for this visit.   Subjective Assessment - 02/18/20 1150    Subjective I have changed my foods like you've suggested and my stool is more formed and soft and having less leakage of loose stool.  I still don't get how to push to go poop.  I still have to splint with my fingers to get it out.    Pertinent History ovarian cancer but taking a break from chemo for now, has chemo port, LE neuropathy, lumbar fusion L4-S1, CHF, HTN, Stroke, AAA, 2017 total hysterectomy/omenectomy/rectosigmoid resection, anxiety, depression    Limitations Sitting;Standing;Walking;House hold activities    Currently in Pain? Yes    Pain Score 7     Pain Location Back    Pain Orientation Right;Left    Pain Descriptors / Indicators Spasm    Pain Type Chronic pain    Pain Radiating Towards bil LE neuropathy    Pain Onset More than a  month ago    Pain Frequency Constant                             [facility] Adult PT Treatment/Exercise - 02/18/20 0001      Self-Care   Self-Care Other Self-Care Comments    Other Self-Care Comments  abdominal massage and vibration massage for gas pain relief      Neuro Re-ed    Neuro Re-ed Details  SL diaphragmatic breathing with TCs at abdomen and lateral ribcage, breathwork for toileting and bulging for toileting in SL with cue to "push my finger out" of anorectal canal      Manual Therapy   Manual Therapy Soft tissue mobilization    Manual therapy comments abdominal massage supine hooklying                    PT Short Term Goals - 02/11/20 1327      PT SHORT TERM GOAL #1   Title Pt will be educated about and ind with vulvar skin care.    Status Achieved      PT SHORT TERM GOAL #2   Title Pt will fill out 3 day bowel log and review dietary intake ideas  for stool consistency    Status Achieved      PT SHORT TERM GOAL #3   Title Pt will learn HEP for gentle mobility and PF stretching    Status On-going      PT SHORT TERM GOAL #4   Title Pt will demo improved excursion of PF contractions from resting to at least 3-4/5 around levator ani for improved bowel training control.    Status On-going             PT Long Term Goals - 02/05/20 1253      PT LONG TERM GOAL #1   Title Pt will be ind with HEP and self-care strategies for pelvic floor dysfunction.    Time 12    Period Weeks    Status New    Target Date 04/29/20      PT LONG TERM GOAL #2   Title Pt will be able to delay defecation and get to toilet for BM at least 50% of the time.    Time 12    Period Weeks    Status New    Target Date 04/29/20      PT LONG TERM GOAL #3   Title Pt will achieve Type 3 or 4 stool on [place] Stool Chart at least 2 times a week.    Time 12    Period Weeks    Status New    Target Date 04/29/20      PT LONG TERM GOAL #4   Title Pt will reduce FIC-Q  score by at least 10 points to demo improved QOL regarding bowel function and control.    Baseline 76/106 (leakage items 66/83, constipation 10/23)    Time 12    Period Weeks    Status New    Target Date 04/29/20      PT LONG TERM GOAL #5   Title Pt will be able to demo improved excursion of PF muscles for full bulge, relax, contract for continence, defecation, and reduced pelvic pain.    Time 12    Period Weeks    Status New    Target Date 04/29/20                 Plan - 02/18/20 1237    Clinical Impression Statement Pt is working on adhering to changes in her diet and is finding that her stool is now more bulked and soft altough she is having trouble creating bulge sensation to defecate.  PT noted that she is no longer tightening with attempts to bulge but she does not generate signif pressure either.  She does use internal and external splinting to void.  She is having less leakage of stool now that it is more bulked.  She arrived with abdominal pain and distention which improved with abdominal massage by PT.  She was able to relieve some gas afterwards.  PT instructed in self-massage to abdomen at home and encouraged ongoing tracking of diet to note how this affects her stool consistency.  She has improved resting tone of pelvic floor so PT re-instated PF contractions 3x10/day with goal of 3 sec/3 sec work/rest ratio.  She will continue to benefit from skilled PT for improved PF function and control over bowels.    Comorbidities on disability, ovarian cancer (active), lumbar fusion, bil LE neuropathy, extensive abdominal surgeries for cancer with rectosigmoid resection and total hysterectomy 4 years ago    PT Next Visit Plan f/u on PF contractions 3x10/day with goal  of 3 sec/3 sec work/rest ratio, self abdominal massage, ongoing convo about diet/stool consistency, work on seated toileting and lumbar STM, glut strength    PT Home Exercise Plan Access Code: [email or web address]    Consulted and  Agree with Plan of Care Patient           Patient will benefit from skilled therapeutic intervention in order to improve the following deficits and impairments:     Visit Diagnosis: Muscle weakness (generalized)  Other lack of coordination  Cramp and spasm     Problem List Patient Active Problem List   Diagnosis Date Noted  . Encounter for removal of sutures 01/14/2020  . Abdominal aortic aneurysm (AAA) ([place]) 12/18/2019  . Chronic pain syndrome 12/18/2019  . Essential hypertension 12/18/2019  . History of TIA (transient ischemic attack) 12/18/2019  . Fecal incontinence 12/18/2019  . Peripheral neuropathy due to chemotherapy ([place]) 12/18/2019  . Bipolar disorder ([place]) 12/18/2019  . Goals of care, counseling/discussion 12/18/2019  . Left breast mass 12/18/2019  . Ovarian cancer ([place]) 12/17/2019    Baruch Merl, PT 02/18/20 12:45 PM   [facility] [address], [place], [place], [address] Phone: [phone]   Fax:  [phone]  Name: Heidi Johnson MRN: [id number] Date of Birth: 10/04/1970

## 2020-02-18 NOTE — Telephone Encounter (Signed)
I sent to the [place] [facility] Please call [place] to not dispense it until next refill

## 2020-02-18 NOTE — Telephone Encounter (Signed)
Kristyl called and said the [facility] in [place] is out of dilaudid.  They have ordered it and won't have it for 2 days.  The [facility] in [place] on [address] does have it so she is wondering if Dr. Alvy Bimler would be able to resend it there.  She is in [place] for her Physical Therapy appointment.

## 2020-02-18 NOTE — Telephone Encounter (Signed)
Called [facility] [place] and advised them that [facility] [address] [facility] [place].  They said they will cancel the prescription there.  Called Lucina and let her know the refill has been sent to [facility].

## 2020-03-04 ENCOUNTER — Ambulatory Visit: Payer: Medicare Other | Admitting: Physical Therapy

## 2020-03-09 ENCOUNTER — Telehealth: Payer: Self-pay | Admitting: Hematology and Oncology

## 2020-03-09 ENCOUNTER — Inpatient Hospital Stay: Payer: Medicare Other | Attending: Hematology and Oncology | Admitting: Hematology and Oncology

## 2020-03-09 ENCOUNTER — Inpatient Hospital Stay: Payer: Medicare Other

## 2020-03-09 ENCOUNTER — Encounter: Payer: Self-pay | Admitting: Hematology and Oncology

## 2020-03-09 ENCOUNTER — Other Ambulatory Visit: Payer: Self-pay

## 2020-03-09 VITALS — BP 165/99 | HR 91 | Temp 97.1°F | Resp 18 | Ht 69.0 in | Wt 193.0 lb

## 2020-03-09 DIAGNOSIS — I10 Essential (primary) hypertension: Secondary | ICD-10-CM | POA: Diagnosis not present

## 2020-03-09 DIAGNOSIS — Z7982 Long term (current) use of aspirin: Secondary | ICD-10-CM | POA: Diagnosis not present

## 2020-03-09 DIAGNOSIS — I2510 Atherosclerotic heart disease of native coronary artery without angina pectoris: Secondary | ICD-10-CM | POA: Insufficient documentation

## 2020-03-09 DIAGNOSIS — G894 Chronic pain syndrome: Secondary | ICD-10-CM | POA: Diagnosis not present

## 2020-03-09 DIAGNOSIS — C786 Secondary malignant neoplasm of retroperitoneum and peritoneum: Secondary | ICD-10-CM | POA: Insufficient documentation

## 2020-03-09 DIAGNOSIS — I714 Abdominal aortic aneurysm, without rupture, unspecified: Secondary | ICD-10-CM

## 2020-03-09 DIAGNOSIS — C569 Malignant neoplasm of unspecified ovary: Secondary | ICD-10-CM

## 2020-03-09 DIAGNOSIS — L089 Local infection of the skin and subcutaneous tissue, unspecified: Secondary | ICD-10-CM | POA: Insufficient documentation

## 2020-03-09 DIAGNOSIS — C782 Secondary malignant neoplasm of pleura: Secondary | ICD-10-CM | POA: Diagnosis not present

## 2020-03-09 DIAGNOSIS — C801 Malignant (primary) neoplasm, unspecified: Secondary | ICD-10-CM

## 2020-03-09 DIAGNOSIS — Z79899 Other long term (current) drug therapy: Secondary | ICD-10-CM | POA: Insufficient documentation

## 2020-03-09 LAB — CBC WITH DIFFERENTIAL/PLATELET
Abs Immature Granulocytes: 0.02 10*3/uL (ref 0.00–0.07)
Basophils Absolute: 0.1 10*3/uL (ref 0.0–0.1)
Basophils Relative: 2 %
Eosinophils Absolute: 0.2 10*3/uL (ref 0.0–0.5)
Eosinophils Relative: 2 %
HCT: 40.8 % (ref 36.0–46.0)
Hemoglobin: 13.5 g/dL (ref 12.0–15.0)
Immature Granulocytes: 0 %
Lymphocytes Relative: 27 %
Lymphs Abs: 1.9 10*3/uL (ref 0.7–4.0)
MCH: 30.2 pg (ref 26.0–34.0)
MCHC: 33.1 g/dL (ref 30.0–36.0)
MCV: 91.3 fL (ref 80.0–100.0)
Monocytes Absolute: 0.5 10*3/uL (ref 0.1–1.0)
Monocytes Relative: 8 %
Neutro Abs: 4.3 10*3/uL (ref 1.7–7.7)
Neutrophils Relative %: 61 %
Platelets: 160 10*3/uL (ref 150–400)
RBC: 4.47 MIL/uL (ref 3.87–5.11)
RDW: 15.7 % — ABNORMAL HIGH (ref 11.5–15.5)
WBC: 7.1 10*3/uL (ref 4.0–10.5)
nRBC: 0 % (ref 0.0–0.2)

## 2020-03-09 LAB — COMPREHENSIVE METABOLIC PANEL WITH GFR
ALT: 15 U/L (ref 0–44)
AST: 16 U/L (ref 15–41)
Albumin: 3.6 g/dL (ref 3.5–5.0)
Alkaline Phosphatase: 83 U/L (ref 38–126)
Anion gap: 12 (ref 5–15)
BUN: 9 mg/dL (ref 6–20)
CO2: 19 mmol/L — ABNORMAL LOW (ref 22–32)
Calcium: 9 mg/dL (ref 8.9–10.3)
Chloride: 110 mmol/L (ref 98–111)
Creatinine, Ser: 0.68 mg/dL (ref 0.44–1.00)
GFR calc Af Amer: >60 mL/min
GFR calc non Af Amer: >60 mL/min
Glucose, Bld: 87 mg/dL (ref 70–99)
Potassium: 3.7 mmol/L (ref 3.5–5.1)
Sodium: 141 mmol/L (ref 135–145)
Total Bilirubin: 0.3 mg/dL (ref 0.3–1.2)
Total Protein: 7 g/dL (ref 6.5–8.1)

## 2020-03-09 MED ORDER — CEPHALEXIN 500 MG PO CAPS: 500.0000 mg | ORAL_CAPSULE | Freq: Three times a day (TID) | ORAL | 0 refills | Status: DC

## 2020-03-09 MED ORDER — DILAUDID 2 MG PO TABS
2.0000 mg | ORAL_TABLET | Freq: Four times a day (QID) | ORAL | 0 refills | Status: DC | PRN
Start: 2020-03-09 — End: 2020-03-30

## 2020-03-09 NOTE — Patient Instructions (Signed)

## 2020-03-09 NOTE — Assessment & Plan Note (Signed)
We discussed chronic pain management I refill her prescription today and we discussed narcotic refill policy

## 2020-03-09 NOTE — Progress Notes (Signed)
[facility] OFFICE PROGRESS NOTE  Patient Care Team: Pcp, No as PCP - General  ASSESSMENT & PLAN:  Ovarian cancer ([place]) She has no signs or symptoms to suggest cancer progression As previously discussed, I plan to repeat imaging study next month for further follow-up She is in agreement with the plan of care  Chronic pain syndrome We discussed chronic pain management I refill her prescription today and we discussed narcotic refill policy  Skin infection She has minor skin infection I recommend a course of antibiotic therapy   Orders Placed This Encounter  Procedures  . CT ABDOMEN PELVIS W CONTRAST    Standing Status:   Future    Standing Expiration Date:   03/09/2021    Order Specific Question:   If indicated for the ordered procedure, I authorize the administration of contrast media per Radiology protocol    Answer:   Yes    Order Specific Question:   Preferred imaging location?    Answer:   [facility]    Order Specific Question:   Radiology Contrast Protocol - do NOT remove file path    Answer:   [email or web address]    Order Specific Question:   Is patient pregnant?    Answer:   No    All questions were answered. The patient knows to call the clinic with any problems, questions or concerns. The total time spent in the appointment was 20 minutes encounter with patients including review of chart and various tests results, discussions about plan of care and coordination of care plan   [name], MD 03/09/2020 7:25 PM  INTERVAL HISTORY: Please see below for problem oriented charting. She returns for further follow-up Denies abdominal pain, nausea or changes in bowel habits Her chronic pain and neuropathy is stable She have a small lesion near the right buttock region that has not been healing well for over 2 weeks No recent fever or chills  SUMMARY OF ONCOLOGIC HISTORY: Oncology History Overview Note   Negative genetics at [facility]   Ovarian cancer ([place])  04/25/2016 Imaging   CT confirms 12cm ovarian masses with extensive peritoneal metastases and malignant pleural effusions   05/05/2016 Surgery   s/p ex-lap posterior exenteration, diaphragm stripping, cholecystectomy, omentectomy, appendectomy and optimal tumor debulking on 05/05/16   06/24/2016 - 10/24/2016 Chemotherapy   Started Taxol/Carboplatin. 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  Numerous left predominant calcified pleural metastases and associated small left pleural effusion are unchanged.  2.  Unchanged (3.0 cm) (series 3, image 96) left breast mass    02/07/2019 Imaging   Outside Ct abdomen and pelvis 1. Overall stable small volume peritoneal tumor, as described above.  2. Unchanged infrarenal abdominal aortic aneurysm.    02/22/2019 Tumor Marker   Patient's tumor was tested for the following markers: CA-125 Results of the tumor marker test revealed 164   03/18/2019 Tumor Marker   Patient's tumor was tested for the following markers: CA-125 Results of the tumor marker test revealed 162   04/08/2019 Tumor Marker   Patient's tumor was tested  for the following markers: CA-125 Results of the tumor marker test revealed 164   04/29/2019 Tumor Marker   Patient's tumor was tested for the following markers: CA-125 Results of the tumor marker test revealed 181   05/20/2019 Tumor Marker   Patient's tumor was tested for the following markers: CA-125 Results of the tumor marker test revealed 164   06/10/2019 Tumor  Marker   Patient's tumor was tested for the following markers: CA-125 Results of the tumor marker test revealed 152   07/01/2019 Tumor Marker   Patient's tumor was tested for the following markers: CA-125 Results of the tumor marker test revealed 160   07/22/2019 Tumor Marker   Patient's tumor was tested for the following markers: CA-125 Results of the tumor marker test revealed 171   08/12/2019 Tumor Marker   Patient's tumor was tested for the following markers: CA-125 Results of the tumor marker test revealed 173   09/02/2019 Tumor Marker   Patient's tumor was tested for the following markers: CA-125 Results of the tumor marker test revealed 159   09/30/2019 Tumor Marker   Patient's tumor was tested for the following markers: CA-125 Results of the tumor marker test revealed 168   11/08/2019 Imaging   Outside CT imaging 1. Study was performed at outside institution on 11/08/2019 and presented for review on 11/14/2019.  2. Stable calcified and noncalcified left-sided nodular and plaque-like pleural abnormalities with small chronic left pleural effusion compatible with stable treated pleural metastasis. No new or enlarging lesions.  3. No enlarging thoracic lymph nodes.  4. Redemonstration of left breast mass. Correlation with mammography is suggested if not already performed   12/02/2019 Tumor Marker   Patient's tumor was tested for the following markers: CA-125 Results of the tumor marker test revealed 186   12/18/2019 Cancer Staging   Staging form: Ovary, Fallopian Tube, and Primary Peritoneal Carcinoma, AJCC 8th Edition - Clinical stage from 12/18/2019: FIGO Stage IIIC (ycT3c, cN0, cM0) - Signed by [name], MD on 12/18/2019   01/09/2020 Tumor Marker   Patient's tumor was tested for the following markers: CA-125 Results of the tumor marker test revealed 184   01/10/2020 Imaging   1. Treated left pleural and peritoneal metastatic disease. Areas of residual soft tissue prominence in  the right anatomic pelvis are indeterminate. Comparison with prior exams would be helpful. 2. Hepatomegaly. Low-attenuation lesions in the liver are too small to characterize. Comparison with prior exams would be helpful. 3. Small left fibrothorax. 4. Infrarenal Aortic aneurysm NOS (ICD10-I71.9). Recommend followup by ultrasound in 2 years.  5. Aortic atherosclerosis (ICD10-I70.0). Coronary artery calcification.     REVIEW OF SYSTEMS:   Constitutional: Denies fevers, chills or abnormal weight loss Eyes: Denies blurriness of vision Ears, nose, mouth, throat, and face: Denies mucositis or sore throat Respiratory: Denies cough, dyspnea or wheezes Cardiovascular: Denies palpitation, chest discomfort or lower extremity swelling Gastrointestinal:  Denies nausea, heartburn or change in bowel habits Lymphatics: Denies new lymphadenopathy or easy bruising Behavioral/Psych: Mood is stable, no new changes  All other systems were reviewed with the patient and are negative.  I have reviewed the past medical history, past surgical history, social history and family history with the patient and they are unchanged from previous note.  ALLERGIES:  is allergic to latex and septra [sulfamethoxazole-trimethoprim].  MEDICATIONS:  Current Outpatient Medications  Medication Sig Dispense Refill  . acetaminophen (TYLENOL) 325 MG tablet Take 325 mg by mouth 4 (four) times daily.    Marland Kitchen albuterol (VENTOLIN  HFA) 108 (90 Base) MCG/ACT inhaler Inhale 2 puffs into the lungs as needed. 2 puffs into lungs every 6 hours as needed for wheezing or SOB    . aspirin 325 MG EC tablet Take 325 mg by mouth daily.    . cephALEXin (KEFLEX) 500 MG capsule Take 1 capsule (500 mg total) by mouth 3 (three) times daily. 21 capsule 0  . clonazePAM (KLONOPIN) 1 MG tablet Take 1 mg by mouth 3 (three) times daily as needed for anxiety.    . cyclobenzaprine (FLEXERIL) 5 MG tablet Take 1 tablet (5 mg total) by mouth 3 (three) times daily as  needed. 60 tablet 1  . DILAUDID 2 MG tablet Take 1 tablet (2 mg total) by mouth 4 (four) times daily as needed. 90 tablet 0  . docusate sodium (COLACE) 100 MG capsule Take 100 mg by mouth 2 (two) times daily.    Marland Kitchen gabapentin (NEURONTIN) 300 MG capsule TAKE 2 CAPSULES IN MORNING, 2 CAPSULES (600 MG) MIDDAY, AND 3 CAPSULES (900 MG) AT BEDTIME. 180 capsule 4  . hydrochlorothiazide (HYDRODIURIL) 25 MG tablet Take 25 mg by mouth daily.    Marland Kitchen ibuprofen (ADVIL,MOTRIN) 800 MG tablet Take 800 mg by mouth every 8 (eight) hours as needed for mild pain or moderate pain.    Marland Kitchen lidocaine-prilocaine (EMLA) cream Apply 1 application topically as needed.    Marland Kitchen lisinopril-hydrochlorothiazide (ZESTORETIC) 20-25 MG tablet Take 1 tablet by mouth 2 (two) times daily.    Marland Kitchen loperamide (IMODIUM A-D) 2 MG tablet Take 2 mg by mouth 4 (four) times daily as needed for diarrhea or loose stools.    Marland Kitchen MARINOL 5 MG capsule Take 5 mg by mouth 3 (three) times daily.    . naloxone (NARCAN) 2 MG/2ML injection Place 2 mg into the nose as needed.    . naproxen (NAPROSYN) 500 MG tablet Take 500 mg by mouth 2 (two) times daily.    Marland Kitchen OLANZapine (ZYPREXA) 10 MG tablet Take 10 mg by mouth at bedtime.    . ondansetron (ZOFRAN) 8 MG tablet Take 8 mg by mouth every 8 (eight) hours as needed.    . rosuvastatin (CRESTOR) 40 MG tablet Take 40 mg by mouth daily.    . simethicone (MYLICON) 80 MG chewable tablet Chew 80 mg by mouth every 6 (six) hours as needed.     No current facility-administered medications for this visit.    PHYSICAL EXAMINATION: ECOG PERFORMANCE STATUS: 1 - Symptomatic but completely ambulatory  Vitals:   03/09/20 1033  BP: (!) 165/99  Pulse: 91  Resp: 18  Temp: (!) 97.1 F (36.2 C)  SpO2: 99%   Filed Weights   03/09/20 1033  Weight: 193 lb (87.5 kg)    GENERAL:alert, no distress and comfortable SKIN: Noted small skin lesion near her buttock region consistent with possible local skin infection EYES: normal,  Conjunctiva are pink and non-injected, sclera clear OROPHARYNX:no exudate, no erythema and lips, buccal mucosa, and tongue normal  NECK: supple, thyroid normal size, non-tender, without nodularity LYMPH:  no palpable lymphadenopathy in the cervical, axillary or inguinal LUNGS: clear to auscultation and percussion with normal breathing effort HEART: regular rate & rhythm and no murmurs and no lower extremity edema ABDOMEN:abdomen soft, non-tender and normal bowel sounds Musculoskeletal:no cyanosis of digits and no clubbing  NEURO: alert & oriented x 3 with fluent speech, no focal motor/sensory deficits  LABORATORY DATA:  I have reviewed the data as listed    Component Value Date/Time  NA 141 03/09/2020 1005   K 3.7 03/09/2020 1005   CL 110 03/09/2020 1005   CO2 19 (L) 03/09/2020 1005   GLUCOSE 87 03/09/2020 1005   BUN 9 03/09/2020 1005   CREATININE 0.68 03/09/2020 1005   CREATININE 0.71 01/09/2020 1404   CALCIUM 9.0 03/09/2020 1005   PROT 7.0 03/09/2020 1005   ALBUMIN 3.6 03/09/2020 1005   AST 16 03/09/2020 1005   AST 27 01/09/2020 1404   ALT 15 03/09/2020 1005   ALT 27 01/09/2020 1404   ALKPHOS 83 03/09/2020 1005   BILITOT 0.3 03/09/2020 1005   BILITOT 0.5 01/09/2020 1404   GFRNONAA >60 03/09/2020 1005   GFRNONAA >60 01/09/2020 1404   GFRAA >60 03/09/2020 1005   GFRAA >60 01/09/2020 1404    No results found for: SPEP, UPEP  Lab Results  Component Value Date   WBC 7.1 03/09/2020   NEUTROABS 4.3 03/09/2020   HGB 13.5 03/09/2020   HCT 40.8 03/09/2020   MCV 91.3 03/09/2020   PLT 160 03/09/2020      Chemistry      Component Value Date/Time   NA 141 03/09/2020 1005   K 3.7 03/09/2020 1005   CL 110 03/09/2020 1005   CO2 19 (L) 03/09/2020 1005   BUN 9 03/09/2020 1005   CREATININE 0.68 03/09/2020 1005   CREATININE 0.71 01/09/2020 1404      Component Value Date/Time   CALCIUM 9.0 03/09/2020 1005   ALKPHOS 83 03/09/2020 1005   AST 16 03/09/2020 1005   AST 27  01/09/2020 1404   ALT 15 03/09/2020 1005   ALT 27 01/09/2020 1404   BILITOT 0.3 03/09/2020 1005   BILITOT 0.5 01/09/2020 1404

## 2020-03-09 NOTE — Assessment & Plan Note (Signed)
She has no signs or symptoms to suggest cancer progression As previously discussed, I plan to repeat imaging study next month for further follow-up She is in agreement with the plan of care

## 2020-03-09 NOTE — Telephone Encounter (Signed)
Scheduled appts per 9/20 sch msg. Gave pt a print out of AVS.

## 2020-03-09 NOTE — Assessment & Plan Note (Signed)
She has minor skin infection I recommend a course of antibiotic therapy

## 2020-03-10 ENCOUNTER — Telehealth: Payer: Self-pay | Admitting: Oncology

## 2020-03-10 LAB — CA 125: Cancer Antigen (CA) 125: 214 U/mL — ABNORMAL HIGH (ref 0.0–38.1)

## 2020-03-10 NOTE — Telephone Encounter (Signed)
Called Heidi Johnson back and advised her of CA 125 results and message below from Dr. Alvy Bimler.  She verbalized understanding and agreement.

## 2020-03-10 NOTE — Telephone Encounter (Signed)
Heidi Johnson left a message asking about her CA 125 results.  Is it ok to let her know?

## 2020-03-10 NOTE — Telephone Encounter (Signed)
Yes let her know Even though number is a bit up, her exam is stable Plan for CT scan as discussed

## 2020-03-11 ENCOUNTER — Other Ambulatory Visit: Payer: Self-pay | Admitting: Hematology and Oncology

## 2020-03-24 ENCOUNTER — Ambulatory Visit: Payer: Medicare Other | Admitting: Physical Therapy

## 2020-03-25 ENCOUNTER — Telehealth: Payer: Self-pay | Admitting: Oncology

## 2020-03-25 NOTE — Telephone Encounter (Signed)
I recommend we move her CT to next Friday 10/15 if possible and then see me on 10/18, 1 hour appt She needs labs and flush before CT on 10/15

## 2020-03-25 NOTE — Telephone Encounter (Signed)
Heidi Johnson called and said she is having a small amount of vaginal bleeding that started this morning.  She noticed a small amount of bright red blood when wiping after urinating and is now having pink tinged spotting.  She hasn't had vaginal bleeding in 4 years.  She is wondering if she should try to see her PCP today because they have walk in appointments at 1 pm.

## 2020-03-25 NOTE — Telephone Encounter (Signed)
Daziah called and said her PCP said she does have a UTI and started her on Macrobid.  He also did a pelvic exam and didn't see anything concerning. She also said her grandson's surgery is on 04/02/20 and would like the CT moved up to 04/03/20 if possible.  Called her back with rescheduled lab/flush on 10/13 and CT appointment on 04/03/20 and also appointment with Dr. Alvy Bimler on 04/06/20.  She verbalized understanding and agreement.

## 2020-03-25 NOTE — Telephone Encounter (Signed)
Called Heidi Johnson back and she is waiting to see her PCP to make sure she doesn't have a UTI.  She would like to wait if possible and keep her CT on 10/21 because her grandson is having surgery on 10/18.  She will call back after her appointment today to let us know for sure about moving up the CT.

## 2020-03-30 ENCOUNTER — Other Ambulatory Visit: Payer: Self-pay | Admitting: Hematology and Oncology

## 2020-03-30 ENCOUNTER — Ambulatory Visit: Payer: Medicare Other | Attending: Hematology and Oncology | Admitting: Physical Therapy

## 2020-03-30 ENCOUNTER — Telehealth: Payer: Self-pay | Admitting: Oncology

## 2020-03-30 ENCOUNTER — Other Ambulatory Visit: Payer: Self-pay

## 2020-03-30 ENCOUNTER — Encounter: Payer: Self-pay | Admitting: Physical Therapy

## 2020-03-30 DIAGNOSIS — R252 Cramp and spasm: Secondary | ICD-10-CM | POA: Insufficient documentation

## 2020-03-30 DIAGNOSIS — M6281 Muscle weakness (generalized): Secondary | ICD-10-CM | POA: Diagnosis present

## 2020-03-30 DIAGNOSIS — R278 Other lack of coordination: Secondary | ICD-10-CM | POA: Diagnosis present

## 2020-03-30 MED ORDER — DILAUDID 2 MG PO TABS
2.0000 mg | ORAL_TABLET | Freq: Four times a day (QID) | ORAL | 0 refills | Status: DC | PRN
Start: 2020-03-30 — End: 2020-04-23

## 2020-03-30 NOTE — Telephone Encounter (Signed)
Heidi Johnson left a message requesting a refill of Dilaudid.  She will need it tomorrow.  She would like it sent to the [facility] on [address] in [place].

## 2020-03-30 NOTE — Telephone Encounter (Signed)
Called Heidi Johnson and let her know the refill has been sent.  She also mentioned that there is still a knot where the spider bite was on her buttock.  She will have Dr. Alvy Bimler examine it on her next appointment.

## 2020-03-30 NOTE — Therapy (Addendum)
[facility] [address] [place], [place], [address] Phone: [phone]   Fax:  [phone]  Physical Therapy Treatment  Patient Details  Name: Heidi Johnson MRN: [id number] Date of Birth: 09/05/1970 Referring Provider (PT): R15.9 (ICD-10-CM) - Incontinence of feces, unspecified fecal incontinence type   Encounter Date: 03/30/2020   PT End of Session - 03/30/20 1138    Visit Number 4    Date for PT Re-Evaluation 04/29/20    Authorization Type Medicare    Progress Note Due on Visit 10    PT Start Time 1140    PT Stop Time 1230    PT Time Calculation (min) 50 min    Activity Tolerance Patient tolerated treatment well    Behavior During Therapy [facility] for tasks assessed/performed           Past Medical History:  Diagnosis Date  . Cancer ([place])    ovarian cancer  . CHF (congestive heart failure) ([place])   . Hypertension   . Panic attacks   . Stroke [facility])     Past Surgical History:  Procedure Laterality Date  . ABDOMINAL HYSTERECTOMY    . BACK SURGERY    . TUBAL LIGATION      There were no vitals filed for this visit.   Subjective Assessment - 03/30/20 1139    Subjective Less accidents but still having trouble getting it out (stool).    Pertinent History ovarian cancer but taking a break from chemo for now, has chemo port, LE neuropathy, lumbar fusion L4-S1, CHF, HTN, Stroke, AAA, 2017 total hysterectomy/omenectomy/rectosigmoid resection, anxiety, depression    Currently in Pain? Yes    Pain Score 10-Worst pain ever    Pain Location Back    Pain Orientation Right;Left    Pain Descriptors / Indicators Spasm    Pain Type Chronic pain    Pain Radiating Towards bil LE neuropathy    Pain Onset More than a month ago    Effect of Pain on Daily Activities social outings                             [facility] Adult PT Treatment/Exercise - 03/30/20 0001      Neuro Re-ed    Neuro Re-ed Details   contract/relax PF 3x5 sec holds with TC Rt/Lt levator ani, PF bulging in SL, quadruped and sitting, PT using TCs and VCs with and without internal anorectal palpation in SL only, visual observation in quadruped      Lumbar Exercises: Stretches   Single Knee to Chest Stretch 3 reps;10 seconds    Double Knee to Chest Stretch 3 reps;10 seconds    Other Lumbar Stretch Exercise quadruped rocking with VCs for SITS bones wide for PF stretch      Lumbar Exercises: Aerobic   Recumbent Bike L1 x 1', d/c'd due to bil LE pain      Lumbar Exercises: Supine   Bridge Limitations frog bridge x 10 reps small range secondary to pain      Manual Therapy   Manual Therapy Soft tissue mobilization    Soft tissue mobilization bil levator ani release with contract/relax, stretching, massage                    PT Short Term Goals - 03/30/20 1155      PT SHORT TERM GOAL #1   Title Pt will be educated about and ind with  vulvar skin care.    Status Achieved      PT SHORT TERM GOAL #2   Title Pt will fill out 3 day bowel log and review dietary intake ideas for stool consistency    Status Achieved      PT SHORT TERM GOAL #3   Title Pt will learn HEP for gentle mobility and PF stretching    Status Achieved      PT SHORT TERM GOAL #4   Title Pt will demo improved excursion of PF contractions from resting to at least 3-4/5 around levator ani for improved bowel training control.    Status Achieved             PT Long Term Goals - 03/30/20 1156      PT LONG TERM GOAL #1   Title Pt will be ind with HEP and self-care strategies for pelvic floor dysfunction.    Status On-going      PT LONG TERM GOAL #2   Title Pt will be able to delay defecation and get to toilet for BM at least 50% of the time.    Status Achieved      PT LONG TERM GOAL #3   Title Pt will achieve Type 3 or 4 stool on [place] Stool Chart at least 2 times a week.    Baseline softer but needs improved defecation bulging  strength for success    Status On-going      PT LONG TERM GOAL #4   Title Pt will reduce FIC-Q score by at least 10 points to demo improved QOL regarding bowel function and control.    Status On-going      PT LONG TERM GOAL #5   Title Pt will be able to demo improved excursion of PF muscles for full bulge, relax, contract for continence, defecation, and reduced pelvic pain.    Baseline improving excursion    Status On-going                 Plan - 03/30/20 1158    Clinical Impression Statement Pt returns to clinic after 3 week break from PT.  She had some bleeding and was diagnosed with a UTI which is now resolved.  She has an upcoming CT Scan given elevation in some of her cancer bloodwork.  She has met all STGs and is making progress towards LTGs.  She met LTG of 50%+ less fecal leakage and more soft formed stool.  Chief complaint is inability to push to defecate.  Pt continues to internal evacuate manually.  Pt was able to demo improved PF drop/release with internal cueing for contract/relax after levator ani release bil.  She was also able to demo proper bulge in quadruped but has difficulty with this in SL and sitting.  She is working on breath control for toileting techniques.  PT used TC/VCs at abdomen and in anorectal canal for feedback.  PT encouraged Pt to take time with toileting to see if she can palpate a bulge before going to manual evacuation of bowels.  Pt will continue to benefit from skilled PT with ongoing monitoring of response to treatment.    Comorbidities on disability, ovarian cancer (active), lumbar fusion, bil LE neuropathy, extensive abdominal surgeries for cancer with rectosigmoid resection and total hysterectomy 4 years ago    PT Frequency 1x / week    PT Duration 12 weeks    PT Treatment/Interventions ADLs/Self Care Home Management;Biofeedback;Electrical Stimulation;Moist Heat;Cryotherapy;Functional mobility training;Therapeutic activities;Neuromuscular  re-education;Balance training;Therapeutic  exercise;Manual techniques;Patient/family education;Passive range of motion;Joint Manipulations;Spinal Manipulations;Dry needling    PT Next Visit Plan continue bil levator ani release and toileting breathing and bulging, f/u on CT Scan    PT Home Exercise Plan Access Code: [email or web address]    Consulted and Agree with Plan of Care Patient           Patient will benefit from skilled therapeutic intervention in order to improve the following deficits and impairments:     Visit Diagnosis: Muscle weakness (generalized)  Other lack of coordination  Cramp and spasm     Problem List Patient Active Problem List   Diagnosis Date Noted  . Peritoneal carcinomatosis ([place]) 03/09/2020  . Skin infection 03/09/2020  . Encounter for removal of sutures 01/14/2020  . Abdominal aortic aneurysm (AAA) ([place]) 12/18/2019  . Chronic pain syndrome 12/18/2019  . Essential hypertension 12/18/2019  . History of TIA (transient ischemic attack) 12/18/2019  . Fecal incontinence 12/18/2019  . Peripheral neuropathy due to chemotherapy ([place]) 12/18/2019  . Bipolar disorder ([place]) 12/18/2019  . Goals of care, counseling/discussion 12/18/2019  . Left breast mass 12/18/2019  . Ovarian cancer ([place]) 12/17/2019    Devontre Siedschlag, PT 03/30/20 12:44 PM  PHYSICAL THERAPY DISCHARGE SUMMARY  Visits from Start of Care: 4  Current functional level related to goals / functional outcomes: See above.  Pt called to cancel remaining appointments per her MD's request.  She needs further work up/treatment for cancer.   Remaining deficits: See above   Education / Equipment: HEP  Plan: Patient agrees to discharge.  Patient goals were partially met. Patient is being discharged due to the physician's request.  ?????         Baruch Merl, PT 04/16/20 11:21 AM     [facility] [address] [place],  [place], [address] Phone: [phone]   Fax:  [phone]  Name: Heidi Johnson MRN: [id number] Date of Birth: 29-Jun-1970

## 2020-03-30 NOTE — Telephone Encounter (Signed)
done

## 2020-04-01 ENCOUNTER — Inpatient Hospital Stay: Payer: Medicare Other | Attending: Hematology and Oncology

## 2020-04-01 ENCOUNTER — Other Ambulatory Visit: Payer: Self-pay

## 2020-04-01 ENCOUNTER — Inpatient Hospital Stay: Payer: Medicare Other

## 2020-04-01 DIAGNOSIS — C569 Malignant neoplasm of unspecified ovary: Secondary | ICD-10-CM | POA: Diagnosis present

## 2020-04-01 DIAGNOSIS — Z79899 Other long term (current) drug therapy: Secondary | ICD-10-CM | POA: Diagnosis not present

## 2020-04-01 DIAGNOSIS — I714 Abdominal aortic aneurysm, without rupture, unspecified: Secondary | ICD-10-CM

## 2020-04-01 DIAGNOSIS — F411 Generalized anxiety disorder: Secondary | ICD-10-CM | POA: Diagnosis not present

## 2020-04-01 DIAGNOSIS — R112 Nausea with vomiting, unspecified: Secondary | ICD-10-CM | POA: Insufficient documentation

## 2020-04-01 DIAGNOSIS — C782 Secondary malignant neoplasm of pleura: Secondary | ICD-10-CM | POA: Insufficient documentation

## 2020-04-01 DIAGNOSIS — Z8673 Personal history of transient ischemic attack (TIA), and cerebral infarction without residual deficits: Secondary | ICD-10-CM | POA: Diagnosis not present

## 2020-04-01 DIAGNOSIS — M549 Dorsalgia, unspecified: Secondary | ICD-10-CM | POA: Diagnosis not present

## 2020-04-01 DIAGNOSIS — I700 Atherosclerosis of aorta: Secondary | ICD-10-CM | POA: Diagnosis not present

## 2020-04-01 DIAGNOSIS — C786 Secondary malignant neoplasm of retroperitoneum and peritoneum: Secondary | ICD-10-CM | POA: Diagnosis not present

## 2020-04-01 DIAGNOSIS — Z881 Allergy status to other antibiotic agents status: Secondary | ICD-10-CM | POA: Insufficient documentation

## 2020-04-01 DIAGNOSIS — M25552 Pain in left hip: Secondary | ICD-10-CM | POA: Diagnosis not present

## 2020-04-01 DIAGNOSIS — Z7952 Long term (current) use of systemic steroids: Secondary | ICD-10-CM | POA: Insufficient documentation

## 2020-04-01 DIAGNOSIS — Z9049 Acquired absence of other specified parts of digestive tract: Secondary | ICD-10-CM | POA: Diagnosis not present

## 2020-04-01 DIAGNOSIS — R1012 Left upper quadrant pain: Secondary | ICD-10-CM | POA: Insufficient documentation

## 2020-04-01 DIAGNOSIS — Z95828 Presence of other vascular implants and grafts: Secondary | ICD-10-CM

## 2020-04-01 DIAGNOSIS — I2510 Atherosclerotic heart disease of native coronary artery without angina pectoris: Secondary | ICD-10-CM | POA: Diagnosis not present

## 2020-04-01 DIAGNOSIS — I10 Essential (primary) hypertension: Secondary | ICD-10-CM | POA: Diagnosis not present

## 2020-04-01 LAB — COMPREHENSIVE METABOLIC PANEL
ALT: 12 U/L (ref 0–44)
AST: 17 U/L (ref 15–41)
Albumin: 3.9 g/dL (ref 3.5–5.0)
Alkaline Phosphatase: 100 U/L (ref 38–126)
Anion gap: 9 (ref 5–15)
BUN: 23 mg/dL — ABNORMAL HIGH (ref 6–20)
CO2: 25 mmol/L (ref 22–32)
Calcium: 9.7 mg/dL (ref 8.9–10.3)
Chloride: 103 mmol/L (ref 98–111)
Creatinine, Ser: 0.83 mg/dL (ref 0.44–1.00)
GFR, Estimated: >60 mL/min (ref >60)
Glucose, Bld: 114 mg/dL — ABNORMAL HIGH (ref 70–99)
Potassium: 4 mmol/L (ref 3.5–5.1)
Sodium: 137 mmol/L (ref 135–145)
Total Bilirubin: 0.3 mg/dL (ref 0.3–1.2)
Total Protein: 7.3 g/dL (ref 6.5–8.1)

## 2020-04-01 LAB — CBC WITH DIFFERENTIAL/PLATELET
Abs Immature Granulocytes: 0.03 10*3/uL (ref 0.00–0.07)
Basophils Absolute: 0.1 10*3/uL (ref 0.0–0.1)
Basophils Relative: 1 %
Eosinophils Absolute: 0.1 10*3/uL (ref 0.0–0.5)
Eosinophils Relative: 1 %
HCT: 43.2 % (ref 36.0–46.0)
Hemoglobin: 14.1 g/dL (ref 12.0–15.0)
Immature Granulocytes: 0 %
Lymphocytes Relative: 20 %
Lymphs Abs: 1.9 10*3/uL (ref 0.7–4.0)
MCH: 29 pg (ref 26.0–34.0)
MCHC: 32.6 g/dL (ref 30.0–36.0)
MCV: 88.9 fL (ref 80.0–100.0)
Monocytes Absolute: 0.7 10*3/uL (ref 0.1–1.0)
Monocytes Relative: 8 %
Neutro Abs: 6.3 10*3/uL (ref 1.7–7.7)
Neutrophils Relative %: 70 %
Platelets: 201 10*3/uL (ref 150–400)
RBC: 4.86 MIL/uL (ref 3.87–5.11)
RDW: 15.1 % (ref 11.5–15.5)
WBC: 9.1 10*3/uL (ref 4.0–10.5)
nRBC: 0 % (ref 0.0–0.2)

## 2020-04-01 MED ORDER — SODIUM CHLORIDE 0.9% FLUSH
10.0000 mL | INTRAVENOUS | Status: DC | PRN
  Administered 2020-04-01: 10 mL
  Filled 2020-04-01: qty 10

## 2020-04-01 MED ORDER — HEPARIN SOD (PORK) LOCK FLUSH 100 UNIT/ML IV SOLN
500.0000 [IU] | Freq: Once | INTRAVENOUS | Status: AC
  Administered 2020-04-01: 500 [IU]
  Filled 2020-04-01: qty 5

## 2020-04-01 NOTE — Patient Instructions (Signed)

## 2020-04-02 LAB — CA 125: Cancer Antigen (CA) 125: 245 U/mL — ABNORMAL HIGH (ref 0.0–38.1)

## 2020-04-03 ENCOUNTER — Encounter (HOSPITAL_COMMUNITY): Payer: Self-pay

## 2020-04-03 ENCOUNTER — Emergency Department (HOSPITAL_COMMUNITY)
Admission: EM | Admit: 2020-04-03 | Discharge: 2020-04-03 | Disposition: A | Discharge status: Home / Self Care | Payer: Medicare Other | Attending: Emergency Medicine | Admitting: Emergency Medicine

## 2020-04-03 ENCOUNTER — Ambulatory Visit (HOSPITAL_COMMUNITY): Admission: RE | Admit: 2020-04-03 | Payer: Medicare Other | Source: Ambulatory Visit

## 2020-04-03 ENCOUNTER — Emergency Department (HOSPITAL_COMMUNITY): Payer: Medicare Other

## 2020-04-03 ENCOUNTER — Other Ambulatory Visit: Payer: Self-pay

## 2020-04-03 DIAGNOSIS — R0789 Other chest pain: Secondary | ICD-10-CM | POA: Diagnosis not present

## 2020-04-03 DIAGNOSIS — Z87891 Personal history of nicotine dependence: Secondary | ICD-10-CM | POA: Diagnosis not present

## 2020-04-03 DIAGNOSIS — I110 Hypertensive heart disease with heart failure: Secondary | ICD-10-CM | POA: Diagnosis not present

## 2020-04-03 DIAGNOSIS — Z91040 Latex allergy status: Secondary | ICD-10-CM | POA: Diagnosis not present

## 2020-04-03 DIAGNOSIS — Z79899 Other long term (current) drug therapy: Secondary | ICD-10-CM | POA: Diagnosis not present

## 2020-04-03 DIAGNOSIS — I509 Heart failure, unspecified: Secondary | ICD-10-CM | POA: Insufficient documentation

## 2020-04-03 DIAGNOSIS — Z7982 Long term (current) use of aspirin: Secondary | ICD-10-CM | POA: Diagnosis not present

## 2020-04-03 DIAGNOSIS — R109 Unspecified abdominal pain: Secondary | ICD-10-CM | POA: Diagnosis present

## 2020-04-03 LAB — URINALYSIS, ROUTINE W REFLEX MICROSCOPIC
Bacteria, UA: NONE SEEN
Bilirubin Urine: NEGATIVE
Glucose, UA: NEGATIVE mg/dL
Hgb urine dipstick: NEGATIVE
Ketones, ur: 5 mg/dL — AB
Leukocytes,Ua: NEGATIVE
Nitrite: NEGATIVE
Protein, ur: NEGATIVE mg/dL
Specific Gravity, Urine: 1.006 (ref 1.005–1.030)
pH: 5 (ref 5.0–8.0)

## 2020-04-03 LAB — CBC WITH DIFFERENTIAL/PLATELET
Abs Immature Granulocytes: 0.04 10*3/uL (ref 0.00–0.07)
Basophils Absolute: 0.1 10*3/uL (ref 0.0–0.1)
Basophils Relative: 1 %
Eosinophils Absolute: 0.1 10*3/uL (ref 0.0–0.5)
Eosinophils Relative: 1 %
HCT: 43.1 % (ref 36.0–46.0)
Hemoglobin: 13.7 g/dL (ref 12.0–15.0)
Immature Granulocytes: 0 %
Lymphocytes Relative: 21 %
Lymphs Abs: 2.1 10*3/uL (ref 0.7–4.0)
MCH: 28.6 pg (ref 26.0–34.0)
MCHC: 31.8 g/dL (ref 30.0–36.0)
MCV: 90 fL (ref 80.0–100.0)
Monocytes Absolute: 0.8 10*3/uL (ref 0.1–1.0)
Monocytes Relative: 9 %
Neutro Abs: 6.7 10*3/uL (ref 1.7–7.7)
Neutrophils Relative %: 68 %
Platelets: 204 10*3/uL (ref 150–400)
RBC: 4.79 MIL/uL (ref 3.87–5.11)
RDW: 15.2 % (ref 11.5–15.5)
WBC: 9.8 10*3/uL (ref 4.0–10.5)
nRBC: 0 % (ref 0.0–0.2)

## 2020-04-03 LAB — COMPREHENSIVE METABOLIC PANEL
ALT: 17 U/L (ref 0–44)
AST: 24 U/L (ref 15–41)
Albumin: 4.4 g/dL (ref 3.5–5.0)
Alkaline Phosphatase: 89 U/L (ref 38–126)
Anion gap: 10 (ref 5–15)
BUN: 21 mg/dL — ABNORMAL HIGH (ref 6–20)
CO2: 25 mmol/L (ref 22–32)
Calcium: 9.2 mg/dL (ref 8.9–10.3)
Chloride: 103 mmol/L (ref 98–111)
Creatinine, Ser: 0.73 mg/dL (ref 0.44–1.00)
GFR, Estimated: >60 mL/min (ref >60)
Glucose, Bld: 122 mg/dL — ABNORMAL HIGH (ref 70–99)
Potassium: 3.8 mmol/L (ref 3.5–5.1)
Sodium: 138 mmol/L (ref 135–145)
Total Bilirubin: 1 mg/dL (ref 0.3–1.2)
Total Protein: 7.2 g/dL (ref 6.5–8.1)

## 2020-04-03 LAB — LIPASE, BLOOD: Lipase: 21 U/L (ref 11–51)

## 2020-04-03 LAB — LACTIC ACID, PLASMA: Lactic Acid, Venous: 0.7 mmol/L (ref 0.5–1.9)

## 2020-04-03 MED ORDER — SODIUM CHLORIDE 0.9 % IV SOLN
1000.0000 mL | INTRAVENOUS | Status: DC
  Administered 2020-04-03: 1000 mL via INTRAVENOUS

## 2020-04-03 MED ORDER — IOHEXOL 350 MG/ML SOLN
100.0000 mL | Freq: Once | INTRAVENOUS | Status: AC | PRN
  Administered 2020-04-03: 100 mL via INTRAVENOUS

## 2020-04-03 MED ORDER — IOHEXOL 9 MG/ML PO SOLN
ORAL | Status: AC
  Administered 2020-04-03: 1000 mL via ORAL
  Filled 2020-04-03: qty 1000

## 2020-04-03 MED ORDER — SODIUM CHLORIDE 0.9 % IV BOLUS (SEPSIS)
1000.0000 mL | Freq: Once | INTRAVENOUS | Status: AC
  Administered 2020-04-03: 1000 mL via INTRAVENOUS

## 2020-04-03 MED ORDER — HYDROMORPHONE HCL 1 MG/ML IJ SOLN
0.5000 mg | Freq: Once | INTRAMUSCULAR | Status: AC
  Administered 2020-04-03: 0.5 mg via INTRAVENOUS
  Filled 2020-04-03: qty 1

## 2020-04-03 MED ORDER — HEPARIN SOD (PORK) LOCK FLUSH 100 UNIT/ML IV SOLN
500.0000 [IU] | Freq: Once | INTRAVENOUS | Status: AC
  Administered 2020-04-03: 500 [IU]
  Filled 2020-04-03: qty 5

## 2020-04-03 MED ORDER — ONDANSETRON HCL 4 MG/2ML IJ SOLN
4.0000 mg | Freq: Once | INTRAMUSCULAR | Status: AC
  Administered 2020-04-03: 4 mg via INTRAVENOUS
  Filled 2020-04-03: qty 2

## 2020-04-03 MED ORDER — IOHEXOL 9 MG/ML PO SOLN: 1000.0000 mL | ORAL | Status: AC

## 2020-04-03 MED ORDER — SODIUM CHLORIDE (PF) 0.9 % IJ SOLN
INTRAMUSCULAR | Status: AC
  Filled 2020-04-03: qty 50

## 2020-04-03 MED ORDER — FENTANYL CITRATE (PF) 100 MCG/2ML IJ SOLN
50.0000 ug | Freq: Once | INTRAMUSCULAR | Status: AC
  Administered 2020-04-03: 50 ug via INTRAVENOUS
  Filled 2020-04-03: qty 2

## 2020-04-03 NOTE — ED Provider Notes (Signed)
[facility] [place] DEPT Provider Note  CSN: [id number] Arrival date & time: 04/03/20 0037  Chief Complaint(s) Flank Pain  HPI Heidi Johnson is a 49 y.o. female with a past medical history listed below including metastatic ovarian cancer who presents to the emergency department with sudden onset left flank pain that began about 46 hours prior to arrival. Pain is been constant and worsening. She is having some mild shortness of breath. Pain worse with movement and palpation. No coughing or congestion. No recent fevers or infections. No abdominal pain. No prior history of DVTs or PEs.  HPI  Past Medical History Past Medical History:  Diagnosis Date  . Cancer ([place])    ovarian cancer  . CHF (congestive heart failure) ([place])   . Hypertension   . Panic attacks   . Stroke [facility])    Patient Active Problem List   Diagnosis Date Noted  . Peritoneal carcinomatosis ([place]) 03/09/2020  . Skin infection 03/09/2020  . Encounter for removal of sutures 01/14/2020  . Abdominal aortic aneurysm (AAA) ([place]) 12/18/2019  . Chronic pain syndrome 12/18/2019  . Essential hypertension 12/18/2019  . History of TIA (transient ischemic attack) 12/18/2019  . Fecal incontinence 12/18/2019  . Peripheral neuropathy due to chemotherapy ([place]) 12/18/2019  . Bipolar disorder ([place]) 12/18/2019  . Goals of care, counseling/discussion 12/18/2019  . Left breast mass 12/18/2019  . Ovarian cancer ([place]) 12/17/2019   Home Medication(s) Prior to Admission medications   Medication Sig Start Date End Date Taking? Authorizing Provider  acetaminophen (TYLENOL) 325 MG tablet Take 325 mg by mouth 4 (four) times daily. 05/09/16  Yes [provider]  albuterol (VENTOLIN HFA) 108 (90 Base) MCG/ACT inhaler Inhale 2 puffs into the lungs as needed. 2 puffs into lungs every 6 hours as needed for wheezing or SOB 12/09/17  Yes [provider]  aspirin 325 MG EC tablet Take 325 mg by mouth  daily.   Yes [provider]  clonazePAM (KLONOPIN) 1 MG tablet Take 1 mg by mouth 3 (three) times daily as needed for anxiety.   Yes [provider]  cyclobenzaprine (FLEXERIL) 5 MG tablet TAKE 1 TABLET (5 MG TOTAL) BY MOUTH 3 (THREE) TIMES DAILY AS NEEDED. 03/11/20  Yes Gorsuch, Ni, MD  DILAUDID 2 MG tablet Take 1 tablet (2 mg total) by mouth 4 (four) times daily as needed. Patient taking differently: Take 2 mg by mouth 4 (four) times daily as needed for severe pain.  03/30/20  Yes Gorsuch, Ni, MD  docusate sodium (COLACE) 100 MG capsule Take 100 mg by mouth 2 (two) times daily.   Yes [provider]  gabapentin (NEURONTIN) 300 MG capsule TAKE 2 CAPSULES IN MORNING, 2 CAPSULES (600 MG) MIDDAY, AND 3 CAPSULES (900 MG) AT BEDTIME. Patient taking differently: See admin instructions. TAKE 2 CAPSULES IN MORNING, 2 CAPSULES (600 MG) MIDDAY, AND 3 CAPSULES (900 MG) AT BEDTIME. 01/20/20  Yes Gorsuch, Ni, MD  hydrochlorothiazide (HYDRODIURIL) 25 MG tablet Take 25 mg by mouth daily.   Yes [provider]  ibuprofen (ADVIL,MOTRIN) 800 MG tablet Take 800 mg by mouth every 8 (eight) hours as needed for mild pain or moderate pain.   Yes [provider]  lidocaine-prilocaine (EMLA) cream Apply 1 application topically as needed (port).  10/14/19  Yes [provider]  lisinopril-hydrochlorothiazide (ZESTORETIC) 20-25 MG tablet Take 1 tablet by mouth 2 (two) times daily.   Yes [provider]  loperamide (IMODIUM A-D) 2 MG tablet Take 2 mg  by mouth 4 (four) times daily as needed for diarrhea or loose stools.   Yes [provider]  MARINOL 5 MG capsule Take 5 mg by mouth 3 (three) times daily. 10/07/19  Yes [provider]  naloxone Karma Greaser) 2 MG/2ML injection Place 2 mg into the nose as needed.  12/26/17  Yes [provider]  naproxen (NAPROSYN) 500 MG tablet Take 500 mg by mouth 2 (two) times daily. 12/02/19  Yes [provider]  OLANZapine (ZYPREXA) 10 MG tablet Take 10 mg by mouth at bedtime. 11/28/19  Yes [provider]  ondansetron (ZOFRAN) 8 MG tablet Take 8 mg by mouth every 8 (eight) hours as needed for nausea.    Yes [provider]  rosuvastatin (CRESTOR) 40 MG tablet Take 40 mg by mouth daily. 12/14/19  Yes [provider]  simethicone (MYLICON) 80 MG chewable tablet Chew 80 mg by mouth every 6 (six) hours as needed for flatulence.    Yes [provider]  cephALEXin (KEFLEX) 500 MG capsule Take 1 capsule (500 mg total) by mouth 3 (three) times daily. Patient not taking: Reported on 04/03/2020 03/09/20   [name], MD                                                                                                                                    Past Surgical History Past Surgical History:  Procedure Laterality Date  . ABDOMINAL HYSTERECTOMY    . BACK SURGERY    . TUBAL LIGATION     Family History Family History  Problem Relation Age of Onset  . Cancer Maternal Aunt        breast ca/GYN    Social History Social History   Tobacco Use  . Smoking status: Former Smoker    Types: Cigarettes    Quit date: 06/20/2018    Years since quitting: 1.7  . Smokeless tobacco: Never Used  Substance Use Topics  . Alcohol use: No  . Drug use: Yes    Types: Marijuana   Allergies Latex and Sulfamethoxazole-trimethoprim  Review of Systems Review of Systems All other systems are reviewed and are negative for acute change except as noted in the HPI  Physical Exam Vital Signs  I have reviewed the triage vital signs BP (!) 141/87   Pulse 73   Temp 98.3 F (36.8 C) (Oral)   Resp 14   Ht 5\' 9"  (1.753 m)   Wt 84.4 kg   SpO2 97%   BMI 27.47 kg/m   Physical Exam Vitals reviewed.  Constitutional:      General: She is not in acute distress.    Appearance: She is well-developed. She is not diaphoretic.  HENT:     Head: Normocephalic and atraumatic.     Nose: Nose  normal.  Eyes:     General: No scleral icterus.       Right eye: No discharge.  Left eye: No discharge.     Conjunctiva/sclera: Conjunctivae normal.     Pupils: Pupils are equal, round, and reactive to light.  Cardiovascular:     Rate and Rhythm: Normal rate and regular rhythm.     Heart sounds: No murmur heard.  No friction rub. No gallop.   Pulmonary:     Effort: Pulmonary effort is normal. No respiratory distress.     Breath sounds: Normal breath sounds. No stridor. No rales.  Chest:     Chest wall: Tenderness present.    Abdominal:     General: There is no distension.     Palpations: Abdomen is soft.     Tenderness: There is no abdominal tenderness.  Musculoskeletal:        General: No tenderness.     Cervical back: Normal range of motion and neck supple.  Skin:    General: Skin is warm and dry.     Findings: No erythema or rash.  Neurological:     Mental Status: She is alert and oriented to person, place, and time.     ED Results and Treatments Labs (all labs ordered are listed, but only abnormal results are displayed) Labs Reviewed  COMPREHENSIVE METABOLIC PANEL - Abnormal; Notable for the following components:      Result Value   Glucose, Bld 122 (*)    BUN 21 (*)    All other components within normal limits  URINALYSIS, ROUTINE W REFLEX MICROSCOPIC - Abnormal; Notable for the following components:   Color, Urine STRAW (*)    Ketones, ur 5 (*)    All other components within normal limits  CBC WITH DIFFERENTIAL/PLATELET  LIPASE, BLOOD  LACTIC ACID, PLASMA                                                                                                                         EKG  EKG Interpretation  Date/Time:  Friday April 03 2020 02:10:36 EDT Ventricular Rate:  88 PR Interval:    QRS Duration: 111 QT Interval:  361 QTC Calculation: 437 R Axis:   70 Text Interpretation: Sinus rhythm No acute changes Confirmed by Addison Lank [phone]) on  04/03/2020 4:27:00 AM      Radiology CT Angio Chest PE W and/or Wo Contrast  Addendum Date: 04/03/2020   ADDENDUM REPORT: 04/03/2020 05:47 ADDENDUM: 3.1 x 1.8 cm nodular soft tissue lesion in the lateral left breast is stable in the interval. Correlation with mammographic history recommended. Electronically Signed   By: Misty Stanley M.D.   On: 04/03/2020 05:47   Result Date: 04/03/2020 CLINICAL DATA:  Left flank pain. PE suspected. High probability. History of ovarian cancer. EXAM: CT ANGIOGRAPHY CHEST WITH CONTRAST TECHNIQUE: Multidetector CT imaging of the chest was performed using the standard protocol during bolus administration of intravenous contrast. Multiplanar CT image reconstructions and MIPs were obtained to evaluate the vascular anatomy. CONTRAST:  111mL OMNIPAQUE IOHEXOL 350 MG/ML SOLN COMPARISON:  Standard CT chest 01/09/2020 FINDINGS:  Cardiovascular: The heart size is normal. No substantial pericardial effusion. Atherosclerotic calcification is noted in the wall of the thoracic aorta. No filling defects within the opacified pulmonary arteries to suggest the presence of an acute pulmonary embolus. Evaluation of distal segmental and subsegmental pulmonary arteries in the lower lobes is prominently degraded by motion artifact. Left Port-A-Cath tip is positioned in the right atrium. Mediastinum/Nodes: Calcified mediastinal lymph nodes are stable in the interval. 9 mm calcified anterior juxta cardiac node is stable. There is no hilar lymphadenopathy. The esophagus has normal imaging features. There is no axillary lymphadenopathy. Lungs/Pleura: Calcified nodular pleural disease in the left hemithorax is stable with plaque-like appearance in the medial left upper lobe. Chronic pleural fluid/thickening in the posterior left costophrenic sulcus is not substantially changed since prior. No new suspicious pulmonary nodule or mass. Upper Abdomen: See the report for abdomen pelvis CT performed at the  same time. Musculoskeletal: No worrisome lytic or sclerotic osseous abnormality. Review of the MIP images confirms the above findings. IMPRESSION: 1. No CT evidence for acute pulmonary embolus. Evaluation of distal segmental and subsegmental pulmonary arteries in the lower lobes is prominently degraded by motion artifact but assessment may not be reliable. 2. Stable appearance of calcified nodular pleural disease in the left hemithorax consistent with treated metastatic disease in this patient with a history of ovarian cancer. 3. Stable chronic pleural fluid/thickening in the posterior left costophrenic sulcus. 4. Aortic Atherosclerosis (ICD10-I70.0). Electronically Signed: By: Misty Stanley M.D. On: 04/03/2020 05:23   CT ABDOMEN PELVIS W CONTRAST  Result Date: 04/03/2020 CLINICAL DATA:  Left upper quadrant pain with nausea vomiting. History of metastatic ovarian cancer. EXAM: CT ABDOMEN AND PELVIS WITH CONTRAST TECHNIQUE: Multidetector CT imaging of the abdomen and pelvis was performed using the standard protocol following bolus administration of intravenous contrast. CONTRAST:  136mL OMNIPAQUE IOHEXOL 350 MG/ML SOLN COMPARISON:  01/09/2020 FINDINGS: Lower chest: See report for insert CT angio chest exam performed at the same time. Hepatobiliary: Similar appearance 1.6 x 1.2 cm low-density lesion lateral segment left liver. Calcified lesion along the falciform ligament is unchanged. Gallbladder surgically absent. No intrahepatic or extrahepatic biliary dilation. Pancreas: No focal mass lesion. No dilatation of the main duct. No intraparenchymal cyst. No peripancreatic edema. Spleen: No splenomegaly. No focal mass lesion. Adrenals/Urinary Tract: No adrenal nodule or mass. Kidneys unremarkable. No evidence for hydroureter. The urinary bladder appears normal for the degree of distention. Stomach/Bowel: Stomach is unremarkable. No gastric wall thickening. No evidence of outlet obstruction. Duodenum is normally  positioned as is the ligament of Treitz. No small bowel wall thickening. No small bowel dilatation. The terminal ileum is normal. The appendix is not visualized, but there is no edema or inflammation in the region of the cecum. No gross colonic mass. No colonic wall thickening. Vascular/Lymphatic: There is abdominal aortic atherosclerosis. Infrarenal abdominal aorta measures 3.5 cm diameter. The portal vein, superior mesenteric vein and splenic vein are patent. There is no gastrohepatic or hepatoduodenal ligament lymphadenopathy. No retroperitoneal or mesenteric lymphadenopathy. No pelvic sidewall lymphadenopathy. Reproductive: Uterus surgically absent. Soft tissue fullness in the right adnexal space (66/2) is similar to prior. Other: No intraperitoneal free fluid. Calcified omental nodules again noted, stable in the interval. Similar stable calcified perigastric and mesenteric nodules. Musculoskeletal: No worrisome lytic or sclerotic osseous abnormality. IMPRESSION: 1. No acute findings in the abdomen or pelvis. Specifically, no findings to explain the patient's history of left upper quadrant pain with nausea and vomiting. 2. Stable appearance of calcified omental  and mesenteric nodules consistent with treated metastatic disease. Soft tissue fullness seen in the right adnexal region on the prior exam is similar today. 3. Stable 1.6 x 1.2 cm low-density lesion lateral segment left liver. 4. 3.5 cm infrarenal abdominal aortic aneurysm. Recommend follow-up ultrasound every 2 years. This recommendation follows ACR consensus guidelines: White Paper of the ACR Incidental Findings Committee II on Vascular Findings. J Am Coll Radiol 2013; 80:998-338. 5. Aortic Atherosclerosis (ICD10-I70.0). Electronically Signed   By: Misty Stanley M.D.   On: 04/03/2020 05:46    Pertinent labs & imaging results that were available during my care of the patient were reviewed by me and considered in my medical decision making (see chart  for details).  Medications Ordered in ED Medications  sodium chloride 0.9 % bolus 1,000 mL (0 mLs Intravenous Stopped 04/03/20 0441)    Followed by  0.9 %  sodium chloride infusion (1,000 mLs Intravenous New Bag/Given 04/03/20 0605)  heparin lock flush 100 unit/mL (has no administration in time range)  fentaNYL (SUBLIMAZE) injection 50 mcg (50 mcg Intravenous Given 04/03/20 0142)  ondansetron (ZOFRAN) injection 4 mg (4 mg Intravenous Given 04/03/20 0143)  HYDROmorphone (DILAUDID) injection 0.5 mg (0.5 mg Intravenous Given 04/03/20 0220)  iohexol (OMNIPAQUE) 9 MG/ML oral solution 1,000 mL (1,000 mLs Oral Contrast Given 04/03/20 0208)  sodium chloride (PF) 0.9 % injection (  Given by Other 04/03/20 0605)  iohexol (OMNIPAQUE) 350 MG/ML injection 100 mL (100 mLs Intravenous Contrast Given 04/03/20 0443)  HYDROmorphone (DILAUDID) injection 0.5 mg (0.5 mg Intravenous Given 04/03/20 0622)                                                                                                                                    Procedures Procedures  (including critical care time)  Medical Decision Making / ED Course I have reviewed the nursing notes for this encounter and the patient's prior records (if available in EHR or on provided paperwork).   Heidi Johnson was evaluated in Emergency Department on 04/03/2020 for the symptoms described in the history of present illness. She was evaluated in the context of the global COVID-19 pandemic, which necessitated consideration that the patient might be at risk for infection with the SARS-CoV-2 virus that causes COVID-19. Institutional protocols and algorithms that pertain to the evaluation of patients at risk for COVID-19 are in a state of rapid change based on information released by regulatory bodies including the CDC and federal and state organizations. These policies and algorithms were followed during the patient's care in the ED.  Left lower  chest/flank pain EKG nonischemic. Low suspicion for ACS. Not classic for aortic dissection or esophageal perforation.  On review of records, patient has metastatic disease to the left lower chest.   This may be contributing to the patient's pain however will need to rule out PE. Patient is scheduled for CT of the abdomen in the morning. We'll  go ahead and obtain it while she is here.  Screening labs were obtained and grossly reassuring without leukocytosis or anemia. No significant electrolyte derangements or renal sufficiency. No evidence of bili obstruction or pancreatitis. UA without evidence of infection or hematuria.  CTA PE and abdomen and pelvis were negative for any acute changes. She does have stable metastatic disease.  She was treated symptomatically with IV fluids any pain medicine which provided the patient with significant relief.  She has follow-up with oncology and there is a next week.      Final Clinical Impression(s) / ED Diagnoses Final diagnoses:  Left flank pain   The patient appears reasonably screened and/or stabilized for discharge and I doubt any other medical condition or other [facility] requiring further screening, evaluation, or treatment in the ED at this time prior to discharge. Safe for discharge with strict return precautions.  Disposition: Discharge  Condition: Good  I have discussed the results, Dx and Tx plan with the patient/family who expressed understanding and agree(s) with the plan. Discharge instructions discussed at length. The patient/family was given strict return precautions who verbalized understanding of the instructions. No further questions at time of discharge.    ED Discharge Orders    None      Follow Up: [name], MD [place] [id number] [phone]   as scheduled      This chart was dictated using voice recognition software.  Despite best efforts to proofread,  errors can occur which  can change the documentation meaning.   Fatima Blank, MD 04/03/20 [phone]

## 2020-04-03 NOTE — ED Notes (Signed)
Patient has history of ovarian cancer, currently not doing chemo/radiation. Patient scheduled for a CT abdomen pelvis at 0730 this morning. Patient supposed to start drinking contrast at 0530, another at 0630, and scan at 0730.

## 2020-04-03 NOTE — ED Triage Notes (Signed)
Patient BIB EMS for left sided flank pain that started at 1900 last night. Patient denies nausea/vomiting/diarrhea/fevers. Patient took 2mg  Dilaudid and three Gabapentin at 2000 last night with no relief. Patient denies any previous episodes of this pain. Patient denies any pain or burning with urination, patient had UTI last week and finished antibiotic four days ago for same. Patient is AxOx4, hypertensive at this time.

## 2020-04-06 ENCOUNTER — Other Ambulatory Visit: Payer: Self-pay

## 2020-04-06 ENCOUNTER — Encounter: Payer: Medicare Other | Admitting: Physical Therapy

## 2020-04-06 ENCOUNTER — Inpatient Hospital Stay (HOSPITAL_BASED_OUTPATIENT_CLINIC_OR_DEPARTMENT_OTHER): Payer: Medicare Other | Admitting: Hematology and Oncology

## 2020-04-06 ENCOUNTER — Encounter: Payer: Self-pay | Admitting: Hematology and Oncology

## 2020-04-06 VITALS — BP 212/114 | HR 81 | Temp 97.5°F | Resp 18 | Ht 69.0 in | Wt 192.6 lb

## 2020-04-06 DIAGNOSIS — C569 Malignant neoplasm of unspecified ovary: Secondary | ICD-10-CM

## 2020-04-06 DIAGNOSIS — G893 Neoplasm related pain (acute) (chronic): Secondary | ICD-10-CM | POA: Diagnosis not present

## 2020-04-06 DIAGNOSIS — T451X5A Adverse effect of antineoplastic and immunosuppressive drugs, initial encounter: Secondary | ICD-10-CM

## 2020-04-06 DIAGNOSIS — I714 Abdominal aortic aneurysm, without rupture, unspecified: Secondary | ICD-10-CM

## 2020-04-06 DIAGNOSIS — M898X9 Other specified disorders of bone, unspecified site: Secondary | ICD-10-CM

## 2020-04-06 DIAGNOSIS — I10 Essential (primary) hypertension: Secondary | ICD-10-CM

## 2020-04-06 DIAGNOSIS — G894 Chronic pain syndrome: Secondary | ICD-10-CM | POA: Diagnosis not present

## 2020-04-06 DIAGNOSIS — G620 Drug-induced polyneuropathy: Secondary | ICD-10-CM

## 2020-04-06 MED ORDER — GABAPENTIN 300 MG PO CAPS
ORAL_CAPSULE | ORAL | 4 refills | Status: DC
Start: 2020-04-06 — End: 2020-04-24

## 2020-04-06 NOTE — Assessment & Plan Note (Signed)
We discussed chronic pain management We discussed narcotic refill policy Given her severe bone pain, I think it is not unreasonable to pursue bone scan for further evaluation and rule out bone metastasis

## 2020-04-06 NOTE — Assessment & Plan Note (Signed)
She has of worsening blood pressure due to uncontrolled pain She will continue aggressive blood pressure management at home

## 2020-04-06 NOTE — Assessment & Plan Note (Signed)
This is stable with aggressive blood pressure management

## 2020-04-06 NOTE — Progress Notes (Signed)
[place] OFFICE PROGRESS NOTE  Patient Care Team: Pcp, No as PCP - General  ASSESSMENT & PLAN:  Ovarian cancer ([place]) I have reviewed multiple imaging studies with the patient and her daughter despite elevated tumor markers, there is no conclusive evidence of metastatic disease I suspect the persistent pleural effusion might have caused elevated tumor markers She complained of severe bone pain throughout It is very unusual to have metastatic disease to the bone although not impossible Per patient request, I would proceed with bone scan for further evaluation  Abdominal aortic aneurysm (AAA) ([place]) This is stable with aggressive blood pressure management  Chronic pain syndrome We discussed chronic pain management We discussed narcotic refill policy Given her severe bone pain, I think it is not unreasonable to pursue bone scan for further evaluation and rule out bone metastasis  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 NM Bone Scan Whole Body    Standing Status:   Future    Standing Expiration Date:   04/06/2021    Order Specific Question:   If indicated for the ordered procedure, I authorize the administration of a radiopharmaceutical per Radiology protocol    Answer:   Yes    Order Specific Question:   Is the patient pregnant?    Answer:   No    Order Specific Question:   Preferred imaging location?    Answer:   [facility]    All questions were  answered. The patient knows to call the clinic with any problems, questions or concerns. The total time spent in the appointment was 55 minutes encounter with patients including review of chart and various tests results, discussions about plan of care and coordination of care plan   [name], MD 04/06/2020 2:46 PM  INTERVAL HISTORY: Please see below for problem oriented charting. She returns with daughter to review test results She went to the emergency department last week due to severe uncontrolled pain She had CT imaging done which shows stable findings She complained of severe peripheral neuropathy, worse since last time I saw her Her pharmacist stated that her insurance company refused to pay for her current prescribed gabapentin She have diffuse musculoskeletal pain and is wondering whether she might have metastatic disease to her bone She denies recent infection, fever or chills Denies changes in bowel habits  SUMMARY OF ONCOLOGIC HISTORY: Oncology History Overview Note  Negative genetics at [facility]   Ovarian cancer ([place])  04/25/2016 Imaging   CT confirms 12cm ovarian masses with extensive peritoneal metastases and malignant pleural effusions   05/05/2016 Surgery   s/p ex-lap posterior exenteration, diaphragm stripping, cholecystectomy, omentectomy, appendectomy and optimal tumor debulking on 05/05/16   06/24/2016 - 10/24/2016 Chemotherapy   Started Taxol/Carboplatin. Dose reduced Taxol with cycle 4 for neuropathy, Taxol discontinued cycle 5   10/24/2016 - 08/20/2018 Anti-estrogen oral therapy   started maintenance letrozole   08/20/2018 -  Chemotherapy   The patient had maintenance Avastin, discontinued due to TIA and uncontrolled hypertension    02/04/2019  Tumor Marker   Patient's tumor was tested for the following markers: CA-125 Results of the tumor marker test revealed 167   02/05/2019 Imaging   Outside CT chest  1.  Numerous left predominant calcified pleural metastases and  associated small left pleural effusion are unchanged.  2.  Unchanged (3.0 cm) (series 3, image 96) left breast mass    02/07/2019 Imaging   Outside Ct abdomen and pelvis 1. Overall stable small volume peritoneal tumor, as described above.  2. Unchanged infrarenal abdominal aortic aneurysm.    02/22/2019 Tumor Marker   Patient's tumor was tested for the following markers: CA-125 Results of the tumor marker test revealed 164   03/18/2019 Tumor Marker   Patient's tumor was tested for the following markers: CA-125 Results of the tumor marker test revealed 162   04/08/2019 Tumor Marker   Patient's tumor was tested for the following markers: CA-125 Results of the tumor marker test revealed 164   04/29/2019 Tumor Marker   Patient's tumor was tested for the following markers: CA-125 Results of the tumor marker test revealed 181   05/20/2019 Tumor Marker   Patient's tumor was tested for the following markers: CA-125 Results of the tumor marker test revealed 164   06/10/2019 Tumor Marker   Patient's tumor was tested for the following markers: CA-125 Results of the tumor marker test revealed 152   07/01/2019 Tumor Marker   Patient's tumor was tested for the following markers: CA-125 Results of the tumor marker test revealed 160   07/22/2019 Tumor Marker   Patient's tumor was tested for the following markers: CA-125 Results of the tumor marker test revealed 171   08/12/2019 Tumor Marker   Patient's tumor was tested for the following markers: CA-125 Results of the tumor marker test revealed 173   09/02/2019 Tumor Marker   Patient's tumor was tested for the following markers: CA-125 Results of the tumor marker test revealed 159   09/30/2019 Tumor Marker   Patient's tumor was tested for the following markers: CA-125 Results of the tumor marker test revealed 168   11/08/2019 Imaging   Outside CT imaging 1. Study was performed at outside institution on 11/08/2019 and presented for review on  11/14/2019.  2. Stable calcified and noncalcified left-sided nodular and plaque-like pleural abnormalities with small chronic left pleural effusion compatible with stable treated pleural metastasis. No new or enlarging lesions.  3. No enlarging thoracic lymph nodes.  4. Redemonstration of left breast mass. Correlation with mammography is suggested if not already performed   12/02/2019 Tumor Marker   Patient's tumor was tested for the following markers: CA-125 Results of the tumor marker test revealed 186   12/18/2019 Cancer Staging   Staging form: Ovary, Fallopian Tube, and Primary Peritoneal Carcinoma, AJCC 8th Edition - Clinical stage from 12/18/2019: FIGO Stage IIIC (ycT3c, cN0, cM0) - Signed by [name], MD on 12/18/2019   01/09/2020 Tumor Marker   Patient's tumor was tested for the following markers: CA-125 Results of the tumor marker test revealed 184   01/10/2020 Imaging   1. Treated left pleural and peritoneal metastatic disease. Areas of residual soft tissue prominence in the right anatomic pelvis are indeterminate. Comparison with prior exams would be helpful. 2. Hepatomegaly. Low-attenuation lesions in the liver are too small to characterize. Comparison with prior exams would be helpful. 3. Small left fibrothorax. 4. Infrarenal Aortic aneurysm NOS (ICD10-I71.9). Recommend followup by ultrasound in 2 years.  5. Aortic atherosclerosis (ICD10-I70.0). Coronary artery calcification.   03/09/2020 Tumor  Marker   Patient's tumor was tested for the following markers: CA-125 Results of the tumor marker test revealed 214   04/01/2020 Tumor Marker   Patient's tumor was tested for the following markers: CA-125 Results of the tumor marker test revealed 245.   04/03/2020 Imaging   1. No acute findings in the abdomen or pelvis. Specifically, no findings to explain the patient's history of left upper quadrant pain with nausea and vomiting. 2. Stable appearance of calcified omental and  mesenteric nodules consistent with treated metastatic disease. Soft tissue fullness seen in the right adnexal region on the prior exam is similar today. 3. Stable 1.6 x 1.2 cm low-density lesion lateral segment left liver. 4. 3.5 cm infrarenal abdominal aortic aneurysm. Recommend follow-up ultrasound every 2 years. This recommendation follows ACR consensus guidelines: White Paper of the ACR Incidental Findings Committee II on Vascular Findings. J Am Coll Radiol 2013; 15:400-867. 5. Aortic Atherosclerosis (ICD10-I70.0).     REVIEW OF SYSTEMS:   Constitutional: Denies fevers, chills or abnormal weight loss Eyes: Denies blurriness of vision Ears, nose, mouth, throat, and face: Denies mucositis or sore throat Respiratory: Denies cough, dyspnea or wheezes Cardiovascular: Denies palpitation, chest discomfort or lower extremity swelling Gastrointestinal:  Denies nausea, heartburn or change in bowel habits Skin: Denies abnormal skin rashes Lymphatics: Denies new lymphadenopathy or easy bruising Behavioral/Psych: Mood is stable, no new changes  All other systems were reviewed with the patient and are negative.  I have reviewed the past medical history, past surgical history, social history and family history with the patient and they are unchanged from previous note.  ALLERGIES:  is allergic to latex and sulfamethoxazole-trimethoprim.  MEDICATIONS:  Current Outpatient Medications  Medication Sig Dispense Refill  . acetaminophen (TYLENOL) 325 MG tablet Take 325 mg by mouth 4 (four) times daily.    Marland Kitchen albuterol (VENTOLIN HFA) 108 (90 Base) MCG/ACT inhaler Inhale 2 puffs into the lungs as needed. 2 puffs into lungs every 6 hours as needed for wheezing or SOB    . aspirin 325 MG EC tablet Take 325 mg by mouth daily.    . cephALEXin (KEFLEX) 500 MG capsule Take 1 capsule (500 mg total) by mouth 3 (three) times daily. (Patient not taking: Reported on 04/03/2020) 21 capsule 0  . clonazePAM (KLONOPIN) 1  MG tablet Take 1 mg by mouth 3 (three) times daily as needed for anxiety.    . cyclobenzaprine (FLEXERIL) 5 MG tablet TAKE 1 TABLET (5 MG TOTAL) BY MOUTH 3 (THREE) TIMES DAILY AS NEEDED. 60 tablet 1  . DILAUDID 2 MG tablet Take 1 tablet (2 mg total) by mouth 4 (four) times daily as needed. (Patient taking differently: Take 2 mg by mouth 4 (four) times daily as needed for severe pain. ) 90 tablet 0  . docusate sodium (COLACE) 100 MG capsule Take 100 mg by mouth 2 (two) times daily.    Marland Kitchen gabapentin (NEURONTIN) 300 MG capsule TAKE 2 CAPSULES IN MORNING, 2 CAPSULES (600 MG) MIDDAY, AND 3 CAPSULES (900 MG) AT BEDTIME. 180 capsule 4  . hydrochlorothiazide (HYDRODIURIL) 25 MG tablet Take 25 mg by mouth daily.    Marland Kitchen ibuprofen (ADVIL,MOTRIN) 800 MG tablet Take 800 mg by mouth every 8 (eight) hours as needed for mild pain or moderate pain.    Marland Kitchen lidocaine-prilocaine (EMLA) cream Apply 1 application topically as needed (port).     Marland Kitchen lisinopril-hydrochlorothiazide (ZESTORETIC) 20-25 MG tablet Take 1 tablet by mouth 2 (two) times daily.    Marland Kitchen loperamide (  IMODIUM A-D) 2 MG tablet Take 2 mg by mouth 4 (four) times daily as needed for diarrhea or loose stools.    Marland Kitchen MARINOL 5 MG capsule Take 5 mg by mouth 3 (three) times daily.    . naloxone (NARCAN) 2 MG/2ML injection Place 2 mg into the nose as needed.     . naproxen (NAPROSYN) 500 MG tablet Take 500 mg by mouth 2 (two) times daily.    Marland Kitchen OLANZapine (ZYPREXA) 10 MG tablet Take 10 mg by mouth at bedtime.    . ondansetron (ZOFRAN) 8 MG tablet Take 8 mg by mouth every 8 (eight) hours as needed for nausea.     . rosuvastatin (CRESTOR) 40 MG tablet Take 40 mg by mouth daily.    . simethicone (MYLICON) 80 MG chewable tablet Chew 80 mg by mouth every 6 (six) hours as needed for flatulence.      No current facility-administered medications for this visit.    PHYSICAL EXAMINATION: ECOG PERFORMANCE STATUS: 1 - Symptomatic but completely ambulatory  Vitals:   04/06/20  1121  BP: (!) 212/114  Pulse: 81  Resp: 18  Temp: (!) 97.5 F (36.4 C)  SpO2: 100%   Filed Weights   04/06/20 1121  Weight: 192 lb 9.6 oz (87.4 kg)    GENERAL:alert, no distress and comfortable Musculoskeletal:no cyanosis of digits and no clubbing  NEURO: alert & oriented x 3 with fluent speech, no focal motor/sensory deficits  LABORATORY DATA:  I have reviewed the data as listed    Component Value Date/Time   NA 138 04/03/2020 0145   K 3.8 04/03/2020 0145   CL 103 04/03/2020 0145   CO2 25 04/03/2020 0145   GLUCOSE 122 (H) 04/03/2020 0145   BUN 21 (H) 04/03/2020 0145   CREATININE 0.73 04/03/2020 0145   CREATININE 0.71 01/09/2020 1404   CALCIUM 9.2 04/03/2020 0145   PROT 7.2 04/03/2020 0145   ALBUMIN 4.4 04/03/2020 0145   AST 24 04/03/2020 0145   AST 27 01/09/2020 1404   ALT 17 04/03/2020 0145   ALT 27 01/09/2020 1404   ALKPHOS 89 04/03/2020 0145   BILITOT 1.0 04/03/2020 0145   BILITOT 0.5 01/09/2020 1404   GFRNONAA >60 04/03/2020 0145   GFRNONAA >60 01/09/2020 1404   GFRAA >60 03/09/2020 1005   GFRAA >60 01/09/2020 1404    No results found for: SPEP, UPEP  Lab Results  Component Value Date   WBC 9.8 04/03/2020   NEUTROABS 6.7 04/03/2020   HGB 13.7 04/03/2020   HCT 43.1 04/03/2020   MCV 90.0 04/03/2020   PLT 204 04/03/2020      Chemistry      Component Value Date/Time   NA 138 04/03/2020 0145   K 3.8 04/03/2020 0145   CL 103 04/03/2020 0145   CO2 25 04/03/2020 0145   BUN 21 (H) 04/03/2020 0145   CREATININE 0.73 04/03/2020 0145   CREATININE 0.71 01/09/2020 1404      Component Value Date/Time   CALCIUM 9.2 04/03/2020 0145   ALKPHOS 89 04/03/2020 0145   AST 24 04/03/2020 0145   AST 27 01/09/2020 1404   ALT 17 04/03/2020 0145   ALT 27 01/09/2020 1404   BILITOT 1.0 04/03/2020 0145   BILITOT 0.5 01/09/2020 1404       RADIOGRAPHIC STUDIES: I have reviewed multiple imaging studies with the patient and daughter I have personally reviewed the  radiological images as listed and agreed with the findings in the report. CT Angio Chest PE  W and/or Wo Contrast  Addendum Date: 04/03/2020   ADDENDUM REPORT: 04/03/2020 05:47 ADDENDUM: 3.1 x 1.8 cm nodular soft tissue lesion in the lateral left breast is stable in the interval. Correlation with mammographic history recommended. Electronically Signed   By: Misty Stanley M.D.   On: 04/03/2020 05:47   Result Date: 04/03/2020 CLINICAL DATA:  Left flank pain. PE suspected. High probability. History of ovarian cancer. EXAM: CT ANGIOGRAPHY CHEST WITH CONTRAST TECHNIQUE: Multidetector CT imaging of the chest was performed using the standard protocol during bolus administration of intravenous contrast. Multiplanar CT image reconstructions and MIPs were obtained to evaluate the vascular anatomy. CONTRAST:  16mL OMNIPAQUE IOHEXOL 350 MG/ML SOLN COMPARISON:  Standard CT chest 01/09/2020 FINDINGS: Cardiovascular: The heart size is normal. No substantial pericardial effusion. Atherosclerotic calcification is noted in the wall of the thoracic aorta. No filling defects within the opacified pulmonary arteries to suggest the presence of an acute pulmonary embolus. Evaluation of distal segmental and subsegmental pulmonary arteries in the lower lobes is prominently degraded by motion artifact. Left Port-A-Cath tip is positioned in the right atrium. Mediastinum/Nodes: Calcified mediastinal lymph nodes are stable in the interval. 9 mm calcified anterior juxta cardiac node is stable. There is no hilar lymphadenopathy. The esophagus has normal imaging features. There is no axillary lymphadenopathy. Lungs/Pleura: Calcified nodular pleural disease in the left hemithorax is stable with plaque-like appearance in the medial left upper lobe. Chronic pleural fluid/thickening in the posterior left costophrenic sulcus is not substantially changed since prior. No new suspicious pulmonary nodule or mass. Upper Abdomen: See the report for  abdomen pelvis CT performed at the same time. Musculoskeletal: No worrisome lytic or sclerotic osseous abnormality. Review of the MIP images confirms the above findings. IMPRESSION: 1. No CT evidence for acute pulmonary embolus. Evaluation of distal segmental and subsegmental pulmonary arteries in the lower lobes is prominently degraded by motion artifact but assessment may not be reliable. 2. Stable appearance of calcified nodular pleural disease in the left hemithorax consistent with treated metastatic disease in this patient with a history of ovarian cancer. 3. Stable chronic pleural fluid/thickening in the posterior left costophrenic sulcus. 4. Aortic Atherosclerosis (ICD10-I70.0). Electronically Signed: By: Misty Stanley M.D. On: 04/03/2020 05:23   CT ABDOMEN PELVIS W CONTRAST  Result Date: 04/03/2020 CLINICAL DATA:  Left upper quadrant pain with nausea vomiting. History of metastatic ovarian cancer. EXAM: CT ABDOMEN AND PELVIS WITH CONTRAST TECHNIQUE: Multidetector CT imaging of the abdomen and pelvis was performed using the standard protocol following bolus administration of intravenous contrast. CONTRAST:  128mL OMNIPAQUE IOHEXOL 350 MG/ML SOLN COMPARISON:  01/09/2020 FINDINGS: Lower chest: See report for insert CT angio chest exam performed at the same time. Hepatobiliary: Similar appearance 1.6 x 1.2 cm low-density lesion lateral segment left liver. Calcified lesion along the falciform ligament is unchanged. Gallbladder surgically absent. No intrahepatic or extrahepatic biliary dilation. Pancreas: No focal mass lesion. No dilatation of the main duct. No intraparenchymal cyst. No peripancreatic edema. Spleen: No splenomegaly. No focal mass lesion. Adrenals/Urinary Tract: No adrenal nodule or mass. Kidneys unremarkable. No evidence for hydroureter. The urinary bladder appears normal for the degree of distention. Stomach/Bowel: Stomach is unremarkable. No gastric wall thickening. No evidence of outlet  obstruction. Duodenum is normally positioned as is the ligament of Treitz. No small bowel wall thickening. No small bowel dilatation. The terminal ileum is normal. The appendix is not visualized, but there is no edema or inflammation in the region of the cecum. No gross colonic mass.  No colonic wall thickening. Vascular/Lymphatic: There is abdominal aortic atherosclerosis. Infrarenal abdominal aorta measures 3.5 cm diameter. The portal vein, superior mesenteric vein and splenic vein are patent. There is no gastrohepatic or hepatoduodenal ligament lymphadenopathy. No retroperitoneal or mesenteric lymphadenopathy. No pelvic sidewall lymphadenopathy. Reproductive: Uterus surgically absent. Soft tissue fullness in the right adnexal space (66/2) is similar to prior. Other: No intraperitoneal free fluid. Calcified omental nodules again noted, stable in the interval. Similar stable calcified perigastric and mesenteric nodules. Musculoskeletal: No worrisome lytic or sclerotic osseous abnormality. IMPRESSION: 1. No acute findings in the abdomen or pelvis. Specifically, no findings to explain the patient's history of left upper quadrant pain with nausea and vomiting. 2. Stable appearance of calcified omental and mesenteric nodules consistent with treated metastatic disease. Soft tissue fullness seen in the right adnexal region on the prior exam is similar today. 3. Stable 1.6 x 1.2 cm low-density lesion lateral segment left liver. 4. 3.5 cm infrarenal abdominal aortic aneurysm. Recommend follow-up ultrasound every 2 years. This recommendation follows ACR consensus guidelines: White Paper of the ACR Incidental Findings Committee II on Vascular Findings. J Am Coll Radiol 2013; 93:790-240. 5. Aortic Atherosclerosis (ICD10-I70.0). Electronically Signed   By: Misty Stanley M.D.   On: 04/03/2020 05:46

## 2020-04-06 NOTE — Assessment & Plan Note (Signed)
I have reviewed multiple imaging studies with the patient and her daughter despite elevated tumor markers, there is no conclusive evidence of metastatic disease I suspect the persistent pleural effusion might have caused elevated tumor markers She complained of severe bone pain throughout It is very unusual to have metastatic disease to the bone although not impossible Per patient request, I would proceed with bone scan for further evaluation

## 2020-04-06 NOTE — Assessment & Plan Note (Signed)
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

## 2020-04-09 ENCOUNTER — Ambulatory Visit (HOSPITAL_COMMUNITY): Payer: Medicare Other

## 2020-04-09 ENCOUNTER — Other Ambulatory Visit: Payer: Medicare Other

## 2020-04-10 ENCOUNTER — Ambulatory Visit: Payer: Medicare Other | Admitting: Hematology and Oncology

## 2020-04-14 ENCOUNTER — Other Ambulatory Visit: Payer: Self-pay

## 2020-04-14 ENCOUNTER — Encounter (HOSPITAL_COMMUNITY)
Admission: RE | Admit: 2020-04-14 | Discharge: 2020-04-14 | Disposition: A | Discharge status: Home / Self Care | Payer: Medicare Other | Source: Ambulatory Visit | Attending: Hematology and Oncology | Admitting: Hematology and Oncology

## 2020-04-14 DIAGNOSIS — C569 Malignant neoplasm of unspecified ovary: Secondary | ICD-10-CM | POA: Diagnosis not present

## 2020-04-14 DIAGNOSIS — G894 Chronic pain syndrome: Secondary | ICD-10-CM | POA: Diagnosis present

## 2020-04-14 DIAGNOSIS — M898X9 Other specified disorders of bone, unspecified site: Secondary | ICD-10-CM | POA: Diagnosis present

## 2020-04-14 DIAGNOSIS — G893 Neoplasm related pain (acute) (chronic): Secondary | ICD-10-CM

## 2020-04-14 MED ORDER — TECHNETIUM TC 99M MEDRONATE IV KIT
20.0000 | PACK | Freq: Once | INTRAVENOUS | Status: AC | PRN
  Administered 2020-04-14: 20.1 via INTRAVENOUS

## 2020-04-15 ENCOUNTER — Inpatient Hospital Stay (HOSPITAL_BASED_OUTPATIENT_CLINIC_OR_DEPARTMENT_OTHER): Payer: Medicare Other | Admitting: Hematology and Oncology

## 2020-04-15 ENCOUNTER — Encounter: Payer: Medicare Other | Admitting: Physical Therapy

## 2020-04-15 ENCOUNTER — Other Ambulatory Visit: Payer: Self-pay

## 2020-04-15 ENCOUNTER — Encounter: Payer: Self-pay | Admitting: Hematology and Oncology

## 2020-04-15 ENCOUNTER — Telehealth: Payer: Self-pay | Admitting: Oncology

## 2020-04-15 VITALS — BP 148/106 | HR 94 | Temp 97.9°F | Resp 16 | Ht 69.0 in | Wt 187.0 lb

## 2020-04-15 DIAGNOSIS — G894 Chronic pain syndrome: Secondary | ICD-10-CM | POA: Diagnosis not present

## 2020-04-15 DIAGNOSIS — F411 Generalized anxiety disorder: Secondary | ICD-10-CM | POA: Diagnosis not present

## 2020-04-15 DIAGNOSIS — C569 Malignant neoplasm of unspecified ovary: Secondary | ICD-10-CM | POA: Diagnosis not present

## 2020-04-15 DIAGNOSIS — M25552 Pain in left hip: Secondary | ICD-10-CM | POA: Diagnosis not present

## 2020-04-15 MED ORDER — DEXAMETHASONE 4 MG PO TABS: 4.0000 mg | ORAL_TABLET | Freq: Every day | ORAL | 0 refills | Status: DC

## 2020-04-15 MED ORDER — LORAZEPAM 2 MG PO TABS: ORAL_TABLET | ORAL | 0 refills | Status: DC

## 2020-04-15 NOTE — Assessment & Plan Note (Signed)
I reviewed bone scan with the patient The patient is absolutely certain that she have new hip pain on the left I reviewed the most recent CT imaging The sclerotic lesion is nonspecific I recommend MRI of the left hip for further evaluation If confirmed she had metastatic lesion there, I suspect she could benefit from radiation treatment I recommend MRI imaging next week and see her back afterwards to review test results and she is in agreement

## 2020-04-15 NOTE — Telephone Encounter (Signed)
Heidi Johnson with appointment to see Dr. Alvy Bimler after her MRI.  Apt will be on 04/24/20 at 1 pm.  She verbalized agreement and understanding.

## 2020-04-15 NOTE — Assessment & Plan Note (Signed)
She stated she have generalized anxiety and difficulties tolerating imaging study She is already taking Klonopin In the past, she stated she received intravenous sedated medicine I told her I would not be able to give her intravenous date of medicine but I can give her prescription of lorazepam to take as needed I did warn her about the risk of sedated side effects If she have excessive sleepiness after lorazepam, she is instructed not to take any more doses Given the fact that she is on chronic treatment with Klonopin, I suspect she will need higher dose of lorazepam She is instructed not to drive after taking additional doses of lorazepam

## 2020-04-15 NOTE — Assessment & Plan Note (Signed)
She states she have severe hip pain despite being on gabapentin and Dilaudid I recommend low-dose steroids daily until her next visit

## 2020-04-15 NOTE — Progress Notes (Signed)
[facility] OFFICE PROGRESS NOTE  Patient Care Team: Pcp, No as PCP - General  ASSESSMENT & PLAN:  Ovarian cancer ([place]) I reviewed bone scan with the patient The patient is absolutely certain that she have new hip pain on the left I reviewed the most recent CT imaging The sclerotic lesion is nonspecific I recommend MRI of the left hip for further evaluation If confirmed she had metastatic lesion there, I suspect she could benefit from radiation treatment I recommend MRI imaging next week and see her back afterwards to review test results and she is in agreement  Acute hip pain, left She states she have severe hip pain despite being on gabapentin and Dilaudid I recommend low-dose steroids daily until her next visit  Anxiety, generalized She stated she have generalized anxiety and difficulties tolerating imaging study She is already taking Klonopin In the past, she stated she received intravenous sedated medicine I told her I would not be able to give her intravenous date of medicine but I can give her prescription of lorazepam to take as needed I did warn her about the risk of sedated side effects If she have excessive sleepiness after lorazepam, she is instructed not to take any more doses Given the fact that she is on chronic treatment with Klonopin, I suspect she will need higher dose of lorazepam She is instructed not to drive after taking additional doses of lorazepam   Orders Placed This Encounter  Procedures  . MR HIP LEFT W WO CONTRAST    Standing Status:   Future    Standing Expiration Date:   04/15/2021    Order Specific Question:   If indicated for the ordered procedure, I authorize the administration of contrast media per Radiology protocol    Answer:   Yes    Order Specific Question:   What is the patient's sedation requirement?    Answer:   Anti-anxiety    Order Specific Question:   Does the patient have a pacemaker or implanted devices?    Answer:    No    Order Specific Question:   Preferred imaging location?    Answer:   [facility] (table limit - 550 lbs)    All questions were answered. The patient knows to call the clinic with any problems, questions or concerns. The total time spent in the appointment was 30 minutes encounter with patients including review of chart and various tests results, discussions about plan of care and coordination of care plan   [name], MD 04/15/2020 12:42 PM  INTERVAL HISTORY: Please see below for problem oriented charting. She returns to review test results Her daughter is available over the phone The patient is complaining of severe left hip pain In the past, I recall she have pain lower down her leg but this left hip pain was new Her current prescription pain medicine is not helping She denies recent fall or trauma No neurological deficit on that leg  SUMMARY OF ONCOLOGIC HISTORY: Oncology History Overview Note  Negative genetics at [facility]   Ovarian cancer ([place])  04/25/2016 Imaging   CT confirms 12cm ovarian masses with extensive peritoneal metastases and malignant pleural effusions   05/05/2016 Surgery   s/p ex-lap posterior exenteration, diaphragm stripping, cholecystectomy, omentectomy, appendectomy and optimal tumor debulking on 05/05/16   06/24/2016 - 10/24/2016 Chemotherapy   Started Taxol/Carboplatin. 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  Numerous left predominant calcified pleural metastases and associated small left pleural effusion are unchanged.  2.   Unchanged (3.0 cm) (series 3, image 96) left breast mass    02/07/2019 Imaging   Outside Ct abdomen and pelvis 1. Overall stable small volume peritoneal tumor, as described above.  2. Unchanged infrarenal abdominal aortic aneurysm.    02/22/2019 Tumor Marker   Patient's tumor was tested for the following markers: CA-125 Results of the tumor marker test revealed 164   03/18/2019 Tumor Marker   Patient's tumor was tested for the following markers: CA-125 Results of the tumor marker test revealed 162   04/08/2019 Tumor Marker   Patient's tumor was tested for the following markers: CA-125 Results of the tumor marker test revealed 164   04/29/2019 Tumor Marker   Patient's tumor was tested for the following markers: CA-125 Results of the tumor marker test revealed 181   05/20/2019 Tumor Marker   Patient's tumor was tested for the following markers: CA-125 Results of the tumor marker test revealed 164   06/10/2019 Tumor Marker   Patient's tumor was tested for the following markers: CA-125 Results of the tumor marker test revealed 152   07/01/2019 Tumor Marker   Patient's tumor was tested for the following markers: CA-125 Results of the tumor marker test revealed 160   07/22/2019 Tumor Marker   Patient's tumor was tested for the following markers: CA-125 Results of the tumor marker test revealed 171   08/12/2019 Tumor Marker   Patient's tumor was tested for the following markers: CA-125 Results of the tumor marker test revealed 173   09/02/2019 Tumor Marker   Patient's tumor was tested for the following markers: CA-125 Results of the tumor marker test revealed 159   09/30/2019 Tumor Marker   Patient's tumor was tested for the following markers: CA-125 Results of the tumor marker test revealed 168   11/08/2019 Imaging   Outside CT imaging 1. Study was performed at outside institution on 11/08/2019 and presented for review on 11/14/2019.  2. Stable calcified and noncalcified left-sided  nodular and plaque-like pleural abnormalities with small chronic left pleural effusion compatible with stable treated pleural metastasis. No new or enlarging lesions.  3. No enlarging thoracic lymph nodes.  4. Redemonstration of left breast mass. Correlation with mammography is suggested if not already performed   12/02/2019 Tumor Marker   Patient's tumor was tested for the following markers: CA-125 Results of the tumor marker test revealed 186   12/18/2019 Cancer Staging   Staging form: Ovary, Fallopian Tube, and Primary Peritoneal Carcinoma, AJCC 8th Edition - Clinical stage from 12/18/2019: FIGO Stage IIIC (ycT3c, cN0, cM0) - Signed by [name], MD on 12/18/2019   01/09/2020 Tumor Marker   Patient's tumor was tested for the following markers: CA-125 Results of the tumor marker test revealed 184   01/10/2020 Imaging   1. Treated left pleural and peritoneal metastatic disease. Areas of residual soft tissue prominence in the right anatomic pelvis are indeterminate. Comparison with prior exams would be helpful. 2. Hepatomegaly. Low-attenuation lesions in the liver are too small to characterize. Comparison with prior exams would be helpful. 3. Small  left fibrothorax. 4. Infrarenal Aortic aneurysm NOS (ICD10-I71.9). Recommend followup by ultrasound in 2 years.  5. Aortic atherosclerosis (ICD10-I70.0). Coronary artery calcification.   03/09/2020 Tumor Marker   Patient's tumor was tested for the following markers: CA-125 Results of the tumor marker test revealed 214   04/01/2020 Tumor Marker   Patient's tumor was tested for the following markers: CA-125 Results of the tumor marker test revealed 245.   04/03/2020 Imaging   1. No acute findings in the abdomen or pelvis. Specifically, no findings to explain the patient's history of left upper quadrant pain with nausea and vomiting. 2. Stable appearance of calcified omental and mesenteric nodules consistent with treated metastatic disease. Soft  tissue fullness seen in the right adnexal region on the prior exam is similar today. 3. Stable 1.6 x 1.2 cm low-density lesion lateral segment left liver. 4. 3.5 cm infrarenal abdominal aortic aneurysm. Recommend follow-up ultrasound every 2 years. This recommendation follows ACR consensus guidelines: White Paper of the ACR Incidental Findings Committee II on Vascular Findings. J Am Coll Radiol 2013; 02:542-706. 5. Aortic Atherosclerosis (ICD10-I70.0).   04/15/2020 Imaging   Focus of abnormal uptake is seen involving the greater trochanter of the proximal left femur which corresponds to sclerotic density seen on prior CT scan and is concerning for metastatic disease.     REVIEW OF SYSTEMS:   Constitutional: Denies fevers, chills or abnormal weight loss Eyes: Denies blurriness of vision Ears, nose, mouth, throat, and face: Denies mucositis or sore throat Respiratory: Denies cough, dyspnea or wheezes Cardiovascular: Denies palpitation, chest discomfort or lower extremity swelling Gastrointestinal:  Denies nausea, heartburn or change in bowel habits Skin: Denies abnormal skin rashes Lymphatics: Denies new lymphadenopathy or easy bruising Neurological:Denies numbness, tingling or new weaknesses Behavioral/Psych: Mood is stable, no new changes  All other systems were reviewed with the patient and are negative.  I have reviewed the past medical history, past surgical history, social history and family history with the patient and they are unchanged from previous note.  ALLERGIES:  is allergic to latex and sulfamethoxazole-trimethoprim.  MEDICATIONS:  Current Outpatient Medications  Medication Sig Dispense Refill  . acetaminophen (TYLENOL) 325 MG tablet Take 325 mg by mouth 4 (four) times daily.    Marland Kitchen albuterol (VENTOLIN HFA) 108 (90 Base) MCG/ACT inhaler Inhale 2 puffs into the lungs as needed. 2 puffs into lungs every 6 hours as needed for wheezing or SOB    . aspirin 325 MG EC tablet Take  325 mg by mouth daily.    . clonazePAM (KLONOPIN) 1 MG tablet Take 1 mg by mouth 3 (three) times daily as needed for anxiety.    . cyclobenzaprine (FLEXERIL) 5 MG tablet TAKE 1 TABLET (5 MG TOTAL) BY MOUTH 3 (THREE) TIMES DAILY AS NEEDED. 60 tablet 1  . dexamethasone (DECADRON) 4 MG tablet Take 1 tablet (4 mg total) by mouth daily. Take 1 in the morning with food 14 tablet 0  . DILAUDID 2 MG tablet Take 1 tablet (2 mg total) by mouth 4 (four) times daily as needed. (Patient taking differently: Take 2 mg by mouth 4 (four) times daily as needed for severe pain. ) 90 tablet 0  . docusate sodium (COLACE) 100 MG capsule Take 100 mg by mouth 2 (two) times daily.    Marland Kitchen gabapentin (NEURONTIN) 300 MG capsule TAKE 2 CAPSULES IN MORNING, 2 CAPSULES (600 MG) MIDDAY, AND 3 CAPSULES (900 MG) AT BEDTIME. 180 capsule 4  . hydrochlorothiazide (HYDRODIURIL) 25 MG tablet Take 25  mg by mouth daily.    Marland Kitchen lidocaine-prilocaine (EMLA) cream Apply 1 application topically as needed (port).     Marland Kitchen lisinopril-hydrochlorothiazide (ZESTORETIC) 20-25 MG tablet Take 1 tablet by mouth 2 (two) times daily.    Marland Kitchen loperamide (IMODIUM A-D) 2 MG tablet Take 2 mg by mouth 4 (four) times daily as needed for diarrhea or loose stools.    Marland Kitchen LORazepam (ATIVAN) 2 MG tablet Take 1 pill 1 hour before the scan and another dose 15 to 20 mins before MRI and may take additional doses afterwards if needed 3 tablet 0  . MARINOL 5 MG capsule Take 5 mg by mouth 3 (three) times daily.    . naloxone (NARCAN) 2 MG/2ML injection Place 2 mg into the nose as needed.     Marland Kitchen OLANZapine (ZYPREXA) 10 MG tablet Take 10 mg by mouth at bedtime.    . ondansetron (ZOFRAN) 8 MG tablet Take 8 mg by mouth every 8 (eight) hours as needed for nausea.     . rosuvastatin (CRESTOR) 40 MG tablet Take 40 mg by mouth daily.    . simethicone (MYLICON) 80 MG chewable tablet Chew 80 mg by mouth every 6 (six) hours as needed for flatulence.      No current facility-administered  medications for this visit.    PHYSICAL EXAMINATION: ECOG PERFORMANCE STATUS: 1 - Symptomatic but completely ambulatory  Vitals:   04/15/20 1102  BP: (!) 148/106  Pulse: 94  Resp: 16  Temp: 97.9 F (36.6 C)  SpO2: 100%   Filed Weights   04/15/20 1102  Weight: 187 lb (84.8 kg)    GENERAL:alert, no distress and comfortable NEURO: alert & oriented x 3 with fluent speech, no focal motor/sensory deficits  LABORATORY DATA:  I have reviewed the data as listed    Component Value Date/Time   NA 138 04/03/2020 0145   K 3.8 04/03/2020 0145   CL 103 04/03/2020 0145   CO2 25 04/03/2020 0145   GLUCOSE 122 (H) 04/03/2020 0145   BUN 21 (H) 04/03/2020 0145   CREATININE 0.73 04/03/2020 0145   CREATININE 0.71 01/09/2020 1404   CALCIUM 9.2 04/03/2020 0145   PROT 7.2 04/03/2020 0145   ALBUMIN 4.4 04/03/2020 0145   AST 24 04/03/2020 0145   AST 27 01/09/2020 1404   ALT 17 04/03/2020 0145   ALT 27 01/09/2020 1404   ALKPHOS 89 04/03/2020 0145   BILITOT 1.0 04/03/2020 0145   BILITOT 0.5 01/09/2020 1404   GFRNONAA >60 04/03/2020 0145   GFRNONAA >60 01/09/2020 1404   GFRAA >60 03/09/2020 1005   GFRAA >60 01/09/2020 1404    No results found for: SPEP, UPEP  Lab Results  Component Value Date   WBC 9.8 04/03/2020   NEUTROABS 6.7 04/03/2020   HGB 13.7 04/03/2020   HCT 43.1 04/03/2020   MCV 90.0 04/03/2020   PLT 204 04/03/2020      Chemistry      Component Value Date/Time   NA 138 04/03/2020 0145   K 3.8 04/03/2020 0145   CL 103 04/03/2020 0145   CO2 25 04/03/2020 0145   BUN 21 (H) 04/03/2020 0145   CREATININE 0.73 04/03/2020 0145   CREATININE 0.71 01/09/2020 1404      Component Value Date/Time   CALCIUM 9.2 04/03/2020 0145   ALKPHOS 89 04/03/2020 0145   AST 24 04/03/2020 0145   AST 27 01/09/2020 1404   ALT 17 04/03/2020 0145   ALT 27 01/09/2020 1404   BILITOT 1.0 04/03/2020  0145   BILITOT 0.5 01/09/2020 1404       RADIOGRAPHIC STUDIES: I have reviewed multiple  imaging studies with the patient I have personally reviewed the radiological images as listed and agreed with the findings in the report. CT Angio Chest PE W and/or Wo Contrast  Addendum Date: 04/03/2020   ADDENDUM REPORT: 04/03/2020 05:47 ADDENDUM: 3.1 x 1.8 cm nodular soft tissue lesion in the lateral left breast is stable in the interval. Correlation with mammographic history recommended. Electronically Signed   By: Misty Stanley M.D.   On: 04/03/2020 05:47   Result Date: 04/03/2020 CLINICAL DATA:  Left flank pain. PE suspected. High probability. History of ovarian cancer. EXAM: CT ANGIOGRAPHY CHEST WITH CONTRAST TECHNIQUE: Multidetector CT imaging of the chest was performed using the standard protocol during bolus administration of intravenous contrast. Multiplanar CT image reconstructions and MIPs were obtained to evaluate the vascular anatomy. CONTRAST:  176mL OMNIPAQUE IOHEXOL 350 MG/ML SOLN COMPARISON:  Standard CT chest 01/09/2020 FINDINGS: Cardiovascular: The heart size is normal. No substantial pericardial effusion. Atherosclerotic calcification is noted in the wall of the thoracic aorta. No filling defects within the opacified pulmonary arteries to suggest the presence of an acute pulmonary embolus. Evaluation of distal segmental and subsegmental pulmonary arteries in the lower lobes is prominently degraded by motion artifact. Left Port-A-Cath tip is positioned in the right atrium. Mediastinum/Nodes: Calcified mediastinal lymph nodes are stable in the interval. 9 mm calcified anterior juxta cardiac node is stable. There is no hilar lymphadenopathy. The esophagus has normal imaging features. There is no axillary lymphadenopathy. Lungs/Pleura: Calcified nodular pleural disease in the left hemithorax is stable with plaque-like appearance in the medial left upper lobe. Chronic pleural fluid/thickening in the posterior left costophrenic sulcus is not substantially changed since prior. No new  suspicious pulmonary nodule or mass. Upper Abdomen: See the report for abdomen pelvis CT performed at the same time. Musculoskeletal: No worrisome lytic or sclerotic osseous abnormality. Review of the MIP images confirms the above findings. IMPRESSION: 1. No CT evidence for acute pulmonary embolus. Evaluation of distal segmental and subsegmental pulmonary arteries in the lower lobes is prominently degraded by motion artifact but assessment may not be reliable. 2. Stable appearance of calcified nodular pleural disease in the left hemithorax consistent with treated metastatic disease in this patient with a history of ovarian cancer. 3. Stable chronic pleural fluid/thickening in the posterior left costophrenic sulcus. 4. Aortic Atherosclerosis (ICD10-I70.0). Electronically Signed: By: Misty Stanley M.D. On: 04/03/2020 05:23   NM Bone Scan Whole Body  Result Date: 04/15/2020 CLINICAL DATA:  Back pain.  History of ovarian cancer. EXAM: NUCLEAR MEDICINE WHOLE BODY BONE SCAN TECHNIQUE: Whole body anterior and posterior images were obtained approximately 3 hours after intravenous injection of radiopharmaceutical. RADIOPHARMACEUTICALS:  20.1 mCi Technetium-12m MDP IV COMPARISON:  April 03, 2020. FINDINGS: Focus of uptake is seen projected over the left upper rib cage that most likely represents overlying Port-A-Cath reservoir. There is a focus of abnormal uptake involving the greater trochanter of the proximal left femur which corresponds to sclerotic density seen on prior CT scan and is concerning for possible metastatic disease. Increased uptake is seen involving both feet which most likely represents degenerative change. IMPRESSION: Focus of abnormal uptake is seen involving the greater trochanter of the proximal left femur which corresponds to sclerotic density seen on prior CT scan and is concerning for metastatic disease. Electronically Signed   By: Marijo Conception M.D.   On: 04/15/2020 08:31  CT ABDOMEN  PELVIS W CONTRAST  Result Date: 04/03/2020 CLINICAL DATA:  Left upper quadrant pain with nausea vomiting. History of metastatic ovarian cancer. EXAM: CT ABDOMEN AND PELVIS WITH CONTRAST TECHNIQUE: Multidetector CT imaging of the abdomen and pelvis was performed using the standard protocol following bolus administration of intravenous contrast. CONTRAST:  11mL OMNIPAQUE IOHEXOL 350 MG/ML SOLN COMPARISON:  01/09/2020 FINDINGS: Lower chest: See report for insert CT angio chest exam performed at the same time. Hepatobiliary: Similar appearance 1.6 x 1.2 cm low-density lesion lateral segment left liver. Calcified lesion along the falciform ligament is unchanged. Gallbladder surgically absent. No intrahepatic or extrahepatic biliary dilation. Pancreas: No focal mass lesion. No dilatation of the main duct. No intraparenchymal cyst. No peripancreatic edema. Spleen: No splenomegaly. No focal mass lesion. Adrenals/Urinary Tract: No adrenal nodule or mass. Kidneys unremarkable. No evidence for hydroureter. The urinary bladder appears normal for the degree of distention. Stomach/Bowel: Stomach is unremarkable. No gastric wall thickening. No evidence of outlet obstruction. Duodenum is normally positioned as is the ligament of Treitz. No small bowel wall thickening. No small bowel dilatation. The terminal ileum is normal. The appendix is not visualized, but there is no edema or inflammation in the region of the cecum. No gross colonic mass. No colonic wall thickening. Vascular/Lymphatic: There is abdominal aortic atherosclerosis. Infrarenal abdominal aorta measures 3.5 cm diameter. The portal vein, superior mesenteric vein and splenic vein are patent. There is no gastrohepatic or hepatoduodenal ligament lymphadenopathy. No retroperitoneal or mesenteric lymphadenopathy. No pelvic sidewall lymphadenopathy. Reproductive: Uterus surgically absent. Soft tissue fullness in the right adnexal space (66/2) is similar to prior. Other:  No intraperitoneal free fluid. Calcified omental nodules again noted, stable in the interval. Similar stable calcified perigastric and mesenteric nodules. Musculoskeletal: No worrisome lytic or sclerotic osseous abnormality. IMPRESSION: 1. No acute findings in the abdomen or pelvis. Specifically, no findings to explain the patient's history of left upper quadrant pain with nausea and vomiting. 2. Stable appearance of calcified omental and mesenteric nodules consistent with treated metastatic disease. Soft tissue fullness seen in the right adnexal region on the prior exam is similar today. 3. Stable 1.6 x 1.2 cm low-density lesion lateral segment left liver. 4. 3.5 cm infrarenal abdominal aortic aneurysm. Recommend follow-up ultrasound every 2 years. This recommendation follows ACR consensus guidelines: White Paper of the ACR Incidental Findings Committee II on Vascular Findings. J Am Coll Radiol 2013; 57:473-403. 5. Aortic Atherosclerosis (ICD10-I70.0). Electronically Signed   By: Misty Stanley M.D.   On: 04/03/2020 05:46

## 2020-04-20 ENCOUNTER — Encounter: Payer: Medicare Other | Admitting: Physical Therapy

## 2020-04-23 ENCOUNTER — Inpatient Hospital Stay: Payer: Medicare Other | Attending: Hematology and Oncology

## 2020-04-23 ENCOUNTER — Telehealth: Payer: Self-pay | Admitting: Oncology

## 2020-04-23 ENCOUNTER — Other Ambulatory Visit: Payer: Self-pay

## 2020-04-23 ENCOUNTER — Other Ambulatory Visit: Payer: Self-pay | Admitting: Hematology and Oncology

## 2020-04-23 ENCOUNTER — Ambulatory Visit (HOSPITAL_COMMUNITY)
Admission: RE | Admit: 2020-04-23 | Discharge: 2020-04-23 | Disposition: A | Discharge status: Home / Self Care | Payer: Medicare Other | Source: Ambulatory Visit | Attending: Hematology and Oncology | Admitting: Hematology and Oncology

## 2020-04-23 DIAGNOSIS — M25552 Pain in left hip: Secondary | ICD-10-CM | POA: Insufficient documentation

## 2020-04-23 DIAGNOSIS — G629 Polyneuropathy, unspecified: Secondary | ICD-10-CM | POA: Diagnosis not present

## 2020-04-23 DIAGNOSIS — G894 Chronic pain syndrome: Secondary | ICD-10-CM | POA: Diagnosis not present

## 2020-04-23 DIAGNOSIS — Z5111 Encounter for antineoplastic chemotherapy: Secondary | ICD-10-CM | POA: Diagnosis not present

## 2020-04-23 DIAGNOSIS — Z7952 Long term (current) use of systemic steroids: Secondary | ICD-10-CM | POA: Diagnosis not present

## 2020-04-23 DIAGNOSIS — C569 Malignant neoplasm of unspecified ovary: Secondary | ICD-10-CM | POA: Diagnosis present

## 2020-04-23 DIAGNOSIS — I10 Essential (primary) hypertension: Secondary | ICD-10-CM | POA: Diagnosis not present

## 2020-04-23 DIAGNOSIS — C782 Secondary malignant neoplasm of pleura: Secondary | ICD-10-CM | POA: Diagnosis not present

## 2020-04-23 DIAGNOSIS — I2510 Atherosclerotic heart disease of native coronary artery without angina pectoris: Secondary | ICD-10-CM | POA: Insufficient documentation

## 2020-04-23 DIAGNOSIS — C786 Secondary malignant neoplasm of retroperitoneum and peritoneum: Secondary | ICD-10-CM | POA: Diagnosis not present

## 2020-04-23 DIAGNOSIS — R160 Hepatomegaly, not elsewhere classified: Secondary | ICD-10-CM | POA: Diagnosis not present

## 2020-04-23 DIAGNOSIS — Z79899 Other long term (current) drug therapy: Secondary | ICD-10-CM | POA: Insufficient documentation

## 2020-04-23 DIAGNOSIS — R32 Unspecified urinary incontinence: Secondary | ICD-10-CM | POA: Diagnosis not present

## 2020-04-23 DIAGNOSIS — Z95828 Presence of other vascular implants and grafts: Secondary | ICD-10-CM

## 2020-04-23 MED ORDER — DILAUDID 2 MG PO TABS
2.0000 mg | ORAL_TABLET | Freq: Four times a day (QID) | ORAL | 0 refills | Status: DC | PRN
Start: 2020-04-23 — End: 2020-05-11

## 2020-04-23 MED ORDER — HEPARIN SOD (PORK) LOCK FLUSH 100 UNIT/ML IV SOLN
500.0000 [IU] | Freq: Once | INTRAVENOUS | Status: AC
  Filled 2020-04-23: qty 5

## 2020-04-23 MED ORDER — GADOBUTROL 1 MMOL/ML IV SOLN
8.0000 mL | Freq: Once | INTRAVENOUS | Status: AC | PRN
  Administered 2020-04-23: 8 mL via INTRAVENOUS

## 2020-04-23 MED ORDER — SODIUM CHLORIDE 0.9% FLUSH
10.0000 mL | Freq: Once | INTRAVENOUS | Status: AC
  Administered 2020-04-23: 10 mL
  Filled 2020-04-23: qty 10

## 2020-04-23 NOTE — Telephone Encounter (Signed)
done

## 2020-04-23 NOTE — Telephone Encounter (Signed)
Heidi Johnson left a message asking for a refill of dilaudid.  She said she will be out by tomorrow.  She is in [place] today and would like it sent to the [facility] on [address].

## 2020-04-23 NOTE — Telephone Encounter (Signed)
Heidi Johnson called and asked if Dr. Alvy Bimler wants an MRI of her left foot as well as her hip because a spot showed up on her bone scan.  Per Dr. Lurlean Leyden, she does not need an MRI of her foot.  Called MRI and let them know.

## 2020-04-23 NOTE — Telephone Encounter (Signed)
Called Heidi Johnson and advised her that the refill has been sent in.

## 2020-04-24 ENCOUNTER — Other Ambulatory Visit: Payer: Self-pay

## 2020-04-24 ENCOUNTER — Encounter: Payer: Self-pay | Admitting: Hematology and Oncology

## 2020-04-24 ENCOUNTER — Inpatient Hospital Stay (HOSPITAL_BASED_OUTPATIENT_CLINIC_OR_DEPARTMENT_OTHER): Payer: Medicare Other | Admitting: Hematology and Oncology

## 2020-04-24 ENCOUNTER — Encounter: Payer: Self-pay | Admitting: Oncology

## 2020-04-24 DIAGNOSIS — C786 Secondary malignant neoplasm of retroperitoneum and peritoneum: Secondary | ICD-10-CM

## 2020-04-24 DIAGNOSIS — C569 Malignant neoplasm of unspecified ovary: Secondary | ICD-10-CM

## 2020-04-24 DIAGNOSIS — Z5111 Encounter for antineoplastic chemotherapy: Secondary | ICD-10-CM | POA: Diagnosis not present

## 2020-04-24 DIAGNOSIS — R159 Full incontinence of feces: Secondary | ICD-10-CM

## 2020-04-24 DIAGNOSIS — G894 Chronic pain syndrome: Secondary | ICD-10-CM | POA: Diagnosis not present

## 2020-04-24 DIAGNOSIS — M25552 Pain in left hip: Secondary | ICD-10-CM

## 2020-04-24 MED ORDER — GABAPENTIN 300 MG PO CAPS: 900.0000 mg | ORAL_CAPSULE | Freq: Three times a day (TID) | ORAL | 4 refills | Status: DC

## 2020-04-24 NOTE — Progress Notes (Signed)
[place] OFFICE PROGRESS NOTE  Patient Care Team: Pcp, No as PCP - General  ASSESSMENT & PLAN:  Ovarian cancer ([place]) The MRI of the hip confirmed evidence of metastatic lesion The patient felt that she is symptomatic I will refer her to radiation oncologist for palliative radiation treatment Once she has completed radiation treatment, I recommend close observation with plan to repeat CT imaging again in January for further follow-up  Acute hip pain, left MRI of the hip confirmed lesion The patient is symptomatic I recommend consultation with radiation oncologist for evaluation and palliative radiation therapy She did not find addition of dexamethasone was helpful She is requesting the dose of gabapentin to be increased because in the past, she found that helpful I increase her gabapentin to 900 mg 3 times a day per patient request  Chronic pain syndrome We discussed chronic pain management We discussed narcotic refill policy I am hopeful that palliative radiation treatment can help manage her localized hip pain  Fecal incontinence She was not able to complete physical therapy and rehab because it seems to aggravate her left hip pain We will put a hold on her physical therapy and refer her back again after radiation treatment is completed   No orders of the defined types were placed in this encounter.   All questions were answered. The patient knows to call the clinic with any problems, questions or concerns. The total time spent in the appointment was 30 minutes encounter with patients including review of chart and various tests results, discussions about plan of care and coordination of care plan   [name], MD 04/24/2020 1:25 PM  INTERVAL HISTORY: Please see below for problem oriented charting. She returns to review results of her MRI imaging She returns with multiple family members She was not able to complete physical therapy recently because it  appears to aggravate her pain She have 2 types of pain.  She have severe hip pain and severe neuropathic pain From a last visit, I recommend addition of dexamethasone She did not like the side effects because it caused her to be flushed and it did not help her pain control  SUMMARY OF ONCOLOGIC HISTORY: Oncology History Overview Note  Negative genetics at [facility]   Ovarian cancer ([place])  04/25/2016 Imaging   CT confirms 12cm ovarian masses with extensive peritoneal metastases and malignant pleural effusions   05/05/2016 Surgery   s/p ex-lap posterior exenteration, diaphragm stripping, cholecystectomy, omentectomy, appendectomy and optimal tumor debulking on 05/05/16   06/24/2016 - 10/24/2016 Chemotherapy   Started Taxol/Carboplatin. 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  Numerous left predominant calcified pleural metastases and associated small left pleural effusion are unchanged.  2.  Unchanged (3.0 cm) (series 3, image 96) left breast mass    02/07/2019 Imaging   Outside Ct abdomen and pelvis 1. Overall stable small volume peritoneal tumor, as described above.  2. Unchanged infrarenal abdominal aortic aneurysm.    02/22/2019 Tumor Marker   Patient's tumor was tested for the following markers: CA-125 Results of the tumor marker  test revealed 164   03/18/2019 Tumor Marker   Patient's tumor was tested for the following markers: CA-125 Results of the tumor marker test revealed 162   04/08/2019 Tumor Marker   Patient's tumor was tested for the following markers:  CA-125 Results of the tumor marker test revealed 164   04/29/2019 Tumor Marker   Patient's tumor was tested for the following markers: CA-125 Results of the tumor marker test revealed 181   05/20/2019 Tumor Marker   Patient's tumor was tested for the following markers: CA-125 Results of the tumor marker test revealed 164   06/10/2019 Tumor Marker   Patient's tumor was tested for the following markers: CA-125 Results of the tumor marker test revealed 152   07/01/2019 Tumor Marker   Patient's tumor was tested for the following markers: CA-125 Results of the tumor marker test revealed 160   07/22/2019 Tumor Marker   Patient's tumor was tested for the following markers: CA-125 Results of the tumor marker test revealed 171   08/12/2019 Tumor Marker   Patient's tumor was tested for the following markers: CA-125 Results of the tumor marker test revealed 173   09/02/2019 Tumor Marker   Patient's tumor was tested for the following markers: CA-125 Results of the tumor marker test revealed 159   09/30/2019 Tumor Marker   Patient's tumor was tested for the following markers: CA-125 Results of the tumor marker test revealed 168   11/08/2019 Imaging   Outside CT imaging 1. Study was performed at outside institution on 11/08/2019 and presented for review on 11/14/2019.  2. Stable calcified and noncalcified left-sided nodular and plaque-like pleural abnormalities with small chronic left pleural effusion compatible with stable treated pleural metastasis. No new or enlarging lesions.  3. No enlarging thoracic lymph nodes.  4. Redemonstration of left breast mass. Correlation with mammography is suggested if not already performed   12/02/2019 Tumor Marker   Patient's tumor was tested for the following markers: CA-125 Results of the tumor marker test revealed 186   12/18/2019 Cancer Staging   Staging form: Ovary, Fallopian Tube, and Primary Peritoneal Carcinoma, AJCC 8th Edition - Clinical stage from  12/18/2019: FIGO Stage IV (rcT3c, cN0, cM1) - Signed by [name], MD on 04/24/2020   01/09/2020 Tumor Marker   Patient's tumor was tested for the following markers: CA-125 Results of the tumor marker test revealed 184   01/10/2020 Imaging   1. Treated left pleural and peritoneal metastatic disease. Areas of residual soft tissue prominence in the right anatomic pelvis are indeterminate. Comparison with prior exams would be helpful. 2. Hepatomegaly. Low-attenuation lesions in the liver are too small to characterize. Comparison with prior exams would be helpful. 3. Small left fibrothorax. 4. Infrarenal Aortic aneurysm NOS (ICD10-I71.9). Recommend followup by ultrasound in 2 years.  5. Aortic atherosclerosis (ICD10-I70.0). Coronary artery calcification.   03/09/2020 Tumor Marker   Patient's tumor was tested for the following markers: CA-125 Results of the tumor marker test revealed 214   04/01/2020 Tumor Marker   Patient's tumor was tested for the following markers: CA-125 Results of the tumor marker test revealed 245.   04/03/2020 Imaging   1. No acute findings in the abdomen or pelvis. Specifically, no findings to explain the patient's history of left upper quadrant pain with nausea and vomiting. 2. Stable appearance of calcified omental and mesenteric nodules consistent with treated metastatic disease. Soft tissue fullness seen in the right adnexal region on the prior exam is similar today. 3. Stable 1.6 x 1.2  cm low-density lesion lateral segment left liver. 4. 3.5 cm infrarenal abdominal aortic aneurysm. Recommend follow-up ultrasound every 2 years. This recommendation follows ACR consensus guidelines: White Paper of the ACR Incidental Findings Committee II on Vascular Findings. J Am Coll Radiol 2013; 09:811-914. 5. Aortic Atherosclerosis (ICD10-I70.0).   04/15/2020 Imaging   Focus of abnormal uptake is seen involving the greater trochanter of the proximal left femur which corresponds to  sclerotic density seen on prior CT scan and is concerning for metastatic disease.   04/23/2020 Imaging   1. No hip fracture, dislocation or avascular necrosis. 2. A 18 mm bone lesion in the peripheral superior aspect of the left greater trochanter with enhancement on postcontrast imaging. The overall appearance is most concerning for metastatic disease in the setting of known malignancy. 3. 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 4. Degenerative disease with disc height loss at L4-5 and L5-S1 with bilateral facet arthropathy.     REVIEW OF SYSTEMS:   Constitutional: Denies fevers, chills or abnormal weight loss Eyes: Denies blurriness of vision Ears, nose, mouth, throat, and face: Denies mucositis or sore throat Respiratory: Denies cough, dyspnea or wheezes Cardiovascular: Denies palpitation, chest discomfort or lower extremity swelling Gastrointestinal:  Denies nausea, heartburn or change in bowel habits Skin: Denies abnormal skin rashes Lymphatics: Denies new lymphadenopathy or easy bruising Neurological:Denies numbness, tingling or new weaknesses Behavioral/Psych: Mood is stable, no new changes  All other systems were reviewed with the patient and are negative.  I have reviewed the past medical history, past surgical history, social history and family history with the patient and they are unchanged from previous note.  ALLERGIES:  is allergic to latex and sulfamethoxazole-trimethoprim.  MEDICATIONS:  Current Outpatient Medications  Medication Sig Dispense Refill  . albuterol (VENTOLIN HFA) 108 (90 Base) MCG/ACT inhaler Inhale 2 puffs into the lungs as needed. 2 puffs into lungs every 6 hours as needed for wheezing or SOB    . aspirin 325 MG EC tablet Take 325 mg by mouth daily.    . clonazePAM (KLONOPIN) 1 MG tablet Take 1 mg by mouth 3 (three) times daily as needed for anxiety.    . cyclobenzaprine (FLEXERIL) 5 MG tablet TAKE 1 TABLET (5 MG TOTAL) BY MOUTH 3 (THREE) TIMES DAILY  AS NEEDED. 60 tablet 1  . DILAUDID 2 MG tablet Take 1 tablet (2 mg total) by mouth 4 (four) times daily as needed for severe pain. 90 tablet 0  . docusate sodium (COLACE) 100 MG capsule Take 100 mg by mouth 2 (two) times daily.    Marland Kitchen gabapentin (NEURONTIN) 300 MG capsule Take 3 capsules (900 mg total) by mouth 3 (three) times daily. 270 capsule 4  . hydrochlorothiazide (HYDRODIURIL) 25 MG tablet Take 25 mg by mouth daily.    Marland Kitchen lidocaine-prilocaine (EMLA) cream Apply 1 application topically as needed (port).     Marland Kitchen lisinopril-hydrochlorothiazide (ZESTORETIC) 20-25 MG tablet Take 1 tablet by mouth 2 (two) times daily.    Marland Kitchen loperamide (IMODIUM A-D) 2 MG tablet Take 2 mg by mouth 4 (four) times daily as needed for diarrhea or loose stools.    Marland Kitchen LORazepam (ATIVAN) 2 MG tablet Take 1 pill 1 hour before the scan and another dose 15 to 20 mins before MRI and may take additional doses afterwards if needed 3 tablet 0  . MARINOL 5 MG capsule Take 5 mg by mouth 3 (three) times daily.    . naloxone (NARCAN) 2 MG/2ML injection Place 2 mg into the  nose as needed.     Marland Kitchen OLANZapine (ZYPREXA) 10 MG tablet Take 10 mg by mouth at bedtime.    . ondansetron (ZOFRAN) 8 MG tablet Take 8 mg by mouth every 8 (eight) hours as needed for nausea.     . rosuvastatin (CRESTOR) 40 MG tablet Take 40 mg by mouth daily.    . simethicone (MYLICON) 80 MG chewable tablet Chew 80 mg by mouth every 6 (six) hours as needed for flatulence.      No current facility-administered medications for this visit.   Facility-Administered Medications Ordered in Other Visits  Medication Dose Route Frequency Provider Last Rate Last Admin  . heparin lock flush 100 unit/mL  500 Units Intracatheter Once [name], MD        PHYSICAL EXAMINATION: ECOG PERFORMANCE STATUS: 1 - Symptomatic but completely ambulatory  Vitals:   04/24/20 1310  BP: (!) 146/87  Pulse: 81  Resp: 18  Temp: 97.6 F (36.4 C)  SpO2: 99%   Filed Weights   04/24/20 1310   Weight: 191 lb 3.2 oz (86.7 kg)    GENERAL:alert, no distress and comfortable NEURO: alert & oriented x 3 with fluent speech, no focal motor/sensory deficits  LABORATORY DATA:  I have reviewed the data as listed    Component Value Date/Time   NA 138 04/03/2020 0145   K 3.8 04/03/2020 0145   CL 103 04/03/2020 0145   CO2 25 04/03/2020 0145   GLUCOSE 122 (H) 04/03/2020 0145   BUN 21 (H) 04/03/2020 0145   CREATININE 0.73 04/03/2020 0145   CREATININE 0.71 01/09/2020 1404   CALCIUM 9.2 04/03/2020 0145   PROT 7.2 04/03/2020 0145   ALBUMIN 4.4 04/03/2020 0145   AST 24 04/03/2020 0145   AST 27 01/09/2020 1404   ALT 17 04/03/2020 0145   ALT 27 01/09/2020 1404   ALKPHOS 89 04/03/2020 0145   BILITOT 1.0 04/03/2020 0145   BILITOT 0.5 01/09/2020 1404   GFRNONAA >60 04/03/2020 0145   GFRNONAA >60 01/09/2020 1404   GFRAA >60 03/09/2020 1005   GFRAA >60 01/09/2020 1404    No results found for: SPEP, UPEP  Lab Results  Component Value Date   WBC 9.8 04/03/2020   NEUTROABS 6.7 04/03/2020   HGB 13.7 04/03/2020   HCT 43.1 04/03/2020   MCV 90.0 04/03/2020   PLT 204 04/03/2020      Chemistry      Component Value Date/Time   NA 138 04/03/2020 0145   K 3.8 04/03/2020 0145   CL 103 04/03/2020 0145   CO2 25 04/03/2020 0145   BUN 21 (H) 04/03/2020 0145   CREATININE 0.73 04/03/2020 0145   CREATININE 0.71 01/09/2020 1404      Component Value Date/Time   CALCIUM 9.2 04/03/2020 0145   ALKPHOS 89 04/03/2020 0145   AST 24 04/03/2020 0145   AST 27 01/09/2020 1404   ALT 17 04/03/2020 0145   ALT 27 01/09/2020 1404   BILITOT 1.0 04/03/2020 0145   BILITOT 0.5 01/09/2020 1404       RADIOGRAPHIC STUDIES: I have reviewed multiple imaging study with the patient and family I have personally reviewed the radiological images as listed and agreed with the findings in the report. CT Angio Chest PE W and/or Wo Contrast  Addendum Date: 04/03/2020   ADDENDUM REPORT: 04/03/2020 05:47  ADDENDUM: 3.1 x 1.8 cm nodular soft tissue lesion in the lateral left breast is stable in the interval. Correlation with mammographic history recommended. Electronically Signed  By: Misty Stanley M.D.   On: 04/03/2020 05:47   Result Date: 04/03/2020 CLINICAL DATA:  Left flank pain. PE suspected. High probability. History of ovarian cancer. EXAM: CT ANGIOGRAPHY CHEST WITH CONTRAST TECHNIQUE: Multidetector CT imaging of the chest was performed using the standard protocol during bolus administration of intravenous contrast. Multiplanar CT image reconstructions and MIPs were obtained to evaluate the vascular anatomy. CONTRAST:  130mL OMNIPAQUE IOHEXOL 350 MG/ML SOLN COMPARISON:  Standard CT chest 01/09/2020 FINDINGS: Cardiovascular: The heart size is normal. No substantial pericardial effusion. Atherosclerotic calcification is noted in the wall of the thoracic aorta. No filling defects within the opacified pulmonary arteries to suggest the presence of an acute pulmonary embolus. Evaluation of distal segmental and subsegmental pulmonary arteries in the lower lobes is prominently degraded by motion artifact. Left Port-A-Cath tip is positioned in the right atrium. Mediastinum/Nodes: Calcified mediastinal lymph nodes are stable in the interval. 9 mm calcified anterior juxta cardiac node is stable. There is no hilar lymphadenopathy. The esophagus has normal imaging features. There is no axillary lymphadenopathy. Lungs/Pleura: Calcified nodular pleural disease in the left hemithorax is stable with plaque-like appearance in the medial left upper lobe. Chronic pleural fluid/thickening in the posterior left costophrenic sulcus is not substantially changed since prior. No new suspicious pulmonary nodule or mass. Upper Abdomen: See the report for abdomen pelvis CT performed at the same time. Musculoskeletal: No worrisome lytic or sclerotic osseous abnormality. Review of the MIP images confirms the above findings. IMPRESSION:  1. No CT evidence for acute pulmonary embolus. Evaluation of distal segmental and subsegmental pulmonary arteries in the lower lobes is prominently degraded by motion artifact but assessment may not be reliable. 2. Stable appearance of calcified nodular pleural disease in the left hemithorax consistent with treated metastatic disease in this patient with a history of ovarian cancer. 3. Stable chronic pleural fluid/thickening in the posterior left costophrenic sulcus. 4. Aortic Atherosclerosis (ICD10-I70.0). Electronically Signed: By: Misty Stanley M.D. On: 04/03/2020 05:23   MR HIP LEFT W WO CONTRAST  Result Date: 04/24/2020 CLINICAL DATA:  Acute left hip pain. EXAM: MRI OF THE LEFT HIP WITHOUT AND WITH CONTRAST TECHNIQUE: Multiplanar, multisequence MR imaging was performed both before and after administration of intravenous contrast. CONTRAST:  75mL GADAVIST GADOBUTROL 1 MMOL/ML IV SOLN COMPARISON:  None. FINDINGS: Bones: No hip fracture, dislocation or avascular necrosis. 18 mm T1 hypointense and T2 hyperintense bone lesion in the peripheral superior aspect of the left greater trochanter with enhancement on postcontrast imaging. 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 No SI joint widening or erosive changes. Degenerative disease with disc height loss at L4-5 and L5-S1 with bilateral facet arthropathy. Articular cartilage and labrum Articular cartilage:  No chondral defect. Labrum: Grossly intact, but evaluation is limited by lack of intraarticular fluid. Joint or bursal effusion Joint effusion:  No hip joint effusion.  No SI joint effusion. Bursae:  No bursa formation. Muscles and tendons Flexors: Normal. Extensors: Normal. Abductors: Normal. Adductors: Normal. Gluteals: Normal. Hamstrings: Normal. Other findings No pelvic free fluid. No fluid collection or hematoma. No inguinal lymphadenopathy. No inguinal hernia. Diverticulosis of the [place] diverticulitis. IMPRESSION: 1. No  hip fracture, dislocation or avascular necrosis. 2. A 18 mm bone lesion in the peripheral superior aspect of the left greater trochanter with enhancement on postcontrast imaging. The overall appearance is most concerning for metastatic disease in the setting of known malignancy. 3. 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 4. Degenerative disease  with disc height loss at L4-5 and L5-S1 with bilateral facet arthropathy. Electronically Signed   By: Kathreen Devoid   On: 04/24/2020 09:03   NM Bone Scan Whole Body  Result Date: 04/15/2020 CLINICAL DATA:  Back pain.  History of ovarian cancer. EXAM: NUCLEAR MEDICINE WHOLE BODY BONE SCAN TECHNIQUE: Whole body anterior and posterior images were obtained approximately 3 hours after intravenous injection of radiopharmaceutical. RADIOPHARMACEUTICALS:  20.1 mCi Technetium-35m MDP IV COMPARISON:  April 03, 2020. FINDINGS: Focus of uptake is seen projected over the left upper rib cage that most likely represents overlying Port-A-Cath reservoir. There is a focus of abnormal uptake involving the greater trochanter of the proximal left femur which corresponds to sclerotic density seen on prior CT scan and is concerning for possible metastatic disease. Increased uptake is seen involving both feet which most likely represents degenerative change. IMPRESSION: Focus of abnormal uptake is seen involving the greater trochanter of the proximal left femur which corresponds to sclerotic density seen on prior CT scan and is concerning for metastatic disease. Electronically Signed   By: Marijo Conception M.D.   On: 04/15/2020 08:31   CT ABDOMEN PELVIS W CONTRAST  Result Date: 04/03/2020 CLINICAL DATA:  Left upper quadrant pain with nausea vomiting. History of metastatic ovarian cancer. EXAM: CT ABDOMEN AND PELVIS WITH CONTRAST TECHNIQUE: Multidetector CT imaging of the abdomen and pelvis was performed using the standard protocol following bolus administration of intravenous contrast.  CONTRAST:  130mL OMNIPAQUE IOHEXOL 350 MG/ML SOLN COMPARISON:  01/09/2020 FINDINGS: Lower chest: See report for insert CT angio chest exam performed at the same time. Hepatobiliary: Similar appearance 1.6 x 1.2 cm low-density lesion lateral segment left liver. Calcified lesion along the falciform ligament is unchanged. Gallbladder surgically absent. No intrahepatic or extrahepatic biliary dilation. Pancreas: No focal mass lesion. No dilatation of the main duct. No intraparenchymal cyst. No peripancreatic edema. Spleen: No splenomegaly. No focal mass lesion. Adrenals/Urinary Tract: No adrenal nodule or mass. Kidneys unremarkable. No evidence for hydroureter. The urinary bladder appears normal for the degree of distention. Stomach/Bowel: Stomach is unremarkable. No gastric wall thickening. No evidence of outlet obstruction. Duodenum is normally positioned as is the ligament of Treitz. No small bowel wall thickening. No small bowel dilatation. The terminal ileum is normal. The appendix is not visualized, but there is no edema or inflammation in the region of the cecum. No gross colonic mass. No colonic wall thickening. Vascular/Lymphatic: There is abdominal aortic atherosclerosis. Infrarenal abdominal aorta measures 3.5 cm diameter. The portal vein, superior mesenteric vein and splenic vein are patent. There is no gastrohepatic or hepatoduodenal ligament lymphadenopathy. No retroperitoneal or mesenteric lymphadenopathy. No pelvic sidewall lymphadenopathy. Reproductive: Uterus surgically absent. Soft tissue fullness in the right adnexal space (66/2) is similar to prior. Other: No intraperitoneal free fluid. Calcified omental nodules again noted, stable in the interval. Similar stable calcified perigastric and mesenteric nodules. Musculoskeletal: No worrisome lytic or sclerotic osseous abnormality. IMPRESSION: 1. No acute findings in the abdomen or pelvis. Specifically, no findings to explain the patient's history of  left upper quadrant pain with nausea and vomiting. 2. Stable appearance of calcified omental and mesenteric nodules consistent with treated metastatic disease. Soft tissue fullness seen in the right adnexal region on the prior exam is similar today. 3. Stable 1.6 x 1.2 cm low-density lesion lateral segment left liver. 4. 3.5 cm infrarenal abdominal aortic aneurysm. Recommend follow-up ultrasound every 2 years. This recommendation follows ACR consensus guidelines: White Paper of the ACR Incidental Findings  Committee II on Vascular Findings. J Am Coll Radiol 2013; 52:080-223. 5. Aortic Atherosclerosis (ICD10-I70.0). Electronically Signed   By: Misty Stanley M.D.   On: 04/03/2020 05:46

## 2020-04-24 NOTE — Assessment & Plan Note (Signed)
We discussed chronic pain management We discussed narcotic refill policy I am hopeful that palliative radiation treatment can help manage her localized hip pain

## 2020-04-24 NOTE — Assessment & Plan Note (Signed)
The MRI of the hip confirmed evidence of metastatic lesion The patient felt that she is symptomatic I will refer her to radiation oncologist for palliative radiation treatment Once she has completed radiation treatment, I recommend close observation with plan to repeat CT imaging again in January for further follow-up

## 2020-04-24 NOTE — Assessment & Plan Note (Signed)
She was not able to complete physical therapy and rehab because it seems to aggravate her left hip pain We will put a hold on her physical therapy and refer her back again after radiation treatment is completed

## 2020-04-24 NOTE — Assessment & Plan Note (Addendum)
MRI of the hip confirmed lesion The patient is symptomatic I recommend consultation with radiation oncologist for evaluation and palliative radiation therapy She did not find addition of dexamethasone was helpful She is requesting the dose of gabapentin to be increased because in the past, she found that helpful I increase her gabapentin to 900 mg 3 times a day per patient request

## 2020-04-27 ENCOUNTER — Telehealth: Payer: Self-pay | Admitting: Oncology

## 2020-04-27 ENCOUNTER — Other Ambulatory Visit: Payer: Self-pay | Admitting: Hematology and Oncology

## 2020-04-27 ENCOUNTER — Encounter: Payer: Medicare Other | Admitting: Physical Therapy

## 2020-04-27 DIAGNOSIS — G893 Neoplasm related pain (acute) (chronic): Secondary | ICD-10-CM

## 2020-04-27 MED ORDER — MORPHINE SULFATE ER 15 MG PO TBCR
15.0000 mg | EXTENDED_RELEASE_TABLET | Freq: Three times a day (TID) | ORAL | 0 refills | Status: DC
Start: 2020-04-27 — End: 2020-05-04

## 2020-04-27 NOTE — Telephone Encounter (Signed)
done

## 2020-04-27 NOTE — Telephone Encounter (Signed)
Heidi Johnson called and said she had "excruciating" pain in her left hip/leg that she is describing as burning and aching and constant.  She is also having some trouble walking due to the pain and is reporting that she had trouble sleeping.  She is taking dilaudid and gabapentin as directed and it is not helping.  She is also using a heating pad.  She is asking if Dr. Alvy Bimler has any recommendations on what to do for the pain.

## 2020-04-27 NOTE — Telephone Encounter (Signed)
I can add long acting Morphine to take TID scheduled, warn her about risk of constipation I need to know  1) which pharmacy 2) return appt or e-visit on Wednesday afternoon at 1 pm to discuss pain management  Let me know

## 2020-04-27 NOTE — Telephone Encounter (Signed)
Heidi Johnson called back and wanted to change the appointment on Wednesday to an in person visit.  Appointment has been changed to in person.

## 2020-04-27 NOTE — Telephone Encounter (Signed)
Heidi Johnson with message below from Dr. Alvy Bimler.  She would like to try the morphine and [facility] [address] [facility] [place].  She is aware of the risk for constipation and is taking Miralax.  She would like a virtual/phone visit for Wednesday and is aware it will be at 1:00.

## 2020-04-27 NOTE — Telephone Encounter (Signed)
Heidi Johnson called and said the pain is still excruciating.  She took one morphine tablet at 10 am and has also taken 1 tablet of dilaudid without any relief.  She is wondering if she can change her appointment with Dr. Alvy Bimler to tomorrow.  Rescheduled appointment to 1:00 tomorrow.  She verbalized understanding.

## 2020-04-28 ENCOUNTER — Inpatient Hospital Stay (HOSPITAL_BASED_OUTPATIENT_CLINIC_OR_DEPARTMENT_OTHER): Payer: Medicare Other | Admitting: Hematology and Oncology

## 2020-04-28 ENCOUNTER — Other Ambulatory Visit: Payer: Self-pay

## 2020-04-28 ENCOUNTER — Encounter: Payer: Self-pay | Admitting: Hematology and Oncology

## 2020-04-28 DIAGNOSIS — I10 Essential (primary) hypertension: Secondary | ICD-10-CM | POA: Diagnosis not present

## 2020-04-28 DIAGNOSIS — G894 Chronic pain syndrome: Secondary | ICD-10-CM

## 2020-04-28 DIAGNOSIS — C569 Malignant neoplasm of unspecified ovary: Secondary | ICD-10-CM

## 2020-04-28 DIAGNOSIS — Z5111 Encounter for antineoplastic chemotherapy: Secondary | ICD-10-CM | POA: Diagnosis not present

## 2020-04-28 NOTE — Progress Notes (Signed)
[facility] OFFICE PROGRESS NOTE  Patient Care Team: Pcp, No as PCP - General  ASSESSMENT & PLAN:  Ovarian cancer [facility]) Her consultation with radiation oncologist this next week Continue supportive care for now  Chronic pain syndrome The patient is not opiate nave I anticipate she would need more aggressive pain control in the near future For now, I recommend she continue MS Contin as prescribed along with Dilaudid as needed I also instructed her to resume taking dexamethasone in the morning and to add ibuprofen in the evening I will call her in a few days for assessment of pain control If dexamethasone is helpful, we will refill her dexamethasone prescription Next week, she would be eligible for medication refill If her pain is still suboptimally controlled, I will increase MS Contin to 30 mg 3 times a day and when she is eligible for medication refill for Dilaudid, I plan to increase from 2 mg tablet to 4 mg tablet in the future We discussed narcotic refill policy The patient is reminded to refrain from taking medication outside the prescribed parameters I warned her about risk of constipation  Essential hypertension Her blood pressure is very high today, likely exacerbated by uncontrolled pain We will continue to monitor this carefully   No orders of the defined types were placed in this encounter.   All questions were answered. The patient knows to call the clinic with any problems, questions or concerns. The total time spent in the appointment was 25 minutes encounter with patients including review of chart and various tests results, discussions about plan of care and coordination of care plan   [name], MD 04/28/2020 1:45 PM  INTERVAL HISTORY: Please see below for problem oriented charting. She is seen urgently today at patient's request because of uncontrolled pain When she called on Monday, I added MS Contin to be taken 3 times a day When we  discontinue her dexamethasone last week, she felt that her pain is excruciating in nature, radiating down from the hip to her knees She was tempted to take her prescribed Dilaudid more frequently because of uncontrolled pain I have increased her gabapentin dose recently and it was not helping She denies worsening nausea or constipation when I started her on MS Contin  SUMMARY OF ONCOLOGIC HISTORY: Oncology History Overview Note  Negative genetics at [facility]   Ovarian cancer ([place])  04/25/2016 Imaging   CT confirms 12cm ovarian masses with extensive peritoneal metastases and malignant pleural effusions   05/05/2016 Surgery   s/p ex-lap posterior exenteration, diaphragm stripping, cholecystectomy, omentectomy, appendectomy and optimal tumor debulking on 05/05/16   06/24/2016 - 10/24/2016 Chemotherapy   Started Taxol/Carboplatin. 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  Numerous left predominant calcified pleural metastases and associated small left pleural effusion are unchanged.  2.  Unchanged (3.0 cm) (series 3, image 96) left breast mass    02/07/2019 Imaging   Outside Ct abdomen and pelvis 1. Overall stable small volume peritoneal tumor, as described above.  2. Unchanged infrarenal abdominal aortic aneurysm.    02/22/2019  Tumor Marker   Patient's tumor was tested for the following markers: CA-125 Results of the tumor marker test revealed 164   03/18/2019 Tumor Marker   Patient's tumor was tested for the following markers: CA-125 Results of the tumor marker test revealed 162    04/08/2019 Tumor Marker   Patient's tumor was tested for the following markers: CA-125 Results of the tumor marker test revealed 164   04/29/2019 Tumor Marker   Patient's tumor was tested for the following markers: CA-125 Results of the tumor marker test revealed 181   05/20/2019 Tumor Marker   Patient's tumor was tested for the following markers: CA-125 Results of the tumor marker test revealed 164   06/10/2019 Tumor Marker   Patient's tumor was tested for the following markers: CA-125 Results of the tumor marker test revealed 152   07/01/2019 Tumor Marker   Patient's tumor was tested for the following markers: CA-125 Results of the tumor marker test revealed 160   07/22/2019 Tumor Marker   Patient's tumor was tested for the following markers: CA-125 Results of the tumor marker test revealed 171   08/12/2019 Tumor Marker   Patient's tumor was tested for the following markers: CA-125 Results of the tumor marker test revealed 173   09/02/2019 Tumor Marker   Patient's tumor was tested for the following markers: CA-125 Results of the tumor marker test revealed 159   09/30/2019 Tumor Marker   Patient's tumor was tested for the following markers: CA-125 Results of the tumor marker test revealed 168   11/08/2019 Imaging   Outside CT imaging 1. Study was performed at outside institution on 11/08/2019 and presented for review on 11/14/2019.  2. Stable calcified and noncalcified left-sided nodular and plaque-like pleural abnormalities with small chronic left pleural effusion compatible with stable treated pleural metastasis. No new or enlarging lesions.  3. No enlarging thoracic lymph nodes.  4. Redemonstration of left breast mass. Correlation with mammography is suggested if not already performed   12/02/2019 Tumor Marker   Patient's tumor was tested for the following markers: CA-125 Results of the tumor marker test revealed 186   12/18/2019 Cancer Staging   Staging form: Ovary,  Fallopian Tube, and Primary Peritoneal Carcinoma, AJCC 8th Edition - Clinical stage from 12/18/2019: FIGO Stage IV (rcT3c, cN0, cM1) - Signed by [name], MD on 04/24/2020   01/09/2020 Tumor Marker   Patient's tumor was tested for the following markers: CA-125 Results of the tumor marker test revealed 184   01/10/2020 Imaging   1. Treated left pleural and peritoneal metastatic disease. Areas of residual soft tissue prominence in the right anatomic pelvis are indeterminate. Comparison with prior exams would be helpful. 2. Hepatomegaly. Low-attenuation lesions in the liver are too small to characterize. Comparison with prior exams would be helpful. 3. Small left fibrothorax. 4. Infrarenal Aortic aneurysm NOS (ICD10-I71.9). Recommend followup by ultrasound in 2 years.  5. Aortic atherosclerosis (ICD10-I70.0). Coronary artery calcification.   03/09/2020 Tumor Marker   Patient's tumor was tested for the following markers: CA-125 Results of the tumor marker test revealed 214   04/01/2020 Tumor Marker   Patient's tumor was tested for the following markers: CA-125 Results of the tumor marker test revealed 245.   04/03/2020 Imaging   1. No acute findings in the abdomen or pelvis. Specifically, no findings to explain the patient's history of left upper quadrant pain with nausea and vomiting. 2. Stable appearance of calcified omental and mesenteric nodules consistent with treated metastatic disease. Soft tissue fullness  seen in the right adnexal region on the prior exam is similar today. 3. Stable 1.6 x 1.2 cm low-density lesion lateral segment left liver. 4. 3.5 cm infrarenal abdominal aortic aneurysm. Recommend follow-up ultrasound every 2 years. This recommendation follows ACR consensus guidelines: White Paper of the ACR Incidental Findings Committee II on Vascular Findings. J Am Coll Radiol 2013; 45:809-983. 5. Aortic Atherosclerosis (ICD10-I70.0).   04/15/2020 Imaging   Focus of abnormal uptake  is seen involving the greater trochanter of the proximal left femur which corresponds to sclerotic density seen on prior CT scan and is concerning for metastatic disease.   04/23/2020 Imaging   1. No hip fracture, dislocation or avascular necrosis. 2. A 18 mm bone lesion in the peripheral superior aspect of the left greater trochanter with enhancement on postcontrast imaging. The overall appearance is most concerning for metastatic disease in the setting of known malignancy. 3. 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 4. Degenerative disease with disc height loss at L4-5 and L5-S1 with bilateral facet arthropathy.     REVIEW OF SYSTEMS:   Constitutional: Denies fevers, chills or abnormal weight loss Eyes: Denies blurriness of vision Ears, nose, mouth, throat, and face: Denies mucositis or sore throat Respiratory: Denies cough, dyspnea or wheezes Cardiovascular: Denies palpitation, chest discomfort or lower extremity swelling Gastrointestinal:  Denies nausea, heartburn or change in bowel habits Skin: Denies abnormal skin rashes Lymphatics: Denies new lymphadenopathy or easy bruising Neurological:Denies numbness, tingling or new weaknesses Behavioral/Psych: Mood is stable, no new changes  All other systems were reviewed with the patient and are negative.  I have reviewed the past medical history, past surgical history, social history and family history with the patient and they are unchanged from previous note.  ALLERGIES:  is allergic to latex and sulfamethoxazole-trimethoprim.  MEDICATIONS:  Current Outpatient Medications  Medication Sig Dispense Refill  . dexamethasone (DECADRON) 4 MG tablet Take 4 mg by mouth daily.    Marland Kitchen ibuprofen (ADVIL) 600 MG tablet Take 600 mg by mouth every 6 (six) hours as needed.    Marland Kitchen albuterol (VENTOLIN HFA) 108 (90 Base) MCG/ACT inhaler Inhale 2 puffs into the lungs as needed. 2 puffs into lungs every 6 hours as needed for wheezing or SOB    . aspirin 325  MG EC tablet Take 325 mg by mouth daily.    . clonazePAM (KLONOPIN) 1 MG tablet Take 1 mg by mouth 3 (three) times daily as needed for anxiety.    . cyclobenzaprine (FLEXERIL) 5 MG tablet TAKE 1 TABLET (5 MG TOTAL) BY MOUTH 3 (THREE) TIMES DAILY AS NEEDED. 60 tablet 1  . DILAUDID 2 MG tablet Take 1 tablet (2 mg total) by mouth 4 (four) times daily as needed for severe pain. 90 tablet 0  . docusate sodium (COLACE) 100 MG capsule Take 100 mg by mouth 2 (two) times daily.    Marland Kitchen gabapentin (NEURONTIN) 300 MG capsule Take 3 capsules (900 mg total) by mouth 3 (three) times daily. 270 capsule 4  . hydrochlorothiazide (HYDRODIURIL) 25 MG tablet Take 25 mg by mouth daily.    Marland Kitchen lidocaine-prilocaine (EMLA) cream Apply 1 application topically as needed (port).     Marland Kitchen lisinopril-hydrochlorothiazide (ZESTORETIC) 20-25 MG tablet Take 1 tablet by mouth 2 (two) times daily.    Marland Kitchen loperamide (IMODIUM A-D) 2 MG tablet Take 2 mg by mouth 4 (four) times daily as needed for diarrhea or loose stools.    Marland Kitchen LORazepam (ATIVAN) 2 MG tablet Take 1 pill 1  hour before the scan and another dose 15 to 20 mins before MRI and may take additional doses afterwards if needed 3 tablet 0  . MARINOL 5 MG capsule Take 5 mg by mouth 3 (three) times daily.    Marland Kitchen morphine (MS CONTIN) 15 MG 12 hr tablet Take 1 tablet (15 mg total) by mouth every 8 (eight) hours for 7 days. 21 tablet 0  . naloxone (NARCAN) 2 MG/2ML injection Place 2 mg into the nose as needed.     Marland Kitchen OLANZapine (ZYPREXA) 10 MG tablet Take 10 mg by mouth at bedtime.    . ondansetron (ZOFRAN) 8 MG tablet Take 8 mg by mouth every 8 (eight) hours as needed for nausea.     . rosuvastatin (CRESTOR) 40 MG tablet Take 40 mg by mouth daily.    . simethicone (MYLICON) 80 MG chewable tablet Chew 80 mg by mouth every 6 (six) hours as needed for flatulence.      No current facility-administered medications for this visit.   Facility-Administered Medications Ordered in Other Visits   Medication Dose Route Frequency Provider Last Rate Last Admin  . heparin lock flush 100 unit/mL  500 Units Intracatheter Once [name], MD        PHYSICAL EXAMINATION: ECOG PERFORMANCE STATUS: 1 - Symptomatic but completely ambulatory  Vitals:   04/28/20 1242  BP: (!) 143/102  Pulse: 84  Resp: 20  Temp: (!) 97.4 F (36.3 C)  SpO2: 100%   Filed Weights   04/28/20 1242  Weight: 195 lb 9.6 oz (88.7 kg)    GENERAL:alert, no distress and comfortable NEURO: alert & oriented x 3 with fluent speech, no focal motor/sensory deficits  LABORATORY DATA:  I have reviewed the data as listed    Component Value Date/Time   NA 138 04/03/2020 0145   K 3.8 04/03/2020 0145   CL 103 04/03/2020 0145   CO2 25 04/03/2020 0145   GLUCOSE 122 (H) 04/03/2020 0145   BUN 21 (H) 04/03/2020 0145   CREATININE 0.73 04/03/2020 0145   CREATININE 0.71 01/09/2020 1404   CALCIUM 9.2 04/03/2020 0145   PROT 7.2 04/03/2020 0145   ALBUMIN 4.4 04/03/2020 0145   AST 24 04/03/2020 0145   AST 27 01/09/2020 1404   ALT 17 04/03/2020 0145   ALT 27 01/09/2020 1404   ALKPHOS 89 04/03/2020 0145   BILITOT 1.0 04/03/2020 0145   BILITOT 0.5 01/09/2020 1404   GFRNONAA >60 04/03/2020 0145   GFRNONAA >60 01/09/2020 1404   GFRAA >60 03/09/2020 1005   GFRAA >60 01/09/2020 1404    No results found for: SPEP, UPEP  Lab Results  Component Value Date   WBC 9.8 04/03/2020   NEUTROABS 6.7 04/03/2020   HGB 13.7 04/03/2020   HCT 43.1 04/03/2020   MCV 90.0 04/03/2020   PLT 204 04/03/2020      Chemistry      Component Value Date/Time   NA 138 04/03/2020 0145   K 3.8 04/03/2020 0145   CL 103 04/03/2020 0145   CO2 25 04/03/2020 0145   BUN 21 (H) 04/03/2020 0145   CREATININE 0.73 04/03/2020 0145   CREATININE 0.71 01/09/2020 1404      Component Value Date/Time   CALCIUM 9.2 04/03/2020 0145   ALKPHOS 89 04/03/2020 0145   AST 24 04/03/2020 0145   AST 27 01/09/2020 1404   ALT 17 04/03/2020 0145   ALT 27  01/09/2020 1404   BILITOT 1.0 04/03/2020 0145   BILITOT 0.5 01/09/2020 1404

## 2020-04-28 NOTE — Assessment & Plan Note (Signed)
Her consultation with radiation oncologist this next week Continue supportive care for now

## 2020-04-28 NOTE — Assessment & Plan Note (Signed)
The patient is not opiate nave I anticipate she would need more aggressive pain control in the near future For now, I recommend she continue MS Contin as prescribed along with Dilaudid as needed I also instructed her to resume taking dexamethasone in the morning and to add ibuprofen in the evening I will call her in a few days for assessment of pain control If dexamethasone is helpful, we will refill her dexamethasone prescription Next week, she would be eligible for medication refill If her pain is still suboptimally controlled, I will increase MS Contin to 30 mg 3 times a day and when she is eligible for medication refill for Dilaudid, I plan to increase from 2 mg tablet to 4 mg tablet in the future We discussed narcotic refill policy The patient is reminded to refrain from taking medication outside the prescribed parameters I warned her about risk of constipation

## 2020-04-28 NOTE — Assessment & Plan Note (Signed)
Her blood pressure is very high today, likely exacerbated by uncontrolled pain We will continue to monitor this carefully

## 2020-04-29 ENCOUNTER — Ambulatory Visit: Payer: Medicare Other | Admitting: Hematology and Oncology

## 2020-05-01 ENCOUNTER — Telehealth: Payer: Self-pay | Admitting: Oncology

## 2020-05-01 NOTE — Telephone Encounter (Signed)
Ok I will send in all prescriptions refills first thing Monday morning

## 2020-05-01 NOTE — Telephone Encounter (Signed)
Heidi Johnson and she said her pain is a little better with the addition of dexamethasone.  She has 3 tablets left and will need a [facility] on [facility] [address] [facility] [place]. She is able to get up and walk but said the pain is not tolerable yet so she would also like to increase the MS Contin to 30 mg on Monday if possible.   She is also taking dilaudid every 4 hours and reports her bowels are moving with her last bowel movement this morning.    Discussed that Dr. Alvy Bimler would be able to see her for Monday for pain control if needed but she said she thinks she will be ok with the increase in MS Contin on Monday and will call if needed.

## 2020-05-01 NOTE — Progress Notes (Signed)
Histology and Location of Primary Cancer:ovarian   Sites of Visceral and Bony Metastatic Disease: left greater trochanter  Location(s) of Symptomatic Metastases: left greater trochanter  Past/Anticipated chemotherapy by medical oncology, if any: 2017 had surgery for debulking with chemo therapy no chemotherapy scheduled at this time  Pain on a scale of 0-10 is 9 in her left hip that radiates down her leg.   If Spine Met(s), symptoms, if any, include:  Bowel/Bladder retention or incontinence (please describe): n/a  Numbness or weakness in extremities (please describe): reports total body is numb  Current Decadron regimen, if applicable: yes 4 mg daily  Ambulatory status ambulatory and has a walker at home for prn use. SAFETY ISSUES:  Prior radiation? no  Pacemaker/ICD? no  Possible current pregnancy? hysterectomy  Is the patient on methotrexate? no  Current Complaints / other details:  Waiting to pick up a pain med today and BP high.  BP (!) 209/116 (BP Location: Left Arm, Patient Position: Sitting)   Pulse 73   Temp (!) 97 F (36.1 C) (Temporal)   Resp 18   Ht $R'5\' 9"'wp$  (1.753 m)   Wt 199 lb (90.3 kg)   SpO2 100%   BMI 29.39 kg/m   Wt Readings from Last 3 Encounters:  05/04/20 199 lb (90.3 kg)  04/28/20 195 lb 9.6 oz (88.7 kg)  04/24/20 191 lb 3.2 oz (86.7 kg)

## 2020-05-04 ENCOUNTER — Ambulatory Visit
Admission: RE | Admit: 2020-05-04 | Discharge: 2020-05-04 | Disposition: A | Discharge status: Home / Self Care | Payer: Medicare Other | Source: Ambulatory Visit | Attending: Radiation Oncology | Admitting: Radiation Oncology

## 2020-05-04 ENCOUNTER — Encounter: Payer: Self-pay | Admitting: Radiation Oncology

## 2020-05-04 ENCOUNTER — Other Ambulatory Visit: Payer: Self-pay

## 2020-05-04 ENCOUNTER — Other Ambulatory Visit: Payer: Self-pay | Admitting: Hematology and Oncology

## 2020-05-04 ENCOUNTER — Telehealth: Payer: Self-pay | Admitting: Oncology

## 2020-05-04 VITALS — BP 209/116 | HR 73 | Temp 97.0°F | Resp 18 | Ht 69.0 in | Wt 199.0 lb

## 2020-05-04 DIAGNOSIS — I509 Heart failure, unspecified: Secondary | ICD-10-CM | POA: Diagnosis not present

## 2020-05-04 DIAGNOSIS — C786 Secondary malignant neoplasm of retroperitoneum and peritoneum: Secondary | ICD-10-CM | POA: Diagnosis not present

## 2020-05-04 DIAGNOSIS — C569 Malignant neoplasm of unspecified ovary: Secondary | ICD-10-CM | POA: Insufficient documentation

## 2020-05-04 DIAGNOSIS — I714 Abdominal aortic aneurysm, without rupture: Secondary | ICD-10-CM | POA: Diagnosis not present

## 2020-05-04 DIAGNOSIS — C7951 Secondary malignant neoplasm of bone: Secondary | ICD-10-CM | POA: Insufficient documentation

## 2020-05-04 DIAGNOSIS — Z79899 Other long term (current) drug therapy: Secondary | ICD-10-CM | POA: Insufficient documentation

## 2020-05-04 DIAGNOSIS — Z8673 Personal history of transient ischemic attack (TIA), and cerebral infarction without residual deficits: Secondary | ICD-10-CM | POA: Insufficient documentation

## 2020-05-04 DIAGNOSIS — G893 Neoplasm related pain (acute) (chronic): Secondary | ICD-10-CM

## 2020-05-04 DIAGNOSIS — C787 Secondary malignant neoplasm of liver and intrahepatic bile duct: Secondary | ICD-10-CM | POA: Insufficient documentation

## 2020-05-04 DIAGNOSIS — J910 Malignant pleural effusion: Secondary | ICD-10-CM | POA: Diagnosis not present

## 2020-05-04 DIAGNOSIS — I700 Atherosclerosis of aorta: Secondary | ICD-10-CM | POA: Diagnosis not present

## 2020-05-04 DIAGNOSIS — I10 Essential (primary) hypertension: Secondary | ICD-10-CM | POA: Insufficient documentation

## 2020-05-04 DIAGNOSIS — M47819 Spondylosis without myelopathy or radiculopathy, site unspecified: Secondary | ICD-10-CM | POA: Diagnosis not present

## 2020-05-04 DIAGNOSIS — Z7982 Long term (current) use of aspirin: Secondary | ICD-10-CM | POA: Insufficient documentation

## 2020-05-04 DIAGNOSIS — Z87891 Personal history of nicotine dependence: Secondary | ICD-10-CM | POA: Diagnosis not present

## 2020-05-04 DIAGNOSIS — M5136 Other intervertebral disc degeneration, lumbar region: Secondary | ICD-10-CM | POA: Insufficient documentation

## 2020-05-04 DIAGNOSIS — M898X9 Other specified disorders of bone, unspecified site: Secondary | ICD-10-CM

## 2020-05-04 MED ORDER — MORPHINE SULFATE ER 30 MG PO TBCR: 30.0000 mg | EXTENDED_RELEASE_TABLET | Freq: Three times a day (TID) | ORAL | 0 refills | Status: DC

## 2020-05-04 MED ORDER — DEXAMETHASONE 4 MG PO TABS
4.0000 mg | ORAL_TABLET | Freq: Every day | ORAL | 1 refills | Status: DC
Start: 2020-05-04 — End: 2020-06-25

## 2020-05-04 NOTE — Telephone Encounter (Signed)
Heidi Johnson called and said she picked up the MS Contin 30 mg and was told by the pharmacy that her insurance will not cover the 3 tablets per day and needs a prior authorization entered.  She paid out of pocket today but said she can be reimbursed if we have the prior auth approved.

## 2020-05-04 NOTE — Progress Notes (Signed)
Radiation Oncology         [phone] ________________________________  Initial Outpatient Consultation  Name: Heidi Johnson MRN: [id number]  Date: 05/04/2020  DOB: [birth year]  CC:Pcp, No  [name], MD   REFERRING PHYSICIAN: [name], MD  DIAGNOSIS: The primary encounter diagnosis was Malignant neoplasm of ovary, unspecified laterality ([place]). A diagnosis of Malignant bone pain was also pertinent to this visit.  Stage IV (rcT3c, cN0, cM1) ovarian cancer  HISTORY OF PRESENT ILLNESS::Heidi Johnson is a 49 y.o. female who is seen as a courtesy of Dr. Alvy Bimler for an opinion concerning radiation therapy as part of management for her recently diagnosed ovarian cancer. . The patient presented to Dr. Alvy Bimler on 12/18/2019 for transfer of care from [facility]. Summarized history of her oncology history is as follows:  CT scan of abdomen and pelvis on 04/25/2016 showed a 12 cm ovarian mass with extensive peritoneal metastases and malignancy pleural effusions. The patient underwent an exploratory laparotomy with posterior exenteration, diaphragm stripping, cholecystectomy, omentectomy, appendectomy, and optimal tumor debulking on 05/05/2016.  The patient underwent chemotherapy with Taxol and Carboplatin from 06/24/2016 to 10/24/2016. Taxol was discontinued on cycle #5 secondary to neuropathy. She then underwent antiestrogen oral therapy with Letrozole from 10/24/2016 to 08/20/2018. Of note, maintenance Avastin was discontinued on 08/20/2018 secondary to TIA and uncontrolled hypertension.  CT scan of chest on 02/05/2019 showed numerous left predominant calcified pleural metastases and an associated small left pleural effusion, all of which were unchanged. CT scan of abdomen and pelvis on 02/07/2019 showed an overall stable small volume peritoneal tumor with an unchanged infrarenal abdominal aortic aneurysm.   CT scan of chest on 11/08/2019 showed stable calcified and non-calcified left-sided  nodular and plaque-like pleural abnormalities with a small, chronic left pleural effusion that was compatible with stable treated pleural metastasis. There were no new or enlarging lesions, nor were there any enlarging thoracic lymph nodes. However, there was re-demonstration of a left breast mass.  Since transferring care, the patient underwent a CT scan of chest/abdomen/pelvis on 01/09/2020 that showed treated left pleural and peritoneal metastatic disease. Areas of residual soft tissue prominence in the right anatomic pelvis were indeterminate. It also showed hepatomegaly with low-attenuation lesions in the liver that were too small to characterize. Additionally, there was a small left fibrothorax, an infrarenal aortic aneurysm, aortic atherosclerosis, and a 2.0 x 3.1 cm soft tissue mass in the lateral left breast.  The patient's case was presented at the Gynecologic Oncology Multi-Disciplinary Conference on 01/27/2020, during which time it was recommended that she proceed with observation followed by repeat imaging.  The patient was seen in the ED on 04/03/2020 with complaints of left flank pain and shortness of breath. CTA of chest at that time showed a stable appearance of calcified nodular pleural disease in the left hemithorax that was consistent with treated metastatic disease. There was also noted to be stable chronic pleural fluid/thickening in the posterior left costophrenic sulcus as well as a 3.1 x 1.8 cm nodular soft tissue lesion in the lateral left breast that appeared stable in the interval. There was no evidence for acute PE. CT scan of abdomen and pelvis showed a stable appearance of the calcified omental and mesenteric nodules that was consistent with treated metastatic disease. It also showed soft tissue fullness in the right adnexal region that was similar in appearance. Finally, there was a stable 1.6 x 1.2 cm low-density lesion on the lateral segment of the left liver and a 3.5  cm  infrarenal abdominal aortic aneurysm.  The patient began to report severe bone pain and underwent a bone scan on 04/14/2020. Results showed a focus of abnormal uptake involving the greater trochanter of the proximal left femur, corresponding to a sclerotic lesion seen on prior CT scan and was concerning for metastatic disease.  The patient then underwent an MRI of the left hip on 04/23/2020, which showed an 18 mm bone lesion in the peripheral superior aspect of the left greater trochanter with enhancement on post-contrast imaging. The overall appearance was most concerning for metastatic disease in the setting of known malignancy. 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   The patient was last seen by Dr. Alvy Bimler on 04/28/2020, during which time it was recommend that she consult with radiation oncology regarding palliative radiation treatment.  PREVIOUS RADIATION THERAPY: No  PAST MEDICAL HISTORY:  Past Medical History:  Diagnosis Date  . Cancer ([place])    ovarian cancer  . CHF (congestive heart failure) ([place])   . Hypertension   . Panic attacks   . Stroke [facility])     PAST SURGICAL HISTORY: Past Surgical History:  Procedure Laterality Date  . ABDOMINAL HYSTERECTOMY    . BACK SURGERY    . TUBAL LIGATION      FAMILY HISTORY:  Family History  Problem Relation Age of Onset  . Cancer Maternal Aunt        breast ca/GYN    SOCIAL HISTORY:  Social History   Tobacco Use  . Smoking status: Former Smoker    Types: Cigarettes    Quit date: 06/20/2018    Years since quitting: 1.8  . Smokeless tobacco: Never Used  Substance Use Topics  . Alcohol use: No  . Drug use: Yes    Types: Marijuana    ALLERGIES:  Allergies  Allergen Reactions  . Latex Rash and Hives  . Sulfamethoxazole-Trimethoprim Other (See Comments)    Reaction:  Unknown  Other reaction(s):  Unknown When 17, dr told her not to take    MEDICATIONS:  Current Outpatient Medications  Medication Sig Dispense Refill  . albuterol (VENTOLIN HFA) 108 (90 Base) MCG/ACT inhaler Inhale 2 puffs into the lungs as needed. 2 puffs into lungs every 6 hours as needed for wheezing or SOB    . aspirin 325 MG EC tablet Take 325 mg by mouth daily.    . clonazePAM (KLONOPIN) 1 MG tablet Take 1 mg by mouth 3 (three) times daily as needed for anxiety.    . cyclobenzaprine (FLEXERIL) 5 MG tablet TAKE 1 TABLET (5 MG TOTAL) BY MOUTH 3 (THREE) TIMES DAILY AS NEEDED. 60 tablet 1  . dexamethasone (DECADRON) 4 MG tablet Take 1 tablet (4 mg total) by mouth daily. 30 tablet 1  . DILAUDID 2 MG tablet Take 1 tablet (2 mg total) by mouth 4 (four) times daily as needed for severe pain. 90 tablet 0  . docusate sodium (COLACE) 100 MG capsule Take 100 mg by mouth 2 (two) times daily.    Marland Kitchen gabapentin (NEURONTIN) 300 MG capsule Take 3 capsules (900 mg total) by mouth 3 (three) times daily. 270 capsule 4  . hydrochlorothiazide (HYDRODIURIL) 25 MG tablet Take 25 mg by mouth daily.    Marland Kitchen ibuprofen (ADVIL) 600 MG tablet Take 600 mg by mouth every 6 (six) hours as needed.    . lidocaine-prilocaine (EMLA) cream  Apply 1 application topically as needed (port).     Marland Kitchen lisinopril-hydrochlorothiazide (ZESTORETIC) 20-25 MG tablet Take 1 tablet by mouth 2 (two) times daily.    Marland Kitchen loperamide (IMODIUM A-D) 2 MG tablet Take 2 mg by mouth 4 (four) times daily as needed for diarrhea or loose stools.    Marland Kitchen MARINOL 5 MG capsule Take 5 mg by mouth 3 (three) times daily.    Marland Kitchen morphine (MS CONTIN) 30 MG 12 hr tablet Take 1 tablet (30 mg total) by mouth every 8 (eight) hours for 7 days. 21 tablet 0  . naloxone (NARCAN) 2 MG/2ML injection Place 2 mg into the nose as needed.     Marland Kitchen OLANZapine (ZYPREXA) 10 MG tablet Take 10 mg by mouth at bedtime.    . ondansetron (ZOFRAN) 8 MG tablet Take 8 mg by mouth every 8 (eight) hours as needed for nausea.     .  rosuvastatin (CRESTOR) 40 MG tablet Take 40 mg by mouth daily.    . simethicone (MYLICON) 80 MG chewable tablet Chew 80 mg by mouth every 6 (six) hours as needed for flatulence.      No current facility-administered medications for this encounter.   Facility-Administered Medications Ordered in Other Encounters  Medication Dose Route Frequency Provider Last Rate Last Admin  . heparin lock flush 100 unit/mL  500 Units Intracatheter Once [name], MD        REVIEW OF SYSTEMS:  A 10+ POINT REVIEW OF SYSTEMS WAS OBTAINED including neurology, dermatology, psychiatry, cardiac, respiratory, lymph, extremities, GI, GU, musculoskeletal, constitutional, reproductive, HEENT.  She reports intense pain in the left proximal femur particularly with standing and walking   PHYSICAL EXAM:  height is 5\' 9"  (1.753 m) and weight is 199 lb (90.3 kg). Her temporal temperature is 97 F (36.1 C) (abnormal). Her blood pressure is 209/116 (abnormal) and her pulse is 73. Her respiration is 18 and oxygen saturation is 100%.   General: Alert and oriented, in no acute distress HEENT: Head is normocephalic. Extraocular movements are intact.  Neck: Neck is supple, no palpable cervical or supraclavicular lymphadenopathy. Heart: Regular in rate and rhythm with no murmurs, rubs, or gallops. Chest: Clear to auscultation bilaterally, with no rhonchi, wheezes, or rales. Abdomen: Soft, nontender, nondistended, with no rigidity or guarding. Extremities: No cyanosis or edema. Lymphatics: see Neck Exam Skin: No concerning lesions. Musculoskeletal: symmetric strength and muscle tone throughout. Neurologic: Cranial nerves II through XII are grossly intact. No obvious focalities. Speech is fluent. Coordination is intact. Psychiatric: Judgment and insight are intact. Affect is appropriate. Palpation along the left lateral pelvis area reveals tenderness in the region of the greater trochanter  ECOG = 1  0 - Asymptomatic (Fully  active, able to carry on all predisease activities without restriction)  1 - Symptomatic but completely ambulatory (Restricted in physically strenuous activity but ambulatory and able to carry out work of a light or sedentary nature. For example, light housework, office work)  2 - Symptomatic, <50% in bed during the day (Ambulatory and capable of all self care but unable to carry out any work activities. Up and about more than 50% of waking hours)  3 - Symptomatic, >50% in bed, but not bedbound (Capable of only limited self-care, confined to bed or chair 50% or more of waking hours)  4 - Bedbound (Completely disabled. Cannot carry on any self-care. Totally confined to bed or chair)  5 - Death   Eustace Pen MM, Creech RH, Tormey DC, et al. (  1982). "Toxicity and response criteria of the [facility] Group". [place] Oncol. 5 (6): 649-55  LABORATORY DATA:  Lab Results  Component Value Date   WBC 9.8 04/03/2020   HGB 13.7 04/03/2020   HCT 43.1 04/03/2020   MCV 90.0 04/03/2020   PLT 204 04/03/2020   NEUTROABS 6.7 04/03/2020   Lab Results  Component Value Date   NA 138 04/03/2020   K 3.8 04/03/2020   CL 103 04/03/2020   CO2 25 04/03/2020   GLUCOSE 122 (H) 04/03/2020   CREATININE 0.73 04/03/2020   CALCIUM 9.2 04/03/2020      RADIOGRAPHY: MR HIP LEFT W WO CONTRAST  Result Date: 04/24/2020 CLINICAL DATA:  Acute left hip pain. EXAM: MRI OF THE LEFT HIP WITHOUT AND WITH CONTRAST TECHNIQUE: Multiplanar, multisequence MR imaging was performed both before and after administration of intravenous contrast. CONTRAST:  12mL GADAVIST GADOBUTROL 1 MMOL/ML IV SOLN COMPARISON:  None. FINDINGS: Bones: No hip fracture, dislocation or avascular necrosis. 18 mm T1 hypointense and T2 hyperintense bone lesion in the peripheral superior aspect of the left greater trochanter with enhancement on postcontrast imaging. 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 No  SI joint widening or erosive changes. Degenerative disease with disc height loss at L4-5 and L5-S1 with bilateral facet arthropathy. Articular cartilage and labrum Articular cartilage:  No chondral defect. Labrum: Grossly intact, but evaluation is limited by lack of intraarticular fluid. Joint or bursal effusion Joint effusion:  No hip joint effusion.  No SI joint effusion. Bursae:  No bursa formation. Muscles and tendons Flexors: Normal. Extensors: Normal. Abductors: Normal. Adductors: Normal. Gluteals: Normal. Hamstrings: Normal. Other findings No pelvic free fluid. No fluid collection or hematoma. No inguinal lymphadenopathy. No inguinal hernia. Diverticulosis of the [place] diverticulitis. IMPRESSION: 1. No hip fracture, dislocation or avascular necrosis. 2. A 18 mm bone lesion in the peripheral superior aspect of the left greater trochanter with enhancement on postcontrast imaging. The overall appearance is most concerning for metastatic disease in the setting of known malignancy. 3. 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 4. Degenerative disease with disc height loss at L4-5 and L5-S1 with bilateral facet arthropathy. Electronically Signed   By: Kathreen Devoid   On: 04/24/2020 09:03   NM Bone Scan Whole Body  Result Date: 04/15/2020 CLINICAL DATA:  Back pain.  History of ovarian cancer. EXAM: NUCLEAR MEDICINE WHOLE BODY BONE SCAN TECHNIQUE: Whole body anterior and posterior images were obtained approximately 3 hours after intravenous injection of radiopharmaceutical. RADIOPHARMACEUTICALS:  20.1 mCi Technetium-27m MDP IV COMPARISON:  April 03, 2020. FINDINGS: Focus of uptake is seen projected over the left upper rib cage that most likely represents overlying Port-A-Cath reservoir. There is a focus of abnormal uptake involving the greater trochanter of the proximal left femur which corresponds to sclerotic density seen on prior CT scan and is concerning for possible metastatic disease. Increased uptake is seen  involving both feet which most likely represents degenerative change. IMPRESSION: Focus of abnormal uptake is seen involving the greater trochanter of the proximal left femur which corresponds to sclerotic density seen on prior CT scan and is concerning for metastatic disease. Electronically Signed   By: Marijo Conception M.D.   On: 04/15/2020 08:31      IMPRESSION: Stage IV (rcT3c, cN0, cM1) ovarian cancer  The patient has a painful metastatic lesion in the left proximal femur.  This does not appear to need orthopedic stabilization in light of  the location.  She would be a good candidate for short course of palliative radiation therapy directed at this area.  Today, I talked to the patient  about the findings and work-up thus far.  We discussed the natural history of ovarian cancer and general treatment, highlighting the role of radiotherapy in the management.  We discussed the available radiation techniques, and focused on the details of logistics and delivery.  We reviewed the anticipated acute and late sequelae associated with radiation in this setting.  The patient was encouraged to ask questions that I answered to the best of my ability.  A patient consent form was discussed and signed.  We retained a copy for our records.  The patient would like to proceed with radiation and will be scheduled for CT simulation.  PLAN: Patient will return for CT simulation tomorrow morning with treatments to begin November 17.  Anticipate 10 treatments directed at the area of concern.  Total time spent in this encounter was 60 minutes which included reviewing the patient's extensive history of ovarian cancer, CT scans, PET scans, bone scan, chemotherapy, physical examination, and documentation.    ------------------------------------------------  Blair Promise, PhD, MD  This document serves as a record of services personally performed by Gery Pray, MD. It was created on his behalf by Clerance Lav, a  trained medical scribe. The creation of this record is based on the scribe's personal observations and the provider's statements to them. This document has been checked and approved by the attending provider.

## 2020-05-04 NOTE — Telephone Encounter (Signed)
Called Jrue and let her know that the refills for MS Contin and dexamethasone have been sent to [facility].  Also that the dose of the MS Contin has been increased to 30 mg and that Dr. Alvy Bimler sent in a one week supply in case she needs to change the dose.  Doralyn verbalized agreement.

## 2020-05-05 ENCOUNTER — Other Ambulatory Visit: Payer: Self-pay

## 2020-05-05 ENCOUNTER — Ambulatory Visit
Admission: RE | Admit: 2020-05-05 | Discharge: 2020-05-05 | Disposition: A | Discharge status: Home / Self Care | Payer: Medicare Other | Source: Ambulatory Visit | Attending: Radiation Oncology | Admitting: Radiation Oncology

## 2020-05-05 ENCOUNTER — Telehealth: Payer: Self-pay | Admitting: Oncology

## 2020-05-05 DIAGNOSIS — G893 Neoplasm related pain (acute) (chronic): Secondary | ICD-10-CM

## 2020-05-05 DIAGNOSIS — Z510 Encounter for antineoplastic radiation therapy: Secondary | ICD-10-CM | POA: Insufficient documentation

## 2020-05-05 DIAGNOSIS — M898X9 Other specified disorders of bone, unspecified site: Secondary | ICD-10-CM

## 2020-05-05 DIAGNOSIS — C569 Malignant neoplasm of unspecified ovary: Secondary | ICD-10-CM | POA: Diagnosis not present

## 2020-05-05 DIAGNOSIS — C7951 Secondary malignant neoplasm of bone: Secondary | ICD-10-CM | POA: Insufficient documentation

## 2020-05-05 NOTE — Telephone Encounter (Signed)
Heidi Johnson with appointment to see Dr. Alvy Bimler on 05/11/20 at 1:45 to discuss pain management.  Heidi Johnson verbalized understanding and agreement.

## 2020-05-06 ENCOUNTER — Ambulatory Visit
Admission: RE | Admit: 2020-05-06 | Discharge: 2020-05-06 | Disposition: A | Discharge status: Home / Self Care | Payer: Medicare Other | Source: Ambulatory Visit | Attending: Radiation Oncology | Admitting: Radiation Oncology

## 2020-05-06 DIAGNOSIS — Z510 Encounter for antineoplastic radiation therapy: Secondary | ICD-10-CM | POA: Diagnosis not present

## 2020-05-07 ENCOUNTER — Ambulatory Visit
Admission: RE | Admit: 2020-05-07 | Discharge: 2020-05-07 | Disposition: A | Discharge status: Home / Self Care | Payer: Medicare Other | Source: Ambulatory Visit | Attending: Radiation Oncology | Admitting: Radiation Oncology

## 2020-05-07 DIAGNOSIS — Z510 Encounter for antineoplastic radiation therapy: Secondary | ICD-10-CM | POA: Diagnosis not present

## 2020-05-08 ENCOUNTER — Other Ambulatory Visit: Payer: Self-pay

## 2020-05-08 ENCOUNTER — Ambulatory Visit
Admission: RE | Admit: 2020-05-08 | Discharge: 2020-05-08 | Disposition: A | Discharge status: Home / Self Care | Payer: Medicare Other | Source: Ambulatory Visit | Attending: Radiation Oncology | Admitting: Radiation Oncology

## 2020-05-08 DIAGNOSIS — Z510 Encounter for antineoplastic radiation therapy: Secondary | ICD-10-CM | POA: Diagnosis not present

## 2020-05-10 ENCOUNTER — Ambulatory Visit
Admission: RE | Admit: 2020-05-10 | Discharge: 2020-05-10 | Disposition: A | Discharge status: Home / Self Care | Payer: Medicare Other | Source: Ambulatory Visit | Attending: Radiation Oncology | Admitting: Radiation Oncology

## 2020-05-10 ENCOUNTER — Other Ambulatory Visit: Payer: Self-pay

## 2020-05-10 DIAGNOSIS — Z510 Encounter for antineoplastic radiation therapy: Secondary | ICD-10-CM | POA: Diagnosis not present

## 2020-05-11 ENCOUNTER — Ambulatory Visit
Admission: RE | Admit: 2020-05-11 | Discharge: 2020-05-11 | Disposition: A | Discharge status: Home / Self Care | Payer: Medicare Other | Source: Ambulatory Visit | Attending: Radiation Oncology | Admitting: Radiation Oncology

## 2020-05-11 ENCOUNTER — Inpatient Hospital Stay (HOSPITAL_BASED_OUTPATIENT_CLINIC_OR_DEPARTMENT_OTHER): Payer: Medicare Other | Admitting: Hematology and Oncology

## 2020-05-11 ENCOUNTER — Other Ambulatory Visit: Payer: Self-pay

## 2020-05-11 ENCOUNTER — Telehealth: Payer: Self-pay | Admitting: Oncology

## 2020-05-11 ENCOUNTER — Encounter: Payer: Self-pay | Admitting: Hematology and Oncology

## 2020-05-11 ENCOUNTER — Other Ambulatory Visit: Payer: Self-pay | Admitting: Hematology and Oncology

## 2020-05-11 DIAGNOSIS — G894 Chronic pain syndrome: Secondary | ICD-10-CM | POA: Diagnosis not present

## 2020-05-11 DIAGNOSIS — C569 Malignant neoplasm of unspecified ovary: Secondary | ICD-10-CM | POA: Diagnosis not present

## 2020-05-11 DIAGNOSIS — Z510 Encounter for antineoplastic radiation therapy: Secondary | ICD-10-CM | POA: Diagnosis not present

## 2020-05-11 DIAGNOSIS — K5909 Other constipation: Secondary | ICD-10-CM

## 2020-05-11 DIAGNOSIS — Z5111 Encounter for antineoplastic chemotherapy: Secondary | ICD-10-CM | POA: Diagnosis not present

## 2020-05-11 MED ORDER — MORPHINE SULFATE ER 30 MG PO TBCR: 30.0000 mg | EXTENDED_RELEASE_TABLET | Freq: Three times a day (TID) | ORAL | 0 refills | Status: DC

## 2020-05-11 MED ORDER — DILAUDID 2 MG PO TABS
2.0000 mg | ORAL_TABLET | Freq: Four times a day (QID) | ORAL | 0 refills | Status: DC | PRN
Start: 2020-05-11 — End: 2020-06-01

## 2020-05-11 MED FILL — MORPHINE SULF ER 30 MG TAB: 30 | 30 days supply | Qty: 90 | Fill #0

## 2020-05-11 MED FILL — HYDROmorphone HCL 2 MG TABS: 2 | 22 days supply | Qty: 90 | Fill #0

## 2020-05-11 NOTE — Assessment & Plan Note (Signed)
She tolerated radiation therapy better except for some fatigue For now, the plan would be to complete radiation therapy followed by treatment break and then repeat imaging study and possibly chemotherapy in the near future I will see her back next month for further follow-up

## 2020-05-11 NOTE — Assessment & Plan Note (Signed)
We discussed the importance of aggressive laxative therapy while on chronic pain medicine

## 2020-05-11 NOTE — Assessment & Plan Note (Signed)
She had recent acute on chronic pain exacerbated by new findings of malignant bone lesion With the addition of dexamethasone and radiation treatment, her pain control has improved For now, I plan to continue MS Contin 30 mg 3 times a day along with Dilaudid at 2 mg as needed every 6 hours or so I do not plan to escalate the current dose of treatment She is exhibiting signs of sedation I am hoping to gradually taper her pain medicine once radiation treatment is completed I will see her next month for further follow-up

## 2020-05-11 NOTE — Progress Notes (Signed)
[place] OFFICE PROGRESS NOTE  Patient Care Team: Pcp, No as PCP - General  ASSESSMENT & PLAN:  Ovarian cancer ([place]) She tolerated radiation therapy better except for some fatigue For now, the plan would be to complete radiation therapy followed by treatment break and then repeat imaging study and possibly chemotherapy in the near future I will see her back next month for further follow-up  Chronic pain syndrome She had recent acute on chronic pain exacerbated by new findings of malignant bone lesion With the addition of dexamethasone and radiation treatment, her pain control has improved For now, I plan to continue MS Contin 30 mg 3 times a day along with Dilaudid at 2 mg as needed every 6 hours or so I do not plan to escalate the current dose of treatment She is exhibiting signs of sedation I am hoping to gradually taper her pain medicine once radiation treatment is completed I will see her next month for further follow-up  Other constipation We discussed the importance of aggressive laxative therapy while on chronic pain medicine   No orders of the defined types were placed in this encounter.   All questions were answered. The patient knows to call the clinic with any problems, questions or concerns. The total time spent in the appointment was 20 minutes encounter with patients including review of chart and various tests results, discussions about plan of care and coordination of care plan   [name], MD 05/11/2020 4:15 PM  INTERVAL HISTORY: Please see below for problem oriented charting. She returns with her family members for further follow-up and chronic pain management Since she started radiation treatment, she felt that her pain control has improved She complained of mild sedation recently On average, she take her MS Contin as scheduled every 8 hours and breakthrough Dilaudid as needed in between those doses She took an average 4 doses of Dilaudid  per day She noticed some slight constipation but no nausea  SUMMARY OF ONCOLOGIC HISTORY: Oncology History Overview Note  Negative genetics at [facility]   Ovarian cancer ([place])  04/25/2016 Imaging   CT confirms 12cm ovarian masses with extensive peritoneal metastases and malignant pleural effusions   05/05/2016 Surgery   s/p ex-lap posterior exenteration, diaphragm stripping, cholecystectomy, omentectomy, appendectomy and optimal tumor debulking on 05/05/16   06/24/2016 - 10/24/2016 Chemotherapy   Started Taxol/Carboplatin. 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  Numerous left predominant calcified pleural metastases and associated small left pleural effusion are unchanged.  2.  Unchanged (3.0 cm) (series 3, image 96) left breast mass    02/07/2019 Imaging   Outside Ct abdomen and pelvis 1. Overall stable small volume peritoneal tumor, as described above.  2. Unchanged infrarenal abdominal aortic aneurysm.    02/22/2019 Tumor Marker   Patient's tumor was tested for the following markers: CA-125 Results of the tumor marker test revealed 164   03/18/2019 Tumor Marker   Patient's tumor was tested for the following markers: CA-125 Results of the tumor marker test revealed 162   04/08/2019 Tumor Marker  Patient's tumor was tested for the following markers: CA-125 Results of the tumor marker test revealed 164   04/29/2019 Tumor Marker   Patient's tumor was tested for the following markers: CA-125 Results of the tumor marker test revealed 181   05/20/2019 Tumor Marker    Patient's tumor was tested for the following markers: CA-125 Results of the tumor marker test revealed 164   06/10/2019 Tumor Marker   Patient's tumor was tested for the following markers: CA-125 Results of the tumor marker test revealed 152   07/01/2019 Tumor Marker   Patient's tumor was tested for the following markers: CA-125 Results of the tumor marker test revealed 160   07/22/2019 Tumor Marker   Patient's tumor was tested for the following markers: CA-125 Results of the tumor marker test revealed 171   08/12/2019 Tumor Marker   Patient's tumor was tested for the following markers: CA-125 Results of the tumor marker test revealed 173   09/02/2019 Tumor Marker   Patient's tumor was tested for the following markers: CA-125 Results of the tumor marker test revealed 159   09/30/2019 Tumor Marker   Patient's tumor was tested for the following markers: CA-125 Results of the tumor marker test revealed 168   11/08/2019 Imaging   Outside CT imaging 1. Study was performed at outside institution on 11/08/2019 and presented for review on 11/14/2019.  2. Stable calcified and noncalcified left-sided nodular and plaque-like pleural abnormalities with small chronic left pleural effusion compatible with stable treated pleural metastasis. No new or enlarging lesions.  3. No enlarging thoracic lymph nodes.  4. Redemonstration of left breast mass. Correlation with mammography is suggested if not already performed   12/02/2019 Tumor Marker   Patient's tumor was tested for the following markers: CA-125 Results of the tumor marker test revealed 186   12/18/2019 Cancer Staging   Staging form: Ovary, Fallopian Tube, and Primary Peritoneal Carcinoma, AJCC 8th Edition - Clinical stage from 12/18/2019: FIGO Stage IV (rcT3c, cN0, cM1) - Signed by [name], MD on 04/24/2020   01/09/2020 Tumor Marker   Patient's tumor was tested for the following markers: CA-125 Results of the tumor marker test revealed 184    01/10/2020 Imaging   1. Treated left pleural and peritoneal metastatic disease. Areas of residual soft tissue prominence in the right anatomic pelvis are indeterminate. Comparison with prior exams would be helpful. 2. Hepatomegaly. Low-attenuation lesions in the liver are too small to characterize. Comparison with prior exams would be helpful. 3. Small left fibrothorax. 4. Infrarenal Aortic aneurysm NOS (ICD10-I71.9). Recommend followup by ultrasound in 2 years.  5. Aortic atherosclerosis (ICD10-I70.0). Coronary artery calcification.   03/09/2020 Tumor Marker   Patient's tumor was tested for the following markers: CA-125 Results of the tumor marker test revealed 214   04/01/2020 Tumor Marker   Patient's tumor was tested for the following markers: CA-125 Results of the tumor marker test revealed 245.   04/03/2020 Imaging   1. No acute findings in the abdomen or pelvis. Specifically, no findings to explain the patient's history of left upper quadrant pain with nausea and vomiting. 2. Stable appearance of calcified omental and mesenteric nodules consistent with treated metastatic disease. Soft tissue fullness seen in the right adnexal region on the prior exam is similar today. 3. Stable 1.6 x 1.2 cm low-density lesion lateral segment left liver. 4. 3.5 cm infrarenal abdominal aortic aneurysm. Recommend follow-up ultrasound every 2 years. This recommendation follows ACR consensus guidelines: White Paper of the ACR Incidental Findings Committee  II on Vascular Findings. J Am Coll Radiol 2013; 64:332-951. 5. Aortic Atherosclerosis (ICD10-I70.0).   04/15/2020 Imaging   Focus of abnormal uptake is seen involving the greater trochanter of the proximal left femur which corresponds to sclerotic density seen on prior CT scan and is concerning for metastatic disease.   04/23/2020 Imaging   1. No hip fracture, dislocation or avascular necrosis. 2. A 18 mm bone lesion in the peripheral superior aspect of  the left greater trochanter with enhancement on postcontrast imaging. The overall appearance is most concerning for metastatic disease in the setting of known malignancy. 3. 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 4. Degenerative disease with disc height loss at L4-5 and L5-S1 with bilateral facet arthropathy.     REVIEW OF SYSTEMS:   Constitutional: Denies fevers, chills or abnormal weight loss Eyes: Denies blurriness of vision Ears, nose, mouth, throat, and face: Denies mucositis or sore throat Respiratory: Denies cough, dyspnea or wheezes Cardiovascular: Denies palpitation, chest discomfort or lower extremity swelling Skin: Denies abnormal skin rashes Lymphatics: Denies new lymphadenopathy or easy bruising Neurological:Denies numbness, tingling or new weaknesses Behavioral/Psych: Mood is stable, no new changes  All other systems were reviewed with the patient and are negative.  I have reviewed the past medical history, past surgical history, social history and family history with the patient and they are unchanged from previous note.  ALLERGIES:  is allergic to latex and sulfamethoxazole-trimethoprim.  MEDICATIONS:  Current Outpatient Medications  Medication Sig Dispense Refill  . albuterol (VENTOLIN HFA) 108 (90 Base) MCG/ACT inhaler Inhale 2 puffs into the lungs as needed. 2 puffs into lungs every 6 hours as needed for wheezing or SOB    . aspirin 325 MG EC tablet Take 325 mg by mouth daily.    . clonazePAM (KLONOPIN) 1 MG tablet Take 1 mg by mouth 3 (three) times daily as needed for anxiety.    . cyclobenzaprine (FLEXERIL) 5 MG tablet TAKE 1 TABLET (5 MG TOTAL) BY MOUTH 3 (THREE) TIMES DAILY AS NEEDED. 60 tablet 1  . dexamethasone (DECADRON) 4 MG tablet Take 1 tablet (4 mg total) by mouth daily. 30 tablet 1  . DILAUDID 2 MG tablet Take 1 tablet (2 mg total) by mouth 4 (four) times daily as needed for severe pain. 90 tablet 0  . docusate sodium (COLACE) 100 MG capsule Take 100  mg by mouth 2 (two) times daily.    Marland Kitchen gabapentin (NEURONTIN) 300 MG capsule Take 3 capsules (900 mg total) by mouth 3 (three) times daily. 270 capsule 4  . hydrochlorothiazide (HYDRODIURIL) 25 MG tablet Take 25 mg by mouth daily.    Marland Kitchen ibuprofen (ADVIL) 600 MG tablet Take 600 mg by mouth every 6 (six) hours as needed.    . lidocaine-prilocaine (EMLA) cream Apply 1 application topically as needed (port).     Marland Kitchen lisinopril-hydrochlorothiazide (ZESTORETIC) 20-25 MG tablet Take 1 tablet by mouth 2 (two) times daily.    Marland Kitchen loperamide (IMODIUM A-D) 2 MG tablet Take 2 mg by mouth 4 (four) times daily as needed for diarrhea or loose stools.    Marland Kitchen MARINOL 5 MG capsule Take 5 mg by mouth 3 (three) times daily.    Marland Kitchen morphine (MS CONTIN) 30 MG 12 hr tablet Take 1 tablet (30 mg total) by mouth every 8 (eight) hours. 90 tablet 0  . naloxone (NARCAN) 2 MG/2ML injection Place 2 mg into the nose as needed.     Marland Kitchen OLANZapine (ZYPREXA) 10 MG tablet Take 10  mg by mouth at bedtime.    . ondansetron (ZOFRAN) 8 MG tablet Take 8 mg by mouth every 8 (eight) hours as needed for nausea.     . rosuvastatin (CRESTOR) 40 MG tablet Take 40 mg by mouth daily.    . simethicone (MYLICON) 80 MG chewable tablet Chew 80 mg by mouth every 6 (six) hours as needed for flatulence.      No current facility-administered medications for this visit.   Facility-Administered Medications Ordered in Other Visits  Medication Dose Route Frequency Provider Last Rate Last Admin  . heparin lock flush 100 unit/mL  500 Units Intracatheter Once [name], MD        PHYSICAL EXAMINATION: ECOG PERFORMANCE STATUS: 1 - Symptomatic but completely ambulatory  Vitals:   05/11/20 1349  BP: 118/81  Pulse: 65  Resp: 18  SpO2: 99%   Filed Weights   05/11/20 1349  Weight: 198 lb 6.4 oz (90 kg)    GENERAL:alert, no distress and comfortable NEURO: alert & oriented x 3 with fluent speech, no focal motor/sensory deficits  LABORATORY DATA:  I have  reviewed the data as listed    Component Value Date/Time   NA 138 04/03/2020 0145   K 3.8 04/03/2020 0145   CL 103 04/03/2020 0145   CO2 25 04/03/2020 0145   GLUCOSE 122 (H) 04/03/2020 0145   BUN 21 (H) 04/03/2020 0145   CREATININE 0.73 04/03/2020 0145   CREATININE 0.71 01/09/2020 1404   CALCIUM 9.2 04/03/2020 0145   PROT 7.2 04/03/2020 0145   ALBUMIN 4.4 04/03/2020 0145   AST 24 04/03/2020 0145   AST 27 01/09/2020 1404   ALT 17 04/03/2020 0145   ALT 27 01/09/2020 1404   ALKPHOS 89 04/03/2020 0145   BILITOT 1.0 04/03/2020 0145   BILITOT 0.5 01/09/2020 1404   GFRNONAA >60 04/03/2020 0145   GFRNONAA >60 01/09/2020 1404   GFRAA >60 03/09/2020 1005   GFRAA >60 01/09/2020 1404    No results found for: SPEP, UPEP  Lab Results  Component Value Date   WBC 9.8 04/03/2020   NEUTROABS 6.7 04/03/2020   HGB 13.7 04/03/2020   HCT 43.1 04/03/2020   MCV 90.0 04/03/2020   PLT 204 04/03/2020      Chemistry      Component Value Date/Time   NA 138 04/03/2020 0145   K 3.8 04/03/2020 0145   CL 103 04/03/2020 0145   CO2 25 04/03/2020 0145   BUN 21 (H) 04/03/2020 0145   CREATININE 0.73 04/03/2020 0145   CREATININE 0.71 01/09/2020 1404      Component Value Date/Time   CALCIUM 9.2 04/03/2020 0145   ALKPHOS 89 04/03/2020 0145   AST 24 04/03/2020 0145   AST 27 01/09/2020 1404   ALT 17 04/03/2020 0145   ALT 27 01/09/2020 1404   BILITOT 1.0 04/03/2020 0145   BILITOT 0.5 01/09/2020 1404

## 2020-05-11 NOTE — Telephone Encounter (Signed)
Heidi Johnson left a message and said that she took her last Morphine tablet this morning and has a few dilaudid left - she has been taking them every 4 hours.  She is aware of her appointment with Dr. Alvy Bimler at 1:45 to discuss pain control and just wanted to let her know she is out of the morphine.

## 2020-05-12 ENCOUNTER — Ambulatory Visit
Admission: RE | Admit: 2020-05-12 | Discharge: 2020-05-12 | Disposition: A | Discharge status: Home / Self Care | Payer: Medicare Other | Source: Ambulatory Visit | Attending: Radiation Oncology | Admitting: Radiation Oncology

## 2020-05-12 DIAGNOSIS — Z510 Encounter for antineoplastic radiation therapy: Secondary | ICD-10-CM | POA: Diagnosis not present

## 2020-05-13 ENCOUNTER — Ambulatory Visit
Admission: RE | Admit: 2020-05-13 | Discharge: 2020-05-13 | Disposition: A | Discharge status: Home / Self Care | Payer: Medicare Other | Source: Ambulatory Visit | Attending: Radiation Oncology | Admitting: Radiation Oncology

## 2020-05-13 DIAGNOSIS — Z510 Encounter for antineoplastic radiation therapy: Secondary | ICD-10-CM | POA: Diagnosis not present

## 2020-05-18 ENCOUNTER — Other Ambulatory Visit: Payer: Self-pay | Admitting: Emergency Medicine

## 2020-05-18 ENCOUNTER — Inpatient Hospital Stay: Payer: Medicare Other

## 2020-05-18 ENCOUNTER — Inpatient Hospital Stay (HOSPITAL_BASED_OUTPATIENT_CLINIC_OR_DEPARTMENT_OTHER): Payer: Medicare Other | Admitting: Medical

## 2020-05-18 ENCOUNTER — Other Ambulatory Visit: Payer: Self-pay

## 2020-05-18 ENCOUNTER — Telehealth: Payer: Self-pay | Admitting: Oncology

## 2020-05-18 ENCOUNTER — Ambulatory Visit
Admission: RE | Admit: 2020-05-18 | Discharge: 2020-05-18 | Disposition: A | Discharge status: Home / Self Care | Payer: Medicare Other | Source: Ambulatory Visit | Attending: Radiation Oncology | Admitting: Radiation Oncology

## 2020-05-18 VITALS — BP 124/83 | HR 86 | Temp 97.0°F | Resp 18 | Ht 69.0 in | Wt 192.3 lb

## 2020-05-18 DIAGNOSIS — C569 Malignant neoplasm of unspecified ovary: Secondary | ICD-10-CM

## 2020-05-18 DIAGNOSIS — C786 Secondary malignant neoplasm of retroperitoneum and peritoneum: Secondary | ICD-10-CM

## 2020-05-18 DIAGNOSIS — G893 Neoplasm related pain (acute) (chronic): Secondary | ICD-10-CM

## 2020-05-18 DIAGNOSIS — Z510 Encounter for antineoplastic radiation therapy: Secondary | ICD-10-CM | POA: Diagnosis not present

## 2020-05-18 DIAGNOSIS — I9589 Other hypotension: Secondary | ICD-10-CM | POA: Diagnosis not present

## 2020-05-18 DIAGNOSIS — M898X9 Other specified disorders of bone, unspecified site: Secondary | ICD-10-CM

## 2020-05-18 DIAGNOSIS — Z5111 Encounter for antineoplastic chemotherapy: Secondary | ICD-10-CM | POA: Diagnosis not present

## 2020-05-18 LAB — CBC WITH DIFFERENTIAL (CANCER CENTER ONLY)
Abs Immature Granulocytes: 0.13 10*3/uL — ABNORMAL HIGH (ref 0.00–0.07)
Basophils Absolute: 0 10*3/uL (ref 0.0–0.1)
Basophils Relative: 0 %
Eosinophils Absolute: 0 10*3/uL (ref 0.0–0.5)
Eosinophils Relative: 0 %
HCT: 49.1 % — ABNORMAL HIGH (ref 36.0–46.0)
Hemoglobin: 16.2 g/dL — ABNORMAL HIGH (ref 12.0–15.0)
Immature Granulocytes: 1 %
Lymphocytes Relative: 7 %
Lymphs Abs: 0.9 10*3/uL (ref 0.7–4.0)
MCH: 29.2 pg (ref 26.0–34.0)
MCHC: 33 g/dL (ref 30.0–36.0)
MCV: 88.6 fL (ref 80.0–100.0)
Monocytes Absolute: 0.4 10*3/uL (ref 0.1–1.0)
Monocytes Relative: 3 %
Neutro Abs: 11.9 10*3/uL — ABNORMAL HIGH (ref 1.7–7.7)
Neutrophils Relative %: 89 %
Platelet Count: 205 10*3/uL (ref 150–400)
RBC: 5.54 MIL/uL — ABNORMAL HIGH (ref 3.87–5.11)
RDW: 17.1 % — ABNORMAL HIGH (ref 11.5–15.5)
WBC Count: 13.4 10*3/uL — ABNORMAL HIGH (ref 4.0–10.5)
nRBC: 0 % (ref 0.0–0.2)

## 2020-05-18 LAB — CMP (CANCER CENTER ONLY)
ALT: 33 U/L (ref 0–44)
AST: 21 U/L (ref 15–41)
Albumin: 4.1 g/dL (ref 3.5–5.0)
Alkaline Phosphatase: 75 U/L (ref 38–126)
Anion gap: 13 (ref 5–15)
BUN: 18 mg/dL (ref 6–20)
CO2: 27 mmol/L (ref 22–32)
Calcium: 9.9 mg/dL (ref 8.9–10.3)
Chloride: 94 mmol/L — ABNORMAL LOW (ref 98–111)
Creatinine: 0.83 mg/dL (ref 0.44–1.00)
GFR, Estimated: >60 mL/min (ref >60)
Glucose, Bld: 157 mg/dL — ABNORMAL HIGH (ref 70–99)
Potassium: 4.3 mmol/L (ref 3.5–5.1)
Sodium: 134 mmol/L — ABNORMAL LOW (ref 135–145)
Total Bilirubin: 0.6 mg/dL (ref 0.3–1.2)
Total Protein: 7.5 g/dL (ref 6.5–8.1)

## 2020-05-18 MED ORDER — SODIUM CHLORIDE 0.9 % IV SOLN
Freq: Once | INTRAVENOUS | Status: AC
  Filled 2020-05-18: qty 250

## 2020-05-18 NOTE — Telephone Encounter (Signed)
[place] called and said she had nausea, vomiting and diarrhea that started on Friday and lasted until Saturday.  She did take Zofran 3 times and Imodium 4 times.  She went to the ER in [place] on Saturday but there was a long wait.  She hasn't been able to eat or drink very much.  She has only had 4-5 saltines, some jelllo and 2 16 oz bottles of ginger ale. The vomiting and diarrhea have stopped but she is having abdominal cramps. She feels dehydrated and said her lips are purple.  She is wondering if Dr. Alvy Bimler would want to see her before or after her radiation appointment or if she should go to the ER here.

## 2020-05-18 NOTE — Telephone Encounter (Signed)
I cannot add her Ask for [facility]

## 2020-05-18 NOTE — Progress Notes (Signed)
Pt ambulatory to radiation appt w/belongings and IVF which will be d/c by rad RNs at end of infusion.

## 2020-05-18 NOTE — Telephone Encounter (Signed)
Called Lizzete back and advised her of 1:00 lab appointment (she is ok with peripheral stick) and 1:15 symptom management appointment with Sandi Mealy, PA.  She verbalized understanding and agreement.

## 2020-05-18 NOTE — Patient Instructions (Signed)

## 2020-05-19 ENCOUNTER — Other Ambulatory Visit: Payer: Self-pay

## 2020-05-19 ENCOUNTER — Other Ambulatory Visit: Payer: Self-pay | Admitting: Medical

## 2020-05-19 ENCOUNTER — Telehealth: Payer: Self-pay | Admitting: Emergency Medicine

## 2020-05-19 ENCOUNTER — Telehealth: Payer: Self-pay | Admitting: Oncology

## 2020-05-19 ENCOUNTER — Inpatient Hospital Stay (HOSPITAL_BASED_OUTPATIENT_CLINIC_OR_DEPARTMENT_OTHER): Payer: Medicare Other | Admitting: Medical

## 2020-05-19 ENCOUNTER — Ambulatory Visit (HOSPITAL_COMMUNITY)
Admission: RE | Admit: 2020-05-19 | Discharge: 2020-05-19 | Disposition: A | Discharge status: Home / Self Care | Payer: Medicare Other | Source: Ambulatory Visit | Attending: Medical | Admitting: Medical

## 2020-05-19 ENCOUNTER — Ambulatory Visit
Admission: RE | Admit: 2020-05-19 | Discharge: 2020-05-19 | Disposition: A | Discharge status: Home / Self Care | Payer: Medicare Other | Source: Ambulatory Visit | Attending: Radiation Oncology | Admitting: Radiation Oncology

## 2020-05-19 VITALS — BP 141/84 | HR 71 | Temp 97.7°F | Resp 18 | Ht 69.0 in | Wt 193.8 lb

## 2020-05-19 DIAGNOSIS — E860 Dehydration: Secondary | ICD-10-CM | POA: Diagnosis not present

## 2020-05-19 DIAGNOSIS — R1084 Generalized abdominal pain: Secondary | ICD-10-CM

## 2020-05-19 DIAGNOSIS — Z5111 Encounter for antineoplastic chemotherapy: Secondary | ICD-10-CM | POA: Diagnosis not present

## 2020-05-19 DIAGNOSIS — C569 Malignant neoplasm of unspecified ovary: Secondary | ICD-10-CM | POA: Diagnosis not present

## 2020-05-19 DIAGNOSIS — R252 Cramp and spasm: Secondary | ICD-10-CM | POA: Diagnosis not present

## 2020-05-19 DIAGNOSIS — Z510 Encounter for antineoplastic radiation therapy: Secondary | ICD-10-CM | POA: Diagnosis not present

## 2020-05-19 DIAGNOSIS — R140 Abdominal distension (gaseous): Secondary | ICD-10-CM | POA: Diagnosis present

## 2020-05-19 DIAGNOSIS — Z95828 Presence of other vascular implants and grafts: Secondary | ICD-10-CM

## 2020-05-19 MED ORDER — HEPARIN SOD (PORK) LOCK FLUSH 100 UNIT/ML IV SOLN
500.0000 [IU] | Freq: Once | INTRAVENOUS | Status: AC
  Administered 2020-05-19: 500 [IU]
  Filled 2020-05-19: qty 5

## 2020-05-19 MED ORDER — SODIUM CHLORIDE 0.9% FLUSH
10.0000 mL | Freq: Once | INTRAVENOUS | Status: AC
  Administered 2020-05-19: 10 mL
  Filled 2020-05-19: qty 10

## 2020-05-19 MED ORDER — SODIUM CHLORIDE 0.9 % IV SOLN
Freq: Once | INTRAVENOUS | Status: AC
  Filled 2020-05-19: qty 250

## 2020-05-19 NOTE — Telephone Encounter (Signed)
Received call from Nurse [place] on behalf of pt who is reporting slightly improved dehydration but no change in abd pain.  Pt requesting more IVF and abd scan, ok per PA [name].  Pt agreed to be here at 1:30 today for abd scan and 2pm for IVF in [facility].  Appt made, orders placed.

## 2020-05-19 NOTE — Patient Instructions (Signed)

## 2020-05-19 NOTE — Telephone Encounter (Signed)
Heidi Johnson called and said she is feeling a little better today after fluids yesterday.  Her skin is looking more like normal.  She said her stomach is still hurting and that [name] had recommended doing an abdominal x-ray and more fluids today if needed.  [name], RN with the [facility] and [name] should go to radiology at 1:30 today for the x-ray and then return to see Symptom Management regarding fluids.  Called [name] and advised her of appointments for radiology and symptom management.  She verbalized understanding and agreement.

## 2020-05-19 NOTE — Progress Notes (Signed)
Symptoms Management Clinic Progress Note   Heidi Johnson [id number] 04-22-1971 49 y.o.  Heidi Johnson is managed by Dr. [name]  Actively treated with chemotherapy/immunotherapy/hormonal therapy: Currently on a chemotherapy break while receiving radiation therapy to the left hip.  Next scheduled appointment with provider: 06/08/2020  Assessment: Plan:    Other specified hypotension - Plan: 0.9 %  sodium chloride infusion  Peritoneal carcinomatosis ([place])  Malignant neoplasm of ovary, unspecified laterality (HCC)  Malignant bone pain   Orthostatic hypotension: The patient was given 1 L of normal saline today.  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 is currently on a break from chemotherapy while she is receiving radiation therapy to her left hip.  She is scheduled to follow-up with Dr. [name] on 06/08/2020 for repeat imaging and possible chemotherapy.  Please see After Visit Summary for patient specific instructions.  Future Appointments  Date Time Provider [place]  05/19/2020  3:00 PM [facility] LINAC 4 [facility] None  05/20/2020  3:00 PM [facility] [id number] [facility] None  06/08/2020  9:30 AM [facility] LAB [facility] None  06/08/2020  9:45 AM [facility] [place] FLUSH [facility] None  06/08/2020 10:20 AM [name], MD [facility] None  06/22/2020  3:30 PM Gery Pray, MD [facility] None    No orders of the defined types were placed in this encounter.      Subjective:   Patient ID:  Heidi Johnson is a 49 y.o. (DOB Dec 05, 1970) female.  Chief Complaint:  Chief Complaint  Patient presents with  . Anorexia  . Fatigue    HPI Heidi Johnson  is a 49 y.o. female with a diagnosis of a metastatic malignant neoplasm of the ovary with peritoneal carcinomatosis and left hip lesion.  She continues to be followed by Dr. [name] and is currently on a break from  chemotherapy while she ireceiving radiation therapy to her left hip.   She presents to the clinic today with a report of anorexia fatigue and possible dehydration.  She recently had nausea, vomiting, and diarrhea.  She additionally has been having hand cramps.  She has been using Flexeril for her muscle cramps without benefit.  She has only been eating crackers, ginger ale and Jell-O but has had no vomiting since Sunday and has had no diarrhea since that time 2.  She has been taking Zofran but has had none since Sunday and was also taking Imodium.  She had gone to the emergency room this weekend but elected not to stay due to the long waiting time.    Medications: I have reviewed the patient's current medications.  Allergies:  Allergies  Allergen Reactions  . Latex Rash and Hives  . Sulfamethoxazole-Trimethoprim Other (See Comments)    Reaction:  Unknown  Other reaction(s): Unknown When 27, dr told her not to take    Past Medical History:  Diagnosis Date  . Cancer ([place])    ovarian cancer  . CHF (congestive heart failure) ([place])   . Hypertension   . Panic attacks   . Stroke [facility])     Past Surgical History:  Procedure Laterality Date  . ABDOMINAL HYSTERECTOMY    . BACK SURGERY    . TUBAL LIGATION      Family History  Problem Relation Age of Onset  . Cancer Maternal Aunt        breast ca/GYN    Social History   Socioeconomic History  . Marital status: Divorced  Spouse name: Not on file  . Number of children: 3  . Years of education: Not on file  . Highest education level: Not on file  Occupational History  . Occupation: retired  Tobacco Use  . Smoking status: Former Smoker    Types: Cigarettes    Quit date: 06/20/2018    Years since quitting: 1.9  . Smokeless tobacco: Never Used  Substance and Sexual Activity  . Alcohol use: No  . Drug use: Yes    Types: Marijuana  . Sexual activity: Yes  Other Topics Concern  . Not on file  Social History Narrative   Lives  alone   Social Determinants of Health   Financial Resource Strain:   . Difficulty of Paying Living Expenses: Not on file  Food Insecurity:   . Worried About Charity fundraiser in the Last Year: Not on file  . Ran Out of Food in the Last Year: Not on file  Transportation Needs:   . Lack of Transportation (Medical): Not on file  . Lack of Transportation (Non-Medical): Not on file  Physical Activity:   . Days of Exercise per Week: Not on file  . Minutes of Exercise per Session: Not on file  Stress:   . Feeling of Stress : Not on file  Social Connections:   . Frequency of Communication with Friends and Family: Not on file  . Frequency of Social Gatherings with Friends and Family: Not on file  . Attends Religious Services: Not on file  . Active Member of Clubs or Organizations: Not on file  . Attends Archivist Meetings: Not on file  . Marital Status: Not on file  Intimate Partner Violence:   . Fear of Current or Ex-Partner: Not on file  . Emotionally Abused: Not on file  . Physically Abused: Not on file  . Sexually Abused: Not on file    Past Medical History, Surgical history, Social history, and Family history were reviewed and updated as appropriate.   Please see review of systems for further details on the patient's review from today.   Review of Systems:  Review of Systems  Constitutional: Positive for appetite change and fatigue. Negative for chills, diaphoresis and fever.  HENT: Negative for trouble swallowing and voice change.   Respiratory: Negative for cough, chest tightness, shortness of breath and wheezing.   Cardiovascular: Negative for chest pain and palpitations.  Gastrointestinal: Negative for abdominal pain, constipation, diarrhea, nausea and vomiting.  Musculoskeletal: Negative for back pain and myalgias.       Muscle cramps despite taking Flexeril.  Neurological: Negative for dizziness, light-headedness and headaches.    Objective:   Physical  Exam:  BP 124/83 (BP Location: Left Arm, Patient Position: Sitting)   Pulse 86   Temp (!) 97 F (36.1 C) (Tympanic)   Resp 18   Ht 5\' 9"  (1.753 m)   Wt 192 lb 4.8 oz (87.2 kg)   SpO2 98%   BMI 28.40 kg/m  ECOG: 1  Orthostatic Blood Pressure: Blood pressure:   sitting 145/94, standing 112/75 Pulse:   sitting 72, standing 96   Physical Exam Constitutional:      General: She is not in acute distress.    Appearance: She is not diaphoretic.  HENT:     Head: Normocephalic and atraumatic.  Eyes:     General: No scleral icterus.       Right eye: No discharge.        Left eye: No discharge.  Conjunctiva/sclera: Conjunctivae normal.  Cardiovascular:     Rate and Rhythm: Normal rate and regular rhythm.     Heart sounds: Normal heart sounds. No murmur heard.  No friction rub. No gallop.   Pulmonary:     Effort: Pulmonary effort is normal. No respiratory distress.     Breath sounds: Normal breath sounds. No wheezing or rales.  Abdominal:     General: Abdomen is protuberant. Bowel sounds are normal. There is no distension or abdominal bruit.    Skin:    General: Skin is warm and dry.     Findings: No erythema or rash.  Neurological:     Mental Status: She is alert.     Coordination: Coordination normal.     Gait: Gait normal.     Lab Review:     Component Value Date/Time   NA 134 (L) 05/18/2020 1313   K 4.3 05/18/2020 1313   CL 94 (L) 05/18/2020 1313   CO2 27 05/18/2020 1313   GLUCOSE 157 (H) 05/18/2020 1313   BUN 18 05/18/2020 1313   CREATININE 0.83 05/18/2020 1313   CALCIUM 9.9 05/18/2020 1313   PROT 7.5 05/18/2020 1313   ALBUMIN 4.1 05/18/2020 1313   AST 21 05/18/2020 1313   ALT 33 05/18/2020 1313   ALKPHOS 75 05/18/2020 1313   BILITOT 0.6 05/18/2020 1313   GFRNONAA >60 05/18/2020 1313   GFRAA >60 03/09/2020 1005   GFRAA >60 01/09/2020 1404       Component Value Date/Time   WBC 13.4 (H) 05/18/2020 1313   WBC 9.8 04/03/2020 0145   RBC 5.54 (H)  05/18/2020 1313   HGB 16.2 (H) 05/18/2020 1313   HCT 49.1 (H) 05/18/2020 1313   PLT 205 05/18/2020 1313   MCV 88.6 05/18/2020 1313   MCH 29.2 05/18/2020 1313   MCHC 33.0 05/18/2020 1313   RDW 17.1 (H) 05/18/2020 1313   LYMPHSABS 0.9 05/18/2020 1313   MONOABS 0.4 05/18/2020 1313   EOSABS 0.0 05/18/2020 1313   BASOSABS 0.0 05/18/2020 1313   -------------------------------  Imaging from last 24 hours (if applicable):  Radiology interpretation: MR HIP LEFT W WO CONTRAST  Result Date: 04/24/2020 CLINICAL DATA:  Acute left hip pain. EXAM: MRI OF THE LEFT HIP WITHOUT AND WITH CONTRAST TECHNIQUE: Multiplanar, multisequence MR imaging was performed both before and after administration of intravenous contrast. CONTRAST:  68mL GADAVIST GADOBUTROL 1 MMOL/ML IV SOLN COMPARISON:  None. FINDINGS: Bones: No hip fracture, dislocation or avascular necrosis. 18 mm T1 hypointense and T2 hyperintense bone lesion in the peripheral superior aspect of the left greater trochanter with enhancement on postcontrast imaging. 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 No SI joint widening or erosive changes. Degenerative disease with disc height loss at L4-5 and L5-S1 with bilateral facet arthropathy. Articular cartilage and labrum Articular cartilage:  No chondral defect. Labrum: Grossly intact, but evaluation is limited by lack of intraarticular fluid. Joint or bursal effusion Joint effusion:  No hip joint effusion.  No SI joint effusion. Bursae:  No bursa formation. Muscles and tendons Flexors: Normal. Extensors: Normal. Abductors: Normal. Adductors: Normal. Gluteals: Normal. Hamstrings: Normal. Other findings No pelvic free fluid. No fluid collection or hematoma. No inguinal lymphadenopathy. No inguinal hernia. Diverticulosis of the [place] diverticulitis. IMPRESSION: 1. No hip fracture, dislocation or avascular necrosis. 2. A 18 mm bone lesion in the peripheral superior aspect of the left  greater trochanter with enhancement on postcontrast imaging. The overall appearance is most concerning  for metastatic disease in the setting of known malignancy. 3. 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 4. Degenerative disease with disc height loss at L4-5 and L5-S1 with bilateral facet arthropathy. Electronically Signed   By: Kathreen Devoid   On: 04/24/2020 09:03

## 2020-05-19 NOTE — Progress Notes (Signed)
Symptoms Management Clinic Progress Note   Heidi Johnson [id number] 03-04-1971 49 y.o.  Heidi Johnson is managed by Heidi Johnson  Actively treated with chemotherapy/immunotherapy/hormonal therapy: Currently on a chemotherapy break while receiving radiation therapy to the left hip.  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  She is scheduled to follow-up with Heidi Johnson on 06/08/2020 for repeat imaging and possible chemotherapy.  Dehydration: The patient was given 1 L of normal saline today.  Muscle cramps of the hand: The patient continues to use Flexeril.  She was also told to begin a trial of tonic water nightly as the quinine and the tonic water may be beneficial.  Abdominal pain: The patient was referred for an abdominal x-ray today which returned showing:  FINDINGS: The bowel gas pattern is normal.  There is no evidence of free air. Phleboliths are noted in the pelvis.  IMPRESSION: No evidence of bowel obstruction or ileus.  Please see After Visit Summary for patient specific instructions.  Future Appointments  Date Time Provider [place]  05/20/2020  3:00 PM [facility] [id number] [facility] None  06/08/2020  9:30 AM [facility] LAB [facility] None  06/08/2020  9:45 AM [facility] [place] FLUSH [facility] None  06/08/2020 10:20 AM Heidi Lark, Johnson [facility] None  06/22/2020  3:30 PM Heidi Johnson [facility] None    No orders of the  defined types were placed in this encounter.      Subjective:   Patient ID:  Heidi Johnson is a 49 y.o. (DOB August 30, 1970) female.  Chief Complaint:  Chief Complaint  Patient presents with  . Dehydration    HPI Heidi Johnson  is a 49 y.o. female with a diagnosis of a metastatic malignant neoplasm of the ovary with peritoneal carcinomatosis and left hip lesion.  She continues to be followed by Heidi Johnson and is currently on a break from chemotherapy while she ireceiving radiation therapy to her left hip.  She continues to have hand cramping which she reports was mildly better after receiving IV fluids yesterday.   She presents to the clinic today for additional IV hydration.  She was given 1 L of normal saline yesterday and reported that she was feeling significantly better.  She was referred for an abdominal x-ray today due to her ongoing abdominal pain with the results as follows:  FINDINGS: The bowel gas pattern is normal.  There is no evidence of free air. Phleboliths are noted in the pelvis.  IMPRESSION: No evidence of bowel obstruction or ileus.    Medications: I have reviewed the patient's current medications.  Allergies:  Allergies  Allergen Reactions  . Latex Rash and Hives  . Sulfamethoxazole-Trimethoprim Other (See Comments)    Reaction:  Unknown  Other reaction(s): Unknown When 37, dr told her not to take    Past Medical History:  Diagnosis Date  . Cancer ([place])    ovarian cancer  . CHF (congestive heart failure) ([place])   . Hypertension   . Panic attacks   .  Stroke [facility])     Past Surgical History:  Procedure Laterality Date  . ABDOMINAL HYSTERECTOMY    . BACK SURGERY    . TUBAL LIGATION      Family History  Problem Relation Age of Onset  . Cancer Maternal Aunt        breast ca/GYN    Social History   Socioeconomic History  . Marital status: Divorced    Spouse name: Not on file  . Number of children: 3  . Years of education: Not  on file  . Highest education level: Not on file  Occupational History  . Occupation: retired  Tobacco Use  . Smoking status: Former Smoker    Types: Cigarettes    Quit date: 06/20/2018    Years since quitting: 1.9  . Smokeless tobacco: Never Used  Substance and Sexual Activity  . Alcohol use: No  . Drug use: Yes    Types: Marijuana  . Sexual activity: Yes  Other Topics Concern  . Not on file  Social History Narrative   Lives alone   Social Determinants of Health   Financial Resource Strain:   . Difficulty of Paying Living Expenses: Not on file  Food Insecurity:   . Worried About Charity fundraiser in the Last Year: Not on file  . Ran Out of Food in the Last Year: Not on file  Transportation Needs:   . Lack of Transportation (Medical): Not on file  . Lack of Transportation (Non-Medical): Not on file  Physical Activity:   . Days of Exercise per Week: Not on file  . Minutes of Exercise per Session: Not on file  Stress:   . Feeling of Stress : Not on file  Social Connections:   . Frequency of Communication with Friends and Family: Not on file  . Frequency of Social Gatherings with Friends and Family: Not on file  . Attends Religious Services: Not on file  . Active Member of Clubs or Organizations: Not on file  . Attends Archivist Meetings: Not on file  . Marital Status: Not on file  Intimate Partner Violence:   . Fear of Current or Ex-Partner: Not on file  . Emotionally Abused: Not on file  . Physically Abused: Not on file  . Sexually Abused: Not on file    Past Medical History, Surgical history, Social history, and Family history were reviewed and updated as appropriate.   Please see review of systems for further details on the patient's review from today.   Review of Systems:  Review of Systems  Constitutional: Positive for appetite change and fatigue. Negative for chills, diaphoresis and fever.  HENT: Negative for trouble swallowing and voice change.    Respiratory: Negative for cough, chest tightness, shortness of breath and wheezing.   Cardiovascular: Negative for chest pain and palpitations.  Gastrointestinal: Positive for abdominal pain. Negative for constipation, diarrhea, nausea and vomiting.  Musculoskeletal: Negative for back pain and myalgias.       Muscle cramps despite taking Flexeril.  Neurological: Negative for dizziness, light-headedness and headaches.    Objective:   Physical Exam:  BP (!) 141/84 (BP Location: Left Arm, Patient Position: Sitting)   Pulse 71   Temp 97.7 F (36.5 C) (Tympanic)   Resp 18   Ht 5\' 9"  (1.753 m)   Wt 193 lb 12.8 oz (87.9 kg)   SpO2 100%   BMI 28.62 kg/m  ECOG: 1  Orthostatic Blood Pressure: Blood pressure:   sitting 145/94, standing  112/75 Pulse:   sitting 72, standing 96   Physical Exam Constitutional:      General: She is not in acute distress.    Appearance: She is not ill-appearing.  Eyes:     General: No scleral icterus.       Right eye: No discharge.        Left eye: No discharge.     Conjunctiva/sclera: Conjunctivae normal.  Neurological:     Mental Status: She is alert.     Coordination: Coordination normal.     Gait: Gait normal.     Lab Review:     Component Value Date/Time   NA 134 (L) 05/18/2020 1313   K 4.3 05/18/2020 1313   CL 94 (L) 05/18/2020 1313   CO2 27 05/18/2020 1313   GLUCOSE 157 (H) 05/18/2020 1313   BUN 18 05/18/2020 1313   CREATININE 0.83 05/18/2020 1313   CALCIUM 9.9 05/18/2020 1313   PROT 7.5 05/18/2020 1313   ALBUMIN 4.1 05/18/2020 1313   AST 21 05/18/2020 1313   ALT 33 05/18/2020 1313   ALKPHOS 75 05/18/2020 1313   BILITOT 0.6 05/18/2020 1313   GFRNONAA >60 05/18/2020 1313   GFRAA >60 03/09/2020 1005   GFRAA >60 01/09/2020 1404       Component Value Date/Time   WBC 13.4 (H) 05/18/2020 1313   WBC 9.8 04/03/2020 0145   RBC 5.54 (H) 05/18/2020 1313   HGB 16.2 (H) 05/18/2020 1313   HCT 49.1 (H) 05/18/2020 1313   PLT 205  05/18/2020 1313   MCV 88.6 05/18/2020 1313   MCH 29.2 05/18/2020 1313   MCHC 33.0 05/18/2020 1313   RDW 17.1 (H) 05/18/2020 1313   LYMPHSABS 0.9 05/18/2020 1313   MONOABS 0.4 05/18/2020 1313   EOSABS 0.0 05/18/2020 1313   BASOSABS 0.0 05/18/2020 1313   -------------------------------  Imaging from last 24 hours (if applicable):  Radiology interpretation: MR HIP LEFT W WO CONTRAST  Result Date: 04/24/2020 CLINICAL DATA:  Acute left hip pain. EXAM: MRI OF THE LEFT HIP WITHOUT AND WITH CONTRAST TECHNIQUE: Multiplanar, multisequence MR imaging was performed both before and after administration of intravenous contrast. CONTRAST:  40mL GADAVIST GADOBUTROL 1 MMOL/ML IV SOLN COMPARISON:  None. FINDINGS: Bones: No hip fracture, dislocation or avascular necrosis. 18 mm T1 hypointense and T2 hyperintense bone lesion in the peripheral superior aspect of the left greater trochanter with enhancement on postcontrast imaging. 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 No SI joint widening or erosive changes. Degenerative disease with disc height loss at L4-5 and L5-S1 with bilateral facet arthropathy. Articular cartilage and labrum Articular cartilage:  No chondral defect. Labrum: Grossly intact, but evaluation is limited by lack of intraarticular fluid. Joint or bursal effusion Joint effusion:  No hip joint effusion.  No SI joint effusion. Bursae:  No bursa formation. Muscles and tendons Flexors: Normal. Extensors: Normal. Abductors: Normal. Adductors: Normal. Gluteals: Normal. Hamstrings: Normal. Other findings No pelvic free fluid. No fluid collection or hematoma. No inguinal lymphadenopathy. No inguinal hernia. Diverticulosis of the [place] diverticulitis. IMPRESSION: 1. No hip fracture, dislocation or avascular necrosis. 2. A 18 mm bone lesion in the peripheral superior aspect of the left greater trochanter with enhancement on postcontrast imaging. The overall appearance is most  concerning for metastatic disease in the setting of known malignancy. 3. 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 4. Degenerative disease with disc height loss at L4-5 and L5-S1 with bilateral facet arthropathy. Electronically Signed  By: Kathreen Devoid   On: 04/24/2020 09:03   DG Abd 2 Views  Result Date: 05/19/2020 CLINICAL DATA:  Abdominal pain and distention. History of ovarian cancer. EXAM: ABDOMEN - 2 VIEW COMPARISON:  None. FINDINGS: The bowel gas pattern is normal. There is no evidence of free air. Phleboliths are noted in the pelvis. IMPRESSION: No evidence of bowel obstruction or ileus. Electronically Signed   By: Marijo Conception M.D.   On: 05/19/2020 12:58

## 2020-05-20 ENCOUNTER — Ambulatory Visit: Payer: Medicare Other

## 2020-05-20 ENCOUNTER — Ambulatory Visit
Admission: RE | Admit: 2020-05-20 | Discharge: 2020-05-20 | Disposition: A | Discharge status: Home / Self Care | Payer: Medicare Other | Source: Ambulatory Visit | Attending: Radiation Oncology | Admitting: Radiation Oncology

## 2020-05-20 ENCOUNTER — Encounter: Payer: Self-pay | Admitting: Radiation Oncology

## 2020-05-20 ENCOUNTER — Other Ambulatory Visit: Payer: Self-pay

## 2020-05-20 DIAGNOSIS — C7951 Secondary malignant neoplasm of bone: Secondary | ICD-10-CM | POA: Diagnosis not present

## 2020-05-20 DIAGNOSIS — C569 Malignant neoplasm of unspecified ovary: Secondary | ICD-10-CM | POA: Diagnosis not present

## 2020-05-20 DIAGNOSIS — Z510 Encounter for antineoplastic radiation therapy: Secondary | ICD-10-CM | POA: Diagnosis present

## 2020-05-21 ENCOUNTER — Ambulatory Visit: Payer: Medicare Other

## 2020-05-29 ENCOUNTER — Telehealth: Payer: Self-pay

## 2020-05-29 ENCOUNTER — Other Ambulatory Visit: Payer: Self-pay | Admitting: Hematology and Oncology

## 2020-05-29 NOTE — Telephone Encounter (Signed)
She called and left a message. Requesting refill on Dilaudid Rx. She ask that Rx be sent to [facility] in [place], [place]. She has enough for the weekend but will refill Monday.

## 2020-05-29 NOTE — Telephone Encounter (Signed)
Called and given below. She verbalized understanding.

## 2020-05-29 NOTE — Telephone Encounter (Signed)
Based on recent refill on 11/22, she is not due I will refill it Monday morning

## 2020-06-01 ENCOUNTER — Other Ambulatory Visit: Payer: Self-pay | Admitting: Hematology and Oncology

## 2020-06-01 MED ORDER — DILAUDID 2 MG PO TABS: 2.0000 mg | ORAL_TABLET | Freq: Four times a day (QID) | ORAL | 0 refills | Status: DC | PRN

## 2020-06-08 ENCOUNTER — Encounter: Payer: Self-pay | Admitting: Hematology and Oncology

## 2020-06-08 ENCOUNTER — Inpatient Hospital Stay: Payer: Medicare Other

## 2020-06-08 ENCOUNTER — Other Ambulatory Visit: Payer: Self-pay

## 2020-06-08 ENCOUNTER — Inpatient Hospital Stay: Payer: Medicare Other | Attending: Hematology and Oncology | Admitting: Hematology and Oncology

## 2020-06-08 ENCOUNTER — Other Ambulatory Visit: Payer: Self-pay | Admitting: Hematology and Oncology

## 2020-06-08 VITALS — BP 137/86 | HR 70 | Temp 97.1°F | Resp 18 | Ht 69.0 in | Wt 195.8 lb

## 2020-06-08 DIAGNOSIS — G620 Drug-induced polyneuropathy: Secondary | ICD-10-CM | POA: Diagnosis not present

## 2020-06-08 DIAGNOSIS — T451X5A Adverse effect of antineoplastic and immunosuppressive drugs, initial encounter: Secondary | ICD-10-CM

## 2020-06-08 DIAGNOSIS — C569 Malignant neoplasm of unspecified ovary: Secondary | ICD-10-CM | POA: Insufficient documentation

## 2020-06-08 DIAGNOSIS — I714 Abdominal aortic aneurysm, without rupture, unspecified: Secondary | ICD-10-CM

## 2020-06-08 DIAGNOSIS — G893 Neoplasm related pain (acute) (chronic): Secondary | ICD-10-CM

## 2020-06-08 DIAGNOSIS — M898X9 Other specified disorders of bone, unspecified site: Secondary | ICD-10-CM

## 2020-06-08 DIAGNOSIS — Z95828 Presence of other vascular implants and grafts: Secondary | ICD-10-CM

## 2020-06-08 DIAGNOSIS — Z604 Social exclusion and rejection: Secondary | ICD-10-CM

## 2020-06-08 LAB — CBC WITH DIFFERENTIAL/PLATELET
Abs Immature Granulocytes: 0.2 10*3/uL — ABNORMAL HIGH (ref 0.00–0.07)
Basophils Absolute: 0.1 10*3/uL (ref 0.0–0.1)
Basophils Relative: 0 %
Eosinophils Absolute: 0 10*3/uL (ref 0.0–0.5)
Eosinophils Relative: 0 %
HCT: 42.7 % (ref 36.0–46.0)
Hemoglobin: 14 g/dL (ref 12.0–15.0)
Immature Granulocytes: 2 %
Lymphocytes Relative: 9 %
Lymphs Abs: 1.1 10*3/uL (ref 0.7–4.0)
MCH: 29.5 pg (ref 26.0–34.0)
MCHC: 32.8 g/dL (ref 30.0–36.0)
MCV: 90.1 fL (ref 80.0–100.0)
Monocytes Absolute: 0.6 10*3/uL (ref 0.1–1.0)
Monocytes Relative: 5 %
Neutro Abs: 9.8 10*3/uL — ABNORMAL HIGH (ref 1.7–7.7)
Neutrophils Relative %: 84 %
Platelets: 189 10*3/uL (ref 150–400)
RBC: 4.74 MIL/uL (ref 3.87–5.11)
RDW: 17.9 % — ABNORMAL HIGH (ref 11.5–15.5)
WBC: 11.7 10*3/uL — ABNORMAL HIGH (ref 4.0–10.5)
nRBC: 0 % (ref 0.0–0.2)

## 2020-06-08 LAB — COMPREHENSIVE METABOLIC PANEL
ALT: 27 U/L (ref 0–44)
AST: 16 U/L (ref 15–41)
Albumin: 3.7 g/dL (ref 3.5–5.0)
Alkaline Phosphatase: 70 U/L (ref 38–126)
Anion gap: 12 (ref 5–15)
BUN: 22 mg/dL — ABNORMAL HIGH (ref 6–20)
CO2: 21 mmol/L — ABNORMAL LOW (ref 22–32)
Calcium: 9.3 mg/dL (ref 8.9–10.3)
Chloride: 102 mmol/L (ref 98–111)
Creatinine, Ser: 1 mg/dL (ref 0.44–1.00)
GFR, Estimated: >60 mL/min (ref >60)
Glucose, Bld: 166 mg/dL — ABNORMAL HIGH (ref 70–99)
Potassium: 4.1 mmol/L (ref 3.5–5.1)
Sodium: 135 mmol/L (ref 135–145)
Total Bilirubin: 0.3 mg/dL (ref 0.3–1.2)
Total Protein: 6.6 g/dL (ref 6.5–8.1)

## 2020-06-08 MED ORDER — SODIUM CHLORIDE 0.9% FLUSH
10.0000 mL | INTRAVENOUS | Status: AC | PRN
  Administered 2020-06-08: 10 mL
  Filled 2020-06-08: qty 10

## 2020-06-08 MED ORDER — HEPARIN SOD (PORK) LOCK FLUSH 100 UNIT/ML IV SOLN
500.0000 [IU] | INTRAVENOUS | Status: AC | PRN
  Administered 2020-06-08: 500 [IU]
  Filled 2020-06-08: qty 5

## 2020-06-08 MED ORDER — MORPHINE SULFATE ER 30 MG PO TBCR: 30.0000 mg | EXTENDED_RELEASE_TABLET | Freq: Three times a day (TID) | ORAL | 0 refills | Status: DC

## 2020-06-08 MED FILL — MORPHINE SULF ER 30 MG TAB: 30 | 30 days supply | Qty: 90 | Fill #0

## 2020-06-08 NOTE — Patient Instructions (Signed)

## 2020-06-08 NOTE — Assessment & Plan Note (Signed)
She has very poor social situation She stated multiple times that her daughter does not care about her and she has to be at the point of being on the floor before somebody will take notice of her She has significant difficulties traveling back and forth to get help I will get social worker to identify resources and see if it is feasible for her to relocate to [facility] for future care

## 2020-06-08 NOTE — Progress Notes (Signed)
[place] OFFICE PROGRESS NOTE  Patient Care Team: Pcp, No as PCP - General  ASSESSMENT & PLAN:  Ovarian cancer ([place]) She has experienced multiple side effects since completion of radiation therapy I plan to order CT imaging next month for objective assessment of response to therapy She would likely need to go back on chemotherapy Given severe peripheral neuropathy, I might consider carboplatin with either gemcitabine or doxorubicin as treatment options for her I am concerned about her poor social circumstances and will consult social worker for assistance She also benefit from physical therapy and rehab given her weakness  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 She will continue pain medicine and gabapentin She is weak and had multiple recent falls I will consult physical therapy and rehab  Malignant bone pain She continues to have severe pain despite recent escalation of pain medicine For now, we will continue current prescribed dose of narcotics I refill her prescription of morphine today  Social isolation She has very poor social situation She stated multiple times that her daughter does not care about her and she has to be at the point of being on the floor before somebody will take notice of her She has significant difficulties traveling back and forth to get help I will get social worker to identify resources and see if it is feasible for her to relocate to [facility] for future care   Orders Placed This Encounter  Procedures  . CT ABDOMEN PELVIS W CONTRAST    Standing Status:   Future    Standing Expiration Date:   06/08/2021    Order Specific Question:   If indicated for the ordered procedure, I authorize the administration of contrast media per Radiology protocol    Answer:   Yes    Order Specific Question:   Preferred imaging location?    Answer:   [facility]    Order  Specific Question:   Radiology Contrast Protocol - do NOT remove file path    Answer:   [email or web address]    Order Specific Question:   Is patient pregnant?    Answer:   No  . Ambulatory referral to Physical Therapy    Referral Priority:   Routine    Referral Type:   Physical Medicine    Referral Reason:   Specialty Services Required    Requested Specialty:   Physical Therapy    Number of Visits Requested:   1  . Ambulatory referral to Social Work    Referral Priority:   Routine    Referral Type:   Consultation    Referral Reason:   Specialty Services Required    Number of Visits Requested:   1    All questions were answered. The patient knows to call the clinic with any problems, questions or concerns. The total time spent in the appointment was 30 minutes encounter with patients including review of chart and various tests results, discussions about plan of care and coordination of care plan   [name], MD 06/08/2020 2:45 PM  INTERVAL HISTORY: Please see below for problem oriented charting. She returns for further follow-up She has completed radiation therapy recently She is very tearful today She had fallen multiple times but did not sustain major injury She continues to have severe pain and peripheral neuropathy She has very poor social circumstances She was not able to get hold of her daughter; according to the patient, she felt isolated from  family She has difficulties traveling back and forth to get her treatment and follow-up SUMMARY OF ONCOLOGIC HISTORY: Oncology History Overview Note  Negative genetics at [facility]   Ovarian cancer ([place])  04/25/2016 Imaging   CT confirms 12cm ovarian masses with extensive peritoneal metastases and malignant pleural effusions   05/05/2016 Surgery   s/p ex-lap posterior exenteration, diaphragm stripping, cholecystectomy, omentectomy, appendectomy and optimal tumor debulking on 05/05/16   06/24/2016 -  10/24/2016 Chemotherapy   Started Taxol/Carboplatin. 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  Numerous left predominant calcified pleural metastases and associated small left pleural effusion are unchanged.  2.  Unchanged (3.0 cm) (series 3, image 96) left breast mass    02/07/2019 Imaging   Outside Ct abdomen and pelvis 1. Overall stable small volume peritoneal tumor, as described above.  2. Unchanged infrarenal abdominal aortic aneurysm.    02/22/2019 Tumor Marker   Patient's tumor was tested for the following markers: CA-125 Results of the tumor marker test revealed 164   03/18/2019 Tumor Marker   Patient's tumor was tested for the following markers: CA-125 Results of the tumor marker test revealed 162   04/08/2019 Tumor Marker   Patient's tumor was tested for the following markers: CA-125 Results of the tumor marker test revealed 164   04/29/2019 Tumor Marker   Patient's tumor was tested for the following markers: CA-125 Results of the tumor marker test revealed 181   05/20/2019 Tumor Marker   Patient's tumor was tested for the following markers: CA-125 Results of the tumor marker test revealed 164   06/10/2019 Tumor Marker   Patient's tumor was tested for the following markers: CA-125 Results of the tumor marker test revealed 152   07/01/2019 Tumor Marker   Patient's tumor was tested for the following markers: CA-125 Results of the tumor marker test revealed 160   07/22/2019 Tumor Marker   Patient's tumor was tested for the following markers: CA-125 Results of  the tumor marker test revealed 171   08/12/2019 Tumor Marker   Patient's tumor was tested for the following markers: CA-125 Results of the tumor marker test revealed 173   09/02/2019 Tumor Marker   Patient's tumor was tested for the following markers: CA-125 Results of the tumor marker test revealed 159   09/30/2019 Tumor Marker   Patient's tumor was tested for the following markers: CA-125 Results of the tumor marker test revealed 168   11/08/2019 Imaging   Outside CT imaging 1. Study was performed at outside institution on 11/08/2019 and presented for review on 11/14/2019.  2. Stable calcified and noncalcified left-sided nodular and plaque-like pleural abnormalities with small chronic left pleural effusion compatible with stable treated pleural metastasis. No new or enlarging lesions.  3. No enlarging thoracic lymph nodes.  4. Redemonstration of left breast mass. Correlation with mammography is suggested if not already performed   12/02/2019 Tumor Marker   Patient's tumor was tested for the following markers: CA-125 Results of the tumor marker test revealed 186   12/18/2019 Cancer Staging   Staging form: Ovary, Fallopian Tube, and Primary  Peritoneal Carcinoma, AJCC 8th Edition - Clinical stage from 12/18/2019: FIGO Stage IV (rcT3c, cN0, cM1) - Signed by [name], MD on 04/24/2020   01/09/2020 Tumor Marker   Patient's tumor was tested for the following markers: CA-125 Results of the tumor marker test revealed 184   01/10/2020 Imaging   1. Treated left pleural and peritoneal metastatic disease. Areas of residual soft tissue prominence in the right anatomic pelvis are indeterminate. Comparison with prior exams would be helpful. 2. Hepatomegaly. Low-attenuation lesions in the liver are too small to characterize. Comparison with prior exams would be helpful. 3. Small left fibrothorax. 4. Infrarenal Aortic aneurysm NOS (ICD10-I71.9). Recommend followup by ultrasound in 2 years.  5. Aortic  atherosclerosis (ICD10-I70.0). Coronary artery calcification.   03/09/2020 Tumor Marker   Patient's tumor was tested for the following markers: CA-125 Results of the tumor marker test revealed 214   04/01/2020 Tumor Marker   Patient's tumor was tested for the following markers: CA-125 Results of the tumor marker test revealed 245.   04/03/2020 Imaging   1. No acute findings in the abdomen or pelvis. Specifically, no findings to explain the patient's history of left upper quadrant pain with nausea and vomiting. 2. Stable appearance of calcified omental and mesenteric nodules consistent with treated metastatic disease. Soft tissue fullness seen in the right adnexal region on the prior exam is similar today. 3. Stable 1.6 x 1.2 cm low-density lesion lateral segment left liver. 4. 3.5 cm infrarenal abdominal aortic aneurysm. Recommend follow-up ultrasound every 2 years. This recommendation follows ACR consensus guidelines: White Paper of the ACR Incidental Findings Committee II on Vascular Findings. J Am Coll Radiol 2013; 67:591-638. 5. Aortic Atherosclerosis (ICD10-I70.0).   04/15/2020 Imaging   Focus of abnormal uptake is seen involving the greater trochanter of the proximal left femur which corresponds to sclerotic density seen on prior CT scan and is concerning for metastatic disease.   04/23/2020 Imaging   1. No hip fracture, dislocation or avascular necrosis. 2. A 18 mm bone lesion in the peripheral superior aspect of the left greater trochanter with enhancement on postcontrast imaging. The overall appearance is most concerning for metastatic disease in the setting of known malignancy. 3. 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 4. Degenerative disease with disc height loss at L4-5 and L5-S1 with bilateral facet arthropathy.     REVIEW OF SYSTEMS:   Constitutional: Denies fevers, chills or abnormal weight loss Eyes: Denies blurriness of vision Ears, nose, mouth, throat, and face:  Denies mucositis or sore throat Respiratory: Denies cough, dyspnea or wheezes Cardiovascular: Denies palpitation, chest discomfort or lower extremity swelling Gastrointestinal:  Denies nausea, heartburn or change in bowel habits Skin: Denies abnormal skin rashes Lymphatics: Denies new lymphadenopathy or easy bruising All other systems were reviewed with the patient and are negative.  I have reviewed the past medical history, past surgical history, social history and family history with the patient and they are unchanged from previous note.  ALLERGIES:  is allergic to latex and sulfamethoxazole-trimethoprim.  MEDICATIONS:  Current Outpatient Medications  Medication Sig Dispense Refill  . albuterol (VENTOLIN HFA) 108 (90 Base) MCG/ACT inhaler Inhale 2 puffs into the lungs as needed. 2 puffs into lungs every 6 hours as needed for wheezing or SOB    . aspirin 325 MG EC tablet Take 325 mg by mouth daily.    . clonazePAM (KLONOPIN) 1 MG tablet Take 1 mg by mouth 3 (three) times daily as needed for anxiety.    Marland Kitchen  cyclobenzaprine (FLEXERIL) 5 MG tablet TAKE 1 TABLET (5 MG TOTAL) BY MOUTH 3 (THREE) TIMES DAILY AS NEEDED. 60 tablet 1  . dexamethasone (DECADRON) 4 MG tablet Take 1 tablet (4 mg total) by mouth daily. 30 tablet 1  . DILAUDID 2 MG tablet Take 1 tablet (2 mg total) by mouth 4 (four) times daily as needed for severe pain. 90 tablet 0  . docusate sodium (COLACE) 100 MG capsule Take 100 mg by mouth 2 (two) times daily.    Marland Kitchen gabapentin (NEURONTIN) 300 MG capsule Take 3 capsules (900 mg total) by mouth 3 (three) times daily. 270 capsule 4  . hydrochlorothiazide (HYDRODIURIL) 25 MG tablet Take 25 mg by mouth daily.    Marland Kitchen ibuprofen (ADVIL) 600 MG tablet Take 600 mg by mouth every 6 (six) hours as needed.    . lidocaine-prilocaine (EMLA) cream Apply 1 application topically as needed (port).     Marland Kitchen lisinopril-hydrochlorothiazide (ZESTORETIC) 20-25 MG tablet Take 1 tablet by mouth 2 (two) times daily.     Marland Kitchen loperamide (IMODIUM A-D) 2 MG tablet Take 2 mg by mouth 4 (four) times daily as needed for diarrhea or loose stools.    Marland Kitchen MARINOL 5 MG capsule Take 5 mg by mouth 3 (three) times daily.    Marland Kitchen morphine (MS CONTIN) 30 MG 12 hr tablet Take 1 tablet (30 mg total) by mouth every 8 (eight) hours. 90 tablet 0  . naloxone (NARCAN) 2 MG/2ML injection Place 2 mg into the nose as needed.     Marland Kitchen OLANZapine (ZYPREXA) 10 MG tablet Take 10 mg by mouth at bedtime.    . ondansetron (ZOFRAN) 8 MG tablet Take 8 mg by mouth every 8 (eight) hours as needed for nausea.     . rosuvastatin (CRESTOR) 40 MG tablet Take 40 mg by mouth daily.    . simethicone (MYLICON) 80 MG chewable tablet Chew 80 mg by mouth every 6 (six) hours as needed for flatulence.      No current facility-administered medications for this visit.   Facility-Administered Medications Ordered in Other Visits  Medication Dose Route Frequency Provider Last Rate Last Admin  . heparin lock flush 100 unit/mL  500 Units Intracatheter Once [name], MD        PHYSICAL EXAMINATION: ECOG PERFORMANCE STATUS: 2 - Symptomatic, <50% confined to bed  Vitals:   06/08/20 1032  BP: 137/86  Pulse: 70  Resp: 18  Temp: (!) 97.1 F (36.2 C)  SpO2: 100%   Filed Weights   06/08/20 1032  Weight: 195 lb 12.8 oz (88.8 kg)    GENERAL:alert, no distress and comfortable NEURO: alert & oriented x 3 with fluent speech, no focal motor/sensory deficits  LABORATORY DATA:  I have reviewed the data as listed    Component Value Date/Time   NA 135 06/08/2020 1025   K 4.1 06/08/2020 1025   CL 102 06/08/2020 1025   CO2 21 (L) 06/08/2020 1025   GLUCOSE 166 (H) 06/08/2020 1025   BUN 22 (H) 06/08/2020 1025   CREATININE 1.00 06/08/2020 1025   CREATININE 0.83 05/18/2020 1313   CALCIUM 9.3 06/08/2020 1025   PROT 6.6 06/08/2020 1025   ALBUMIN 3.7 06/08/2020 1025   AST 16 06/08/2020 1025   AST 21 05/18/2020 1313   ALT 27 06/08/2020 1025   ALT 33 05/18/2020 1313    ALKPHOS 70 06/08/2020 1025   BILITOT 0.3 06/08/2020 1025   BILITOT 0.6 05/18/2020 1313   GFRNONAA >60 06/08/2020 1025  GFRNONAA >60 05/18/2020 1313   GFRAA >60 03/09/2020 1005   GFRAA >60 01/09/2020 1404    No results found for: SPEP, UPEP  Lab Results  Component Value Date   WBC 11.7 (H) 06/08/2020   NEUTROABS 9.8 (H) 06/08/2020   HGB 14.0 06/08/2020   HCT 42.7 06/08/2020   MCV 90.1 06/08/2020   PLT 189 06/08/2020      Chemistry      Component Value Date/Time   NA 135 06/08/2020 1025   K 4.1 06/08/2020 1025   CL 102 06/08/2020 1025   CO2 21 (L) 06/08/2020 1025   BUN 22 (H) 06/08/2020 1025   CREATININE 1.00 06/08/2020 1025   CREATININE 0.83 05/18/2020 1313      Component Value Date/Time   CALCIUM 9.3 06/08/2020 1025   ALKPHOS 70 06/08/2020 1025   AST 16 06/08/2020 1025   AST 21 05/18/2020 1313   ALT 27 06/08/2020 1025   ALT 33 05/18/2020 1313   BILITOT 0.3 06/08/2020 1025   BILITOT 0.6 05/18/2020 1313       RADIOGRAPHIC STUDIES: I have personally reviewed the radiological images as listed and agreed with the findings in the report. DG Abd 2 Views  Result Date: 05/19/2020 CLINICAL DATA:  Abdominal pain and distention. History of ovarian cancer. EXAM: ABDOMEN - 2 VIEW COMPARISON:  None. FINDINGS: The bowel gas pattern is normal. There is no evidence of free air. Phleboliths are noted in the pelvis. IMPRESSION: No evidence of bowel obstruction or ileus. Electronically Signed   By: Marijo Conception M.D.   On: 05/19/2020 12:58

## 2020-06-08 NOTE — Assessment & Plan Note (Addendum)
She has severe peripheral neuropathy from prior treatment She will continue pain medicine and gabapentin She is weak and had multiple recent falls I will consult physical therapy and rehab

## 2020-06-08 NOTE — Assessment & Plan Note (Signed)
She continues to have severe pain despite recent escalation of pain medicine For now, we will continue current prescribed dose of narcotics I refill her prescription of morphine today

## 2020-06-08 NOTE — Assessment & Plan Note (Signed)
She has experienced multiple side effects since completion of radiation therapy I plan to order CT imaging next month for objective assessment of response to therapy She would likely need to go back on chemotherapy Given severe peripheral neuropathy, I might consider carboplatin with either gemcitabine or doxorubicin as treatment options for her I am concerned about her poor social circumstances and will consult social worker for assistance She also benefit from physical therapy and rehab given her weakness

## 2020-06-09 ENCOUNTER — Encounter: Payer: Self-pay | Admitting: *Deleted

## 2020-06-09 ENCOUNTER — Telehealth: Payer: Self-pay | Admitting: Oncology

## 2020-06-09 LAB — CA 125: Cancer Antigen (CA) 125: 272 U/mL — ABNORMAL HIGH (ref 0.0–38.1)

## 2020-06-09 NOTE — Telephone Encounter (Signed)
Heidi Johnson called and said there is a PT facility in [place] that has water/pool therapy that would be good for her neuropathy.  She is wondering if we can send her referral there so she doesn't have to drive to [place] as often.  She has also seen her CA 125 result from yesterday and wants to know if she needs to do anything.  She has a CT scheduled on 06/24/20.

## 2020-06-09 NOTE — Telephone Encounter (Signed)
I am not licensed to practice in [place] so I am not sure if I can send an order there She will have to get a local MD to prescribe the PT there Nothing needs to be done sooner with the CT

## 2020-06-09 NOTE — Telephone Encounter (Signed)
Called Heidi Johnson back and advised her of message below from Dr. Alvy Bimler.  She verbalized understanding and will go see her PCP tomorrow to see if they will send the order.    Also advised her of new appointment time to see Dr. Sondra Come on 06/25/20 at 9:45.

## 2020-06-09 NOTE — Progress Notes (Signed)
[place] Work  Initial Assessment   Heidi Johnson is a 49 y.o. year old female. Clinical Social Work was referred by medical oncologist for assessment of psychosocial needs.   SDOH (Social Determinants of Health) assessments performed: No   Distress Screen completed: Yes ONCBCN DISTRESS SCREENING 05/18/2020  Screening Type Initial Screening  Distress experienced in past week (1-10) 2  Family Problem type Children  Physical Problem type Nausea/vomiting;Loss of appetitie;Constipation/diarrhea;Other (comment)  Physician notified of physical symptoms Yes  Referral to clinical psychology No  Referral to clinical social work No  Referral to dietition No  Referral to financial advocate No  Referral to support programs No  Referral to palliative care No      Family/Social Information:  . Housing Arrangement: patient lives alone . Family members/support persons in your life? Family - minimal support . Transportation concerns: yes, patient drives from [place]  . Employment: Disabled Income source: [name] . Financial concerns: Yes, current concerns o Type of concern: Transportation and Care giving (Child care or elder care services) . Food access concerns: no . Religious or spiritual practice: yes . Medication Concerns: no  . Services Currently in place:  Section 8 Housing  Coping/ Adjustment to diagnosis: . Patient understands treatment plan and what happens next? yes . Concerns about diagnosis and/or treatment: Managing side effects, being "strong enough for treatment" . Patient reported stressors: Children, Adjusting to my illness and Physical issues . Hopes and priorities: Managing her cancer is her priority  . Patient enjoys none identified . Current coping skills/ strengths: Ability for insight and Capable of independent living    SUMMARY: Current SDOH Barriers:  . Financial constraints related to fixed income, Limited social support,  Transportation, Housing barriers and Social Isolation  Clinical Social Work Clinical Goal(s):  Marland Kitchen Patient will follow up with CSW at next follow up visit once treatment plan is established.  Interventions: . Discussed common feeling and emotions when being diagnosed with cancer, and the importance of support during treatment . Informed patient of the support team roles and support services at [facility] . Provided CSW contact information and encouraged patient to call with any questions or concerns . Provided patient with information about [employer], how to apply for Section 8 housing in [place], and transportation services   Follow Up Plan: Patient will contact CSW with any support or resource needs Patient verbalizes understanding of plan: Yes    Kennith Center , LCSW

## 2020-06-22 ENCOUNTER — Ambulatory Visit: Payer: Medicare Other | Admitting: Radiation Oncology

## 2020-06-22 ENCOUNTER — Ambulatory Visit: Payer: Self-pay | Admitting: Radiation Oncology

## 2020-06-22 ENCOUNTER — Telehealth: Payer: Self-pay | Admitting: Oncology

## 2020-06-22 NOTE — Telephone Encounter (Signed)
Heidi Johnson called and requested a refill of dilaudid to be sent to the [facility] in [place].  She has 4 tablets left (has 3 for today and one for tomorrow).  She is currently talking Morphine 30 mg at 6 am, 2pm and 10 pm with dilaudid 1 tablet q 4 hours in between.  She said her pain is getting worse.  She has been in the bed and has been using a walker to help her walk.  She wants to discuss changing/increasing her pain medications with Dr. Bertis Ruddy at her appointment on 06/25/20.

## 2020-06-23 ENCOUNTER — Other Ambulatory Visit: Payer: Self-pay | Admitting: Hematology and Oncology

## 2020-06-23 MED ORDER — HYDROMORPHONE HCL 4 MG PO TABS: 4.0000 mg | ORAL_TABLET | ORAL | 0 refills | Status: DC | PRN

## 2020-06-23 NOTE — Telephone Encounter (Signed)
Called Heidi Johnson and advised her the refill of dilaudid has been sent and also that it has been increased to 4 mg.

## 2020-06-23 NOTE — Telephone Encounter (Signed)
I sent higher dose dilaudid 4 mg to her pharmacy

## 2020-06-23 NOTE — Telephone Encounter (Signed)
Heidi Johnson called back and asked what she can do for constipation.  She is currently taking Miralax once a day and colace.  Advised her she can increase the Miralax to twice a day and she can add Senakot three times a day as needed.  She verbalized understanding.

## 2020-06-24 ENCOUNTER — Ambulatory Visit (HOSPITAL_COMMUNITY)
Admission: RE | Admit: 2020-06-24 | Discharge: 2020-06-24 | Disposition: A | Discharge status: Home / Self Care | Payer: Medicare Other | Source: Ambulatory Visit | Attending: Hematology and Oncology | Admitting: Hematology and Oncology

## 2020-06-24 ENCOUNTER — Other Ambulatory Visit: Payer: Self-pay

## 2020-06-24 DIAGNOSIS — I714 Abdominal aortic aneurysm, without rupture, unspecified: Secondary | ICD-10-CM

## 2020-06-24 DIAGNOSIS — C569 Malignant neoplasm of unspecified ovary: Secondary | ICD-10-CM

## 2020-06-24 MED ORDER — HEPARIN SOD (PORK) LOCK FLUSH 100 UNIT/ML IV SOLN
INTRAVENOUS | Status: AC
  Filled 2020-06-24: qty 5

## 2020-06-24 MED ORDER — HEPARIN SOD (PORK) LOCK FLUSH 100 UNIT/ML IV SOLN
500.0000 [IU] | Freq: Once | INTRAVENOUS | Status: AC
  Administered 2020-06-24: 500 [IU] via INTRAVENOUS

## 2020-06-24 MED ORDER — IOHEXOL 300 MG/ML  SOLN
100.0000 mL | Freq: Once | INTRAMUSCULAR | Status: AC | PRN
  Administered 2020-06-24: 100 mL via INTRAVENOUS

## 2020-06-25 ENCOUNTER — Other Ambulatory Visit: Payer: Self-pay | Admitting: Hematology and Oncology

## 2020-06-25 ENCOUNTER — Other Ambulatory Visit: Payer: Self-pay

## 2020-06-25 ENCOUNTER — Encounter: Payer: Self-pay | Admitting: Radiation Oncology

## 2020-06-25 ENCOUNTER — Inpatient Hospital Stay: Payer: Medicare Other | Attending: Hematology and Oncology | Admitting: Hematology and Oncology

## 2020-06-25 ENCOUNTER — Ambulatory Visit
Admission: RE | Admit: 2020-06-25 | Discharge: 2020-06-25 | Disposition: A | Discharge status: Home / Self Care | Payer: Medicare Other | Source: Ambulatory Visit | Attending: Radiation Oncology | Admitting: Radiation Oncology

## 2020-06-25 VITALS — BP 122/91 | HR 98 | Temp 97.7°F | Resp 18 | Ht 69.0 in | Wt 196.8 lb

## 2020-06-25 DIAGNOSIS — K5909 Other constipation: Secondary | ICD-10-CM

## 2020-06-25 DIAGNOSIS — Z923 Personal history of irradiation: Secondary | ICD-10-CM | POA: Insufficient documentation

## 2020-06-25 DIAGNOSIS — Z79899 Other long term (current) drug therapy: Secondary | ICD-10-CM | POA: Insufficient documentation

## 2020-06-25 DIAGNOSIS — Z5111 Encounter for antineoplastic chemotherapy: Secondary | ICD-10-CM | POA: Diagnosis not present

## 2020-06-25 DIAGNOSIS — Z604 Social exclusion and rejection: Secondary | ICD-10-CM

## 2020-06-25 DIAGNOSIS — K5900 Constipation, unspecified: Secondary | ICD-10-CM | POA: Diagnosis not present

## 2020-06-25 DIAGNOSIS — G629 Polyneuropathy, unspecified: Secondary | ICD-10-CM | POA: Diagnosis not present

## 2020-06-25 DIAGNOSIS — Z7982 Long term (current) use of aspirin: Secondary | ICD-10-CM | POA: Diagnosis not present

## 2020-06-25 DIAGNOSIS — C7951 Secondary malignant neoplasm of bone: Secondary | ICD-10-CM | POA: Diagnosis present

## 2020-06-25 DIAGNOSIS — G894 Chronic pain syndrome: Secondary | ICD-10-CM

## 2020-06-25 DIAGNOSIS — C569 Malignant neoplasm of unspecified ovary: Secondary | ICD-10-CM | POA: Insufficient documentation

## 2020-06-25 DIAGNOSIS — Z9221 Personal history of antineoplastic chemotherapy: Secondary | ICD-10-CM | POA: Diagnosis not present

## 2020-06-25 DIAGNOSIS — I714 Abdominal aortic aneurysm, without rupture: Secondary | ICD-10-CM | POA: Insufficient documentation

## 2020-06-25 DIAGNOSIS — C7802 Secondary malignant neoplasm of left lung: Secondary | ICD-10-CM | POA: Insufficient documentation

## 2020-06-25 DIAGNOSIS — I700 Atherosclerosis of aorta: Secondary | ICD-10-CM | POA: Insufficient documentation

## 2020-06-25 DIAGNOSIS — Z7189 Other specified counseling: Secondary | ICD-10-CM | POA: Diagnosis not present

## 2020-06-25 MED ORDER — ONDANSETRON 8 MG PO TBDP: 8.0000 mg | ORAL_TABLET | Freq: Three times a day (TID) | ORAL | 1 refills | Status: AC | PRN

## 2020-06-25 MED ORDER — PROCHLORPERAZINE MALEATE 10 MG PO TABS: 10.0000 mg | ORAL_TABLET | Freq: Four times a day (QID) | ORAL | 1 refills | Status: DC | PRN

## 2020-06-25 NOTE — Assessment & Plan Note (Signed)
She has social isolation and poor social circumstances I have consulted social worker to follow-up Her son, Molly Maduro, felt that he can take care of the patient care in [place] if needed

## 2020-06-25 NOTE — Progress Notes (Signed)
Patient here for a f/u visit with Dr. Roselind Messier. She reports generalized pain of a "7." She also reports nausea from meds she has been on. She sees Dr. Bertis Ruddy today at 1245 pm.  BP 129/82 (BP Location: Right Arm, Patient Position: Sitting, Cuff Size: Normal)   Pulse 67   Temp 98.2 F (36.8 C)   Resp 20   Ht 5\' 9"  (1.753 m)   Wt 195 lb 12.8 oz (88.8 kg)   SpO2 96%   BMI 28.91 kg/m   Wt Readings from Last 3 Encounters:  06/25/20 195 lb 12.8 oz (88.8 kg)  06/08/20 195 lb 12.8 oz (88.8 kg)  05/19/20 193 lb 12.8 oz (87.9 kg)

## 2020-06-25 NOTE — Progress Notes (Signed)
Radiation Oncology         [phone] ________________________________  Name: Heidi Johnson MRN: [id number]  Date: 06/25/2020  DOB: 08-09-1970  Follow-Up Visit Note  CC: Pcp, No  [name], MD    ICD-10-CM   1. Malignant neoplasm of ovary, unspecified laterality ([place])  C56.9     Diagnosis: Stage IV (rcT3c, cN0, cM1) ovarian cancer  Interval Since Last Radiation: One month and five days  Radiation Treatment Dates: 05/06/2020 through 05/20/2020 Site Technique Total Dose (Gy) Dose per Fx (Gy) Completed Fx Beam Energies  Femur Left: Ext_Lt Complex 30/30 3 10/10 10X, 15X    Narrative:  The patient returns today for routine follow-up. She was last seen by Dr. Alvy Bimler on 05/21/2020, who noted that she would most likely need to go back on chemotherapy. However, given her severe peripheral neuropathy, it was recommended that she try Carboplatin with either Gemcitabine or Doxorubicin as treatment options.     CT scan of abdomen and pelvis on 06/24/2020 showed overall progression of the calcified and noncalcified peritoneal surface and omental lesions. There was also noted to be slight progression of disease at the left lung base and involving the pleura. The sclerotic bone lesion involving the left greater trochanter was stable, as well as the infrarenal abdominal aortic aneurysm.  On review of systems, she reports generalized pain with no particular site being more uncomfortable she denies any significant pain in the left upper leg area at this time..                    ALLERGIES:  is allergic to latex and sulfamethoxazole-trimethoprim.  Meds: Current Outpatient Medications  Medication Sig Dispense Refill  . albuterol (VENTOLIN HFA) 108 (90 Base) MCG/ACT inhaler Inhale 2 puffs into the lungs as needed. 2 puffs into lungs every 6 hours as needed for wheezing or SOB    . aspirin 325 MG EC tablet Take 325 mg by mouth daily.    . clonazePAM (KLONOPIN) 1 MG tablet Take 1 mg by mouth 3  (three) times daily as needed for anxiety.    . cyclobenzaprine (FLEXERIL) 5 MG tablet TAKE 1 TABLET (5 MG TOTAL) BY MOUTH 3 (THREE) TIMES DAILY AS NEEDED. 60 tablet 1  . dexamethasone (DECADRON) 4 MG tablet Take 1 tablet (4 mg total) by mouth daily. 30 tablet 1  . docusate sodium (COLACE) 100 MG capsule Take 100 mg by mouth 2 (two) times daily.    Marland Kitchen gabapentin (NEURONTIN) 300 MG capsule Take 3 capsules (900 mg total) by mouth 3 (three) times daily. 270 capsule 4  . hydrochlorothiazide (HYDRODIURIL) 25 MG tablet Take 25 mg by mouth daily.    Marland Kitchen HYDROmorphone (DILAUDID) 4 MG tablet Take 1 tablet (4 mg total) by mouth every 4 (four) hours as needed for severe pain. 90 tablet 0  . ibuprofen (ADVIL) 600 MG tablet Take 600 mg by mouth every 6 (six) hours as needed.    . lidocaine-prilocaine (EMLA) cream Apply 1 application topically as needed (port).     Marland Kitchen lisinopril-hydrochlorothiazide (ZESTORETIC) 20-25 MG tablet Take 1 tablet by mouth 2 (two) times daily.    Marland Kitchen loperamide (IMODIUM A-D) 2 MG tablet Take 2 mg by mouth 4 (four) times daily as needed for diarrhea or loose stools.    Marland Kitchen MARINOL 5 MG capsule Take 5 mg by mouth 3 (three) times daily.    Marland Kitchen morphine (MS CONTIN) 30 MG 12 hr tablet Take 1 tablet (30 mg total)  by mouth every 8 (eight) hours. 90 tablet 0  . naloxone (NARCAN) 2 MG/2ML injection Place 2 mg into the nose as needed.     Marland Kitchen OLANZapine (ZYPREXA) 10 MG tablet Take 10 mg by mouth at bedtime.    . ondansetron (ZOFRAN) 8 MG tablet Take 8 mg by mouth every 8 (eight) hours as needed for nausea.     . rosuvastatin (CRESTOR) 40 MG tablet Take 40 mg by mouth daily.    . simethicone (MYLICON) 80 MG chewable tablet Chew 80 mg by mouth every 6 (six) hours as needed for flatulence.      No current facility-administered medications for this encounter.   Facility-Administered Medications Ordered in Other Encounters  Medication Dose Route Frequency Provider Last Rate Last Admin  . heparin lock flush  100 unit/mL  500 Units Intracatheter Once Artis Delay, MD        Physical Findings: The patient is in no acute distress. Patient is alert and oriented.  height is 5\' 9"  (1.753 m) and weight is 195 lb 12.8 oz (88.8 kg). Her temperature is 98.2 F (36.8 C). Her blood pressure is 129/82 and her pulse is 67. Her respiration is 20 and oxygen saturation is 96%.  No significant changes. Lungs are clear to auscultation bilaterally. Heart has regular rate and rhythm. No palpable cervical, supraclavicular, or axillary adenopathy. Abdomen soft, non-tender, normal bowel sounds.  Ambulates with the assistance of a walker.  Lab Findings: Lab Results  Component Value Date   WBC 11.7 (H) 06/08/2020   HGB 14.0 06/08/2020   HCT 42.7 06/08/2020   MCV 90.1 06/08/2020   PLT 189 06/08/2020    Radiographic Findings: CT ABDOMEN PELVIS W CONTRAST  Result Date: 06/24/2020 CLINICAL DATA:  Restaging ovarian cancer. EXAM: CT ABDOMEN AND PELVIS WITH CONTRAST TECHNIQUE: Multidetector CT imaging of the abdomen and pelvis was performed using the standard protocol following bolus administration of intravenous contrast. CONTRAST:  08/22/2020 OMNIPAQUE IOHEXOL 300 MG/ML  SOLN COMPARISON:  CT scan 04/03/2020 FINDINGS: Lower chest: Stable appearing extensive calcified tumor at the left lung base mainly involving the pleural surfaces. Partially calcified epicardial lymph node on right measures 13 x 13 mm and previously measured 10.5 x 10.5 mm. The heart is normal in size. No pericardial effusion. Distal esophagus is grossly normal. Hepatobiliary: Numerous calcified peritoneal surface lesions around the liver appear relatively stable. There are also partially calcified lesions in the falciform ligament. The largest lesion measures 13 mm on image 27/2. It previously measured 11.5 mm. The hepatic and portal veins are patent. Gallbladder is surgically absent. Calcified lesion in the gallbladder fossa on image number 30/2 measures 11.5 mm and  previously measured 9 mm. No common bile duct dilatation. Pancreas: No mass, inflammation or ductal dilatation. Spleen: Normal size. No intraparenchymal lesions. Numerous peritoneal surface lesions are again demonstrated. Adrenals/Urinary Tract: The adrenal glands and kidneys are unremarkable and stable. The bladder is grossly normal. Stomach/Bowel: The stomach, duodenum, small bowel and colon are grossly normal. No obstructive findings. Peritoneal surface lesions are again demonstrated. Vascular/Lymphatic: Stable advanced atherosclerotic calcifications involving the aorta and branch vessels. Stable 3.4 x 3.3 cm infrarenal abdominal aortic aneurysm. Diffuse omental and peritoneal surface disease is again demonstrated with numerous calcified and noncalcified nodules. Partially calcified omental lesion on image 22/2 near the stomach measures 23 x 19.5 mm and previously measured 16 x 11.5 mm. The lesion in the hepatoduodenal space on image 32/2 measures 16 x 13 mm. This previously measured 14 x 8  mm. Implant in the lower right pelvis anteriorly measures 40.5 x 15 mm on image 78/2. This previously measured 35 x 13 mm. Reproductive: Surgically absent. Other: No free air or free fluid collections. Musculoskeletal: Stable sclerotic lesion involving the left greater trochanter. A few smaller scattered sclerotic bone lesions are also stable. IMPRESSION: 1. Overall progression of calcified and noncalcified peritoneal surface and omental lesions. 2. Slight progression of disease at the left lung base and involving the pleura. 3. Stable sclerotic bone lesion involving the left greater trochanter. 4. Stable infrarenal abdominal aortic aneurysm. Aortic Atherosclerosis (ICD10-I70.0). Electronically Signed   By: Marijo Sanes M.D.   On: 06/24/2020 14:08    Impression:  Stage IV (rcT3c, cN0, cM1) ovarian cancer  The patient is recovering from the effects of radiation. Most recent CT scan of abdomen and pelvis showed slight  progression of disease at the left lung base and involving the pleura. There was also noted to be overall progression of calcified and noncalcified peritoneal surface and omental lesions.  Stability was noted along the left proximal femur which was recently treated.  Plan: The patient is scheduled to see Dr. Alvy Bimler later today.  Dr.  Alvy Bimler has requested that her son be present for this discussion given the severity of her situation.  As needed follow-up in radiation oncology   ___________________________________   Blair Promise, PhD, MD  This document serves as a record of services personally performed by Gery Pray, MD. It was created on his behalf by Clerance Lav, a trained medical scribe. The creation of this record is based on the scribe's personal observations and the provider's statements to them. This document has been checked and approved by the attending provider.

## 2020-06-25 NOTE — Progress Notes (Addendum)
Error

## 2020-06-25 NOTE — Assessment & Plan Note (Signed)
We discussed goals of care She would like to proceed with palliative chemotherapy

## 2020-06-25 NOTE — Assessment & Plan Note (Signed)
She has multifactorial pain, related to prior neuropathy, chronic pain syndrome and undisclosed cause of her severe diffuse pain Recently, I have increased the dose of MS Contin to 30 mg 3 times a day along with Dilaudid 4 mg as needed I plan to increase MS Contin when she is due for medication refill We discussed narcotic refill policy and warned about risk of sedation and constipation

## 2020-06-25 NOTE — Progress Notes (Signed)
START OFF PATHWAY REGIMEN - Ovarian   [id number]:Carboplatin AUC=4 D1 + Gemcitabine 800 mg/m2 D1, 8 q21 Days:   A cycle is every 21 days:     Gemcitabine      Carboplatin   **Always confirm dose/schedule in your pharmacy ordering system**  Patient Characteristics: Recurrent or Progressive Disease, Second Line, Platinum Sensitive and ? 6 Months Since Last Therapy, Not a Candidate for Secondary Debulking Surgery BRCA Mutation Status: Absent Therapeutic Status: Recurrent or Progressive Disease Line of Therapy: Second Line  Intent of Therapy: Non-Curative / Palliative Intent, Discussed with Patient

## 2020-06-25 NOTE — Assessment & Plan Note (Signed)
She has chronic constipation We have extensive discussion about aggressive laxative therapy

## 2020-06-25 NOTE — Assessment & Plan Note (Signed)
The patient is in significant distress throughout the visit I reviewed imaging study results with her and her son Unfortunately, she has documented disease recurrence throughout The patient is in severe pain despite recent escalation of her pain medicine We discussed the risk and benefits of treatment and she is in agreement to proceed with palliative chemotherapy Given her pre-existing severe peripheral neuropathy, I plan to avoid taxanes With her history of aortic aneurysm, I would not want to prescribe doxorubicin or bevacizumab for now I recommend a trial of carboplatin with gemcitabine We discussed the risk, benefits, side effects of treatment and she is in agreement to proceed I will see her on a weekly basis when she returns for treatment and follow-up I recommend minimum 3 cycles of treatment before repeating imaging study

## 2020-06-25 NOTE — Progress Notes (Signed)
[place] OFFICE PROGRESS NOTE  Patient Care Team: Pcp, No as PCP - General  ASSESSMENT & PLAN:  Ovarian cancer ([place]) The patient is in significant distress throughout the visit I reviewed imaging study results with her and her son Unfortunately, she has documented disease recurrence throughout The patient is in severe pain despite recent escalation of her pain medicine We discussed the risk and benefits of treatment and she is in agreement to proceed with palliative chemotherapy Given her pre-existing severe peripheral neuropathy, I plan to avoid taxanes With her history of aortic aneurysm, I would not want to prescribe doxorubicin or bevacizumab for now I recommend a trial of carboplatin with gemcitabine We discussed the risk, benefits, side effects of treatment and she is in agreement to proceed I will see her on a weekly basis when she returns for treatment and follow-up I recommend minimum 3 cycles of treatment before repeating imaging study  Chronic pain syndrome She has multifactorial pain, related to prior neuropathy, chronic pain syndrome and undisclosed cause of her severe diffuse pain Recently, I have increased the dose of MS Contin to 30 mg 3 times a day along with Dilaudid 4 mg as needed I plan to increase MS Contin when she is due for medication refill We discussed narcotic refill policy and warned about risk of sedation and constipation  Other constipation She has chronic constipation We have extensive discussion about aggressive laxative therapy  Goals of care, counseling/discussion We discussed goals of care She would like to proceed with palliative chemotherapy  Social isolation She has social isolation and poor social circumstances I have consulted social worker to follow-up Her son, Herbie [place], felt that he can take care of the patient care in [place] if needed   Orders Placed This Encounter  Procedures  . CBC with Differential (Cancer  Center Only)    Standing Status:   Standing    Number of Occurrences:   20    Standing Expiration Date:   06/25/2021  . CMP ([facility] only)    Standing Status:   Standing    Number of Occurrences:   20    Standing Expiration Date:   06/25/2021    All questions were answered. The patient knows to call the clinic with any problems, questions or concerns. The total time spent in the appointment was 40 minutes encounter with patients including review of chart and various tests results, discussions about plan of care and coordination of care plan   [name], MD 06/25/2020 1:34 PM  INTERVAL HISTORY: Please see below for problem oriented charting. She returns with her son, Herbie [place], for further follow-up The patient is crying and very distress in the room She repeatedly stated that her family does not care about her From a prior visit, I have consulted social worker She is in the process of trying to relocate to [place] to be closer with family However, she stated that her daughters do not care about her wellbeing Her son today felt that he can take care of her if needed She continues to have severe diffuse neuropathy and severe diffuse pain, despite recent escalation of pain management She has gained some weight due to recent steroid treatment She denies recent fall She has some mild constipation, alleviated by laxatives  SUMMARY OF ONCOLOGIC HISTORY: Oncology History Overview Note  Negative genetics at [facility]   Ovarian cancer ([place])  04/25/2016 Imaging   CT confirms 12cm ovarian masses with extensive peritoneal metastases and malignant  pleural effusions   05/05/2016 Surgery   s/p ex-lap posterior exenteration, diaphragm stripping, cholecystectomy, omentectomy, appendectomy and optimal tumor debulking on 05/05/16   06/24/2016 - 10/24/2016 Chemotherapy   Started Taxol/Carboplatin. Dose reduced Taxol with cycle 4 for neuropathy, Taxol discontinued cycle 5   10/24/2016 - 08/20/2018  Anti-estrogen oral therapy   started maintenance letrozole   08/20/2018 -  Chemotherapy   The patient had maintenance Avastin, discontinued due to TIA and uncontrolled hypertension    02/04/2019 Tumor Marker   Patient's tumor was tested for the following markers: CA-125 Results of the tumor marker test revealed 167   02/05/2019 Imaging   Outside CT chest  1.  Numerous left predominant calcified pleural metastases and associated small left pleural effusion are unchanged.  2.  Unchanged (3.0 cm) (series 3, image 96) left breast mass    02/07/2019 Imaging   Outside Ct abdomen and pelvis 1. Overall stable small volume peritoneal tumor, as described above.  2. Unchanged infrarenal abdominal aortic aneurysm.    02/22/2019 Tumor Marker   Patient's tumor was tested for the following markers: CA-125 Results of the tumor marker test revealed 164   03/18/2019 Tumor Marker   Patient's tumor was tested for the following markers: CA-125 Results of the tumor marker test revealed 162   04/08/2019 Tumor Marker   Patient's tumor was tested for the following markers: CA-125 Results of the tumor marker test revealed 164   04/29/2019 Tumor Marker   Patient's tumor was tested for the following markers: CA-125 Results of the tumor marker test revealed 181   05/20/2019 Tumor Marker   Patient's tumor was tested for the following markers: CA-125 Results of the tumor marker test revealed 164   06/10/2019 Tumor Marker   Patient's tumor was tested for the following markers: CA-125 Results of the tumor marker test revealed 152   07/01/2019 Tumor Marker   Patient's tumor was tested for the following markers: CA-125 Results of the tumor marker test revealed 160   07/22/2019 Tumor Marker   Patient's tumor was tested for the following markers: CA-125 Results of the tumor marker test revealed 171   08/12/2019 Tumor Marker   Patient's tumor was tested for the following markers: CA-125 Results of the tumor  marker test revealed 173   09/02/2019 Tumor Marker   Patient's tumor was tested for the following markers: CA-125 Results of the tumor marker test revealed 159   09/30/2019 Tumor Marker   Patient's tumor was tested for the following markers: CA-125 Results of the tumor marker test revealed 168   11/08/2019 Imaging   Outside CT imaging 1. Study was performed at outside institution on 11/08/2019 and presented for review on 11/14/2019.  2. Stable calcified and noncalcified left-sided nodular and plaque-like pleural abnormalities with small chronic left pleural effusion compatible with stable treated pleural metastasis. No new or enlarging lesions.  3. No enlarging thoracic lymph nodes.  4. Redemonstration of left breast mass. Correlation with mammography is suggested if not already performed   12/02/2019 Tumor Marker   Patient's tumor was tested for the following markers: CA-125 Results of the tumor marker test revealed 186   12/18/2019 Cancer Staging   Staging form: Ovary, Fallopian Tube, and Primary Peritoneal Carcinoma, AJCC 8th Edition - Clinical stage from 12/18/2019: FIGO Stage IV (rcT3c, cN0, cM1) - Signed by [name], MD on 04/24/2020   01/09/2020 Tumor Marker   Patient's tumor was tested for the following markers: CA-125 Results of the tumor marker test revealed 184  01/10/2020 Imaging   1. Treated left pleural and peritoneal metastatic disease. Areas of residual soft tissue prominence in the right anatomic pelvis are indeterminate. Comparison with prior exams would be helpful. 2. Hepatomegaly. Low-attenuation lesions in the liver are too small to characterize. Comparison with prior exams would be helpful. 3. Small left fibrothorax. 4. Infrarenal Aortic aneurysm NOS (ICD10-I71.9). Recommend followup by ultrasound in 2 years.  5. Aortic atherosclerosis (ICD10-I70.0). Coronary artery calcification.   03/09/2020 Tumor Marker   Patient's tumor was tested for the following markers:  CA-125 Results of the tumor marker test revealed 214   04/01/2020 Tumor Marker   Patient's tumor was tested for the following markers: CA-125 Results of the tumor marker test revealed 245.   04/03/2020 Imaging   1. No acute findings in the abdomen or pelvis. Specifically, no findings to explain the patient's history of left upper quadrant pain with nausea and vomiting. 2. Stable appearance of calcified omental and mesenteric nodules consistent with treated metastatic disease. Soft tissue fullness seen in the right adnexal region on the prior exam is similar today. 3. Stable 1.6 x 1.2 cm low-density lesion lateral segment left liver. 4. 3.5 cm infrarenal abdominal aortic aneurysm. Recommend follow-up ultrasound every 2 years. This recommendation follows ACR consensus guidelines: White Paper of the ACR Incidental Findings Committee II on Vascular Findings. J Am Coll Radiol 2013JB:[id number]. 5. Aortic Atherosclerosis (ICD10-I70.0).   04/15/2020 Imaging   Focus of abnormal uptake is seen involving the greater trochanter of the proximal left femur which corresponds to sclerotic density seen on prior CT scan and is concerning for metastatic disease.   04/23/2020 Imaging   1. No hip fracture, dislocation or avascular necrosis. 2. A 18 mm bone lesion in the peripheral superior aspect of the left greater trochanter with enhancement on postcontrast imaging. The overall appearance is most concerning for metastatic disease in the setting of known malignancy. 3. 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 4. Degenerative disease with disc height loss at L4-5 and L5-S1 with bilateral facet arthropathy.   06/08/2020 Tumor Marker   Patient's tumor was tested for the following markers: CA-125. Results of the tumor marker test revealed 272.   06/24/2020 Imaging   1. Overall progression of calcified and noncalcified peritoneal surface and omental lesions. 2. Slight progression of disease at the left lung base  and involving the pleura. 3. Stable sclerotic bone lesion involving the left greater trochanter. 4. Stable infrarenal abdominal aortic aneurysm.   07/02/2020 -  Chemotherapy   The patient had palonosetron (ALOXI) injection 0.25 mg, 0.25 mg, Intravenous,  Once, 0 of 3 cycles CARBOplatin (PARAPLATIN) 479.6 mg in sodium chloride 0.9 % 250 mL chemo infusion, 479.6 mg (100 % of original dose 479.6 mg), Intravenous,  Once, 0 of 3 cycles Dose modification:   (original dose 479.6 mg, Cycle 1) gemcitabine (GEMZAR) 1,672 mg in sodium chloride 0.9 % 250 mL chemo infusion, 800 mg/m2 = 1,672 mg, Intravenous,  Once, 0 of 3 cycles fosaprepitant (EMEND) 150 mg in sodium chloride 0.9 % 145 mL IVPB, 150 mg, Intravenous,  Once, 0 of 3 cycles  for chemotherapy treatment.      REVIEW OF SYSTEMS:   Constitutional: Denies fevers, chills or abnormal weight loss Eyes: Denies blurriness of vision Ears, nose, mouth, throat, and face: Denies mucositis or sore throat Respiratory: Denies cough, dyspnea or wheezes Cardiovascular: Denies palpitation, chest discomfort or lower extremity swelling Gastrointestinal:  Denies nausea, heartburn or change in bowel habits Skin: Denies abnormal skin  rashes All other systems were reviewed with the patient and are negative.  I have reviewed the past medical history, past surgical history, social history and family history with the patient and they are unchanged from previous note.  ALLERGIES:  is allergic to latex and sulfamethoxazole-trimethoprim.  MEDICATIONS:  Current Outpatient Medications  Medication Sig Dispense Refill  . ondansetron (ZOFRAN-ODT) 8 MG disintegrating tablet Take 1 tablet (8 mg total) by mouth every 8 (eight) hours as needed for nausea or vomiting. 60 tablet 1  . albuterol (VENTOLIN HFA) 108 (90 Base) MCG/ACT inhaler Inhale 2 puffs into the lungs as needed. 2 puffs into lungs every 6 hours as needed for wheezing or SOB    . aspirin 325 MG EC tablet Take 325  mg by mouth daily.    . clonazePAM (KLONOPIN) 1 MG tablet Take 1 mg by mouth 3 (three) times daily as needed for anxiety.    . cyclobenzaprine (FLEXERIL) 5 MG tablet TAKE 1 TABLET (5 MG TOTAL) BY MOUTH 3 (THREE) TIMES DAILY AS NEEDED. 60 tablet 1  . docusate sodium (COLACE) 100 MG capsule Take 100 mg by mouth 2 (two) times daily.    Marland Kitchen gabapentin (NEURONTIN) 300 MG capsule Take 3 capsules (900 mg total) by mouth 3 (three) times daily. 270 capsule 4  . hydrochlorothiazide (HYDRODIURIL) 25 MG tablet Take 25 mg by mouth daily.    Marland Kitchen HYDROmorphone (DILAUDID) 4 MG tablet Take 1 tablet (4 mg total) by mouth every 4 (four) hours as needed for severe pain. 90 tablet 0  . ibuprofen (ADVIL) 600 MG tablet Take 600 mg by mouth every 6 (six) hours as needed.    . lidocaine-prilocaine (EMLA) cream Apply 1 application topically as needed (port).     Marland Kitchen lisinopril-hydrochlorothiazide (ZESTORETIC) 20-25 MG tablet Take 1 tablet by mouth 2 (two) times daily.    Marland Kitchen MARINOL 5 MG capsule Take 5 mg by mouth 3 (three) times daily.    Marland Kitchen morphine (MS CONTIN) 30 MG 12 hr tablet Take 1 tablet (30 mg total) by mouth every 8 (eight) hours. 90 tablet 0  . naloxone (NARCAN) 2 MG/2ML injection Place 2 mg into the nose as needed.     Marland Kitchen OLANZapine (ZYPREXA) 10 MG tablet Take 10 mg by mouth at bedtime.    . prochlorperazine (COMPAZINE) 10 MG tablet Take 1 tablet (10 mg total) by mouth every 6 (six) hours as needed (Nausea or vomiting). 30 tablet 1  . rosuvastatin (CRESTOR) 40 MG tablet Take 40 mg by mouth daily.    . simethicone (MYLICON) 80 MG chewable tablet Chew 80 mg by mouth every 6 (six) hours as needed for flatulence.      No current facility-administered medications for this visit.   Facility-Administered Medications Ordered in Other Visits  Medication Dose Route Frequency Provider Last Rate Last Admin  . heparin lock flush 100 unit/mL  500 Units Intracatheter Once [name], MD        PHYSICAL EXAMINATION: ECOG  PERFORMANCE STATUS: 1 - Symptomatic but completely ambulatory  Vitals:   06/25/20 1311  BP: (!) 122/91  Pulse: 98  Resp: 18  Temp: 97.7 F (36.5 C)  SpO2: 97%   Filed Weights   06/25/20 1311  Weight: 196 lb 12.8 oz (89.3 kg)    GENERAL:alert, no distress and comfortable Musculoskeletal:no cyanosis of digits and no clubbing  NEURO: alert & oriented x 3 with fluent speech, no focal motor/sensory deficits.  She is very distressed and tearful  LABORATORY  DATA:  I have reviewed the data as listed    Component Value Date/Time   NA 135 06/08/2020 1025   K 4.1 06/08/2020 1025   CL 102 06/08/2020 1025   CO2 21 (L) 06/08/2020 1025   GLUCOSE 166 (H) 06/08/2020 1025   BUN 22 (H) 06/08/2020 1025   CREATININE 1.00 06/08/2020 1025   CREATININE 0.83 05/18/2020 1313   CALCIUM 9.3 06/08/2020 1025   PROT 6.6 06/08/2020 1025   ALBUMIN 3.7 06/08/2020 1025   AST 16 06/08/2020 1025   AST 21 05/18/2020 1313   ALT 27 06/08/2020 1025   ALT 33 05/18/2020 1313   ALKPHOS 70 06/08/2020 1025   BILITOT 0.3 06/08/2020 1025   BILITOT 0.6 05/18/2020 1313   GFRNONAA >60 06/08/2020 1025   GFRNONAA >60 05/18/2020 1313   GFRAA >60 03/09/2020 1005   GFRAA >60 01/09/2020 1404    No results found for: SPEP, UPEP  Lab Results  Component Value Date   WBC 11.7 (H) 06/08/2020   NEUTROABS 9.8 (H) 06/08/2020   HGB 14.0 06/08/2020   HCT 42.7 06/08/2020   MCV 90.1 06/08/2020   PLT 189 06/08/2020      Chemistry      Component Value Date/Time   NA 135 06/08/2020 1025   K 4.1 06/08/2020 1025   CL 102 06/08/2020 1025   CO2 21 (L) 06/08/2020 1025   BUN 22 (H) 06/08/2020 1025   CREATININE 1.00 06/08/2020 1025   CREATININE 0.83 05/18/2020 1313      Component Value Date/Time   CALCIUM 9.3 06/08/2020 1025   ALKPHOS 70 06/08/2020 1025   AST 16 06/08/2020 1025   AST 21 05/18/2020 1313   ALT 27 06/08/2020 1025   ALT 33 05/18/2020 1313   BILITOT 0.3 06/08/2020 1025   BILITOT 0.6 05/18/2020 1313        RADIOGRAPHIC STUDIES: I have personally reviewed the radiological images as listed and agreed with the findings in the report. CT ABDOMEN PELVIS W CONTRAST  Result Date: 06/24/2020 CLINICAL DATA:  Restaging ovarian cancer. EXAM: CT ABDOMEN AND PELVIS WITH CONTRAST TECHNIQUE: Multidetector CT imaging of the abdomen and pelvis was performed using the standard protocol following bolus administration of intravenous contrast. CONTRAST:  OMNIPAQUE IOHEXOL 300 MG/ML  SOLN COMPARISON:  CT scan 04/03/2020 FINDINGS: Lower chest: Stable appearing extensive calcified tumor at the left lung base mainly involving the pleural surfaces. Partially calcified epicardial lymph node on right measures 13 x 13 mm and previously measured 10.5 x 10.5 mm. The heart is normal in size. No pericardial effusion. Distal esophagus is grossly normal. Hepatobiliary: Numerous calcified peritoneal surface lesions around the liver appear relatively stable. There are also partially calcified lesions in the falciform ligament. The largest lesion measures 13 mm on image 27/2. It previously measured 11.5 mm. The hepatic and portal veins are patent. Gallbladder is surgically absent. Calcified lesion in the gallbladder fossa on image number 30/2 measures 11.5 mm and previously measured 9 mm. No common bile duct dilatation. Pancreas: No mass, inflammation or ductal dilatation. Spleen: Normal size. No intraparenchymal lesions. Numerous peritoneal surface lesions are again demonstrated. Adrenals/Urinary Tract: The adrenal glands and kidneys are unremarkable and stable. The bladder is grossly normal. Stomach/Bowel: The stomach, duodenum, small bowel and colon are grossly normal. No obstructive findings. Peritoneal surface lesions are again demonstrated. Vascular/Lymphatic: Stable advanced atherosclerotic calcifications involving the aorta and branch vessels. Stable 3.4 x 3.3 cm infrarenal abdominal aortic aneurysm. Diffuse omental and peritoneal  surface disease is  again demonstrated with numerous calcified and noncalcified nodules. Partially calcified omental lesion on image 22/2 near the stomach measures 23 x 19.5 mm and previously measured 16 x 11.5 mm. The lesion in the hepatoduodenal space on image 32/2 measures 16 x 13 mm. This previously measured 14 x 8 mm. Implant in the lower right pelvis anteriorly measures 40.5 x 15 mm on image 78/2. This previously measured 35 x 13 mm. Reproductive: Surgically absent. Other: No free air or free fluid collections. Musculoskeletal: Stable sclerotic lesion involving the left greater trochanter. A few smaller scattered sclerotic bone lesions are also stable. IMPRESSION: 1. Overall progression of calcified and noncalcified peritoneal surface and omental lesions. 2. Slight progression of disease at the left lung base and involving the pleura. 3. Stable sclerotic bone lesion involving the left greater trochanter. 4. Stable infrarenal abdominal aortic aneurysm. Aortic Atherosclerosis (ICD10-I70.0). Electronically Signed   By: Marijo Sanes M.D.   On: 06/24/2020 14:08

## 2020-06-25 NOTE — Progress Notes (Incomplete)
  Patient Name: Heidi Johnson MRN: [id number] DOB: [birth year] Referring Physician: Bertis Ruddy NI (Profile Not Attached) Date of Service: 05/20/2020 [facility] [place]                                                        End Of Treatment Note  Diagnoses: C79.51-Secondary malignant neoplasm of bone  Cancer Staging: Stage IV (rcT3c, cN0, cM1) ovarian cancer  Intent: Palliative  Radiation Treatment Dates: 05/06/2020 through 05/20/2020 Site Technique Total Dose (Gy) Dose per Fx (Gy) Completed Fx Beam Energies  Femur Left: Ext_Lt Complex 30/30 3 10/10 10X, 15X   Narrative: The patient tolerated radiation therapy relatively well. She did report vomiting and diarrhea with associated abdominal soreness and minimal appetite, but was drinking 8-10 bottles of water daily in addition to juice. She reported feeling better with treatment and had better pain management. She denied headache, dizziness, recent falls, and chest pain. Tingling in legs and hands was unchanged. No evidence of edema in the left lower leg on examination. She was ambulatory without assistance.  Plan: The patient will follow-up with radiation oncology in one month.  ________________________________________________   Billie Lade, PhD, MD  This document serves as a record of services personally performed by Antony Blackbird, MD. It was created on his behalf by Nikki Dom, a trained medical scribe. The creation of this record is based on the scribe's personal observations and the provider's statements to them. This document has been checked and approved by the attending provider.

## 2020-06-26 NOTE — Progress Notes (Signed)
Pharmacist Chemotherapy Monitoring - Initial Assessment    Anticipated start date: 07/03/20   Regimen:  . Are orders appropriate based on the patient's diagnosis, regimen, and cycle? Yes . Does the plan date match the patient's scheduled date? Yes . Is the sequencing of drugs appropriate? Yes . Are the premedications appropriate for the patient's regimen? Yes . Prior Authorization for treatment is: Approved o If applicable, is the correct biosimilar selected based on the patient's insurance? not applicable  Organ Function and Labs: Marland Kitchen Are dose adjustments needed based on the patient's renal function, hepatic function, or hematologic function? Yes . Are appropriate labs ordered prior to the start of patient's treatment? Yes . Other organ system assessment, if indicated: N/A . The following baseline labs, if indicated, have been ordered: N/A  Dose Assessment: . Are the drug doses appropriate? Yes . Are the following correct: o Drug concentrations Yes o IV fluid compatible with drug Yes o Administration routes Yes o Timing of therapy Yes . If applicable, does the patient have documented access for treatment and/or plans for port-a-cath placement? not applicable . If applicable, have lifetime cumulative doses been properly documented and assessed? not applicable Lifetime Dose Tracking  No doses have been documented on this patient for the following tracked chemicals: Doxorubicin, Epirubicin, Idarubicin, Daunorubicin, Mitoxantrone, Bleomycin, Oxaliplatin, Carboplatin, Liposomal Doxorubicin  o   Toxicity Monitoring/Prevention: . The patient has the following take home antiemetics prescribed: Prochlorperazine . The patient has the following take home medications prescribed: N/A . Medication allergies and previous infusion related reactions, if applicable, have been reviewed and addressed. Yes . The patient's current medication list has been assessed for drug-drug interactions with their  chemotherapy regimen. no significant drug-drug interactions were identified on review.  Order Review: . Are the treatment plan orders signed? Yes . Is the patient scheduled to see a provider prior to their treatment? Yes  I verify that I have reviewed each item in the above checklist and answered each question accordingly.  Heidi Johnson K 06/26/2020 11:39 AM

## 2020-07-01 ENCOUNTER — Telehealth: Payer: Self-pay | Admitting: Oncology

## 2020-07-01 NOTE — Telephone Encounter (Signed)
Called Heidi Johnson and discussed ovarian cancer staging - that stage IV is when the cancer is in the bones.  Advised that Dr. Alvy Bimler will talk to her more about this at her next appointment.  She also asked if she can have someone stay with her during infusion.  Advised her due to Covid protocols, visitors are not allowed in the infusion room but can stay in the lobby.  She verbalized understanding and agreement.

## 2020-07-03 ENCOUNTER — Inpatient Hospital Stay (HOSPITAL_BASED_OUTPATIENT_CLINIC_OR_DEPARTMENT_OTHER): Payer: Medicare Other | Admitting: Hematology and Oncology

## 2020-07-03 ENCOUNTER — Inpatient Hospital Stay: Payer: Medicare Other

## 2020-07-03 ENCOUNTER — Encounter: Payer: Self-pay | Admitting: Hematology and Oncology

## 2020-07-03 ENCOUNTER — Other Ambulatory Visit: Payer: Self-pay

## 2020-07-03 ENCOUNTER — Other Ambulatory Visit: Payer: Self-pay | Admitting: Hematology and Oncology

## 2020-07-03 VITALS — BP 129/74 | HR 119 | Temp 97.7°F | Resp 20 | Ht 69.0 in | Wt 199.4 lb

## 2020-07-03 DIAGNOSIS — I714 Abdominal aortic aneurysm, without rupture, unspecified: Secondary | ICD-10-CM

## 2020-07-03 DIAGNOSIS — K5909 Other constipation: Secondary | ICD-10-CM

## 2020-07-03 DIAGNOSIS — G894 Chronic pain syndrome: Secondary | ICD-10-CM

## 2020-07-03 DIAGNOSIS — C569 Malignant neoplasm of unspecified ovary: Secondary | ICD-10-CM

## 2020-07-03 DIAGNOSIS — Z7189 Other specified counseling: Secondary | ICD-10-CM

## 2020-07-03 LAB — CBC WITH DIFFERENTIAL/PLATELET
Abs Immature Granulocytes: 0.08 10*3/uL — ABNORMAL HIGH (ref 0.00–0.07)
Basophils Absolute: 0.1 10*3/uL (ref 0.0–0.1)
Basophils Relative: 1 %
Eosinophils Absolute: 0 10*3/uL (ref 0.0–0.5)
Eosinophils Relative: 0 %
HCT: 38.8 % (ref 36.0–46.0)
Hemoglobin: 13 g/dL (ref 12.0–15.0)
Immature Granulocytes: 1 %
Lymphocytes Relative: 23 %
Lymphs Abs: 2.1 10*3/uL (ref 0.7–4.0)
MCH: 30.1 pg (ref 26.0–34.0)
MCHC: 33.5 g/dL (ref 30.0–36.0)
MCV: 89.8 fL (ref 80.0–100.0)
Monocytes Absolute: 0.9 10*3/uL (ref 0.1–1.0)
Monocytes Relative: 10 %
Neutro Abs: 5.9 10*3/uL (ref 1.7–7.7)
Neutrophils Relative %: 65 %
Platelets: 175 10*3/uL (ref 150–400)
RBC: 4.32 MIL/uL (ref 3.87–5.11)
RDW: 17.8 % — ABNORMAL HIGH (ref 11.5–15.5)
WBC: 9.1 10*3/uL (ref 4.0–10.5)
nRBC: 0 % (ref 0.0–0.2)

## 2020-07-03 LAB — COMPREHENSIVE METABOLIC PANEL
ALT: 38 U/L (ref 0–44)
AST: 20 U/L (ref 15–41)
Albumin: 3.3 g/dL — ABNORMAL LOW (ref 3.5–5.0)
Alkaline Phosphatase: 100 U/L (ref 38–126)
Anion gap: 9 (ref 5–15)
BUN: 15 mg/dL (ref 6–20)
CO2: 25 mmol/L (ref 22–32)
Calcium: 8.7 mg/dL — ABNORMAL LOW (ref 8.9–10.3)
Chloride: 100 mmol/L (ref 98–111)
Creatinine, Ser: 0.7 mg/dL (ref 0.44–1.00)
GFR, Estimated: >60 mL/min (ref >60)
Glucose, Bld: 114 mg/dL — ABNORMAL HIGH (ref 70–99)
Potassium: 3.6 mmol/L (ref 3.5–5.1)
Sodium: 134 mmol/L — ABNORMAL LOW (ref 135–145)
Total Bilirubin: 0.7 mg/dL (ref 0.3–1.2)
Total Protein: 6.5 g/dL (ref 6.5–8.1)

## 2020-07-03 MED ORDER — CARBOPLATIN CHEMO INJECTION 600 MG/60ML
574.4000 mg | Freq: Once | INTRAVENOUS | Status: AC
  Administered 2020-07-03: 570 mg via INTRAVENOUS
  Filled 2020-07-03: qty 57

## 2020-07-03 MED ORDER — HEPARIN SOD (PORK) LOCK FLUSH 100 UNIT/ML IV SOLN
500.0000 [IU] | Freq: Once | INTRAVENOUS | Status: AC | PRN
  Administered 2020-07-03: 500 [IU]
  Filled 2020-07-03: qty 5

## 2020-07-03 MED ORDER — SODIUM CHLORIDE 0.9 % IV SOLN
Freq: Once | INTRAVENOUS | Status: AC
  Filled 2020-07-03: qty 250

## 2020-07-03 MED ORDER — PALONOSETRON HCL INJECTION 0.25 MG/5ML
0.2500 mg | Freq: Once | INTRAVENOUS | Status: AC
  Administered 2020-07-03: 0.25 mg via INTRAVENOUS

## 2020-07-03 MED ORDER — SODIUM CHLORIDE 0.9% FLUSH
10.0000 mL | INTRAVENOUS | Status: DC | PRN
  Administered 2020-07-03: 10 mL
  Filled 2020-07-03: qty 10

## 2020-07-03 MED ORDER — SODIUM CHLORIDE 0.9 % IV SOLN
800.0000 mg/m2 | Freq: Once | INTRAVENOUS | Status: AC
  Administered 2020-07-03: 1672 mg via INTRAVENOUS
  Filled 2020-07-03: qty 43.97

## 2020-07-03 MED ORDER — PALONOSETRON HCL INJECTION 0.25 MG/5ML
INTRAVENOUS | Status: AC
  Filled 2020-07-03: qty 5

## 2020-07-03 MED ORDER — ALTEPLASE 2 MG IJ SOLR
INTRAMUSCULAR | Status: AC
  Filled 2020-07-03: qty 2

## 2020-07-03 MED ORDER — SODIUM CHLORIDE 0.9% FLUSH
10.0000 mL | Freq: Once | INTRAVENOUS | Status: AC
  Administered 2020-07-03: 10 mL
  Filled 2020-07-03: qty 10

## 2020-07-03 MED ORDER — ALTEPLASE 2 MG IJ SOLR
2.0000 mg | Freq: Once | INTRAMUSCULAR | Status: AC
Start: 2020-07-03 — End: 2020-07-03
  Administered 2020-07-03: 2 mg
  Filled 2020-07-03: qty 2

## 2020-07-03 MED ORDER — SODIUM CHLORIDE 0.9 % IV SOLN
10.0000 mg | Freq: Once | INTRAVENOUS | Status: AC
  Administered 2020-07-03: 10 mg via INTRAVENOUS
  Filled 2020-07-03: qty 10

## 2020-07-03 MED ORDER — SODIUM CHLORIDE 0.9 % IV SOLN
150.0000 mg | Freq: Once | INTRAVENOUS | Status: AC
  Administered 2020-07-03: 150 mg via INTRAVENOUS
  Filled 2020-07-03: qty 150

## 2020-07-03 NOTE — Assessment & Plan Note (Signed)
She is noted to have hypocalcemia I will check vitamin D level

## 2020-07-03 NOTE — Patient Instructions (Signed)

## 2020-07-03 NOTE — Assessment & Plan Note (Addendum)
She has multifactorial pain, related to prior neuropathy, chronic pain syndrome and undisclosed cause of her severe diffuse pain Recently, I have increased the dose of MS Contin to 30 mg 3 times a day along with Dilaudid 4 mg as needed I plan to increase MS Contin again next week if her pain is still poorly controlled despite chemotherapy We discussed narcotic refill policy and warned about risk of sedation and constipation I plan to check vitamin D level to see if there is concurrent vitamin D deficiency that could cause exacerbation of her chronic bone

## 2020-07-03 NOTE — Assessment & Plan Note (Signed)
Per previous discussion, we will proceed with carboplatin and gemcitabine I did not add bevacizumab because she had prior poor tolerance of bevacizumab Due to her significant health issues and difficulties with pain management, I plan to see her every week

## 2020-07-03 NOTE — Assessment & Plan Note (Signed)
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

## 2020-07-03 NOTE — Progress Notes (Signed)
Pharmacist Chemotherapy Monitoring - Initial Assessment    Anticipated start date: 07/10/20   Regimen:  . Are orders appropriate based on the patient's diagnosis, regimen, and cycle? Yes . Does the plan date match the patient's scheduled date? Yes . Is the sequencing of drugs appropriate? Yes . Are the premedications appropriate for the patient's regimen? Yes . Prior Authorization for treatment is: Approved o If applicable, is the correct biosimilar selected based on the patient's insurance? not applicable  Organ Function and Labs: Marland Kitchen Are dose adjustments needed based on the patient's renal function, hepatic function, or hematologic function? Yes . Are appropriate labs ordered prior to the start of patient's treatment? Yes . Other organ system assessment, if indicated: N/A . The following baseline labs, if indicated, have been ordered: N/A  Dose Assessment: . Are the drug doses appropriate? Yes . Are the following correct: o Drug concentrations Yes o IV fluid compatible with drug Yes o Administration routes Yes o Timing of therapy Yes . If applicable, does the patient have documented access for treatment and/or plans for port-a-cath placement? not applicable . If applicable, have lifetime cumulative doses been properly documented and assessed? not applicable Lifetime Dose Tracking  No doses have been documented on this patient for the following tracked chemicals: Doxorubicin, Epirubicin, Idarubicin, Daunorubicin, Mitoxantrone, Bleomycin, Oxaliplatin, Carboplatin, Liposomal Doxorubicin  o   Toxicity Monitoring/Prevention: . The patient has the following take home antiemetics prescribed: Prochlorperazine . The patient has the following take home medications prescribed: N/A . Medication allergies and previous infusion related reactions, if applicable, have been reviewed and addressed. Yes . The patient's current medication list has been assessed for drug-drug interactions with their  chemotherapy regimen. no significant drug-drug interactions were identified on review.  Order Review: . Are the treatment plan orders signed? Yes . Is the patient scheduled to see a provider prior to their treatment? Yes  I verify that I have reviewed each item in the above checklist and answered each question accordingly.  Heidi Johnson K 07/03/2020 2:32 PM

## 2020-07-03 NOTE — Assessment & Plan Note (Signed)
We discussed the importance of aggressive laxative therapy 

## 2020-07-03 NOTE — Progress Notes (Signed)
Okay to treat with heart rate of 119 today, per Dr. Alvy Bimler.

## 2020-07-03 NOTE — Patient Instructions (Signed)
[facility] Discharge Instructions for Patients Receiving Chemotherapy  Today you received the following chemotherapy agents: Gemcitabine (Gemzar) and Carboplatin.  To help prevent nausea and vomiting after your treatment, we encourage you to take your nausea medication as directed by your MD.   If you develop nausea and vomiting that is not controlled by your nausea medication, call the clinic.   BELOW ARE SYMPTOMS THAT SHOULD BE REPORTED IMMEDIATELY:  *FEVER GREATER THAN 100.5 F  *CHILLS WITH OR WITHOUT FEVER  NAUSEA AND VOMITING THAT IS NOT CONTROLLED WITH YOUR NAUSEA MEDICATION  *UNUSUAL SHORTNESS OF BREATH  *UNUSUAL BRUISING OR BLEEDING  TENDERNESS IN MOUTH AND THROAT WITH OR WITHOUT PRESENCE OF ULCERS  *URINARY PROBLEMS  *BOWEL PROBLEMS  UNUSUAL RASH Items with * indicate a potential emergency and should be followed up as soon as possible.  Feel free to call the clinic should you have any questions or concerns. The clinic phone number is [phone].  Please show the [facility] at check-in to the Emergency Department and triage nurse.  Gemcitabine injection What is this medicine? GEMCITABINE (jem SYE ta been) is a chemotherapy drug. This medicine is used to treat many types of cancer like breast cancer, lung cancer, pancreatic cancer, and ovarian cancer. This medicine may be used for other purposes; ask your health care provider or pharmacist if you have questions. COMMON BRAND NAME(S): Gemzar, Infugem What should I tell my health care provider before I take this medicine? They need to know if you have any of these conditions:  blood disorders  infection  kidney disease  liver disease  lung or breathing disease, like asthma  recent or ongoing radiation therapy  an unusual or allergic reaction to gemcitabine, other chemotherapy, other medicines, foods, dyes, or preservatives  pregnant or trying to get pregnant  breast-feeding How  should I use this medicine? This drug is given as an infusion into a vein. It is administered in a hospital or clinic by a specially trained health care professional. Talk to your pediatrician regarding the use of this medicine in children. Special care may be needed. Overdosage: If you think you have taken too much of this medicine contact a poison control center or emergency room at once. NOTE: This medicine is only for you. Do not share this medicine with others. What if I miss a dose? It is important not to miss your dose. Call your doctor or health care professional if you are unable to keep an appointment. What may interact with this medicine?  medicines to increase blood counts like filgrastim, pegfilgrastim, sargramostim  some other chemotherapy drugs like cisplatin  vaccines Talk to your doctor or health care professional before taking any of these medicines:  acetaminophen  aspirin  ibuprofen  ketoprofen  naproxen This list may not describe all possible interactions. Give your health care provider a list of all the medicines, herbs, non-prescription drugs, or dietary supplements you use. Also tell them if you smoke, drink alcohol, or use illegal drugs. Some items may interact with your medicine. What should I watch for while using this medicine? Visit your doctor for checks on your progress. This drug may make you feel generally unwell. This is not uncommon, as chemotherapy can affect healthy cells as well as cancer cells. Report any side effects. Continue your course of treatment even though you feel ill unless your doctor tells you to stop. In some cases, you may be given additional medicines to help with side effects. Follow  all directions for their use. Call your doctor or health care professional for advice if you get a fever, chills or sore throat, or other symptoms of a cold or flu. Do not treat yourself. This drug decreases your body's ability to fight infections. Try  to avoid being around people who are sick. This medicine may increase your risk to bruise or bleed. Call your doctor or health care professional if you notice any unusual bleeding. Be careful brushing and flossing your teeth or using a toothpick because you may get an infection or bleed more easily. If you have any dental work done, tell your dentist you are receiving this medicine. Avoid taking products that contain aspirin, acetaminophen, ibuprofen, naproxen, or ketoprofen unless instructed by your doctor. These medicines may hide a fever. Do not become pregnant while taking this medicine or for 6 months after stopping it. Women should inform their doctor if they wish to become pregnant or think they might be pregnant. Men should not father a child while taking this medicine and for 3 months after stopping it. There is a potential for serious side effects to an unborn child. Talk to your health care professional or pharmacist for more information. Do not breast-feed an infant while taking this medicine or for at least 1 week after stopping it. Men should inform their doctors if they wish to father a child. This medicine may lower sperm counts. Talk with your doctor or health care professional if you are concerned about your fertility. What side effects may I notice from receiving this medicine? Side effects that you should report to your doctor or health care professional as soon as possible:  allergic reactions like skin rash, itching or hives, swelling of the face, lips, or tongue  breathing problems  pain, redness, or irritation at site where injected  signs and symptoms of a dangerous change in heartbeat or heart rhythm like chest pain; dizziness; fast or irregular heartbeat; palpitations; feeling faint or lightheaded, falls; breathing problems  signs of decreased platelets or bleeding - bruising, pinpoint red spots on the skin, black, tarry stools, blood in the urine  signs of decreased red  blood cells - unusually weak or tired, feeling faint or lightheaded, falls  signs of infection - fever or chills, cough, sore throat, pain or difficulty passing urine  signs and symptoms of kidney injury like trouble passing urine or change in the amount of urine  signs and symptoms of liver injury like dark yellow or brown urine; general ill feeling or flu-like symptoms; light-colored stools; loss of appetite; nausea; right upper belly pain; unusually weak or tired; yellowing of the eyes or skin  swelling of ankles, feet, hands Side effects that usually do not require medical attention (report to your doctor or health care professional if they continue or are bothersome):  constipation  diarrhea  hair loss  loss of appetite  nausea  rash  vomiting This list may not describe all possible side effects. Call your doctor for medical advice about side effects. You may report side effects to FDA at 1-800-FDA-1088. Where should I keep my medicine? This drug is given in a hospital or clinic and will not be stored at home. NOTE: This sheet is a summary. It may not cover all possible information. If you have questions about this medicine, talk to your doctor, pharmacist, or health care provider.  2021 [employer] Standard (2017-08-30 18:06:11)  Carboplatin injection What is this medicine? CARBOPLATIN (KAR boe pla tin) is a chemotherapy  drug. It targets fast dividing cells, like cancer cells, and causes these cells to die. This medicine is used to treat ovarian cancer and many other cancers. This medicine may be used for other purposes; ask your health care provider or pharmacist if you have questions. COMMON BRAND NAME(S): Paraplatin What should I tell my health care provider before I take this medicine? They need to know if you have any of these conditions:  blood disorders  hearing problems  kidney disease  recent or ongoing radiation therapy  an unusual or allergic reaction  to carboplatin, cisplatin, other chemotherapy, other medicines, foods, dyes, or preservatives  pregnant or trying to get pregnant  breast-feeding How should I use this medicine? This drug is usually given as an infusion into a vein. It is administered in a hospital or clinic by a specially trained health care professional. Talk to your pediatrician regarding the use of this medicine in children. Special care may be needed. Overdosage: If you think you have taken too much of this medicine contact a poison control center or emergency room at once. NOTE: This medicine is only for you. Do not share this medicine with others. What if I miss a dose? It is important not to miss a dose. Call your doctor or health care professional if you are unable to keep an appointment. What may interact with this medicine?  medicines for seizures  medicines to increase blood counts like filgrastim, pegfilgrastim, sargramostim  some antibiotics like amikacin, gentamicin, neomycin, streptomycin, tobramycin  vaccines Talk to your doctor or health care professional before taking any of these medicines:  acetaminophen  aspirin  ibuprofen  ketoprofen  naproxen This list may not describe all possible interactions. Give your health care provider a list of all the medicines, herbs, non-prescription drugs, or dietary supplements you use. Also tell them if you smoke, drink alcohol, or use illegal drugs. Some items may interact with your medicine. What should I watch for while using this medicine? Your condition will be monitored carefully while you are receiving this medicine. You will need important blood work done while you are taking this medicine. This drug may make you feel generally unwell. This is not uncommon, as chemotherapy can affect healthy cells as well as cancer cells. Report any side effects. Continue your course of treatment even though you feel ill unless your doctor tells you to stop. In some  cases, you may be given additional medicines to help with side effects. Follow all directions for their use. Call your doctor or health care professional for advice if you get a fever, chills or sore throat, or other symptoms of a cold or flu. Do not treat yourself. This drug decreases your body's ability to fight infections. Try to avoid being around people who are sick. This medicine may increase your risk to bruise or bleed. Call your doctor or health care professional if you notice any unusual bleeding. Be careful brushing and flossing your teeth or using a toothpick because you may get an infection or bleed more easily. If you have any dental work done, tell your dentist you are receiving this medicine. Avoid taking products that contain aspirin, acetaminophen, ibuprofen, naproxen, or ketoprofen unless instructed by your doctor. These medicines may hide a fever. Do not become pregnant while taking this medicine. Women should inform their doctor if they wish to become pregnant or think they might be pregnant. There is a potential for serious side effects to an unborn child. Talk to your health  care professional or pharmacist for more information. Do not breast-feed an infant while taking this medicine. What side effects may I notice from receiving this medicine? Side effects that you should report to your doctor or health care professional as soon as possible:  allergic reactions like skin rash, itching or hives, swelling of the face, lips, or tongue  signs of infection - fever or chills, cough, sore throat, pain or difficulty passing urine  signs of decreased platelets or bleeding - bruising, pinpoint red spots on the skin, black, tarry stools, nosebleeds  signs of decreased red blood cells - unusually weak or tired, fainting spells, lightheadedness  breathing problems  changes in hearing  changes in vision  chest pain  high blood pressure  low blood counts - This drug may decrease  the number of white blood cells, red blood cells and platelets. You may be at increased risk for infections and bleeding.  nausea and vomiting  pain, swelling, redness or irritation at the injection site  pain, tingling, numbness in the hands or feet  problems with balance, talking, walking  trouble passing urine or change in the amount of urine Side effects that usually do not require medical attention (report to your doctor or health care professional if they continue or are bothersome):  hair loss  loss of appetite  metallic taste in the mouth or changes in taste This list may not describe all possible side effects. Call your doctor for medical advice about side effects. You may report side effects to FDA at 1-800-FDA-1088. Where should I keep my medicine? This drug is given in a hospital or clinic and will not be stored at home. NOTE: This sheet is a summary. It may not cover all possible information. If you have questions about this medicine, talk to your doctor, pharmacist, or health care provider.  2021 [employer]/Gold Standard (2007-09-11 14:38:05)

## 2020-07-03 NOTE — Progress Notes (Signed)
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, chronic pain syndrome and undisclosed cause of her severe diffuse pain Recently, I have increased the dose of MS Contin to 30 mg 3 times a day along with Dilaudid 4 mg as needed I plan to increase MS Contin again next week if her pain is still poorly controlled despite chemotherapy We discussed narcotic refill policy and warned about risk of sedation and constipation I plan to check vitamin D level to see if there is concurrent vitamin D deficiency that could cause exacerbation of her chronic bone  Other constipation We discussed the importance of aggressive laxative therapy  Hypocalcemia She is noted to have hypocalcemia I will check vitamin D level  Goals of care, counseling/discussion We had numerous goals of care discussions in the past Social worker is present to help with her social issues She is aware that treatment goal is palliative given his stage IV disease   Orders Placed This Encounter  Procedures  . VITAMIN D 25 Hydroxy (Vit-D Deficiency, Fractures)    Standing Status:   Future    Standing Expiration Date:   07/03/2021    All questions were answered. The patient knows to call the clinic with any problems, questions or concerns. The total time spent in the appointment was 30 minutes encounter with patients including review of chart and various tests results, discussions about plan of care and coordination of care plan   [name], MD 07/03/2020 3:03 PM  INTERVAL HISTORY: Please see below for  problem oriented charting. She returns with her best friend for further follow-up She continues to reiterate her poor social circumstances Her children have not been present to support her Social worker is present today to discuss potential assistance to help her She is undergoing physical therapy She continues to complain of severe neuropathic pain and diffuse bone pain She denies recent nausea or constipation  SUMMARY OF ONCOLOGIC HISTORY: Oncology History Overview Note  Negative genetics at [facility]   Ovarian cancer ([place])  04/25/2016 Imaging   CT confirms 12cm ovarian masses with extensive peritoneal metastases and malignant pleural effusions   05/05/2016 Surgery   s/p ex-lap posterior exenteration, diaphragm stripping, cholecystectomy, omentectomy, appendectomy and optimal tumor debulking on 05/05/16   06/24/2016 - 10/24/2016 Chemotherapy   Started Taxol/Carboplatin. 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  Numerous left predominant calcified pleural metastases and associated small left pleural effusion are unchanged.  2.  Unchanged (3.0 cm) (series 3, image 96) left breast mass    02/07/2019 Imaging   Outside Ct abdomen and pelvis 1. Overall stable small volume peritoneal tumor, as described above.  2. Unchanged infrarenal abdominal aortic  aneurysm.    02/22/2019 Tumor Marker   Patient's tumor was tested for the following markers: CA-125 Results of the tumor marker test revealed 164   03/18/2019 Tumor Marker   Patient's tumor was tested for the following markers:  CA-125 Results of the tumor marker test revealed 162   04/08/2019 Tumor Marker   Patient's tumor was tested for the following markers: CA-125 Results of the tumor marker test revealed 164   04/29/2019 Tumor Marker   Patient's tumor was tested for the following markers: CA-125 Results of the tumor marker test revealed 181   05/20/2019 Tumor Marker   Patient's tumor was tested for the following markers: CA-125 Results of the tumor marker test revealed 164   06/10/2019 Tumor Marker   Patient's tumor was tested for the following markers: CA-125 Results of the tumor marker test revealed 152   07/01/2019 Tumor Marker   Patient's tumor was tested for the following markers: CA-125 Results of the tumor marker test revealed 160   07/22/2019 Tumor Marker   Patient's tumor was tested for the following markers: CA-125 Results of the tumor marker test revealed 171   08/12/2019 Tumor Marker   Patient's tumor was tested for the following markers: CA-125 Results of the tumor marker test revealed 173   09/02/2019 Tumor Marker   Patient's tumor was tested for the following markers: CA-125 Results of the tumor marker test revealed 159   09/30/2019 Tumor Marker   Patient's tumor was tested for the following markers: CA-125 Results of the tumor marker test revealed 168   11/08/2019 Imaging   Outside CT imaging 1. Study was performed at outside institution on 11/08/2019 and presented for review on 11/14/2019.  2. Stable calcified and noncalcified left-sided nodular and plaque-like pleural abnormalities with small chronic left pleural effusion compatible with stable treated pleural metastasis. No new or enlarging lesions.  3. No enlarging thoracic lymph nodes.  4. Redemonstration of left breast mass. Correlation with mammography is suggested if not already performed   12/02/2019 Tumor Marker   Patient's tumor was tested for the following markers: CA-125 Results of the tumor marker test revealed 186    12/18/2019 Cancer Staging   Staging form: Ovary, Fallopian Tube, and Primary Peritoneal Carcinoma, AJCC 8th Edition - Clinical stage from 12/18/2019: FIGO Stage IV (rcT3c, cN0, cM1) - Signed by [name], MD on 04/24/2020   01/09/2020 Tumor Marker   Patient's tumor was tested for the following markers: CA-125 Results of the tumor marker test revealed 184   01/10/2020 Imaging   1. Treated left pleural and peritoneal metastatic disease. Areas of residual soft tissue prominence in the right anatomic pelvis are indeterminate. Comparison with prior exams would be helpful. 2. Hepatomegaly. Low-attenuation lesions in the liver are too small to characterize. Comparison with prior exams would be helpful. 3. Small left fibrothorax. 4. Infrarenal Aortic aneurysm NOS (ICD10-I71.9). Recommend followup by ultrasound in 2 years.  5. Aortic atherosclerosis (ICD10-I70.0). Coronary artery calcification.   03/09/2020 Tumor Marker   Patient's tumor was tested for the following markers: CA-125 Results of the tumor marker test revealed 214   04/01/2020 Tumor Marker   Patient's tumor was tested for the following markers: CA-125 Results of the tumor marker test revealed 245.   04/03/2020 Imaging   1. No acute findings in the abdomen or pelvis. Specifically, no findings to explain the patient's history of left upper quadrant pain with nausea and vomiting. 2. Stable appearance of calcified omental and mesenteric nodules consistent with treated  metastatic disease. Soft tissue fullness seen in the right adnexal region on the prior exam is similar today. 3. Stable 1.6 x 1.2 cm low-density lesion lateral segment left liver. 4. 3.5 cm infrarenal abdominal aortic aneurysm. Recommend follow-up ultrasound every 2 years. This recommendation follows ACR consensus guidelines: White Paper of the ACR Incidental Findings Committee II on Vascular Findings. J Am Coll Radiol 2013JB:6262728. 5. Aortic Atherosclerosis  (ICD10-I70.0).   04/15/2020 Imaging   Focus of abnormal uptake is seen involving the greater trochanter of the proximal left femur which corresponds to sclerotic density seen on prior CT scan and is concerning for metastatic disease.   04/23/2020 Imaging   1. No hip fracture, dislocation or avascular necrosis. 2. A 18 mm bone lesion in the peripheral superior aspect of the left greater trochanter with enhancement on postcontrast imaging. The overall appearance is most concerning for metastatic disease in the setting of known malignancy. 3. 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 4. Degenerative disease with disc height loss at L4-5 and L5-S1 with bilateral facet arthropathy.   06/08/2020 Tumor Marker   Patient's tumor was tested for the following markers: CA-125. Results of the tumor marker test revealed 272.   06/24/2020 Imaging   1. Overall progression of calcified and noncalcified peritoneal surface and omental lesions. 2. Slight progression of disease at the left lung base and involving the pleura. 3. Stable sclerotic bone lesion involving the left greater trochanter. 4. Stable infrarenal abdominal aortic aneurysm.   07/10/2020 -  Chemotherapy    Patient is on Treatment Plan: OVARIAN RECURRENT 3RD LINE CARBOPLATIN D1 / GEMCITABINE D1,8 (4/800) Q21D        REVIEW OF SYSTEMS:   Constitutional: Denies fevers, chills or abnormal weight loss Eyes: Denies blurriness of vision Ears, nose, mouth, throat, and face: Denies mucositis or sore throat Respiratory: Denies cough, dyspnea or wheezes Cardiovascular: Denies palpitation, chest discomfort or lower extremity swelling Gastrointestinal:  Denies nausea, heartburn or change in bowel habits Skin: Denies abnormal skin rashes Lymphatics: Denies new lymphadenopathy or easy bruising Behavioral/Psych: Mood is stable, no new changes  All other systems were reviewed with the patient and are negative.  I have reviewed the past medical  history, past surgical history, social history and family history with the patient and they are unchanged from previous note.  ALLERGIES:  is allergic to latex and sulfamethoxazole-trimethoprim.  MEDICATIONS:  Current Outpatient Medications  Medication Sig Dispense Refill  . albuterol (VENTOLIN HFA) 108 (90 Base) MCG/ACT inhaler Inhale 2 puffs into the lungs as needed. 2 puffs into lungs every 6 hours as needed for wheezing or SOB    . aspirin 325 MG EC tablet Take 325 mg by mouth daily.    . clonazePAM (KLONOPIN) 1 MG tablet Take 1 mg by mouth 3 (three) times daily as needed for anxiety.    . cyclobenzaprine (FLEXERIL) 5 MG tablet TAKE 1 TABLET (5 MG TOTAL) BY MOUTH 3 (THREE) TIMES DAILY AS NEEDED. 60 tablet 1  . docusate sodium (COLACE) 100 MG capsule Take 100 mg by mouth 2 (two) times daily.    Marland Kitchen gabapentin (NEURONTIN) 300 MG capsule Take 3 capsules (900 mg total) by mouth 3 (three) times daily. 270 capsule 4  . hydrochlorothiazide (HYDRODIURIL) 25 MG tablet Take 25 mg by mouth daily.    Marland Kitchen HYDROmorphone (DILAUDID) 4 MG tablet Take 1 tablet (4 mg total) by mouth every 4 (four) hours as needed for severe pain. 90 tablet 0  . ibuprofen (ADVIL) 600  MG tablet Take 600 mg by mouth every 6 (six) hours as needed.    . lidocaine-prilocaine (EMLA) cream Apply 1 application topically as needed (port).     Marland Kitchen lisinopril-hydrochlorothiazide (ZESTORETIC) 20-25 MG tablet Take 1 tablet by mouth 2 (two) times daily.    Marland Kitchen MARINOL 5 MG capsule Take 5 mg by mouth 3 (three) times daily.    Marland Kitchen morphine (MS CONTIN) 30 MG 12 hr tablet Take 1 tablet (30 mg total) by mouth every 8 (eight) hours. 90 tablet 0  . naloxone (NARCAN) 2 MG/2ML injection Place 2 mg into the nose as needed.     Marland Kitchen OLANZapine (ZYPREXA) 10 MG tablet Take 10 mg by mouth at bedtime.    . ondansetron (ZOFRAN-ODT) 8 MG disintegrating tablet Take 1 tablet (8 mg total) by mouth every 8 (eight) hours as needed for nausea or vomiting. 60 tablet 1  .  prochlorperazine (COMPAZINE) 10 MG tablet Take 1 tablet (10 mg total) by mouth every 6 (six) hours as needed (Nausea or vomiting). 30 tablet 1  . rosuvastatin (CRESTOR) 40 MG tablet Take 40 mg by mouth daily.    . simethicone (MYLICON) 80 MG chewable tablet Chew 80 mg by mouth every 6 (six) hours as needed for flatulence.      No current facility-administered medications for this visit.   Facility-Administered Medications Ordered in Other Visits  Medication Dose Route Frequency Provider Last Rate Last Admin  . heparin lock flush 100 unit/mL  500 Units Intracatheter Once Alvy Bimler, Walaa Carel, MD        PHYSICAL EXAMINATION: ECOG PERFORMANCE STATUS: 1 - Symptomatic but completely ambulatory  Vitals:   07/03/20 1354  BP: 129/74  Pulse: (!) 119  Resp: 20  Temp: 97.7 F (36.5 C)  SpO2: 97%   Filed Weights   07/03/20 1354  Weight: 199 lb 6.4 oz (90.4 kg)    GENERAL:alert, no distress and comfortable NEURO: alert & oriented x 3 with fluent speech, no focal motor/sensory deficits  LABORATORY DATA:  I have reviewed the data as listed    Component Value Date/Time   NA 134 (L) 07/03/2020 1348   K 3.6 07/03/2020 1348   CL 100 07/03/2020 1348   CO2 25 07/03/2020 1348   GLUCOSE 114 (H) 07/03/2020 1348   BUN 15 07/03/2020 1348   CREATININE 0.70 07/03/2020 1348   CREATININE 0.83 05/18/2020 1313   CALCIUM 8.7 (L) 07/03/2020 1348   PROT 6.5 07/03/2020 1348   ALBUMIN 3.3 (L) 07/03/2020 1348   AST 20 07/03/2020 1348   AST 21 05/18/2020 1313   ALT 38 07/03/2020 1348   ALT 33 05/18/2020 1313   ALKPHOS 100 07/03/2020 1348   BILITOT 0.7 07/03/2020 1348   BILITOT 0.6 05/18/2020 1313   GFRNONAA >60 07/03/2020 1348   GFRNONAA >60 05/18/2020 1313   GFRAA >60 03/09/2020 1005   GFRAA >60 01/09/2020 1404    No results found for: SPEP, UPEP  Lab Results  Component Value Date   WBC 9.1 07/03/2020   NEUTROABS 5.9 07/03/2020   HGB 13.0 07/03/2020   HCT 38.8 07/03/2020   MCV 89.8 07/03/2020    PLT 175 07/03/2020      Chemistry      Component Value Date/Time   NA 134 (L) 07/03/2020 1348   K 3.6 07/03/2020 1348   CL 100 07/03/2020 1348   CO2 25 07/03/2020 1348   BUN 15 07/03/2020 1348   CREATININE 0.70 07/03/2020 1348   CREATININE 0.83 05/18/2020 1313  Component Value Date/Time   CALCIUM 8.7 (L) 07/03/2020 1348   ALKPHOS 100 07/03/2020 1348   AST 20 07/03/2020 1348   AST 21 05/18/2020 1313   ALT 38 07/03/2020 1348   ALT 33 05/18/2020 1313   BILITOT 0.7 07/03/2020 1348   BILITOT 0.6 05/18/2020 1313

## 2020-07-04 LAB — CA 125: Cancer Antigen (CA) 125: 284 U/mL — ABNORMAL HIGH (ref 0.0–38.1)

## 2020-07-06 ENCOUNTER — Telehealth: Payer: Self-pay | Admitting: *Deleted

## 2020-07-06 NOTE — Telephone Encounter (Signed)
-----   Message from Rolene Course, RN sent at 07/03/2020  6:27 PM EST ----- Regarding: Heidi Johnson 1st Tx F/U call - gemzar, [name] 1st Tx F/U call - gemzar, [name]

## 2020-07-06 NOTE — Telephone Encounter (Signed)
Called pt to check on her after her chemo treatment last week.  She reports having a rough first night with nausea/vomiting & next two nights were tolerable.  She is taking her compazine/zofran. She doesn't think she took anything for nausea first hs.  Discussed trying that with next treatment even if not nauseated before bed.  She expressed understanding.  She was also concerned about port & trouble getting blood return & having to start a peripheral IV.  She reports some bruising & knot at IV site.  Discussed possibilities with port problem & informed to try some warm compresses to IV site.  Will see if port gives problem this week. She knows how to reach [place] if concerns.

## 2020-07-08 ENCOUNTER — Other Ambulatory Visit: Payer: Self-pay | Admitting: Hematology and Oncology

## 2020-07-08 ENCOUNTER — Telehealth: Payer: Self-pay | Admitting: Oncology

## 2020-07-08 MED ORDER — MORPHINE SULFATE ER 30 MG PO TBCR: 30.0000 mg | EXTENDED_RELEASE_TABLET | Freq: Three times a day (TID) | ORAL | 0 refills | Status: DC

## 2020-07-08 NOTE — Telephone Encounter (Signed)
Refill sent.

## 2020-07-08 NOTE — Telephone Encounter (Signed)
Heidi Johnson called and requested a refill of MS Contin 30 mg to be sent to the [facility] in [place].  She has 1 tablet left.  She also mentioned that she is having more problems with constipation and is taking miralax and Senakot.  She said her daughter sent her an enema and she used it last night and is now having bowel movements.  Advised her to keep taking miralax and Senakot but to not use an enema again until she talks to Dr. Alvy Bimler tomorrow at her follow up appointment.  She verbalized understanding and agreement.

## 2020-07-08 NOTE — Telephone Encounter (Signed)
Called Heidi Johnson and let her know the refill has been sent.

## 2020-07-10 ENCOUNTER — Inpatient Hospital Stay: Payer: Medicare Other

## 2020-07-10 ENCOUNTER — Other Ambulatory Visit: Payer: Self-pay | Admitting: Hematology and Oncology

## 2020-07-10 ENCOUNTER — Inpatient Hospital Stay (HOSPITAL_BASED_OUTPATIENT_CLINIC_OR_DEPARTMENT_OTHER): Payer: Medicare Other | Admitting: Hematology and Oncology

## 2020-07-10 ENCOUNTER — Telehealth: Payer: Self-pay

## 2020-07-10 ENCOUNTER — Encounter: Payer: Self-pay | Admitting: Hematology and Oncology

## 2020-07-10 ENCOUNTER — Other Ambulatory Visit: Payer: Self-pay

## 2020-07-10 VITALS — HR 94

## 2020-07-10 DIAGNOSIS — C569 Malignant neoplasm of unspecified ovary: Secondary | ICD-10-CM

## 2020-07-10 DIAGNOSIS — K5909 Other constipation: Secondary | ICD-10-CM | POA: Diagnosis not present

## 2020-07-10 DIAGNOSIS — G894 Chronic pain syndrome: Secondary | ICD-10-CM | POA: Diagnosis not present

## 2020-07-10 DIAGNOSIS — Z7189 Other specified counseling: Secondary | ICD-10-CM

## 2020-07-10 DIAGNOSIS — E559 Vitamin D deficiency, unspecified: Secondary | ICD-10-CM | POA: Insufficient documentation

## 2020-07-10 DIAGNOSIS — I714 Abdominal aortic aneurysm, without rupture, unspecified: Secondary | ICD-10-CM

## 2020-07-10 LAB — COMPREHENSIVE METABOLIC PANEL
ALT: 31 U/L (ref 0–44)
AST: 18 U/L (ref 15–41)
Albumin: 3.2 g/dL — ABNORMAL LOW (ref 3.5–5.0)
Alkaline Phosphatase: 89 U/L (ref 38–126)
Anion gap: 11 (ref 5–15)
BUN: 13 mg/dL (ref 6–20)
CO2: 24 mmol/L (ref 22–32)
Calcium: 8.6 mg/dL — ABNORMAL LOW (ref 8.9–10.3)
Chloride: 98 mmol/L (ref 98–111)
Creatinine, Ser: 0.72 mg/dL (ref 0.44–1.00)
GFR, Estimated: >60 mL/min (ref >60)
Glucose, Bld: 158 mg/dL — ABNORMAL HIGH (ref 70–99)
Potassium: 3.5 mmol/L (ref 3.5–5.1)
Sodium: 133 mmol/L — ABNORMAL LOW (ref 135–145)
Total Bilirubin: 0.3 mg/dL (ref 0.3–1.2)
Total Protein: 6.4 g/dL — ABNORMAL LOW (ref 6.5–8.1)

## 2020-07-10 LAB — CBC WITH DIFFERENTIAL/PLATELET
Abs Immature Granulocytes: 0.06 10*3/uL (ref 0.00–0.07)
Basophils Absolute: 0.1 10*3/uL (ref 0.0–0.1)
Basophils Relative: 1 %
Eosinophils Absolute: 0 10*3/uL (ref 0.0–0.5)
Eosinophils Relative: 1 %
HCT: 35.9 % — ABNORMAL LOW (ref 36.0–46.0)
Hemoglobin: 11.7 g/dL — ABNORMAL LOW (ref 12.0–15.0)
Immature Granulocytes: 1 %
Lymphocytes Relative: 33 %
Lymphs Abs: 1.9 10*3/uL (ref 0.7–4.0)
MCH: 29.8 pg (ref 26.0–34.0)
MCHC: 32.6 g/dL (ref 30.0–36.0)
MCV: 91.6 fL (ref 80.0–100.0)
Monocytes Absolute: 0.4 10*3/uL (ref 0.1–1.0)
Monocytes Relative: 6 %
Neutro Abs: 3.4 10*3/uL (ref 1.7–7.7)
Neutrophils Relative %: 58 %
Platelets: 174 10*3/uL (ref 150–400)
RBC: 3.92 MIL/uL (ref 3.87–5.11)
RDW: 15.9 % — ABNORMAL HIGH (ref 11.5–15.5)
WBC: 5.8 10*3/uL (ref 4.0–10.5)
nRBC: 0 % (ref 0.0–0.2)

## 2020-07-10 LAB — VITAMIN D 25 HYDROXY (VIT D DEFICIENCY, FRACTURES): Vit D, 25-Hydroxy: 13.85 ng/mL — ABNORMAL LOW (ref 30–100)

## 2020-07-10 MED ORDER — PROCHLORPERAZINE MALEATE 10 MG PO TABS
10.0000 mg | ORAL_TABLET | Freq: Once | ORAL | Status: AC
  Administered 2020-07-10: 10 mg via ORAL

## 2020-07-10 MED ORDER — POLYETHYLENE GLYCOL 3350 17 G PO PACK: 17.0000 g | PACK | Freq: Two times a day (BID) | ORAL | 11 refills | Status: AC

## 2020-07-10 MED ORDER — SODIUM CHLORIDE 0.9% FLUSH
10.0000 mL | INTRAVENOUS | Status: DC | PRN
  Administered 2020-07-10: 10 mL
  Filled 2020-07-10: qty 10

## 2020-07-10 MED ORDER — SODIUM CHLORIDE 0.9 % IV SOLN
Freq: Once | INTRAVENOUS | Status: AC
  Filled 2020-07-10: qty 250

## 2020-07-10 MED ORDER — SODIUM CHLORIDE 0.9% FLUSH
10.0000 mL | Freq: Once | INTRAVENOUS | Status: AC
  Administered 2020-07-10: 10 mL
  Filled 2020-07-10: qty 10

## 2020-07-10 MED ORDER — ERGOCALCIFEROL 1.25 MG (50000 UT) PO CAPS: 50000.0000 [IU] | ORAL_CAPSULE | ORAL | 1 refills | Status: DC

## 2020-07-10 MED ORDER — SODIUM CHLORIDE 0.9 % IV SOLN
800.0000 mg/m2 | Freq: Once | INTRAVENOUS | Status: AC
  Administered 2020-07-10: 1672 mg via INTRAVENOUS
  Filled 2020-07-10: qty 43.97

## 2020-07-10 MED ORDER — PROCHLORPERAZINE MALEATE 10 MG PO TABS
ORAL_TABLET | ORAL | Status: AC
  Filled 2020-07-10: qty 1

## 2020-07-10 MED ORDER — HEPARIN SOD (PORK) LOCK FLUSH 100 UNIT/ML IV SOLN
500.0000 [IU] | Freq: Once | INTRAVENOUS | Status: AC | PRN
  Administered 2020-07-10: 500 [IU]
  Filled 2020-07-10: qty 5

## 2020-07-10 MED ORDER — SENNOSIDES 8.6 MG PO TABS: 2.0000 | ORAL_TABLET | Freq: Three times a day (TID) | ORAL | 1 refills | Status: AC

## 2020-07-10 NOTE — Assessment & Plan Note (Signed)
She has multifactorial pain I refill her prescription pain medicine recently We discussed narcotic refill policy Her vitamin D level came back low and that could exacerbate her pain I recommend high-dose vitamin D supplement

## 2020-07-10 NOTE — Progress Notes (Signed)
[place] OFFICE PROGRESS NOTE  Patient Care Team: Pcp, No as PCP - General  ASSESSMENT & PLAN:  Ovarian cancer ([place]) So far, she tolerated chemo well except for constipation We will continue aggressive supportive care  Chronic pain syndrome She has multifactorial pain I refill her prescription pain medicine recently We discussed narcotic refill policy Her vitamin D level came back low and that could exacerbate her pain I recommend high-dose vitamin D supplement  Other constipation We have extensive discussions about the importance of aggressive laxative therapy I will get my nursing staff to call her next week to follow   No orders of the defined types were placed in this encounter.   All questions were answered. The patient knows to call the clinic with any problems, questions or concerns. The total time spent in the appointment was 20 minutes encounter with patients including review of chart and various tests results, discussions about plan of care and coordination of care plan   [name], MD 07/10/2020 5:35 PM  INTERVAL HISTORY: Please see below for problem oriented charting. She returns with her friend for further follow-up and chemotherapy Since last time I saw her, she has some mild nausea but more with constipation Her pain is reasonably controlled The patient and friend has a lot of questions regarding her diagnosis  SUMMARY OF ONCOLOGIC HISTORY: Oncology History Overview Note  Negative genetics at [facility]   Ovarian cancer ([place])  04/25/2016 Imaging   CT confirms 12cm ovarian masses with extensive peritoneal metastases and malignant pleural effusions   05/05/2016 Surgery   s/p ex-lap posterior exenteration, diaphragm stripping, cholecystectomy, omentectomy, appendectomy and optimal tumor debulking on 05/05/16   06/24/2016 - 10/24/2016 Chemotherapy   Started Taxol/Carboplatin. 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  Numerous left predominant calcified pleural metastases and associated small left pleural effusion are unchanged.  2.  Unchanged (3.0 cm) (series 3, image 96) left breast mass    02/07/2019 Imaging   Outside Ct abdomen and pelvis 1. Overall stable small volume peritoneal tumor, as described above.  2. Unchanged infrarenal abdominal aortic aneurysm.    02/22/2019 Tumor Marker   Patient's tumor was tested for the following markers: CA-125 Results of the tumor marker test revealed 164   03/18/2019 Tumor Marker   Patient's tumor was tested for the following markers: CA-125 Results of the tumor marker test revealed 162   04/08/2019 Tumor Marker   Patient's tumor was tested for the following markers: CA-125 Results of the tumor marker test revealed 164   04/29/2019 Tumor Marker   Patient's tumor was tested for the following markers: CA-125 Results of the tumor marker test revealed 181   05/20/2019 Tumor Marker   Patient's tumor was tested for the following markers: CA-125 Results of the tumor marker test revealed 164   06/10/2019 Tumor Marker   Patient's tumor was tested for the following markers: CA-125 Results of the tumor marker test revealed 152   07/01/2019 Tumor Marker   Patient's tumor was tested for the following markers: CA-125 Results of the  tumor marker test revealed 160   07/22/2019 Tumor Marker   Patient's tumor was tested for the following markers: CA-125 Results of the tumor marker test revealed 171   08/12/2019 Tumor Marker   Patient's tumor was tested for the following markers:  CA-125 Results of the tumor marker test revealed 173   09/02/2019 Tumor Marker   Patient's tumor was tested for the following markers: CA-125 Results of the tumor marker test revealed 159   09/30/2019 Tumor Marker   Patient's tumor was tested for the following markers: CA-125 Results of the tumor marker test revealed 168   11/08/2019 Imaging   Outside CT imaging 1. Study was performed at outside institution on 11/08/2019 and presented for review on 11/14/2019.  2. Stable calcified and noncalcified left-sided nodular and plaque-like pleural abnormalities with small chronic left pleural effusion compatible with stable treated pleural metastasis. No new or enlarging lesions.  3. No enlarging thoracic lymph nodes.  4. Redemonstration of left breast mass. Correlation with mammography is suggested if not already performed   12/02/2019 Tumor Marker   Patient's tumor was tested for the following markers: CA-125 Results of the tumor marker test revealed 186   12/18/2019 Cancer Staging   Staging form: Ovary, Fallopian Tube, and Primary Peritoneal Carcinoma, AJCC 8th Edition - Clinical stage from 12/18/2019: FIGO Stage IV (rcT3c, cN0, cM1) - Signed by [name], MD on 04/24/2020   01/09/2020 Tumor Marker   Patient's tumor was tested for the following markers: CA-125 Results of the tumor marker test revealed 184   01/10/2020 Imaging   1. Treated left pleural and peritoneal metastatic disease. Areas of residual soft tissue prominence in the right anatomic pelvis are indeterminate. Comparison with prior exams would be helpful. 2. Hepatomegaly. Low-attenuation lesions in the liver are too small to characterize. Comparison with prior exams would be helpful. 3. Small left fibrothorax. 4. Infrarenal Aortic aneurysm NOS (ICD10-I71.9). Recommend followup by ultrasound in 2 years.  5. Aortic atherosclerosis (ICD10-I70.0). Coronary artery calcification.   03/09/2020 Tumor Marker   Patient's tumor was tested  for the following markers: CA-125 Results of the tumor marker test revealed 214   04/01/2020 Tumor Marker   Patient's tumor was tested for the following markers: CA-125 Results of the tumor marker test revealed 245.   04/03/2020 Imaging   1. No acute findings in the abdomen or pelvis. Specifically, no findings to explain the patient's history of left upper quadrant pain with nausea and vomiting. 2. Stable appearance of calcified omental and mesenteric nodules consistent with treated metastatic disease. Soft tissue fullness seen in the right adnexal region on the prior exam is similar today. 3. Stable 1.6 x 1.2 cm low-density lesion lateral segment left liver. 4. 3.5 cm infrarenal abdominal aortic aneurysm. Recommend follow-up ultrasound every 2 years. This recommendation follows ACR consensus guidelines: White Paper of the ACR Incidental Findings Committee II on Vascular Findings. J Am Coll Radiol 2013; 63:016-010. 5. Aortic Atherosclerosis (ICD10-I70.0).   04/15/2020 Imaging   Focus of abnormal uptake is seen involving the greater trochanter of the proximal left femur which corresponds to sclerotic density seen on prior CT scan and is concerning for metastatic disease.   04/23/2020 Imaging   1. No hip fracture, dislocation or avascular necrosis. 2. A 18 mm bone lesion in the peripheral superior aspect of the left greater trochanter with enhancement on postcontrast imaging. The overall appearance is most concerning for metastatic disease in the setting of known malignancy. 3. 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 4. Degenerative disease with disc height loss at L4-5 and L5-S1 with bilateral facet arthropathy.   06/08/2020 Tumor Marker   Patient's tumor was tested for the following markers: CA-125. Results of the tumor marker test revealed 272.   06/24/2020 Imaging   1. Overall progression of calcified and noncalcified peritoneal surface and omental lesions. 2. Slight progression of  disease at the left lung base and involving the pleura. 3. Stable sclerotic bone lesion involving the left greater trochanter. 4. Stable infrarenal abdominal aortic aneurysm.   07/03/2020 -  Chemotherapy    Patient is on Treatment Plan: OVARIAN RECURRENT 3RD LINE CARBOPLATIN D1 / GEMCITABINE D1,8 (4/800) Q21D      07/03/2020 Tumor Marker   Patient's tumor was tested for the following markers: CA-125 Results of the tumor marker test revealed 285.     REVIEW OF SYSTEMS:   Constitutional: Denies fevers, chills or abnormal weight loss Eyes: Denies blurriness of vision Ears, nose, mouth, throat, and face: Denies mucositis or sore throat Respiratory: Denies cough, dyspnea or wheezes Cardiovascular: Denies palpitation, chest discomfort or lower extremity swelling Skin: Denies abnormal skin rashes Lymphatics: Denies new lymphadenopathy or easy bruising Neurological:Denies numbness, tingling or new weaknesses Behavioral/Psych: Mood is stable, no new changes  All other systems were reviewed with the patient and are negative.  I have reviewed the past medical history, past surgical history, social history and family history with the patient and they are unchanged from previous note.  ALLERGIES:  is allergic to latex and sulfamethoxazole-trimethoprim.  MEDICATIONS:  Current Outpatient Medications  Medication Sig Dispense Refill  . senna (SENOKOT) 8.6 MG tablet Take 2 tablets (17.2 mg total) by mouth 3 (three) times daily. 90 tablet 1  . albuterol (VENTOLIN HFA) 108 (90 Base) MCG/ACT inhaler Inhale 2 puffs into the lungs as needed. 2 puffs into lungs every 6 hours as needed for wheezing or SOB    . aspirin 325 MG EC tablet Take 325 mg by mouth daily.    . clonazePAM (KLONOPIN) 1 MG tablet Take 1 mg by mouth 3 (three) times daily as needed for anxiety.    . cyclobenzaprine (FLEXERIL) 5 MG tablet TAKE 1 TABLET (5 MG TOTAL) BY MOUTH 3 (THREE) TIMES DAILY AS NEEDED. 60 tablet 1  . docusate sodium  (COLACE) 100 MG capsule Take 100 mg by mouth 2 (two) times daily.    . ergocalciferol (VITAMIN D2) 1.25 MG (50000 UT) capsule Take 1 capsule (50,000 Units total) by mouth once a week. 12 capsule 1  . gabapentin (NEURONTIN) 300 MG capsule Take 3 capsules (900 mg total) by mouth 3 (three) times daily. 270 capsule 4  . hydrochlorothiazide (HYDRODIURIL) 25 MG tablet Take 25 mg by mouth daily.    Marland Kitchen HYDROmorphone (DILAUDID) 4 MG tablet Take 1 tablet (4 mg total) by mouth every 4 (four) hours as needed for severe pain. 90 tablet 0  . ibuprofen (ADVIL) 600 MG tablet Take 600 mg by mouth every 6 (six) hours as needed.    . lidocaine-prilocaine (EMLA) cream Apply 1 application topically as needed (port).     Marland Kitchen lisinopril-hydrochlorothiazide (ZESTORETIC) 20-25 MG tablet Take 1 tablet by mouth 2 (two) times daily.    Marland Kitchen MARINOL 5 MG capsule Take 5 mg by mouth 3 (three) times daily.    Marland Kitchen morphine (MS CONTIN) 30 MG 12 hr tablet Take 1 tablet (30 mg total) by mouth every 8 (eight) hours. 90 tablet 0  . naloxone (NARCAN) 2 MG/2ML injection Place 2  mg into the nose as needed.     Marland Kitchen OLANZapine (ZYPREXA) 10 MG tablet Take 10 mg by mouth at bedtime.    . ondansetron (ZOFRAN-ODT) 8 MG disintegrating tablet Take 1 tablet (8 mg total) by mouth every 8 (eight) hours as needed for nausea or vomiting. 60 tablet 1  . polyethylene glycol (MIRALAX / GLYCOLAX) 17 g packet Take 17 g by mouth 2 (two) times daily. 100 each 11  . prochlorperazine (COMPAZINE) 10 MG tablet Take 1 tablet (10 mg total) by mouth every 6 (six) hours as needed (Nausea or vomiting). 30 tablet 1  . rosuvastatin (CRESTOR) 40 MG tablet Take 40 mg by mouth daily.     No current facility-administered medications for this visit.   Facility-Administered Medications Ordered in Other Visits  Medication Dose Route Frequency Provider Last Rate Last Admin  . heparin lock flush 100 unit/mL  500 Units Intracatheter Once Latise Dilley, MD      . sodium chloride flush  (NS) 0.9 % injection 10 mL  10 mL Intracatheter PRN Alvy Bimler, Litsy Epting, MD   10 mL at 07/10/20 1403    PHYSICAL EXAMINATION: ECOG PERFORMANCE STATUS: 1 - Symptomatic but completely ambulatory  Vitals:   07/10/20 1220  BP: 124/85  Pulse: (!) 123  Resp: 18  Temp: 97.8 F (36.6 C)  SpO2: 98%   Filed Weights   07/10/20 1220  Weight: 195 lb 3.2 oz (88.5 kg)    GENERAL:alert, no distress and comfortable NEURO: alert & oriented x 3 with fluent speech, no focal motor/sensory deficits  LABORATORY DATA:  I have reviewed the data as listed    Component Value Date/Time   NA 133 (L) 07/10/2020 1139   K 3.5 07/10/2020 1139   CL 98 07/10/2020 1139   CO2 24 07/10/2020 1139   GLUCOSE 158 (H) 07/10/2020 1139   BUN 13 07/10/2020 1139   CREATININE 0.72 07/10/2020 1139   CREATININE 0.83 05/18/2020 1313   CALCIUM 8.6 (L) 07/10/2020 1139   PROT 6.4 (L) 07/10/2020 1139   ALBUMIN 3.2 (L) 07/10/2020 1139   AST 18 07/10/2020 1139   AST 21 05/18/2020 1313   ALT 31 07/10/2020 1139   ALT 33 05/18/2020 1313   ALKPHOS 89 07/10/2020 1139   BILITOT 0.3 07/10/2020 1139   BILITOT 0.6 05/18/2020 1313   GFRNONAA >60 07/10/2020 1139   GFRNONAA >60 05/18/2020 1313   GFRAA >60 03/09/2020 1005   GFRAA >60 01/09/2020 1404    No results found for: SPEP, UPEP  Lab Results  Component Value Date   WBC 5.8 07/10/2020   NEUTROABS 3.4 07/10/2020   HGB 11.7 (L) 07/10/2020   HCT 35.9 (L) 07/10/2020   MCV 91.6 07/10/2020   PLT 174 07/10/2020      Chemistry      Component Value Date/Time   NA 133 (L) 07/10/2020 1139   K 3.5 07/10/2020 1139   CL 98 07/10/2020 1139   CO2 24 07/10/2020 1139   BUN 13 07/10/2020 1139   CREATININE 0.72 07/10/2020 1139   CREATININE 0.83 05/18/2020 1313      Component Value Date/Time   CALCIUM 8.6 (L) 07/10/2020 1139   ALKPHOS 89 07/10/2020 1139   AST 18 07/10/2020 1139   AST 21 05/18/2020 1313   ALT 31 07/10/2020 1139   ALT 33 05/18/2020 1313   BILITOT 0.3 07/10/2020  1139   BILITOT 0.6 05/18/2020 1313       RADIOGRAPHIC STUDIES: I have personally reviewed the radiological images as  listed and agreed with the findings in the report. CT ABDOMEN PELVIS W CONTRAST  Result Date: 06/24/2020 CLINICAL DATA:  Restaging ovarian cancer. EXAM: CT ABDOMEN AND PELVIS WITH CONTRAST TECHNIQUE: Multidetector CT imaging of the abdomen and pelvis was performed using the standard protocol following bolus administration of intravenous contrast. CONTRAST:  116mL OMNIPAQUE IOHEXOL 300 MG/ML  SOLN COMPARISON:  CT scan 04/03/2020 FINDINGS: Lower chest: Stable appearing extensive calcified tumor at the left lung base mainly involving the pleural surfaces. Partially calcified epicardial lymph node on right measures 13 x 13 mm and previously measured 10.5 x 10.5 mm. The heart is normal in size. No pericardial effusion. Distal esophagus is grossly normal. Hepatobiliary: Numerous calcified peritoneal surface lesions around the liver appear relatively stable. There are also partially calcified lesions in the falciform ligament. The largest lesion measures 13 mm on image 27/2. It previously measured 11.5 mm. The hepatic and portal veins are patent. Gallbladder is surgically absent. Calcified lesion in the gallbladder fossa on image number 30/2 measures 11.5 mm and previously measured 9 mm. No common bile duct dilatation. Pancreas: No mass, inflammation or ductal dilatation. Spleen: Normal size. No intraparenchymal lesions. Numerous peritoneal surface lesions are again demonstrated. Adrenals/Urinary Tract: The adrenal glands and kidneys are unremarkable and stable. The bladder is grossly normal. Stomach/Bowel: The stomach, duodenum, small bowel and colon are grossly normal. No obstructive findings. Peritoneal surface lesions are again demonstrated. Vascular/Lymphatic: Stable advanced atherosclerotic calcifications involving the aorta and branch vessels. Stable 3.4 x 3.3 cm infrarenal abdominal aortic  aneurysm. Diffuse omental and peritoneal surface disease is again demonstrated with numerous calcified and noncalcified nodules. Partially calcified omental lesion on image 22/2 near the stomach measures 23 x 19.5 mm and previously measured 16 x 11.5 mm. The lesion in the hepatoduodenal space on image 32/2 measures 16 x 13 mm. This previously measured 14 x 8 mm. Implant in the lower right pelvis anteriorly measures 40.5 x 15 mm on image 78/2. This previously measured 35 x 13 mm. Reproductive: Surgically absent. Other: No free air or free fluid collections. Musculoskeletal: Stable sclerotic lesion involving the left greater trochanter. A few smaller scattered sclerotic bone lesions are also stable. IMPRESSION: 1. Overall progression of calcified and noncalcified peritoneal surface and omental lesions. 2. Slight progression of disease at the left lung base and involving the pleura. 3. Stable sclerotic bone lesion involving the left greater trochanter. 4. Stable infrarenal abdominal aortic aneurysm. Aortic Atherosclerosis (ICD10-I70.0). Electronically Signed   By: Marijo Sanes M.D.   On: 06/24/2020 14:08

## 2020-07-10 NOTE — Patient Instructions (Signed)
[facility] Discharge Instructions for Patients Receiving Chemotherapy  Today you received the following chemotherapy agents: gemcitabine.  To help prevent nausea and vomiting after your treatment, we encourage you to take your nausea medication as directed.   If you develop nausea and vomiting that is not controlled by your nausea medication, call the clinic.   BELOW ARE SYMPTOMS THAT SHOULD BE REPORTED IMMEDIATELY:  *FEVER GREATER THAN 100.5 F  *CHILLS WITH OR WITHOUT FEVER  NAUSEA AND VOMITING THAT IS NOT CONTROLLED WITH YOUR NAUSEA MEDICATION  *UNUSUAL SHORTNESS OF BREATH  *UNUSUAL BRUISING OR BLEEDING  TENDERNESS IN MOUTH AND THROAT WITH OR WITHOUT PRESENCE OF ULCERS  *URINARY PROBLEMS  *BOWEL PROBLEMS  UNUSUAL RASH Items with * indicate a potential emergency and should be followed up as soon as possible.  Feel free to call the clinic should you have any questions or concerns. The clinic phone number is [phone].  Please show the CHEMO ALERT CARD at check-in to the Emergency Department and triage nurse.   

## 2020-07-10 NOTE — Telephone Encounter (Signed)
Please tell her pharmacy cannot fill miralax Get OTC miralax

## 2020-07-10 NOTE — Assessment & Plan Note (Signed)
So far, she tolerated chemo well except for constipation We will continue aggressive supportive care

## 2020-07-10 NOTE — Assessment & Plan Note (Signed)
We have extensive discussions about the importance of aggressive laxative therapy I will get my nursing staff to call her next week to follow

## 2020-07-10 NOTE — Telephone Encounter (Signed)
Called and given vitamin D results and told to pick up vitamin D Rx at [facility] and take weekly. Pharmacy sent a message they are unable to fill Miralax Rx, she needs to by generic OTC. She verbalized understanding.

## 2020-07-11 LAB — CA 125: Cancer Antigen (CA) 125: 332 U/mL — ABNORMAL HIGH (ref 0.0–38.1)

## 2020-07-13 ENCOUNTER — Telehealth: Payer: Self-pay | Admitting: Oncology

## 2020-07-13 ENCOUNTER — Other Ambulatory Visit: Payer: Self-pay | Admitting: Hematology and Oncology

## 2020-07-13 MED ORDER — HYDROMORPHONE HCL 4 MG PO TABS: 4.0000 mg | ORAL_TABLET | ORAL | 0 refills | Status: DC | PRN

## 2020-07-13 NOTE — Telephone Encounter (Signed)
Called Kelce and let her know the refill has been sent. 

## 2020-07-13 NOTE — Telephone Encounter (Signed)
Frannie called and requested a refill of Dilaudid to be sent to the [facility] in [place].  She will be out on Wednesday.    She also mentioned that her tongue is sore underneath and is wondering if it is thrush.  She is going to try a baking soda/salt water rinse to see if that helps.

## 2020-07-13 NOTE — Telephone Encounter (Signed)
done

## 2020-07-20 ENCOUNTER — Telehealth: Payer: Self-pay

## 2020-07-20 NOTE — Telephone Encounter (Signed)
Received a message from lab that she would like to come in a earlier day for lab appt prior to infusion.  Called and left a message asking her to call the office back.

## 2020-07-20 NOTE — Telephone Encounter (Signed)
She called back. Rescheduled lab/flush appt to her requested time at 2 pm 2/3. She is complaining about reading office visit notes from Dr. Alvy Bimler and she does not like the wording of the office visit notes. Complaining that she does not feel Dr. Alvy Bimler listens to her issues. She would like to change providers if possible if this does not delay her care and treatment.

## 2020-07-21 ENCOUNTER — Encounter (HOSPITAL_COMMUNITY): Payer: Self-pay

## 2020-07-21 NOTE — Telephone Encounter (Signed)
I notify Alphonzo Grieve and requested her assistance

## 2020-07-21 NOTE — Progress Notes (Signed)
Notification received from Dr. Alvy Bimler that patient is interested in transferring oncology care. I have attempted to reach the patient to discuss transfer of care but was unable to reach the patient. I have left a detailed message asking that the patient call me back to discuss further.

## 2020-07-22 ENCOUNTER — Telehealth: Payer: Self-pay

## 2020-07-22 NOTE — Telephone Encounter (Signed)
Called and reviewed all upcoming appointments. Reassured her that Dr. Alvy Bimler would still be her physician until her care has transferred to new provider. She verbalized understanding and will call the questions.

## 2020-07-23 ENCOUNTER — Inpatient Hospital Stay: Payer: Medicare Other | Attending: Medical

## 2020-07-23 ENCOUNTER — Other Ambulatory Visit: Payer: Self-pay | Admitting: Hematology and Oncology

## 2020-07-23 ENCOUNTER — Other Ambulatory Visit: Payer: Self-pay

## 2020-07-23 ENCOUNTER — Inpatient Hospital Stay: Payer: Medicare Other

## 2020-07-23 DIAGNOSIS — C569 Malignant neoplasm of unspecified ovary: Secondary | ICD-10-CM

## 2020-07-23 DIAGNOSIS — C7951 Secondary malignant neoplasm of bone: Secondary | ICD-10-CM | POA: Diagnosis not present

## 2020-07-23 DIAGNOSIS — Z5111 Encounter for antineoplastic chemotherapy: Secondary | ICD-10-CM | POA: Diagnosis present

## 2020-07-23 DIAGNOSIS — I509 Heart failure, unspecified: Secondary | ICD-10-CM | POA: Insufficient documentation

## 2020-07-23 DIAGNOSIS — I714 Abdominal aortic aneurysm, without rupture, unspecified: Secondary | ICD-10-CM

## 2020-07-23 DIAGNOSIS — I10 Essential (primary) hypertension: Secondary | ICD-10-CM | POA: Diagnosis not present

## 2020-07-23 DIAGNOSIS — Z8673 Personal history of transient ischemic attack (TIA), and cerebral infarction without residual deficits: Secondary | ICD-10-CM | POA: Insufficient documentation

## 2020-07-23 DIAGNOSIS — C786 Secondary malignant neoplasm of retroperitoneum and peritoneum: Secondary | ICD-10-CM | POA: Diagnosis not present

## 2020-07-23 DIAGNOSIS — Z87891 Personal history of nicotine dependence: Secondary | ICD-10-CM | POA: Diagnosis not present

## 2020-07-23 LAB — CBC WITH DIFFERENTIAL/PLATELET
Abs Immature Granulocytes: 0.05 10*3/uL (ref 0.00–0.07)
Basophils Absolute: 0.1 10*3/uL (ref 0.0–0.1)
Basophils Relative: 1 %
Eosinophils Absolute: 0.1 10*3/uL (ref 0.0–0.5)
Eosinophils Relative: 1 %
HCT: 36.5 % (ref 36.0–46.0)
Hemoglobin: 11.9 g/dL — ABNORMAL LOW (ref 12.0–15.0)
Immature Granulocytes: 1 %
Lymphocytes Relative: 29 %
Lymphs Abs: 1.9 10*3/uL (ref 0.7–4.0)
MCH: 29.9 pg (ref 26.0–34.0)
MCHC: 32.6 g/dL (ref 30.0–36.0)
MCV: 91.7 fL (ref 80.0–100.0)
Monocytes Absolute: 0.8 10*3/uL (ref 0.1–1.0)
Monocytes Relative: 12 %
Neutro Abs: 3.6 10*3/uL (ref 1.7–7.7)
Neutrophils Relative %: 56 %
Platelets: 470 10*3/uL — ABNORMAL HIGH (ref 150–400)
RBC: 3.98 MIL/uL (ref 3.87–5.11)
RDW: 16.4 % — ABNORMAL HIGH (ref 11.5–15.5)
WBC: 6.4 10*3/uL (ref 4.0–10.5)
nRBC: 0 % (ref 0.0–0.2)

## 2020-07-23 LAB — COMPREHENSIVE METABOLIC PANEL
ALT: 96 U/L — ABNORMAL HIGH (ref 0–44)
AST: 60 U/L — ABNORMAL HIGH (ref 15–41)
Albumin: 3.6 g/dL (ref 3.5–5.0)
Alkaline Phosphatase: 110 U/L (ref 38–126)
Anion gap: 11 (ref 5–15)
BUN: 5 mg/dL — ABNORMAL LOW (ref 6–20)
CO2: 26 mmol/L (ref 22–32)
Calcium: 9.4 mg/dL (ref 8.9–10.3)
Chloride: 103 mmol/L (ref 98–111)
Creatinine, Ser: 0.74 mg/dL (ref 0.44–1.00)
GFR, Estimated: >60 mL/min (ref >60)
Glucose, Bld: 142 mg/dL — ABNORMAL HIGH (ref 70–99)
Potassium: 3.8 mmol/L (ref 3.5–5.1)
Sodium: 140 mmol/L (ref 135–145)
Total Bilirubin: 0.3 mg/dL (ref 0.3–1.2)
Total Protein: 7 g/dL (ref 6.5–8.1)

## 2020-07-23 MED ORDER — SODIUM CHLORIDE 0.9% FLUSH
10.0000 mL | Freq: Once | INTRAVENOUS | Status: AC
  Administered 2020-07-23: 10 mL
  Filled 2020-07-23: qty 10

## 2020-07-23 MED ORDER — HEPARIN SOD (PORK) LOCK FLUSH 100 UNIT/ML IV SOLN
500.0000 [IU] | Freq: Once | INTRAVENOUS | Status: AC
  Administered 2020-07-23: 500 [IU]
  Filled 2020-07-23: qty 5

## 2020-07-24 ENCOUNTER — Other Ambulatory Visit: Payer: Medicare Other

## 2020-07-24 ENCOUNTER — Inpatient Hospital Stay (HOSPITAL_BASED_OUTPATIENT_CLINIC_OR_DEPARTMENT_OTHER): Payer: Medicare Other | Admitting: Medical

## 2020-07-24 ENCOUNTER — Telehealth: Payer: Self-pay | Admitting: Oncology

## 2020-07-24 ENCOUNTER — Other Ambulatory Visit: Payer: Self-pay

## 2020-07-24 ENCOUNTER — Ambulatory Visit: Payer: Medicare Other | Admitting: Hematology and Oncology

## 2020-07-24 ENCOUNTER — Inpatient Hospital Stay: Payer: Medicare Other

## 2020-07-24 VITALS — BP 145/89 | HR 106 | Temp 97.0°F | Resp 20 | Ht 69.0 in | Wt 196.3 lb

## 2020-07-24 VITALS — HR 98

## 2020-07-24 DIAGNOSIS — Z5111 Encounter for antineoplastic chemotherapy: Secondary | ICD-10-CM | POA: Diagnosis not present

## 2020-07-24 DIAGNOSIS — C786 Secondary malignant neoplasm of retroperitoneum and peritoneum: Secondary | ICD-10-CM | POA: Diagnosis not present

## 2020-07-24 DIAGNOSIS — C569 Malignant neoplasm of unspecified ovary: Secondary | ICD-10-CM

## 2020-07-24 DIAGNOSIS — M898X9 Other specified disorders of bone, unspecified site: Secondary | ICD-10-CM | POA: Diagnosis not present

## 2020-07-24 DIAGNOSIS — G893 Neoplasm related pain (acute) (chronic): Secondary | ICD-10-CM | POA: Diagnosis not present

## 2020-07-24 DIAGNOSIS — Z7189 Other specified counseling: Secondary | ICD-10-CM

## 2020-07-24 LAB — CA 125: Cancer Antigen (CA) 125: 308 U/mL — ABNORMAL HIGH (ref 0.0–38.1)

## 2020-07-24 MED ORDER — HEPARIN SOD (PORK) LOCK FLUSH 100 UNIT/ML IV SOLN
500.0000 [IU] | Freq: Once | INTRAVENOUS | Status: AC | PRN
  Administered 2020-07-24: 500 [IU]
  Filled 2020-07-24: qty 5

## 2020-07-24 MED ORDER — SODIUM CHLORIDE 0.9 % IV SOLN
720.0000 mg/m2 | Freq: Once | INTRAVENOUS | Status: AC
  Administered 2020-07-24: 1482 mg via INTRAVENOUS
  Filled 2020-07-24: qty 38.98

## 2020-07-24 MED ORDER — LORAZEPAM 2 MG/ML IJ SOLN
INTRAMUSCULAR | Status: AC
  Filled 2020-07-24: qty 1

## 2020-07-24 MED ORDER — PALONOSETRON HCL INJECTION 0.25 MG/5ML
INTRAVENOUS | Status: AC
  Filled 2020-07-24: qty 5

## 2020-07-24 MED ORDER — LORAZEPAM 2 MG/ML IJ SOLN
0.5000 mg | Freq: Once | INTRAMUSCULAR | Status: AC
  Administered 2020-07-24: 0.5 mg via INTRAVENOUS

## 2020-07-24 MED ORDER — SODIUM CHLORIDE 0.9% FLUSH
10.0000 mL | INTRAVENOUS | Status: DC | PRN
  Administered 2020-07-24: 10 mL
  Filled 2020-07-24: qty 10

## 2020-07-24 MED ORDER — SODIUM CHLORIDE 0.9 % IV SOLN
570.0000 mg | Freq: Once | INTRAVENOUS | Status: AC
Start: 2020-07-24 — End: 2020-07-24
  Administered 2020-07-24: 570 mg via INTRAVENOUS
  Filled 2020-07-24: qty 57

## 2020-07-24 MED ORDER — SODIUM CHLORIDE 0.9 % IV SOLN
150.0000 mg | Freq: Once | INTRAVENOUS | Status: AC
  Administered 2020-07-24: 150 mg via INTRAVENOUS
  Filled 2020-07-24: qty 150

## 2020-07-24 MED ORDER — PALONOSETRON HCL INJECTION 0.25 MG/5ML
0.2500 mg | Freq: Once | INTRAVENOUS | Status: AC
  Administered 2020-07-24: 0.25 mg via INTRAVENOUS

## 2020-07-24 MED ORDER — SODIUM CHLORIDE 0.9 % IV SOLN
Freq: Once | INTRAVENOUS | Status: AC
  Filled 2020-07-24: qty 250

## 2020-07-24 MED ORDER — SODIUM CHLORIDE 0.9 % IV SOLN
10.0000 mg | Freq: Once | INTRAVENOUS | Status: AC
  Administered 2020-07-24: 10 mg via INTRAVENOUS
  Filled 2020-07-24: qty 10

## 2020-07-24 MED ORDER — PROMETHAZINE HCL 12.5 MG PO TABS: 12.5000 mg | ORAL_TABLET | Freq: Four times a day (QID) | ORAL | 2 refills | Status: DC | PRN

## 2020-07-24 NOTE — Progress Notes (Signed)
[facility] Progress Note   Heidi Johnson [id number] 07-26-1970 50 y.o.  Heidi Johnson is managed by Dr. [name].  Transitioning to Dr. 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 Myre has previously been treated by Dr. [name] but is transitioning to Dr. Delton Coombes at [facility].  She will be seen by him on 07/30/2020.  She presents to the clinic today for consideration of cycle 2, day 1 of carboplatin and gemcitabine.  We will proceed with her treatment today.  Please see After Visit Summary for patient specific instructions.  Future Appointments  Date Time Provider [place]  07/30/2020  8:15 AM Derek Jack, MD [facility] None  07/31/2020  8:30 AM [facility] LAB [facility] None  07/31/2020  8:45 AM [facility] [place] FLUSH [facility] None  07/31/2020 11:30 AM [facility] INFUSION [facility] None    No orders of the defined types were placed in this encounter.      Subjective:   Patient ID:  Heidi Johnson is a 50 y.o. (DOB June 04, 1971) female.  Chief Complaint: No chief complaint on file.   HPI Heidi Johnson is a 50 y.o. female with a diagnosis of a metastatic malignant neoplasm of the ovary with a solitary left hip bone lesion and peritoneal carcinomatosis.  She has previously been managed by Dr. [name] but is transitioning to Dr. Delton Coombes at [facility].  She will be seen by him on 07/30/2020.  She presents to the clinic today for consideration of cycle 2, day 1  of carboplatin and gemcitabine.   She has ongoing nausea, weakness, bilateral lower extremity neuropathy , pain in her left hip, and incomplete understanding of her clinical condition.  Her complete oncologic history is as follows:  Oncology History Overview Note   Negative genetics at [facility]   Ovarian cancer ([place])   04/25/2016 Imaging    CT confirms 12cm ovarian masses with extensive peritoneal metastases and malignant pleural effusions    05/05/2016 Surgery    s/p ex-lap posterior exenteration, diaphragm stripping, cholecystectomy, omentectomy, appendectomy and optimal tumor debulking on 05/05/16    06/24/2016 - 10/24/2016 Chemotherapy    Started Taxol/Carboplatin. Dose reduced Taxol with cycle 4 for neuropathy, Taxol discontinued cycle 5    10/24/2016 - 08/20/2018 Anti-estrogen oral therapy    started maintenance letrozole    08/20/2018 -  Chemotherapy    The patient had maintenance Avastin, discontinued due to TIA and uncontrolled hypertension     02/04/2019 Tumor Marker    Patient's tumor was tested for the following markers: CA-125 Results of the tumor marker test revealed 167    02/05/2019 Imaging    Outside CT chest  1. Numerous left predominant calcified pleural metastases and associated small left pleural effusion are unchanged.  2. Unchanged (3.0 cm) (series 3, image 96) left breast mass     02/07/2019 Imaging    Outside Ct abdomen and pelvis 1. Overall stable small volume peritoneal tumor, as described above.  2. Unchanged infrarenal abdominal aortic aneurysm.     02/22/2019 Tumor Marker    Patient's tumor was tested for the following markers: CA-125 Results  of the tumor marker test revealed 164    03/18/2019 Tumor Marker    Patient's tumor was tested for the following markers: CA-125 Results of the tumor marker test revealed 162    04/08/2019 Tumor Marker    Patient's tumor was tested for the following markers: CA-125 Results of the tumor marker test  revealed 164    04/29/2019 Tumor Marker    Patient's tumor was tested for the following markers: CA-125 Results of the tumor marker test revealed 181    05/20/2019 Tumor Marker    Patient's tumor was tested for the following markers: CA-125 Results of the tumor marker test revealed 164    06/10/2019 Tumor Marker    Patient's tumor was tested for the following markers: CA-125 Results of the tumor marker test revealed 152    07/01/2019 Tumor Marker    Patient's tumor was tested for the following markers: CA-125 Results of the tumor marker test revealed 160    07/22/2019 Tumor Marker    Patient's tumor was tested for the following markers: CA-125 Results of the tumor marker test revealed 171    08/12/2019 Tumor Marker    Patient's tumor was tested for the following markers: CA-125 Results of the tumor marker test revealed 173    09/02/2019 Tumor Marker    Patient's tumor was tested for the following markers: CA-125 Results of the tumor marker test revealed 159    09/30/2019 Tumor Marker    Patient's tumor was tested for the following markers: CA-125 Results of the tumor marker test revealed 168    11/08/2019 Imaging    Outside CT imaging 1. Study was performed at outside institution on 11/08/2019 and presented for review on 11/14/2019.  2. Stable calcified and noncalcified left-sided nodular and plaque-like pleural abnormalities with small chronic left pleural effusion compatible with stable treated pleural metastasis. No new or enlarging lesions.  3. No enlarging thoracic lymph nodes.  4. Redemonstration of left breast mass. Correlation with mammography is suggested if not already performed    12/02/2019 Tumor Marker    Patient's tumor was tested for the following markers: CA-125 Results of the tumor marker test revealed 186    12/18/2019 Cancer Staging    Staging form: Ovary, Fallopian Tube, and Primary Peritoneal Carcinoma, AJCC 8th Edition - Clinical stage  from 12/18/2019: FIGO Stage IV (rcT3c, cN0, cM1) - Signed by [name], MD on 04/24/2020    01/09/2020 Tumor Marker    Patient's tumor was tested for the following markers: CA-125 Results of the tumor marker test revealed 184    01/10/2020 Imaging    1. Treated left pleural and peritoneal metastatic disease. Areas of residual soft tissue prominence in the right anatomic pelvis are indeterminate. Comparison with prior exams would be helpful. 2. Hepatomegaly. Low-attenuation lesions in the liver are too small to characterize. Comparison with prior exams would be helpful. 3. Small left fibrothorax. 4. Infrarenal Aortic aneurysm NOS (ICD10-I71.9). Recommend followup by ultrasound in 2 years.  5. Aortic atherosclerosis (ICD10-I70.0). Coronary artery calcification.    03/09/2020 Tumor Marker    Patient's tumor was tested for the following markers: CA-125 Results of the tumor marker test revealed 214    04/01/2020 Tumor Marker    Patient's tumor was tested for the following markers: CA-125 Results of the tumor marker test revealed 245.    04/03/2020 Imaging    1. No acute findings in the abdomen or pelvis. Specifically, no findings to explain the patient's history of left upper quadrant  pain with nausea and vomiting. 2. Stable appearance of calcified omental and mesenteric nodules consistent with treated metastatic disease. Soft tissue fullness seen in the right adnexal region on the prior exam is similar today. 3. Stable 1.6 x 1.2 cm low-density lesion lateral segment left liver. 4. 3.5 cm infrarenal abdominal aortic aneurysm. Recommend follow-up ultrasound every 2 years. This recommendation follows ACR consensus guidelines: White Paper of the ACR Incidental Findings Committee II on Vascular Findings. J Am Coll Radiol 2013JB:6262728. 5. Aortic Atherosclerosis (ICD10-I70.0).    04/15/2020 Imaging    Focus of abnormal uptake is seen involving the greater trochanter of the proximal  left femur which corresponds to sclerotic density seen on prior CT scan and is concerning for metastatic disease.    04/23/2020 Imaging    1. No hip fracture, dislocation or avascular necrosis. 2. A 18 mm bone lesion in the peripheral superior aspect of the left greater trochanter with enhancement on postcontrast imaging. The overall appearance is most concerning for metastatic disease in the setting of known malignancy. 3. 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 4. Degenerative disease with disc height loss at L4-5 and L5-S1 with bilateral facet arthropathy.    06/08/2020 Tumor Marker    Patient's tumor was tested for the following markers: CA-125. Results of the tumor marker test revealed 272.    06/24/2020 Imaging    1. Overall progression of calcified and noncalcified peritoneal surface and omental lesions. 2. Slight progression of disease at the left lung base and involving the pleura. 3. Stable sclerotic bone lesion involving the left greater trochanter. 4. Stable infrarenal abdominal aortic aneurysm.    07/03/2020 -  Chemotherapy     Patient is on Treatment Plan: OVARIAN RECURRENT 3RD LINE CARBOPLATIN D1 / GEMCITABINE D1,8 (4/800) Q21D       07/03/2020 Tumor Marker    Patient's tumor was tested for the following markers: CA-125 Results of the tumor marker test revealed 285.       Medications: I have reviewed the patient's current medications.  Allergies:  Allergies  Allergen Reactions  . Latex Rash and Hives  . Sulfamethoxazole-Trimethoprim Other (See Comments)    Reaction:  Unknown  Other reaction(s): Unknown When 31, dr told her not to take    Past Medical History:  Diagnosis Date  . Cancer ([place])    ovarian cancer  . CHF (congestive heart failure) ([place])   . Hypertension   . Panic attacks   . Stroke [facility])     Past Surgical History:  Procedure Laterality Date  . ABDOMINAL HYSTERECTOMY    . BACK SURGERY    . TUBAL LIGATION      Family History   Problem Relation Age of Onset  . Cancer Maternal Aunt        breast ca/GYN    Social History   Socioeconomic History  . Marital status: Divorced    Spouse name: Not on file  . Number of children: 3  . Years of education: Not on file  . Highest education level: Not on file  Occupational History  . Occupation: retired  Tobacco Use  . Smoking status: Former Smoker    Types: Cigarettes    Quit date: 06/20/2018    Years since quitting: 2.0  . Smokeless tobacco: Never Used  Substance and Sexual Activity  . Alcohol use: No  . Drug use: Yes    Types: Marijuana  . Sexual activity: Yes  Other Topics Concern  . Not on file  Social  History Narrative   Lives alone   Social Determinants of Health   Financial Resource Strain: Not on file  Food Insecurity: Not on file  Transportation Needs: Not on file  Physical Activity: Not on file  Stress: Not on file  Social Connections: Not on file  Intimate Partner Violence: Not on file    Past Medical History, Surgical history, Social history, and Family history were reviewed and updated as appropriate.   Please see review of systems for further details on the patient's review from today.   Review of Systems:  Review of Systems  Constitutional: Negative for chills, diaphoresis and fever.  HENT: Negative for trouble swallowing and voice change.   Respiratory: Negative for cough, chest tightness, shortness of breath and wheezing.   Cardiovascular: Negative for chest pain and palpitations.  Gastrointestinal: Positive for nausea. Negative for abdominal pain, constipation, diarrhea and vomiting.  Musculoskeletal: Positive for arthralgias and gait problem. Negative for back pain and myalgias.  Neurological: Positive for weakness. Negative for dizziness, light-headedness and headaches.    Objective:   Physical Exam:  BP (!) 145/89 (BP Location: Right Arm, Patient Position: Sitting)   Pulse (!) 106   Temp (!) 97 F (36.1 C) (Tympanic)    Resp 20   Ht 5\' 9"  (1.753 m)   Wt 196 lb 4.8 oz (89 kg)   SpO2 100%   BMI 28.99 kg/m  ECOG: 1  Physical Exam Constitutional:      General: She is not in acute distress.    Appearance: She is not diaphoretic.  HENT:     Head: Normocephalic and atraumatic.  Eyes:     General: No scleral icterus.       Right eye: No discharge.        Left eye: No discharge.     Conjunctiva/sclera: Conjunctivae normal.  Cardiovascular:     Rate and Rhythm: Normal rate and regular rhythm.     Heart sounds: Normal heart sounds. No murmur heard. No friction rub. No gallop.   Pulmonary:     Effort: Pulmonary effort is normal. No respiratory distress.     Breath sounds: Normal breath sounds. No wheezing or rales.  Skin:    General: Skin is warm and dry.     Findings: No erythema or rash.  Neurological:     Mental Status: She is alert.     Gait: Gait abnormal (The patient is ambulating with the use of a rolling walker.).     Lab Review:     Component Value Date/Time   NA 140 07/23/2020 1443   K 3.8 07/23/2020 1443   CL 103 07/23/2020 1443   CO2 26 07/23/2020 1443   GLUCOSE 142 (H) 07/23/2020 1443   BUN 5 (L) 07/23/2020 1443   CREATININE 0.74 07/23/2020 1443   CREATININE 0.83 05/18/2020 1313   CALCIUM 9.4 07/23/2020 1443   PROT 7.0 07/23/2020 1443   ALBUMIN 3.6 07/23/2020 1443   AST 60 (H) 07/23/2020 1443   AST 21 05/18/2020 1313   ALT 96 (H) 07/23/2020 1443   ALT 33 05/18/2020 1313   ALKPHOS 110 07/23/2020 1443   BILITOT 0.3 07/23/2020 1443   BILITOT 0.6 05/18/2020 1313   GFRNONAA >60 07/23/2020 1443   GFRNONAA >60 05/18/2020 1313   GFRAA >60 03/09/2020 1005   GFRAA >60 01/09/2020 1404       Component Value Date/Time   WBC 6.4 07/23/2020 1443   RBC 3.98 07/23/2020 1443   HGB 11.9 (L) 07/23/2020 1443  HGB 16.2 (H) 05/18/2020 1313   HCT 36.5 07/23/2020 1443   PLT 470 (H) 07/23/2020 1443   PLT 205 05/18/2020 1313   MCV 91.7 07/23/2020 1443   MCH 29.9 07/23/2020 1443   MCHC  32.6 07/23/2020 1443   RDW 16.4 (H) 07/23/2020 1443   LYMPHSABS 1.9 07/23/2020 1443   MONOABS 0.8 07/23/2020 1443   EOSABS 0.1 07/23/2020 1443   BASOSABS 0.1 07/23/2020 1443   -------------------------------  Imaging from last 24 hours (if applicable):  Radiology interpretation: No results found.    OK to treat.  Sandi Mealy, MHS, PA-C Physician Assistant

## 2020-07-24 NOTE — Patient Instructions (Signed)
[facility] Discharge Instructions for Patients Receiving Chemotherapy  Today you received the following chemotherapy agents: Gemcitabine (Gemzar) and Carboplatin.  To help prevent nausea and vomiting after your treatment, we encourage you to take your nausea medication as directed by your MD.   If you develop nausea and vomiting that is not controlled by your nausea medication, call the clinic.   BELOW ARE SYMPTOMS THAT SHOULD BE REPORTED IMMEDIATELY:  *FEVER GREATER THAN 100.5 F  *CHILLS WITH OR WITHOUT FEVER  NAUSEA AND VOMITING THAT IS NOT CONTROLLED WITH YOUR NAUSEA MEDICATION  *UNUSUAL SHORTNESS OF BREATH  *UNUSUAL BRUISING OR BLEEDING  TENDERNESS IN MOUTH AND THROAT WITH OR WITHOUT PRESENCE OF ULCERS  *URINARY PROBLEMS  *BOWEL PROBLEMS  UNUSUAL RASH Items with * indicate a potential emergency and should be followed up as soon as possible.  Feel free to call the clinic should you have any questions or concerns. The clinic phone number is [phone].  Please show the [facility] at check-in to the Emergency Department and triage nurse.  Gemcitabine injection What is this medicine? GEMCITABINE (jem SYE ta been) is a chemotherapy drug. This medicine is used to treat many types of cancer like breast cancer, lung cancer, pancreatic cancer, and ovarian cancer. This medicine may be used for other purposes; ask your health care provider or pharmacist if you have questions. COMMON BRAND NAME(S): Gemzar, Infugem What should I tell my health care provider before I take this medicine? They need to know if you have any of these conditions:  blood disorders  infection  kidney disease  liver disease  lung or breathing disease, like asthma  recent or ongoing radiation therapy  an unusual or allergic reaction to gemcitabine, other chemotherapy, other medicines, foods, dyes, or preservatives  pregnant or trying to get pregnant  breast-feeding How  should I use this medicine? This drug is given as an infusion into a vein. It is administered in a hospital or clinic by a specially trained health care professional. Talk to your pediatrician regarding the use of this medicine in children. Special care may be needed. Overdosage: If you think you have taken too much of this medicine contact a poison control center or emergency room at once. NOTE: This medicine is only for you. Do not share this medicine with others. What if I miss a dose? It is important not to miss your dose. Call your doctor or health care professional if you are unable to keep an appointment. What may interact with this medicine?  medicines to increase blood counts like filgrastim, pegfilgrastim, sargramostim  some other chemotherapy drugs like cisplatin  vaccines Talk to your doctor or health care professional before taking any of these medicines:  acetaminophen  aspirin  ibuprofen  ketoprofen  naproxen This list may not describe all possible interactions. Give your health care provider a list of all the medicines, herbs, non-prescription drugs, or dietary supplements you use. Also tell them if you smoke, drink alcohol, or use illegal drugs. Some items may interact with your medicine. What should I watch for while using this medicine? Visit your doctor for checks on your progress. This drug may make you feel generally unwell. This is not uncommon, as chemotherapy can affect healthy cells as well as cancer cells. Report any side effects. Continue your course of treatment even though you feel ill unless your doctor tells you to stop. In some cases, you may be given additional medicines to help with side effects. Follow  all directions for their use. Call your doctor or health care professional for advice if you get a fever, chills or sore throat, or other symptoms of a cold or flu. Do not treat yourself. This drug decreases your body's ability to fight infections. Try  to avoid being around people who are sick. This medicine may increase your risk to bruise or bleed. Call your doctor or health care professional if you notice any unusual bleeding. Be careful brushing and flossing your teeth or using a toothpick because you may get an infection or bleed more easily. If you have any dental work done, tell your dentist you are receiving this medicine. Avoid taking products that contain aspirin, acetaminophen, ibuprofen, naproxen, or ketoprofen unless instructed by your doctor. These medicines may hide a fever. Do not become pregnant while taking this medicine or for 6 months after stopping it. Women should inform their doctor if they wish to become pregnant or think they might be pregnant. Men should not father a child while taking this medicine and for 3 months after stopping it. There is a potential for serious side effects to an unborn child. Talk to your health care professional or pharmacist for more information. Do not breast-feed an infant while taking this medicine or for at least 1 week after stopping it. Men should inform their doctors if they wish to father a child. This medicine may lower sperm counts. Talk with your doctor or health care professional if you are concerned about your fertility. What side effects may I notice from receiving this medicine? Side effects that you should report to your doctor or health care professional as soon as possible:  allergic reactions like skin rash, itching or hives, swelling of the face, lips, or tongue  breathing problems  pain, redness, or irritation at site where injected  signs and symptoms of a dangerous change in heartbeat or heart rhythm like chest pain; dizziness; fast or irregular heartbeat; palpitations; feeling faint or lightheaded, falls; breathing problems  signs of decreased platelets or bleeding - bruising, pinpoint red spots on the skin, black, tarry stools, blood in the urine  signs of decreased red  blood cells - unusually weak or tired, feeling faint or lightheaded, falls  signs of infection - fever or chills, cough, sore throat, pain or difficulty passing urine  signs and symptoms of kidney injury like trouble passing urine or change in the amount of urine  signs and symptoms of liver injury like dark yellow or brown urine; general ill feeling or flu-like symptoms; light-colored stools; loss of appetite; nausea; right upper belly pain; unusually weak or tired; yellowing of the eyes or skin  swelling of ankles, feet, hands Side effects that usually do not require medical attention (report to your doctor or health care professional if they continue or are bothersome):  constipation  diarrhea  hair loss  loss of appetite  nausea  rash  vomiting This list may not describe all possible side effects. Call your doctor for medical advice about side effects. You may report side effects to FDA at 1-800-FDA-1088. Where should I keep my medicine? This drug is given in a hospital or clinic and will not be stored at home. NOTE: This sheet is a summary. It may not cover all possible information. If you have questions about this medicine, talk to your doctor, pharmacist, or health care provider.  2021 [employer] Standard (2017-08-30 18:06:11)  Carboplatin injection What is this medicine? CARBOPLATIN (KAR boe pla tin) is a chemotherapy  drug. It targets fast dividing cells, like cancer cells, and causes these cells to die. This medicine is used to treat ovarian cancer and many other cancers. This medicine may be used for other purposes; ask your health care provider or pharmacist if you have questions. COMMON BRAND NAME(S): Paraplatin What should I tell my health care provider before I take this medicine? They need to know if you have any of these conditions:  blood disorders  hearing problems  kidney disease  recent or ongoing radiation therapy  an unusual or allergic reaction  to carboplatin, cisplatin, other chemotherapy, other medicines, foods, dyes, or preservatives  pregnant or trying to get pregnant  breast-feeding How should I use this medicine? This drug is usually given as an infusion into a vein. It is administered in a hospital or clinic by a specially trained health care professional. Talk to your pediatrician regarding the use of this medicine in children. Special care may be needed. Overdosage: If you think you have taken too much of this medicine contact a poison control center or emergency room at once. NOTE: This medicine is only for you. Do not share this medicine with others. What if I miss a dose? It is important not to miss a dose. Call your doctor or health care professional if you are unable to keep an appointment. What may interact with this medicine?  medicines for seizures  medicines to increase blood counts like filgrastim, pegfilgrastim, sargramostim  some antibiotics like amikacin, gentamicin, neomycin, streptomycin, tobramycin  vaccines Talk to your doctor or health care professional before taking any of these medicines:  acetaminophen  aspirin  ibuprofen  ketoprofen  naproxen This list may not describe all possible interactions. Give your health care provider a list of all the medicines, herbs, non-prescription drugs, or dietary supplements you use. Also tell them if you smoke, drink alcohol, or use illegal drugs. Some items may interact with your medicine. What should I watch for while using this medicine? Your condition will be monitored carefully while you are receiving this medicine. You will need important blood work done while you are taking this medicine. This drug may make you feel generally unwell. This is not uncommon, as chemotherapy can affect healthy cells as well as cancer cells. Report any side effects. Continue your course of treatment even though you feel ill unless your doctor tells you to stop. In some  cases, you may be given additional medicines to help with side effects. Follow all directions for their use. Call your doctor or health care professional for advice if you get a fever, chills or sore throat, or other symptoms of a cold or flu. Do not treat yourself. This drug decreases your body's ability to fight infections. Try to avoid being around people who are sick. This medicine may increase your risk to bruise or bleed. Call your doctor or health care professional if you notice any unusual bleeding. Be careful brushing and flossing your teeth or using a toothpick because you may get an infection or bleed more easily. If you have any dental work done, tell your dentist you are receiving this medicine. Avoid taking products that contain aspirin, acetaminophen, ibuprofen, naproxen, or ketoprofen unless instructed by your doctor. These medicines may hide a fever. Do not become pregnant while taking this medicine. Women should inform their doctor if they wish to become pregnant or think they might be pregnant. There is a potential for serious side effects to an unborn child. Talk to your health  care professional or pharmacist for more information. Do not breast-feed an infant while taking this medicine. What side effects may I notice from receiving this medicine? Side effects that you should report to your doctor or health care professional as soon as possible:  allergic reactions like skin rash, itching or hives, swelling of the face, lips, or tongue  signs of infection - fever or chills, cough, sore throat, pain or difficulty passing urine  signs of decreased platelets or bleeding - bruising, pinpoint red spots on the skin, black, tarry stools, nosebleeds  signs of decreased red blood cells - unusually weak or tired, fainting spells, lightheadedness  breathing problems  changes in hearing  changes in vision  chest pain  high blood pressure  low blood counts - This drug may decrease  the number of white blood cells, red blood cells and platelets. You may be at increased risk for infections and bleeding.  nausea and vomiting  pain, swelling, redness or irritation at the injection site  pain, tingling, numbness in the hands or feet  problems with balance, talking, walking  trouble passing urine or change in the amount of urine Side effects that usually do not require medical attention (report to your doctor or health care professional if they continue or are bothersome):  hair loss  loss of appetite  metallic taste in the mouth or changes in taste This list may not describe all possible side effects. Call your doctor for medical advice about side effects. You may report side effects to FDA at 1-800-FDA-1088. Where should I keep my medicine? This drug is given in a hospital or clinic and will not be stored at home. NOTE: This sheet is a summary. It may not cover all possible information. If you have questions about this medicine, talk to your doctor, pharmacist, or health care provider.  2021 [employer]/Gold Standard (2007-09-11 14:38:05)

## 2020-07-24 NOTE — Telephone Encounter (Signed)
Heidi Johnson called and asked what her CA 125 results are from yesterday.  She is not able to see them in MyChart.  Advised her of results and she verbalized understanding.

## 2020-07-24 NOTE — Progress Notes (Signed)
Patient assessed by Sandi Mealy, PA only.

## 2020-07-29 ENCOUNTER — Other Ambulatory Visit: Payer: Self-pay | Admitting: Hematology and Oncology

## 2020-07-29 ENCOUNTER — Telehealth: Payer: Self-pay | Admitting: Oncology

## 2020-07-29 MED ORDER — HYDROMORPHONE HCL 4 MG PO TABS: 4.0000 mg | ORAL_TABLET | ORAL | 0 refills | Status: DC | PRN

## 2020-07-29 NOTE — Telephone Encounter (Signed)
done

## 2020-07-29 NOTE — Telephone Encounter (Signed)
Heidi Johnson left a message saying that she has 5 tablets of dilaudid left and is wondering if Dr. Alvy Bimler will fill it or if she needs to wait until she sees Dr. Delton Coombes tomorrow.

## 2020-07-29 NOTE — Telephone Encounter (Signed)
She would like it sent to [facility] in [place].

## 2020-07-29 NOTE — Telephone Encounter (Signed)
I will refill it Which pharmacy>?

## 2020-07-30 ENCOUNTER — Inpatient Hospital Stay (HOSPITAL_COMMUNITY): Payer: Medicare Other | Attending: Hematology | Admitting: Hematology

## 2020-07-30 ENCOUNTER — Encounter (HOSPITAL_COMMUNITY): Payer: Self-pay

## 2020-07-30 ENCOUNTER — Other Ambulatory Visit: Payer: Self-pay | Admitting: Hematology and Oncology

## 2020-07-30 ENCOUNTER — Other Ambulatory Visit: Payer: Self-pay

## 2020-07-30 VITALS — BP 113/88 | HR 64 | Temp 98.2°F | Resp 17 | Wt 191.0 lb

## 2020-07-30 DIAGNOSIS — I10 Essential (primary) hypertension: Secondary | ICD-10-CM | POA: Insufficient documentation

## 2020-07-30 DIAGNOSIS — Z8673 Personal history of transient ischemic attack (TIA), and cerebral infarction without residual deficits: Secondary | ICD-10-CM | POA: Insufficient documentation

## 2020-07-30 DIAGNOSIS — F410 Panic disorder [episodic paroxysmal anxiety] without agoraphobia: Secondary | ICD-10-CM | POA: Insufficient documentation

## 2020-07-30 DIAGNOSIS — I714 Abdominal aortic aneurysm, without rupture: Secondary | ICD-10-CM | POA: Insufficient documentation

## 2020-07-30 DIAGNOSIS — C569 Malignant neoplasm of unspecified ovary: Secondary | ICD-10-CM | POA: Diagnosis not present

## 2020-07-30 DIAGNOSIS — C786 Secondary malignant neoplasm of retroperitoneum and peritoneum: Secondary | ICD-10-CM | POA: Diagnosis not present

## 2020-07-30 DIAGNOSIS — Z79899 Other long term (current) drug therapy: Secondary | ICD-10-CM | POA: Insufficient documentation

## 2020-07-30 DIAGNOSIS — I509 Heart failure, unspecified: Secondary | ICD-10-CM | POA: Diagnosis not present

## 2020-07-30 DIAGNOSIS — Z87891 Personal history of nicotine dependence: Secondary | ICD-10-CM | POA: Diagnosis not present

## 2020-07-30 DIAGNOSIS — Z5111 Encounter for antineoplastic chemotherapy: Secondary | ICD-10-CM | POA: Insufficient documentation

## 2020-07-30 NOTE — Progress Notes (Signed)
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 female is here because of evaluation of ovarian cancer, at the request of Dr. [name] from [facility]. She has started chemo with carboplatin and gemcitabine on 07/03/2020. She also received radiation to her left femur, 30 Gy in 10 fractions, from 05/13/2020 to 05/20/2020.  Today she reports feeling fair. She reports that she was diagnosed with ovarian cancer in 2018 in [facility] in [place]. Dr. Tarri Glenn in [place] then did endometrial ablation and Dr. Carrolyn Leigh did her surgery in 04/2017. She reports that she had genetic testing done in [place] which was negative. 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 Since the new chemo she notes that she feels fatigued, N/V with vomiting for the last 2 days treated with Compazine and Zofran ODT which sometimes help. She is taking MS Contin TID and Dilaudid every 4 hours for her back pain and gabapentin 900 mg TID for neuropathy; she denies having drowsiness with gabapentin. She notes that after finishing radiation, she became swollen up in her abdomen and thinks that her cancer worsened. She saw Dr. Jimmye Norman, the vascular surgeon, in [place] for her AAA. She reports feeling cold often which she attributes to being menopausal. She is trying to eat smaller meals more often and is drinking Ensure. She was just prescribed weekly vitamin D. She was with palliative care in [place] since 2018 until Dr. Alvy Bimler took her off.  She denies having any family history of cancer, though she does not know about her father's side of the  family. She lives in [place] by herself. 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 She quit smoking in 2020 after smoking 1/2 PPD on-and-off. She has 3 children, 2 daughters and 1 son, and 10 grandchildren. She uses a rollator to ambulate but her legs get fatigued after just a few steps. She is able to do her chores at home, though she recently hired a Runner, broadcasting/film/video to come in and help with her cooking. She is driving on her own, though she is trying to arrange Medicare transportation to assist with her transportation. She is participating in the [employer] for her supportive care.  She is due for day #8 of cycle #2 of gemcitabine tomorrow.  MEDICAL HISTORY:  Past Medical History:  Diagnosis Date  . Cancer ([place])    ovarian cancer  . CHF (congestive heart failure) ([place])   . Hypertension   . Panic attacks   . Stroke [facility])     SURGICAL HISTORY: Past Surgical History:  Procedure Laterality Date  . ABDOMINAL HYSTERECTOMY    . BACK SURGERY    . TUBAL LIGATION      SOCIAL HISTORY: Social History   Socioeconomic History  . Marital status: Divorced    Spouse name: Not on file  . Number of children: 3  . Years of education: Not on file  . Highest education level: Not on file  Occupational History  . Occupation: retired  Tobacco Use  . Smoking status: Former Smoker    Types: Cigarettes  Quit date: 06/20/2018    Years since quitting: 2.1  . Smokeless tobacco: Never Used  Substance and Sexual Activity  . Alcohol use: No  . Drug use: Yes    Types: Marijuana  . Sexual activity: Yes  Other Topics Concern  . Not on file  Social History Narrative   Lives alone   Social Determinants of Health   Financial Resource Strain: High Risk  . Difficulty of Paying Living Expenses: Hard  Food Insecurity: No Food Insecurity  . Worried About Charity fundraiser in the Last Year: Never true  . Ran Out of Food in the Last  Year: Never true  Transportation Needs: No Transportation Needs  . Lack of Transportation (Medical): No  . Lack of Transportation (Non-Medical): No  Physical Activity: Inactive  . Days of Exercise per Week: 0 days  . Minutes of Exercise per Session: 0 min  Stress: No Stress Concern Present  . Feeling of Stress : Only a little  Social Connections: Moderately Isolated  . Frequency of Communication with Friends and Family: Twice a week  . Frequency of Social Gatherings with Friends and Family: Twice a week  . Attends Religious Services: 1 to 4 times per year  . Active Member of Clubs or Organizations: No  . Attends Archivist Meetings: Never  . Marital Status: Divorced  Human resources officer Violence: Not At Risk  . Fear of Current or Ex-Partner: No  . Emotionally Abused: No  . Physically Abused: No  . Sexually Abused: No    FAMILY HISTORY: Family History  Problem Relation Age of Onset  . Cancer Maternal Aunt        breast ca/GYN    ALLERGIES:  is allergic to latex and sulfamethoxazole-trimethoprim.  MEDICATIONS:  Current Outpatient Medications  Medication Sig Dispense Refill  . albuterol (VENTOLIN HFA) 108 (90 Base) MCG/ACT inhaler Inhale 2 puffs into the lungs as needed. 2 puffs into lungs every 6 hours as needed for wheezing or SOB    . aspirin 325 MG EC tablet Take 325 mg by mouth daily.    . clonazePAM (KLONOPIN) 1 MG tablet Take 1 mg by mouth 3 (three) times daily as needed for anxiety.    . cyclobenzaprine (FLEXERIL) 5 MG tablet TAKE 1 TABLET (5 MG TOTAL) BY MOUTH 3 (THREE) TIMES DAILY AS NEEDED. 60 tablet 1  . docusate sodium (COLACE) 100 MG capsule Take 100 mg by mouth 2 (two) times daily.    . ergocalciferol (VITAMIN D2) 1.25 MG (50000 UT) capsule Take 1 capsule (50,000 Units total) by mouth once a week. 12 capsule 1  . gabapentin (NEURONTIN) 300 MG capsule Take 3 capsules (900 mg total) by mouth 3 (three) times daily. 270 capsule 4  . hydrochlorothiazide  (HYDRODIURIL) 25 MG tablet Take 25 mg by mouth daily.    Marland Kitchen HYDROmorphone (DILAUDID) 4 MG tablet Take 1 tablet (4 mg total) by mouth every 4 (four) hours as needed for severe pain. 90 tablet 0  . ibuprofen (ADVIL) 600 MG tablet Take 600 mg by mouth every 6 (six) hours as needed.    . lidocaine-prilocaine (EMLA) cream Apply 1 application topically as needed (port).     Marland Kitchen lisinopril-hydrochlorothiazide (ZESTORETIC) 20-25 MG tablet Take 1 tablet by mouth 2 (two) times daily.    Marland Kitchen MARINOL 5 MG capsule Take 5 mg by mouth 3 (three) times daily.    Marland Kitchen morphine (MS CONTIN) 30 MG 12 hr tablet Take 1 tablet (30 mg total) by  mouth every 8 (eight) hours. 90 tablet 0  . naloxone (NARCAN) 2 MG/2ML injection Place 2 mg into the nose as needed.     Marland Kitchen OLANZapine (ZYPREXA) 10 MG tablet Take 10 mg by mouth at bedtime.    . ondansetron (ZOFRAN-ODT) 8 MG disintegrating tablet Take 1 tablet (8 mg total) by mouth every 8 (eight) hours as needed for nausea or vomiting. 60 tablet 1  . polyethylene glycol (MIRALAX / GLYCOLAX) 17 g packet Take 17 g by mouth 2 (two) times daily. 100 each 11  . prochlorperazine (COMPAZINE) 10 MG tablet Take 1 tablet (10 mg total) by mouth every 6 (six) hours as needed (Nausea or vomiting). 30 tablet 1  . promethazine (PHENERGAN) 12.5 MG tablet Take 1 tablet (12.5 mg total) by mouth every 6 (six) hours as needed for nausea or vomiting. 30 tablet 2  . rosuvastatin (CRESTOR) 40 MG tablet Take 40 mg by mouth daily.    Marland Kitchen senna (SENOKOT) 8.6 MG tablet Take 2 tablets (17.2 mg total) by mouth 3 (three) times daily. 90 tablet 1   No current facility-administered medications for this visit.   Facility-Administered Medications Ordered in Other Visits  Medication Dose Route Frequency Provider Last Rate Last Admin  . heparin lock flush 100 unit/mL  500 Units Intracatheter Once Alvy Bimler, Ni, MD        REVIEW OF SYSTEMS:   Review of Systems  Constitutional: Positive for appetite change (25%) and fatigue  (50%).  Cardiovascular: Positive for palpitations.  Gastrointestinal: Positive for constipation, nausea and vomiting.  Neurological: Positive for numbness (7/10 neuropathy in legs & hands).  All other systems reviewed and are negative.    PHYSICAL EXAMINATION: ECOG PERFORMANCE STATUS: 1 - Symptomatic but completely ambulatory  Vitals:   07/30/20 0828  BP: 113/88  Pulse: 64  Resp: 17  Temp: 98.2 F (36.8 C)  SpO2: 98%   Filed Weights   07/30/20 0828  Weight: 191 lb (86.6 kg)   Physical Exam Vitals reviewed.  Constitutional:      Appearance: Normal appearance.  Cardiovascular:     Rate and Rhythm: Normal rate and regular rhythm.     Pulses: Normal pulses.     Heart sounds: Normal heart sounds.  Pulmonary:     Effort: Pulmonary effort is normal.     Breath sounds: Normal breath sounds.  Chest:  Breasts:     Right: No supraclavicular adenopathy.     Left: No supraclavicular adenopathy.      Comments: Port-a-Cath in L chest Abdominal:     Palpations: Abdomen is soft. There is no hepatomegaly, splenomegaly or mass.     Tenderness: There is no abdominal tenderness.     Hernia: No hernia is present.  Musculoskeletal:     Right lower leg: No edema.     Left lower leg: No edema.  Lymphadenopathy:     Cervical: No cervical adenopathy.     Upper Body:     Right upper body: No supraclavicular adenopathy.     Left upper body: No supraclavicular adenopathy.  Neurological:     General: No focal deficit present.     Mental Status: She is alert and oriented to person, place, and time.  Psychiatric:        Mood and Affect: Mood normal.        Behavior: Behavior normal.      LABORATORY DATA:  I have reviewed the data as listed CBC Latest Ref Rng & Units 07/23/2020 07/10/2020 07/03/2020  WBC 4.0 - 10.5 K/uL 6.4 5.8 9.1  Hemoglobin 12.0 - 15.0 g/dL 11.9(L) 11.7(L) 13.0  Hematocrit 36.0 - 46.0 % 36.5 35.9(L) 38.8  Platelets 150 - 400 K/uL 470(H) 174 175   CMP Latest Ref  Rng & Units 07/23/2020 07/10/2020 07/03/2020  Glucose 70 - 99 mg/dL 142(H) 158(H) 114(H)  BUN 6 - 20 mg/dL 5(L) 13 15  Creatinine 0.44 - 1.00 mg/dL 0.74 0.72 0.70  Sodium 135 - 145 mmol/L 140 133(L) 134(L)  Potassium 3.5 - 5.1 mmol/L 3.8 3.5 3.6  Chloride 98 - 111 mmol/L 103 98 100  CO2 22 - 32 mmol/L 26 24 25   Calcium 8.9 - 10.3 mg/dL 9.4 8.6(L) 8.7(L)  Total Protein 6.5 - 8.1 g/dL 7.0 6.4(L) 6.5  Total Bilirubin 0.3 - 1.2 mg/dL 0.3 0.3 0.7  Alkaline Phos 38 - 126 U/L 110 89 100  AST 15 - 41 U/L 60(H) 18 20  ALT 0 - 44 U/L 96(H) 31 38   Lab Results  Component Value Date   VD25OH 13.85 (L) 07/10/2020   CA125 308.0 (H) 07/23/2020  CA125 332.0 (H) 07/10/2020  CA125 284.0 (H) 07/03/2020    RADIOGRAPHIC STUDIES: I have personally reviewed the radiological images as listed and agreed with the findings in the report. No results found.  ASSESSMENT:  1.  Stage IVa low-grade ovarian cancer: -05/05/2016-exploratory laparotomy, posterior exenteration, argon beam ablation of peritoneum, diaphragm stripping, cholecystectomy, omentectomy, appendectomy and optimal tumor debulking by Dr. Carrolyn Leigh at [facility]. -06/24/2016: Started Taxol/carboplatin.  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  Taxol discontinued cycle 5. -10/24/2016: Started maintenance letrozole. -March 2020 bevacizumab started and completed 22 cycles until 12/02/2019, discontinued due to TIA and uncontrolled hypertension. -Germline mutation testing reportedly done at [facility] was negative. -She is under the care of Dr. Alvy Bimler from July 2021 -CTAP on 04/03/2020 with stable appearance of calcified omental and mesenteric nodules.  Soft tissue fullness in the right adnexal region is stable. -MRI of the left hip on 04/23/2020 with 18 mm bone lesion in the peripheral superior aspect of the left greater trochanter -XRT to left femur lesion, 30 Gray from 05/06/2020 through 05/20/2020. -CTAP on 06/24/2020 with overall progression of calcified and  noncalcified peritoneal surface and omental lesions with slight progression of disease at the left lung base involving the pleura.  Stable sclerotic bone lesion involving the left greater trochanter. -Cycle 1 of carboplatin and gemcitabine started on 07/03/2020 -Cycle 2 of carboplatin and gemcitabine on 07/24/2020.  2.  Social/family history: -Lives by herself.  Worked as a Land.  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  Quit smoking 2 years ago.  3.  Abdominal aneurysm: -CTAP on 06/24/2020 shows stable 3.4 x 3.3 cm infrarenal abdominal aortic aneurysm. -Used to follow with vascular surgery at [facility].    PLAN:  1.  Stage IVa low-grade ovarian cancer: -I have reviewed scans with her.  All her questions were answered to her satisfaction. -I agree with the treatment plan initiated by Dr. Alvy Bimler. -She will come back tomorrow for cycle 2-day 8. -I plan to repeat CT scan after 3 cycles.  We will also obtain CA-125 prior to each cycle. -She is tolerating her chemotherapy reasonably well although has some nausea and fatigue. -Plan to see her back prior to cycle 3. -We will obtain germline mutation testing results from [facility].  We will also consider somatic mutation testing.  2.  Back pain/neuropathy: -She is taking MS Contin 30 mg every 8  hours.  She takes Dilaudid 4 mg every 4 hours as needed for breakthrough. -She developed neuropathy in the legs and hands from prior paclitaxel.  She is also taking gabapentin 900 mg 3 times a day.  This is fairly well controlled.  3.  Hypertension: -Continue lisinopril/HCTZ daily.  4.  Nausea/vomiting: -Continue Compazine and Phenergan as needed.  She will also use Zofran as needed.    All questions were answered. The patient knows to call the clinic with any problems, questions or concerns.   Derek Jack, MD, 07/30/20 9:15 AM  [place] [phone]   I, Milinda Antis, am acting as a scribe for Dr.  Sanda Linger.  I, Derek Jack MD, have reviewed the above documentation for accuracy and completeness, and I agree with the above.

## 2020-07-30 NOTE — Patient Instructions (Signed)
[place] at [facility] Discharge Instructions  You were seen and examined today by Dr. Delton Coombes. Dr. Delton Coombes is a medical oncologist, meaning he specializes in the management of cancer diagnoses with medications. Dr. Delton Coombes dicussed your past medical history, family history of cancer and the events that led to you being here today.   Our office will request Foundation One Results (this is additional testing that may have been performed on the biopsy tissue - this is to see if there are any treatable mutations present) as well as Genetic Testing results (this is a blood test to know if there is a genetic link to your type of cancer, if positive, this could be helpful for your family and additional treatment options for you).  As you know, you have been diagnosed with Ovarian Cancer. Dr. Delton Coombes discussed how you are tolerating your current course of treatment. Dr. Delton Coombes is in agreement with your current treatment regimen. You can receive this treatment for as long as it helps. Because your cancer cannot be cured, you will need to be on treatment for your lifetime.  We can continue your current course of treatment here.   Thank you for choosing [place] at [facility] to provide your oncology and hematology care.  To afford each patient quality time with our provider, please arrive at least 15 minutes before your scheduled appointment time.   If you have a lab appointment with the [facility] please come in thru the Main Entrance and check in at the main information desk.  You need to re-schedule your appointment should you arrive 10 or more minutes late.  We strive to give you quality time with our providers, and arriving late affects you and other patients whose appointments are after yours.  Also, if you no show three or more times for appointments you may be dismissed from the clinic at the providers discretion.     Again, thank  you for choosing [facility].  Our hope is that these requests will decrease the amount of time that you wait before being seen by our physicians.       _____________________________________________________________  Should you have questions after your visit to [facility], please contact our office at [phone] and follow the prompts.  Our office hours are 8:00 a.m. and 4:30 p.m. Monday - Friday.  Please note that voicemails left after 4:00 p.m. may not be returned until the following business day.  We are closed weekends and major holidays.  You do have access to a nurse 24-7, just call the main number to the clinic [phone] and do not press any options, hold on the line and a nurse will answer the phone.    For prescription refill requests, have your pharmacy contact our office and allow 72 hours.    Due to Covid, you will need to wear a mask upon entering the hospital. If you do not have a mask, a mask will be given to you at the Main Entrance upon arrival. For doctor visits, patients may have 1 support person age 44 or older with them. For treatment visits, patients can not have anyone with them due to social distancing guidelines and our immunocompromised population.

## 2020-07-30 NOTE — Progress Notes (Signed)
Call placed to [facility] who reports that they do have Foundation One and Genetic testing results on file. Written request faxed for those results per Dr. Delton Coombes

## 2020-07-31 ENCOUNTER — Inpatient Hospital Stay: Payer: Medicare Other

## 2020-07-31 ENCOUNTER — Inpatient Hospital Stay (HOSPITAL_COMMUNITY): Payer: Medicare Other

## 2020-07-31 ENCOUNTER — Encounter (HOSPITAL_COMMUNITY): Payer: Self-pay

## 2020-07-31 ENCOUNTER — Encounter (HOSPITAL_COMMUNITY): Payer: Self-pay | Admitting: *Deleted

## 2020-07-31 ENCOUNTER — Ambulatory Visit: Payer: Medicare Other | Admitting: Hematology and Oncology

## 2020-07-31 ENCOUNTER — Ambulatory Visit: Payer: Medicare Other

## 2020-07-31 VITALS — BP 145/83 | HR 91 | Temp 97.0°F | Resp 18 | Wt 190.5 lb

## 2020-07-31 DIAGNOSIS — C569 Malignant neoplasm of unspecified ovary: Secondary | ICD-10-CM

## 2020-07-31 DIAGNOSIS — Z7189 Other specified counseling: Secondary | ICD-10-CM

## 2020-07-31 DIAGNOSIS — Z5111 Encounter for antineoplastic chemotherapy: Secondary | ICD-10-CM | POA: Diagnosis not present

## 2020-07-31 DIAGNOSIS — C786 Secondary malignant neoplasm of retroperitoneum and peritoneum: Secondary | ICD-10-CM

## 2020-07-31 LAB — CBC WITH DIFFERENTIAL/PLATELET
Abs Immature Granulocytes: 0.11 10*3/uL — ABNORMAL HIGH (ref 0.00–0.07)
Basophils Absolute: 0.1 10*3/uL (ref 0.0–0.1)
Basophils Relative: 2 %
Eosinophils Absolute: 0 10*3/uL (ref 0.0–0.5)
Eosinophils Relative: 1 %
HCT: 35.4 % — ABNORMAL LOW (ref 36.0–46.0)
Hemoglobin: 11.4 g/dL — ABNORMAL LOW (ref 12.0–15.0)
Immature Granulocytes: 2 %
Lymphocytes Relative: 29 %
Lymphs Abs: 2 10*3/uL (ref 0.7–4.0)
MCH: 30.4 pg (ref 26.0–34.0)
MCHC: 32.2 g/dL (ref 30.0–36.0)
MCV: 94.4 fL (ref 80.0–100.0)
Monocytes Absolute: 0.6 10*3/uL (ref 0.1–1.0)
Monocytes Relative: 8 %
Neutro Abs: 3.9 10*3/uL (ref 1.7–7.7)
Neutrophils Relative %: 58 %
Platelets: 291 10*3/uL (ref 150–400)
RBC: 3.75 MIL/uL — ABNORMAL LOW (ref 3.87–5.11)
RDW: 16 % — ABNORMAL HIGH (ref 11.5–15.5)
WBC: 6.7 10*3/uL (ref 4.0–10.5)
nRBC: 0 % (ref 0.0–0.2)

## 2020-07-31 LAB — COMPREHENSIVE METABOLIC PANEL
ALT: 69 U/L — ABNORMAL HIGH (ref 0–44)
AST: 41 U/L (ref 15–41)
Albumin: 3.4 g/dL — ABNORMAL LOW (ref 3.5–5.0)
Alkaline Phosphatase: 91 U/L (ref 38–126)
Anion gap: 10 (ref 5–15)
BUN: 27 mg/dL — ABNORMAL HIGH (ref 6–20)
CO2: 22 mmol/L (ref 22–32)
Calcium: 9 mg/dL (ref 8.9–10.3)
Chloride: 98 mmol/L (ref 98–111)
Creatinine, Ser: 0.66 mg/dL (ref 0.44–1.00)
GFR, Estimated: >60 mL/min (ref >60)
Glucose, Bld: 155 mg/dL — ABNORMAL HIGH (ref 70–99)
Potassium: 3.6 mmol/L (ref 3.5–5.1)
Sodium: 130 mmol/L — ABNORMAL LOW (ref 135–145)
Total Bilirubin: 0.5 mg/dL (ref 0.3–1.2)
Total Protein: 6.5 g/dL (ref 6.5–8.1)

## 2020-07-31 LAB — VITAMIN D 25 HYDROXY (VIT D DEFICIENCY, FRACTURES): Vit D, 25-Hydroxy: 34.14 ng/mL (ref 30–100)

## 2020-07-31 MED ORDER — PROCHLORPERAZINE MALEATE 10 MG PO TABS
10.0000 mg | ORAL_TABLET | Freq: Once | ORAL | Status: AC
  Administered 2020-07-31: 10 mg via ORAL
  Filled 2020-07-31: qty 1

## 2020-07-31 MED ORDER — HEPARIN SOD (PORK) LOCK FLUSH 100 UNIT/ML IV SOLN
500.0000 [IU] | Freq: Once | INTRAVENOUS | Status: AC | PRN
  Administered 2020-07-31: 500 [IU]

## 2020-07-31 MED ORDER — SODIUM CHLORIDE 0.9 % IV SOLN
720.0000 mg/m2 | Freq: Once | INTRAVENOUS | Status: AC
  Administered 2020-07-31: 1482 mg via INTRAVENOUS
  Filled 2020-07-31: qty 12.68

## 2020-07-31 MED ORDER — SODIUM CHLORIDE 0.9 % IV SOLN: Freq: Once | INTRAVENOUS | Status: AC

## 2020-07-31 NOTE — Progress Notes (Signed)
Patients port flushed without difficulty.  Good blood return noted with no bruising or swelling noted at site.  Transparent dressing applied.  Patient remains accessed for chemotherapy treatment.  

## 2020-07-31 NOTE — Progress Notes (Signed)
Infusion tolerated without incident or complaint. VSS upon completion of treatment. Port flushed and deaccessed per protocol, see MAR and IV flowsheet for details. Discharged ambulatory in satisfactory condition with follow up instructions. 

## 2020-08-01 LAB — CA 125: Cancer Antigen (CA) 125: 285 U/mL — ABNORMAL HIGH (ref 0.0–38.1)

## 2020-08-05 ENCOUNTER — Other Ambulatory Visit (HOSPITAL_COMMUNITY): Payer: Self-pay

## 2020-08-05 MED ORDER — MORPHINE SULFATE ER 30 MG PO TBCR: 30.0000 mg | EXTENDED_RELEASE_TABLET | Freq: Three times a day (TID) | ORAL | 0 refills | Status: DC

## 2020-08-05 MED ORDER — FUROSEMIDE 20 MG PO TABS: 20.0000 mg | ORAL_TABLET | Freq: Every day | ORAL | 0 refills | Status: DC | PRN

## 2020-08-05 MED ORDER — ONDANSETRON HCL 8 MG PO TABS: 8.0000 mg | ORAL_TABLET | Freq: Three times a day (TID) | ORAL | 0 refills | Status: DC | PRN

## 2020-08-07 ENCOUNTER — Telehealth (HOSPITAL_COMMUNITY): Payer: Self-pay

## 2020-08-07 NOTE — Telephone Encounter (Signed)
Patient called concerning legs swelling for past 4-5 days. She states that is is more in one leg and foot than the other. Also her morphine is no longer covered by insurance.   Patient informed that Dr. Raliegh Ip is not here today and that she should go to the urgent care since the lasix is not helping. She states understanding and is going to see if she can get someone to take her. Informed her that we will be working with her insurance on coverage for her morphine.

## 2020-08-10 ENCOUNTER — Other Ambulatory Visit (HOSPITAL_COMMUNITY): Payer: Self-pay

## 2020-08-10 MED ORDER — HYDROMORPHONE HCL 4 MG PO TABS: 4.0000 mg | ORAL_TABLET | ORAL | 0 refills | Status: DC | PRN

## 2020-08-17 ENCOUNTER — Inpatient Hospital Stay (HOSPITAL_COMMUNITY): Payer: Medicare Other

## 2020-08-17 ENCOUNTER — Other Ambulatory Visit: Payer: Self-pay

## 2020-08-17 ENCOUNTER — Other Ambulatory Visit (HOSPITAL_COMMUNITY): Payer: Self-pay

## 2020-08-17 ENCOUNTER — Inpatient Hospital Stay (HOSPITAL_BASED_OUTPATIENT_CLINIC_OR_DEPARTMENT_OTHER): Payer: Medicare Other | Admitting: Hematology

## 2020-08-17 ENCOUNTER — Encounter (HOSPITAL_COMMUNITY): Payer: Self-pay | Admitting: Hematology

## 2020-08-17 VITALS — BP 129/90 | HR 100 | Temp 97.0°F | Resp 17

## 2020-08-17 VITALS — BP 137/77 | HR 100 | Temp 97.0°F | Resp 18 | Wt 206.8 lb

## 2020-08-17 DIAGNOSIS — Z5111 Encounter for antineoplastic chemotherapy: Secondary | ICD-10-CM | POA: Diagnosis not present

## 2020-08-17 DIAGNOSIS — C569 Malignant neoplasm of unspecified ovary: Secondary | ICD-10-CM

## 2020-08-17 DIAGNOSIS — C786 Secondary malignant neoplasm of retroperitoneum and peritoneum: Secondary | ICD-10-CM | POA: Diagnosis not present

## 2020-08-17 DIAGNOSIS — Z7189 Other specified counseling: Secondary | ICD-10-CM | POA: Diagnosis not present

## 2020-08-17 LAB — COMPREHENSIVE METABOLIC PANEL
ALT: 39 U/L (ref 0–44)
AST: 32 U/L (ref 15–41)
Albumin: 3.6 g/dL (ref 3.5–5.0)
Alkaline Phosphatase: 86 U/L (ref 38–126)
Anion gap: 10 (ref 5–15)
BUN: 14 mg/dL (ref 6–20)
CO2: 25 mmol/L (ref 22–32)
Calcium: 8.9 mg/dL (ref 8.9–10.3)
Chloride: 103 mmol/L (ref 98–111)
Creatinine, Ser: 0.65 mg/dL (ref 0.44–1.00)
GFR, Estimated: >60 mL/min (ref >60)
Glucose, Bld: 111 mg/dL — ABNORMAL HIGH (ref 70–99)
Potassium: 3.7 mmol/L (ref 3.5–5.1)
Sodium: 138 mmol/L (ref 135–145)
Total Bilirubin: 0.5 mg/dL (ref 0.3–1.2)
Total Protein: 6.5 g/dL (ref 6.5–8.1)

## 2020-08-17 LAB — CBC WITH DIFFERENTIAL/PLATELET
Abs Immature Granulocytes: 0.08 10*3/uL — ABNORMAL HIGH (ref 0.00–0.07)
Basophils Absolute: 0.1 10*3/uL (ref 0.0–0.1)
Basophils Relative: 2 %
Eosinophils Absolute: 0.2 10*3/uL (ref 0.0–0.5)
Eosinophils Relative: 3 %
HCT: 37.5 % (ref 36.0–46.0)
Hemoglobin: 11.8 g/dL — ABNORMAL LOW (ref 12.0–15.0)
Immature Granulocytes: 1 %
Lymphocytes Relative: 25 %
Lymphs Abs: 1.8 10*3/uL (ref 0.7–4.0)
MCH: 30.8 pg (ref 26.0–34.0)
MCHC: 31.5 g/dL (ref 30.0–36.0)
MCV: 97.9 fL (ref 80.0–100.0)
Monocytes Absolute: 0.8 10*3/uL (ref 0.1–1.0)
Monocytes Relative: 11 %
Neutro Abs: 4 10*3/uL (ref 1.7–7.7)
Neutrophils Relative %: 58 %
Platelets: 349 10*3/uL (ref 150–400)
RBC: 3.83 MIL/uL — ABNORMAL LOW (ref 3.87–5.11)
RDW: 18.6 % — ABNORMAL HIGH (ref 11.5–15.5)
WBC: 6.9 10*3/uL (ref 4.0–10.5)
nRBC: 0 % (ref 0.0–0.2)

## 2020-08-17 MED ORDER — FAMOTIDINE IN NACL 20-0.9 MG/50ML-% IV SOLN
20.0000 mg | Freq: Once | INTRAVENOUS | Status: AC
  Administered 2020-08-17: 20 mg via INTRAVENOUS

## 2020-08-17 MED ORDER — ALTEPLASE 2 MG IJ SOLR
INTRAMUSCULAR | Status: AC
  Filled 2020-08-17: qty 2

## 2020-08-17 MED ORDER — ALTEPLASE 2 MG IJ SOLR
2.0000 mg | Freq: Once | INTRAMUSCULAR | Status: AC
  Administered 2020-08-17: 2 mg

## 2020-08-17 MED ORDER — LORAZEPAM 2 MG/ML IJ SOLN
1.0000 mg | Freq: Once | INTRAMUSCULAR | Status: AC
  Administered 2020-08-17: 1 mg via INTRAVENOUS

## 2020-08-17 MED ORDER — LIDOCAINE-PRILOCAINE 2.5-2.5 % EX CREA: 1.0000 "application " | TOPICAL_CREAM | CUTANEOUS | 3 refills | Status: AC | PRN

## 2020-08-17 MED ORDER — SODIUM CHLORIDE 0.9 % IV SOLN
640.0000 mg/m2 | Freq: Once | INTRAVENOUS | Status: AC
  Administered 2020-08-17: 1330 mg via INTRAVENOUS
  Filled 2020-08-17: qty 26.3

## 2020-08-17 MED ORDER — PALONOSETRON HCL INJECTION 0.25 MG/5ML
0.2500 mg | Freq: Once | INTRAVENOUS | Status: AC
  Administered 2020-08-17: 0.25 mg via INTRAVENOUS
  Filled 2020-08-17: qty 5

## 2020-08-17 MED ORDER — FAMOTIDINE IN NACL 20-0.9 MG/50ML-% IV SOLN
INTRAVENOUS | Status: AC
  Filled 2020-08-17: qty 50

## 2020-08-17 MED ORDER — HEPARIN SOD (PORK) LOCK FLUSH 100 UNIT/ML IV SOLN
500.0000 [IU] | Freq: Once | INTRAVENOUS | Status: AC | PRN
  Administered 2020-08-17: 500 [IU]

## 2020-08-17 MED ORDER — DIPHENHYDRAMINE HCL 50 MG/ML IJ SOLN
INTRAMUSCULAR | Status: AC
  Filled 2020-08-17: qty 1

## 2020-08-17 MED ORDER — FUROSEMIDE 20 MG PO TABS: 40.0000 mg | ORAL_TABLET | Freq: Every day | ORAL | 1 refills | Status: DC | PRN

## 2020-08-17 MED ORDER — DIPHENHYDRAMINE HCL 50 MG/ML IJ SOLN
50.0000 mg | Freq: Once | INTRAMUSCULAR | Status: AC
  Administered 2020-08-17: 50 mg via INTRAVENOUS

## 2020-08-17 MED ORDER — CARBOPLATIN CHEMO INJECTION 600 MG/60ML
570.0000 mg | Freq: Once | INTRAVENOUS | Status: AC
Start: 2020-08-17 — End: 2020-08-17
  Administered 2020-08-17: 570 mg via INTRAVENOUS
  Filled 2020-08-17: qty 57

## 2020-08-17 MED ORDER — SODIUM CHLORIDE 0.9 % IV SOLN: Freq: Once | INTRAVENOUS | Status: DC

## 2020-08-17 MED ORDER — SODIUM CHLORIDE 0.9 % IV SOLN: Freq: Once | INTRAVENOUS | Status: AC

## 2020-08-17 MED ORDER — LORAZEPAM 2 MG/ML IJ SOLN
INTRAMUSCULAR | Status: AC
  Filled 2020-08-17: qty 1

## 2020-08-17 MED ORDER — SODIUM CHLORIDE 0.9% FLUSH
10.0000 mL | INTRAVENOUS | Status: DC | PRN
  Administered 2020-08-17: 10 mL

## 2020-08-17 MED ORDER — PROCHLORPERAZINE MALEATE 10 MG PO TABS: 10.0000 mg | ORAL_TABLET | Freq: Four times a day (QID) | ORAL | 1 refills | Status: DC | PRN

## 2020-08-17 MED ORDER — SODIUM CHLORIDE 0.9 % IV SOLN
150.0000 mg | Freq: Once | INTRAVENOUS | Status: AC
  Administered 2020-08-17: 150 mg via INTRAVENOUS
  Filled 2020-08-17: qty 150

## 2020-08-17 MED ORDER — STERILE WATER FOR INJECTION IJ SOLN
INTRAMUSCULAR | Status: AC
  Filled 2020-08-17: qty 10

## 2020-08-17 MED ORDER — SODIUM CHLORIDE 0.9 % IV SOLN
10.0000 mg | Freq: Once | INTRAVENOUS | Status: AC
  Administered 2020-08-17: 10 mg via INTRAVENOUS
  Filled 2020-08-17: qty 10

## 2020-08-17 NOTE — Patient Instructions (Signed)
[place] at [facility] Discharge Instructions  You were seen today by Dr. Delton Coombes. He went over your recent results. You received your treatment today. Start taking Lasix 40 mg daily to help with your leg swelling. You will be scheduled to have a CT scan of your abdomen done before your next visit. Dr. Delton Coombes will see you back in 3 weeks for labs and follow up.   Thank you for choosing [place] at [facility] to provide your oncology and hematology care.  To afford each patient quality time with our provider, please arrive at least 15 minutes before your scheduled appointment time.   If you have a lab appointment with the [facility] please come in thru the Main Entrance and check in at the main information desk  You need to re-schedule your appointment should you arrive 10 or more minutes late.  We strive to give you quality time with our providers, and arriving late affects you and other patients whose appointments are after yours.  Also, if you no show three or more times for appointments you may be dismissed from the clinic at the providers discretion.     Again, thank you for choosing [facility].  Our hope is that these requests will decrease the amount of time that you wait before being seen by our physicians.       _____________________________________________________________  Should you have questions after your visit to [facility], please contact our office at [phone] between the hours of 8:00 a.m. and 4:30 p.m.  Voicemails left after 4:00 p.m. will not be returned until the following business day.  For prescription refill requests, have your pharmacy contact our office and allow 72 hours.    Cancer Center Support Programs:   > Cancer Support Group  2nd Tuesday of the month 1pm-2pm, Journey Room

## 2020-08-17 NOTE — Patient Instructions (Signed)
[facility] Discharge Instructions for Patients Receiving Chemotherapy  Today you received the following chemotherapy agents   To help prevent nausea and vomiting after your treatment, we encourage you to take your nausea medication   If you develop nausea and vomiting that is not controlled by your nausea medication, call the clinic.   BELOW ARE SYMPTOMS THAT SHOULD BE REPORTED IMMEDIATELY:  *FEVER GREATER THAN 100.5 F  *CHILLS WITH OR WITHOUT FEVER  NAUSEA AND VOMITING THAT IS NOT CONTROLLED WITH YOUR NAUSEA MEDICATION  *UNUSUAL SHORTNESS OF BREATH  *UNUSUAL BRUISING OR BLEEDING  TENDERNESS IN MOUTH AND THROAT WITH OR WITHOUT PRESENCE OF ULCERS  *URINARY PROBLEMS  *BOWEL PROBLEMS  UNUSUAL RASH Items with * indicate a potential emergency and should be followed up as soon as possible.  Feel free to call the clinic should you have any questions or concerns. The clinic phone number is [phone].  Please show the CHEMO ALERT CARD at check-in to the Emergency Department and triage nurse.   

## 2020-08-17 NOTE — Progress Notes (Signed)
Patient presents today for Gemzar and Carboplatin infusions.  Vital signs within parameters for treatment.  Patient is having swelling in the bilateral feet that has not improved with Lasix.  She complaints of severe numbness, tingling and pain in the bilateral feet.  Worse in the left which is completely numb and patient can not put weight on it.  Recent US on the bilateral legs was negative for clots per patient.  Patient stated that she thinks its the chemo.  Told the patient to express these concerns to Dr. Delton Coombes during the visit.  Patients PORT is not giving blood and Alteplase was administered by another RN.  Labs within parameters for treatment.  PORT now giving blood return.  1330 Patient states that she feels nauseated and hot on the inside.  Patients face is flushed and her blood pressure is elevated.  Carboplatin stopped and Dr. Delton Coombes notified.  Ativan ordered and given.  Patients is no longer feeling nauseated but is still hot inside and flushed.  Dr. Delton Coombes notified and benadryl 50 mg IV and Pepcid 20 mg IV ordered and given.  Patient immediately felt better.  Per Dr. Delton Coombes okay to restart Carboplatin and is adding Pepcid and Benadryl to future treatments.  Patient finished Carboplatin without further incident.  Vital signs stable.  No complaints at this time.  Discharge from clinic Via wheelchair in stable condition.  Alert and oriented X 3.  Follow up with [facility] as scheduled.

## 2020-08-17 NOTE — Progress Notes (Signed)
Patient was assessed by Dr. Delton Coombes and labs have been reviewed.  Patient is okay to proceed with treatment today pending lab results, Dr. Raliegh Ip cut dose back 10%. Primary RN and pharmacy aware.

## 2020-08-17 NOTE — Progress Notes (Signed)
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  Numerous left predominant calcified pleural metastases and associated small left pleural effusion are unchanged.  2.  Unchanged (3.0 cm) (series 3, image 96) left breast mass    02/07/2019 Imaging   Outside Ct abdomen and pelvis 1. Overall stable small volume peritoneal tumor, as described above.  2. Unchanged infrarenal abdominal aortic aneurysm.    02/22/2019 Tumor Marker   Patient's tumor was tested for the following markers: CA-125 Results of the tumor marker test revealed 164   03/18/2019 Tumor Marker   Patient's tumor was tested for the following markers: CA-125 Results of the tumor marker test revealed 162   04/08/2019 Tumor Marker    Patient's tumor was tested for the following markers: CA-125 Results of the tumor marker test revealed 164   04/29/2019 Tumor Marker   Patient's tumor was tested for the following markers: CA-125 Results of the tumor marker test revealed 181   05/20/2019 Tumor Marker   Patient's tumor was tested for the following markers: CA-125 Results of the tumor marker test revealed 164   06/10/2019 Tumor Marker   Patient's tumor was tested for the following markers: CA-125 Results of the tumor marker test revealed 152   07/01/2019 Tumor Marker   Patient's tumor was tested for the following markers: CA-125 Results of the tumor marker test revealed 160   07/22/2019 Tumor Marker   Patient's tumor was tested for the following markers: CA-125 Results of the tumor marker test revealed 171   08/12/2019 Tumor Marker   Patient's tumor was tested for the following markers: CA-125 Results of the tumor marker test revealed 173   09/02/2019 Tumor Marker   Patient's tumor was tested for the following markers: CA-125 Results of the tumor marker test revealed 159   09/30/2019 Tumor Marker   Patient's tumor was tested for the following markers: CA-125 Results of the tumor marker test revealed 168   11/08/2019 Imaging   Outside CT imaging 1. Study was performed at outside institution on 11/08/2019 and presented for review on 11/14/2019.  2. Stable calcified and noncalcified left-sided nodular and plaque-like pleural abnormalities with small chronic left pleural effusion compatible with stable treated pleural metastasis. No new or enlarging lesions.  3. No enlarging thoracic lymph nodes.  4. Redemonstration of left breast mass. Correlation with mammography is  suggested if not already performed   12/02/2019 Tumor Marker   Patient's tumor was tested for the following markers: CA-125 Results of the tumor marker test revealed 186   12/18/2019 Cancer Staging   Staging form: Ovary, Fallopian Tube, and Primary  Peritoneal Carcinoma, AJCC 8th Edition - Clinical stage from 12/18/2019: FIGO Stage IV (rcT3c, cN0, cM1) - Signed by [name], MD on 04/24/2020   01/09/2020 Tumor Marker   Patient's tumor was tested for the following markers: CA-125 Results of the tumor marker test revealed 184   01/10/2020 Imaging   1. Treated left pleural and peritoneal metastatic disease. Areas of residual soft tissue prominence in the right anatomic pelvis are indeterminate. Comparison with prior exams would be helpful. 2. Hepatomegaly. Low-attenuation lesions in the liver are too small to characterize. Comparison with prior exams would be helpful. 3. Small left fibrothorax. 4. Infrarenal Aortic aneurysm NOS (ICD10-I71.9). Recommend followup by ultrasound in 2 years.  5. Aortic atherosclerosis (ICD10-I70.0). Coronary artery calcification.   03/09/2020 Tumor Marker   Patient's tumor was tested for the following markers: CA-125 Results of the tumor marker test revealed 214   04/01/2020 Tumor Marker   Patient's tumor was tested for the following markers: CA-125 Results of the tumor marker test revealed 245.   04/03/2020 Imaging   1. No acute findings in the abdomen or pelvis. Specifically, no findings to explain the patient's history of left upper quadrant pain with nausea and vomiting. 2. Stable appearance of calcified omental and mesenteric nodules consistent with treated metastatic disease. Soft tissue fullness seen in the right adnexal region on the prior exam is similar today. 3. Stable 1.6 x 1.2 cm low-density lesion lateral segment left liver. 4. 3.5 cm infrarenal abdominal aortic aneurysm. Recommend follow-up ultrasound every 2 years. This recommendation follows ACR consensus guidelines: White Paper of the ACR Incidental Findings Committee II on Vascular Findings. J Am Coll Radiol 2013; 07:371-062. 5. Aortic Atherosclerosis (ICD10-I70.0).   04/15/2020 Imaging   Focus of abnormal uptake is seen involving the  greater trochanter of the proximal left femur which corresponds to sclerotic density seen on prior CT scan and is concerning for metastatic disease.   04/23/2020 Imaging   1. No hip fracture, dislocation or avascular necrosis. 2. A 18 mm bone lesion in the peripheral superior aspect of the left greater trochanter with enhancement on postcontrast imaging. The overall appearance is most concerning for metastatic disease in the setting of known malignancy. 3. 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 4. Degenerative disease with disc height loss at L4-5 and L5-S1 with bilateral facet arthropathy.   06/08/2020 Tumor Marker   Patient's tumor was tested for the following markers: CA-125. Results of the tumor marker test revealed 272.   06/24/2020 Imaging   1. Overall progression of calcified and noncalcified peritoneal surface and omental lesions. 2. Slight progression of disease at the left lung base and involving the pleura. 3. Stable sclerotic bone lesion involving the left greater trochanter. 4. Stable infrarenal abdominal aortic aneurysm.   07/03/2020 -  Chemotherapy    Patient is on Treatment Plan: OVARIAN RECURRENT 3RD LINE CARBOPLATIN D1 / GEMCITABINE D1,8 (4/800) Q21D      07/03/2020 Tumor Marker   Patient's tumor was tested for the following markers: CA-125 Results of the tumor marker test revealed 285.   07/10/2020 Tumor Marker   Patient's tumor was tested for the following markers: CA-125 Results of the tumor marker test revealed 332   07/23/2020 Tumor Marker   Patient's  tumor was tested for the following markers: CA-125 Results of the tumor marker test revealed 308     CANCER STAGING: Cancer Staging Ovarian cancer [facility]) Staging form: Ovary, Fallopian Tube, and Primary Peritoneal Carcinoma, AJCC 8th Edition - Clinical stage from 12/18/2019: FIGO Stage IV (rcT3c, cN0, cM1) - Signed by [name], MD on 04/24/2020   INTERVAL HISTORY:  Ms. Heidi Johnson, a 50 y.o. female,  returns for routine follow-up and consideration for next cycle of chemotherapy. Kanae was last seen on 07/30/2020.  Due for cycle #3 of carboplatin, gemcitabine and Aloxi today.   Overall, she tells me she has been feeling poorly. She went to [facility] ED on 02/22 and had a doppler of left leg which was negative. She started taking Lasix 20 mg daily since 02/15. She complains of having pain in her left leg when she walks, but denies having pain at rest. Her appetite has decreased and she has not taken Marinol for a while. She is taking Zofran for nausea and Colace for her constipation. Her hair continues thinning.  She has received her handicap placard and social services will send her homecare to help her with her chores and ADL's.  Overall, she feels ready for next cycle of chemo today.    REVIEW OF SYSTEMS:  Review of Systems  Constitutional: Positive for appetite change (25%) and fatigue (10%).  Cardiovascular: Positive for leg swelling (L leg > R).  Gastrointestinal: Positive for constipation (on Colace) and nausea (on Zofran).  Musculoskeletal: Positive for gait problem (d/t L leg swelling & pain).  Neurological: Positive for gait problem (d/t L leg swelling & pain) and numbness.  All other systems reviewed and are negative.   PAST MEDICAL/SURGICAL HISTORY:  Past Medical History:  Diagnosis Date  . Cancer ([place])    ovarian cancer  . CHF (congestive heart failure) ([place])   . Hypertension   . Panic attacks   . Stroke [facility])    Past Surgical History:  Procedure Laterality Date  . ABDOMINAL HYSTERECTOMY    . BACK SURGERY    . TUBAL LIGATION      SOCIAL HISTORY:  Social History   Socioeconomic History  . Marital status: Divorced    Spouse name: Not on file  . Number of children: 3  . Years of education: Not on file  . Highest education level: Not on file  Occupational History  . Occupation: retired  Tobacco Use  . Smoking status: Former Smoker    Types: Cigarettes     Quit date: 06/20/2018    Years since quitting: 2.1  . Smokeless tobacco: Never Used  Substance and Sexual Activity  . Alcohol use: No  . Drug use: Yes    Types: Marijuana  . Sexual activity: Yes  Other Topics Concern  . Not on file  Social History Narrative   Lives alone   Social Determinants of Health   Financial Resource Strain: High Risk  . Difficulty of Paying Living Expenses: Hard  Food Insecurity: No Food Insecurity  . Worried About Charity fundraiser in the Last Year: Never true  . Ran Out of Food in the Last Year: Never true  Transportation Needs: No Transportation Needs  . Lack of Transportation (Medical): No  . Lack of Transportation (Non-Medical): No  Physical Activity: Inactive  . Days of Exercise per Week: 0 days  . Minutes of Exercise per Session: 0 min  Stress: No Stress Concern Present  . Feeling of Stress : Only a  little  Social Connections: Moderately Isolated  . Frequency of Communication with Friends and Family: Twice a week  . Frequency of Social Gatherings with Friends and Family: Twice a week  . Attends Religious Services: 1 to 4 times per year  . Active Member of Clubs or Organizations: No  . Attends Archivist Meetings: Never  . Marital Status: Divorced  Human resources officer Violence: Not At Risk  . Fear of Current or Ex-Partner: No  . Emotionally Abused: No  . Physically Abused: No  . Sexually Abused: No    FAMILY HISTORY:  Family History  Problem Relation Age of Onset  . Cancer Maternal Aunt        breast ca/GYN    CURRENT MEDICATIONS:  Current Outpatient Medications  Medication Sig Dispense Refill  . albuterol (VENTOLIN HFA) 108 (90 Base) MCG/ACT inhaler Inhale 2 puffs into the lungs as needed. 2 puffs into lungs every 6 hours as needed for wheezing or SOB    . aspirin 325 MG EC tablet Take 325 mg by mouth daily.    . clonazePAM (KLONOPIN) 1 MG tablet Take 1 mg by mouth 3 (three) times daily as needed for anxiety.    .  cyclobenzaprine (FLEXERIL) 5 MG tablet TAKE 1 TABLET (5 MG TOTAL) BY MOUTH 3 (THREE) TIMES DAILY AS NEEDED. 60 tablet 1  . docusate sodium (COLACE) 100 MG capsule Take 100 mg by mouth 2 (two) times daily.    . ergocalciferol (VITAMIN D2) 1.25 MG (50000 UT) capsule Take 1 capsule (50,000 Units total) by mouth once a week. 12 capsule 1  . furosemide (LASIX) 20 MG tablet Take 1 tablet (20 mg total) by mouth daily as needed for fluid or edema. 30 tablet 0  . gabapentin (NEURONTIN) 300 MG capsule Take 3 capsules (900 mg total) by mouth 3 (three) times daily. 270 capsule 4  . hydrochlorothiazide (HYDRODIURIL) 25 MG tablet Take 25 mg by mouth daily.    Marland Kitchen HYDROmorphone (DILAUDID) 4 MG tablet Take 1 tablet (4 mg total) by mouth every 4 (four) hours as needed for severe pain. 180 tablet 0  . ibuprofen (ADVIL) 600 MG tablet Take 600 mg by mouth every 6 (six) hours as needed.    . lidocaine-prilocaine (EMLA) cream Apply 1 application topically as needed (port).     Marland Kitchen lisinopril-hydrochlorothiazide (ZESTORETIC) 20-25 MG tablet Take 1 tablet by mouth 2 (two) times daily.    Marland Kitchen MARINOL 5 MG capsule Take 5 mg by mouth 3 (three) times daily.    Marland Kitchen morphine (MS CONTIN) 30 MG 12 hr tablet Take 1 tablet (30 mg total) by mouth every 8 (eight) hours. 90 tablet 0  . naloxone (NARCAN) 2 MG/2ML injection Place 2 mg into the nose as needed.     Marland Kitchen OLANZapine (ZYPREXA) 10 MG tablet Take 10 mg by mouth at bedtime.    . ondansetron (ZOFRAN) 8 MG tablet Take 1 tablet (8 mg total) by mouth every 8 (eight) hours as needed for nausea or vomiting. 60 tablet 0  . ondansetron (ZOFRAN-ODT) 8 MG disintegrating tablet Take 1 tablet (8 mg total) by mouth every 8 (eight) hours as needed for nausea or vomiting. 60 tablet 1  . polyethylene glycol (MIRALAX / GLYCOLAX) 17 g packet Take 17 g by mouth 2 (two) times daily. 100 each 11  . prochlorperazine (COMPAZINE) 10 MG tablet Take 1 tablet (10 mg total) by mouth every 6 (six) hours as needed  (Nausea or vomiting). 30 tablet 1  .  promethazine (PHENERGAN) 12.5 MG tablet Take 1 tablet (12.5 mg total) by mouth every 6 (six) hours as needed for nausea or vomiting. 30 tablet 2  . rosuvastatin (CRESTOR) 40 MG tablet Take 40 mg by mouth daily.    Marland Kitchen senna (SENOKOT) 8.6 MG tablet Take 2 tablets (17.2 mg total) by mouth 3 (three) times daily. 90 tablet 1   No current facility-administered medications for this visit.   Facility-Administered Medications Ordered in Other Visits  Medication Dose Route Frequency Provider Last Rate Last Admin  . heparin lock flush 100 unit/mL  500 Units Intracatheter Once [name], MD        ALLERGIES:  Allergies  Allergen Reactions  . Latex Rash and Hives  . Sulfamethoxazole-Trimethoprim Other (See Comments)    Reaction:  Unknown  Other reaction(s): Unknown When 40, dr told her not to take    PHYSICAL EXAM:  Performance status (ECOG): 1 - Symptomatic but completely ambulatory  Vitals:   08/17/20 0952  BP: 137/77  Pulse: 100  Resp: 18  Temp: (!) 97 F (36.1 C)  SpO2: 96%   Wt Readings from Last 3 Encounters:  08/17/20 206 lb 12.8 oz (93.8 kg)  07/31/20 190 lb 8 oz (86.4 kg)  07/30/20 191 lb (86.6 kg)   Physical Exam Vitals reviewed.  Constitutional:      Appearance: Normal appearance.  Cardiovascular:     Rate and Rhythm: Normal rate and regular rhythm.     Pulses: Normal pulses.     Heart sounds: Normal heart sounds.  Pulmonary:     Effort: Pulmonary effort is normal.     Breath sounds: Normal breath sounds.  Chest:     Comments: Port-a-Cath in L chest Musculoskeletal:     Right lower leg: Edema present.     Left lower leg: Edema present.  Neurological:     General: No focal deficit present.     Mental Status: She is alert and oriented to person, place, and time.  Psychiatric:        Mood and Affect: Mood normal.        Behavior: Behavior normal.     LABORATORY DATA:  I have reviewed the labs as listed.  CBC Latest Ref  Rng & Units 08/17/2020 07/31/2020 07/23/2020  WBC 4.0 - 10.5 K/uL 6.9 6.7 6.4  Hemoglobin 12.0 - 15.0 g/dL 11.8(L) 11.4(L) 11.9(L)  Hematocrit 36.0 - 46.0 % 37.5 35.4(L) 36.5  Platelets 150 - 400 K/uL 349 291 470(H)   CMP Latest Ref Rng & Units 08/17/2020 07/31/2020 07/23/2020  Glucose 70 - 99 mg/dL 111(H) 155(H) 142(H)  BUN 6 - 20 mg/dL 14 27(H) 5(L)  Creatinine 0.44 - 1.00 mg/dL 0.65 0.66 0.74  Sodium 135 - 145 mmol/L 138 130(L) 140  Potassium 3.5 - 5.1 mmol/L 3.7 3.6 3.8  Chloride 98 - 111 mmol/L 103 98 103  CO2 22 - 32 mmol/L 25 22 26   Calcium 8.9 - 10.3 mg/dL 8.9 9.0 9.4  Total Protein 6.5 - 8.1 g/dL 6.5 6.5 7.0  Total Bilirubin 0.3 - 1.2 mg/dL 0.5 0.5 0.3  Alkaline Phos 38 - 126 U/L 86 91 110  AST 15 - 41 U/L 32 41 60(H)  ALT 0 - 44 U/L 39 69(H) 96(H)   Lab Results  Component Value Date   VD25OH 34.14 07/31/2020   VD25OH 13.85 (L) 07/10/2020    DIAGNOSTIC IMAGING:  I have independently reviewed the scans and discussed with the patient. No results found.   ASSESSMENT:  1.  Stage IVa low-grade ovarian cancer: -05/05/2016-exploratory laparotomy, posterior exenteration, argon beam ablation of peritoneum, diaphragm stripping, cholecystectomy, omentectomy, appendectomy and optimal tumor debulking by Dr. Carrolyn Leigh at [facility]. -06/24/2016: Started Taxol/carboplatin.  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  Taxol discontinued cycle 5. -10/24/2016: Started maintenance letrozole. -March 2020 bevacizumab started and completed 22 cycles until 12/02/2019, discontinued due to TIA and uncontrolled hypertension. -Germline mutation testing reportedly done at [facility] was negative. -She is under the care of Dr. Alvy Bimler from July 2021 -CTAP on 04/03/2020 with stable appearance of calcified omental and mesenteric nodules.  Soft tissue fullness in the right adnexal region is stable. -MRI of the left hip on 04/23/2020 with 18 mm bone lesion in the peripheral superior aspect of the left greater trochanter -XRT  to left femur lesion, 30 Gray from 05/06/2020 through 05/20/2020. -CTAP on 06/24/2020 with overall progression of calcified and noncalcified peritoneal surface and omental lesions with slight progression of disease at the left lung base involving the pleura.  Stable sclerotic bone lesion involving the left greater trochanter. -Cycle 1 of carboplatin and gemcitabine started on 07/03/2020 -Cycle 2 of carboplatin and gemcitabine on 07/24/2020.  2.  Social/family history: -Lives by herself.  Worked as a Land.  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  Quit smoking 2 years ago.  3.  Abdominal aneurysm: -CTAP on 06/24/2020 shows stable 3.4 x 3.3 cm infrarenal abdominal aortic aneurysm. -Used to follow with vascular surgery at [facility].   PLAN:  1.  Stage IVa low-grade ovarian cancer: -She is here for cycle 3 of chemotherapy. -Reviewed labs from today which showed normal LFTs and white count. -CA-125 has decreased to 285 from 308. -She will proceed with cycle 3 today.  I will dose reduce gemcitabine by 20% because of fluid retention. -RTC 3 weeks for follow-up.  Plan to repeat CT CAP prior to next visit. -Last CT chest showed left breast mass.  Will recommend mammogram at next visit.  2.  Back pain/neuropathy: -Continue MS Contin 30 mg every 8 hours.  Continue Dilaudid 4 mg every 4 hours as needed for breakthrough pain. -Continue gabapentin 900 mg 3 times daily for neuropathy in the feet and hands.  3.  Hypertension: -Continue lisinopril/HCTZ daily.  4.  Nausea/vomiting: -Continue Compazine/Phenergan as needed.  Also use Zofran as needed.  5.  Lower extremity swelling: -Likely gemcitabine induced.  She was evaluated in [place] [facility] 6 days ago.  Doppler was negative for DVT. -Lasix 20 mg daily was started on 08/05/2020. -I will increase Lasix to 40 mg daily.   Orders placed this encounter:  No orders of the defined types were placed in this encounter.    Derek Jack, MD [place] [phone]   I, Milinda Antis, am acting as a scribe for Dr. Sanda Linger.  I, Derek Jack MD, have reviewed the above documentation for accuracy and completeness, and I agree with the above.

## 2020-08-18 ENCOUNTER — Other Ambulatory Visit (HOSPITAL_COMMUNITY): Payer: Self-pay

## 2020-08-18 DIAGNOSIS — C569 Malignant neoplasm of unspecified ovary: Secondary | ICD-10-CM

## 2020-08-18 DIAGNOSIS — C786 Secondary malignant neoplasm of retroperitoneum and peritoneum: Secondary | ICD-10-CM

## 2020-08-24 ENCOUNTER — Encounter (HOSPITAL_COMMUNITY): Payer: Self-pay

## 2020-08-24 ENCOUNTER — Inpatient Hospital Stay (HOSPITAL_COMMUNITY): Payer: Medicare Other

## 2020-08-24 ENCOUNTER — Other Ambulatory Visit: Payer: Self-pay

## 2020-08-24 ENCOUNTER — Inpatient Hospital Stay (HOSPITAL_COMMUNITY): Payer: Medicare Other | Attending: Hematology

## 2020-08-24 VITALS — BP 122/85 | HR 92 | Temp 97.5°F | Resp 18

## 2020-08-24 DIAGNOSIS — Z79899 Other long term (current) drug therapy: Secondary | ICD-10-CM | POA: Insufficient documentation

## 2020-08-24 DIAGNOSIS — R112 Nausea with vomiting, unspecified: Secondary | ICD-10-CM | POA: Diagnosis not present

## 2020-08-24 DIAGNOSIS — Z9221 Personal history of antineoplastic chemotherapy: Secondary | ICD-10-CM | POA: Insufficient documentation

## 2020-08-24 DIAGNOSIS — Z87891 Personal history of nicotine dependence: Secondary | ICD-10-CM | POA: Insufficient documentation

## 2020-08-24 DIAGNOSIS — Z5111 Encounter for antineoplastic chemotherapy: Secondary | ICD-10-CM | POA: Diagnosis present

## 2020-08-24 DIAGNOSIS — Z7189 Other specified counseling: Secondary | ICD-10-CM

## 2020-08-24 DIAGNOSIS — M7989 Other specified soft tissue disorders: Secondary | ICD-10-CM | POA: Insufficient documentation

## 2020-08-24 DIAGNOSIS — I700 Atherosclerosis of aorta: Secondary | ICD-10-CM | POA: Diagnosis not present

## 2020-08-24 DIAGNOSIS — Z86718 Personal history of other venous thrombosis and embolism: Secondary | ICD-10-CM | POA: Insufficient documentation

## 2020-08-24 DIAGNOSIS — G629 Polyneuropathy, unspecified: Secondary | ICD-10-CM | POA: Diagnosis not present

## 2020-08-24 DIAGNOSIS — I714 Abdominal aortic aneurysm, without rupture: Secondary | ICD-10-CM | POA: Insufficient documentation

## 2020-08-24 DIAGNOSIS — I509 Heart failure, unspecified: Secondary | ICD-10-CM | POA: Insufficient documentation

## 2020-08-24 DIAGNOSIS — Z8673 Personal history of transient ischemic attack (TIA), and cerebral infarction without residual deficits: Secondary | ICD-10-CM | POA: Diagnosis not present

## 2020-08-24 DIAGNOSIS — C569 Malignant neoplasm of unspecified ovary: Secondary | ICD-10-CM

## 2020-08-24 DIAGNOSIS — C786 Secondary malignant neoplasm of retroperitoneum and peritoneum: Secondary | ICD-10-CM

## 2020-08-24 DIAGNOSIS — I10 Essential (primary) hypertension: Secondary | ICD-10-CM | POA: Diagnosis not present

## 2020-08-24 LAB — CBC WITH DIFFERENTIAL/PLATELET
Abs Immature Granulocytes: 0.05 10*3/uL (ref 0.00–0.07)
Basophils Absolute: 0.1 10*3/uL (ref 0.0–0.1)
Basophils Relative: 2 %
Eosinophils Absolute: 0.1 10*3/uL (ref 0.0–0.5)
Eosinophils Relative: 1 %
HCT: 34.7 % — ABNORMAL LOW (ref 36.0–46.0)
Hemoglobin: 11.3 g/dL — ABNORMAL LOW (ref 12.0–15.0)
Immature Granulocytes: 1 %
Lymphocytes Relative: 27 %
Lymphs Abs: 1.6 10*3/uL (ref 0.7–4.0)
MCH: 31 pg (ref 26.0–34.0)
MCHC: 32.6 g/dL (ref 30.0–36.0)
MCV: 95.3 fL (ref 80.0–100.0)
Monocytes Absolute: 0.7 10*3/uL (ref 0.1–1.0)
Monocytes Relative: 13 %
Neutro Abs: 3.4 10*3/uL (ref 1.7–7.7)
Neutrophils Relative %: 56 %
Platelets: 250 10*3/uL (ref 150–400)
RBC: 3.64 MIL/uL — ABNORMAL LOW (ref 3.87–5.11)
RDW: 17 % — ABNORMAL HIGH (ref 11.5–15.5)
WBC: 5.9 10*3/uL (ref 4.0–10.5)
nRBC: 0 % (ref 0.0–0.2)

## 2020-08-24 LAB — COMPREHENSIVE METABOLIC PANEL WITH GFR
ALT: 44 U/L (ref 0–44)
AST: 33 U/L (ref 15–41)
Albumin: 3.8 g/dL (ref 3.5–5.0)
Alkaline Phosphatase: 98 U/L (ref 38–126)
Anion gap: 13 (ref 5–15)
BUN: 27 mg/dL — ABNORMAL HIGH (ref 6–20)
CO2: 23 mmol/L (ref 22–32)
Calcium: 8.8 mg/dL — ABNORMAL LOW (ref 8.9–10.3)
Chloride: 96 mmol/L — ABNORMAL LOW (ref 98–111)
Creatinine, Ser: 1.09 mg/dL — ABNORMAL HIGH (ref 0.44–1.00)
GFR, Estimated: >60 mL/min
Glucose, Bld: 146 mg/dL — ABNORMAL HIGH (ref 70–99)
Potassium: 3 mmol/L — ABNORMAL LOW (ref 3.5–5.1)
Sodium: 132 mmol/L — ABNORMAL LOW (ref 135–145)
Total Bilirubin: 0.5 mg/dL (ref 0.3–1.2)
Total Protein: 6.7 g/dL (ref 6.5–8.1)

## 2020-08-24 MED ORDER — PALONOSETRON HCL INJECTION 0.25 MG/5ML
0.2500 mg | Freq: Once | INTRAVENOUS | Status: AC
  Administered 2020-08-24: 0.25 mg via INTRAVENOUS

## 2020-08-24 MED ORDER — HEPARIN SOD (PORK) LOCK FLUSH 100 UNIT/ML IV SOLN
500.0000 [IU] | Freq: Once | INTRAVENOUS | Status: AC | PRN
  Administered 2020-08-24: 500 [IU]

## 2020-08-24 MED ORDER — SODIUM CHLORIDE 0.9 % IV SOLN: Freq: Once | INTRAVENOUS | Status: AC

## 2020-08-24 MED ORDER — POTASSIUM CHLORIDE IN NACL 20-0.9 MEQ/L-% IV SOLN
Freq: Once | INTRAVENOUS | Status: AC
  Filled 2020-08-24: qty 1000

## 2020-08-24 MED ORDER — PALONOSETRON HCL INJECTION 0.25 MG/5ML
INTRAVENOUS | Status: AC
  Filled 2020-08-24: qty 5

## 2020-08-24 MED ORDER — SODIUM CHLORIDE 0.9% FLUSH
10.0000 mL | INTRAVENOUS | Status: DC | PRN
  Administered 2020-08-24: 10 mL

## 2020-08-24 MED ORDER — MAGNESIUM SULFATE 2 GM/50ML IV SOLN
2.0000 g | Freq: Once | INTRAVENOUS | Status: AC
  Administered 2020-08-24: 2 g via INTRAVENOUS

## 2020-08-24 MED ORDER — MAGNESIUM SULFATE 2 GM/50ML IV SOLN
INTRAVENOUS | Status: AC
  Filled 2020-08-24: qty 50

## 2020-08-24 MED ORDER — SODIUM CHLORIDE 0.9 % IV SOLN
640.0000 mg/m2 | Freq: Once | INTRAVENOUS | Status: AC
  Administered 2020-08-24: 1330 mg via INTRAVENOUS
  Filled 2020-08-24: qty 26.3

## 2020-08-24 NOTE — Progress Notes (Signed)
Patient presents today for treatment. Labs pending. Vital signs within parameters for today's treatment. MAR reviewed and updated. Labs pending. Patient denies pain today. Patient states, " The swelling in my ankles is better." Per patient Dr. Delton Coombes aware. Edema noted bilateral in ankles 1+. Patient denies any increase in severity and states, " that swelling is much better."   Message received from Dr. Delton Coombes to proceed with today's treatment plan. Compazine changed to Aloxi today per Dr. Delton Coombes. Infuse house fluids over two hours today with treatment.   Treatment given today per MD orders. Tolerated infusion without adverse affects. Vital signs stable. No complaints at this time. Discharged from clinic via wheel chair in stable condition. Alert and oriented x 3. F/U with [facility] as scheduled.

## 2020-08-25 LAB — CA 125: Cancer Antigen (CA) 125: 235 U/mL — ABNORMAL HIGH (ref 0.0–38.1)

## 2020-08-27 ENCOUNTER — Other Ambulatory Visit (HOSPITAL_COMMUNITY): Payer: Self-pay | Admitting: Hematology

## 2020-09-02 ENCOUNTER — Ambulatory Visit (HOSPITAL_COMMUNITY)
Admission: RE | Admit: 2020-09-02 | Discharge: 2020-09-02 | Disposition: A | Discharge status: Home / Self Care | Payer: Medicare Other | Source: Ambulatory Visit | Attending: Hematology | Admitting: Hematology

## 2020-09-02 ENCOUNTER — Encounter (HOSPITAL_COMMUNITY): Payer: Self-pay | Admitting: Radiology

## 2020-09-02 DIAGNOSIS — C786 Secondary malignant neoplasm of retroperitoneum and peritoneum: Secondary | ICD-10-CM | POA: Diagnosis present

## 2020-09-02 DIAGNOSIS — C569 Malignant neoplasm of unspecified ovary: Secondary | ICD-10-CM | POA: Diagnosis present

## 2020-09-02 MED ORDER — IOHEXOL 300 MG/ML  SOLN
100.0000 mL | Freq: Once | INTRAMUSCULAR | Status: AC | PRN
  Administered 2020-09-02: 100 mL via INTRAVENOUS

## 2020-09-03 ENCOUNTER — Other Ambulatory Visit (HOSPITAL_COMMUNITY): Payer: Self-pay

## 2020-09-03 DIAGNOSIS — C786 Secondary malignant neoplasm of retroperitoneum and peritoneum: Secondary | ICD-10-CM

## 2020-09-03 DIAGNOSIS — C569 Malignant neoplasm of unspecified ovary: Secondary | ICD-10-CM

## 2020-09-03 MED ORDER — MORPHINE SULFATE ER 30 MG PO TBCR: 30.0000 mg | EXTENDED_RELEASE_TABLET | Freq: Three times a day (TID) | ORAL | 0 refills | Status: DC

## 2020-09-07 ENCOUNTER — Inpatient Hospital Stay (HOSPITAL_BASED_OUTPATIENT_CLINIC_OR_DEPARTMENT_OTHER): Payer: Medicare Other | Admitting: Hematology

## 2020-09-07 ENCOUNTER — Other Ambulatory Visit: Payer: Self-pay

## 2020-09-07 ENCOUNTER — Inpatient Hospital Stay (HOSPITAL_COMMUNITY): Payer: Medicare Other

## 2020-09-07 VITALS — BP 127/88 | HR 106 | Temp 96.8°F | Resp 18 | Wt 204.5 lb

## 2020-09-07 DIAGNOSIS — Z5111 Encounter for antineoplastic chemotherapy: Secondary | ICD-10-CM | POA: Diagnosis not present

## 2020-09-07 DIAGNOSIS — C786 Secondary malignant neoplasm of retroperitoneum and peritoneum: Secondary | ICD-10-CM | POA: Diagnosis not present

## 2020-09-07 DIAGNOSIS — C569 Malignant neoplasm of unspecified ovary: Secondary | ICD-10-CM

## 2020-09-07 LAB — CBC WITH DIFFERENTIAL/PLATELET
Abs Immature Granulocytes: 0.05 10*3/uL (ref 0.00–0.07)
Basophils Absolute: 0.1 10*3/uL (ref 0.0–0.1)
Basophils Relative: 1 %
Eosinophils Absolute: 0.2 10*3/uL (ref 0.0–0.5)
Eosinophils Relative: 3 %
HCT: 33.7 % — ABNORMAL LOW (ref 36.0–46.0)
Hemoglobin: 10.6 g/dL — ABNORMAL LOW (ref 12.0–15.0)
Immature Granulocytes: 1 %
Lymphocytes Relative: 17 %
Lymphs Abs: 1.3 10*3/uL (ref 0.7–4.0)
MCH: 31.3 pg (ref 26.0–34.0)
MCHC: 31.5 g/dL (ref 30.0–36.0)
MCV: 99.4 fL (ref 80.0–100.0)
Monocytes Absolute: 1 10*3/uL (ref 0.1–1.0)
Monocytes Relative: 14 %
Neutro Abs: 4.8 10*3/uL (ref 1.7–7.7)
Neutrophils Relative %: 64 %
Platelets: 314 10*3/uL (ref 150–400)
RBC: 3.39 MIL/uL — ABNORMAL LOW (ref 3.87–5.11)
RDW: 18.9 % — ABNORMAL HIGH (ref 11.5–15.5)
WBC: 7.5 10*3/uL (ref 4.0–10.5)
nRBC: 0 % (ref 0.0–0.2)

## 2020-09-07 LAB — COMPREHENSIVE METABOLIC PANEL
ALT: 35 U/L (ref 0–44)
AST: 38 U/L (ref 15–41)
Albumin: 3.4 g/dL — ABNORMAL LOW (ref 3.5–5.0)
Alkaline Phosphatase: 86 U/L (ref 38–126)
Anion gap: 10 (ref 5–15)
BUN: 12 mg/dL (ref 6–20)
CO2: 27 mmol/L (ref 22–32)
Calcium: 9 mg/dL (ref 8.9–10.3)
Chloride: 100 mmol/L (ref 98–111)
Creatinine, Ser: 0.79 mg/dL (ref 0.44–1.00)
GFR, Estimated: >60 mL/min (ref >60)
Glucose, Bld: 147 mg/dL — ABNORMAL HIGH (ref 70–99)
Potassium: 3.4 mmol/L — ABNORMAL LOW (ref 3.5–5.1)
Sodium: 137 mmol/L (ref 135–145)
Total Bilirubin: 0.3 mg/dL (ref 0.3–1.2)
Total Protein: 6.7 g/dL (ref 6.5–8.1)

## 2020-09-07 MED ORDER — MORPHINE SULFATE ER 30 MG PO TBCR: 30.0000 mg | EXTENDED_RELEASE_TABLET | Freq: Three times a day (TID) | ORAL | 0 refills | Status: DC

## 2020-09-07 NOTE — Progress Notes (Signed)
Heidi Johnson presents today for D1C4 Carbo/Gemzar . Pt reports worsening swelling and neuropathy in her BLLE, worse in the left. She reports it as a pain constantly shooting through her legs. Lab results and vitals have been reviewed and are stable. Patient has been assessed by Dr. Delton Coombes who orders to hold treatment today because of worsening symptoms. Port flushed and deaccessed per protocol, see MAR and IV flowsheet for details. Discharged in satisfactory condition with follow up instructions.

## 2020-09-07 NOTE — Patient Instructions (Signed)
[place] at [facility] Discharge Instructions  You were seen today by Dr. Delton Coombes. He went over your recent results and scans. You did not receive your treatment today due to your left leg pain. Drink 1-2 liters of water daily to keep your kidneys flushed. Dr. Delton Coombes will see you back in 2 weeks for labs and follow up.   Thank you for choosing [place] at [facility] to provide your oncology and hematology care.  To afford each patient quality time with our provider, please arrive at least 15 minutes before your scheduled appointment time.   If you have a lab appointment with the [facility] please come in thru the Main Entrance and check in at the main information desk  You need to re-schedule your appointment should you arrive 10 or more minutes late.  We strive to give you quality time with our providers, and arriving late affects you and other patients whose appointments are after yours.  Also, if you no show three or more times for appointments you may be dismissed from the clinic at the providers discretion.     Again, thank you for choosing [facility].  Our hope is that these requests will decrease the amount of time that you wait before being seen by our physicians.       _____________________________________________________________  Should you have questions after your visit to [facility], please contact our office at [phone] between the hours of 8:00 a.m. and 4:30 p.m.  Voicemails left after 4:00 p.m. will not be returned until the following business day.  For prescription refill requests, have your pharmacy contact our office and allow 72 hours.    Cancer Center Support Programs:   > Cancer Support Group  2nd Tuesday of the month 1pm-2pm, Journey Room

## 2020-09-07 NOTE — Progress Notes (Signed)
Patient was assessed by Dr. Katragadda and labs have been reviewed. No treatment today. Primary RN and pharmacy aware.   

## 2020-09-07 NOTE — Progress Notes (Signed)
Heidi Johnson, [place] [id number]   CLINIC:  Medical Oncology/Hematology  PCP:  Default, Provider, MD None None   REASON FOR VISIT:  Follow-up for ovarian cancer and peritoneal carcinomatosis  PRIOR THERAPY:  1. Exploratory laparotomy, posterior exenteration, argon beam ablation of peritoneum, omentectomy and optimal tumor debulking on 05/05/2016. 2. Carboplatin and paclitaxel x 5 cycles from 06/24/2016. 3. Avastin x 22 cycles through 12/02/2019 discontinued due to TIA and hypertension.  NGS Results: Not done  CURRENT THERAPY: Carboplatin, gemcitabine and Aloxi every 3 weeks  BRIEF ONCOLOGIC HISTORY:  Oncology History Overview Note  Negative genetics at [facility]   Ovarian cancer ([place])  04/25/2016 Imaging   CT confirms 12cm ovarian masses with extensive peritoneal metastases and malignant pleural effusions   05/05/2016 Surgery   s/p ex-lap posterior exenteration, diaphragm stripping, cholecystectomy, omentectomy, appendectomy and optimal tumor debulking on 05/05/16   06/24/2016 - 10/24/2016 Chemotherapy   Started Taxol/Carboplatin. 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  Numerous left predominant calcified pleural metastases and associated small left pleural effusion are unchanged.  2.  Unchanged (3.0 cm) (series 3, image 96) left breast mass    02/07/2019 Imaging   Outside Ct abdomen and pelvis 1. Overall stable small volume peritoneal tumor, as described above.  2. Unchanged infrarenal abdominal aortic aneurysm.    02/22/2019  Tumor Marker   Patient's tumor was tested for the following markers: CA-125 Results of the tumor marker test revealed 164   03/18/2019 Tumor Marker   Patient's tumor was tested for the following markers: CA-125 Results of the tumor marker test revealed 162   04/08/2019 Tumor Marker   Patient's tumor was tested for the following markers: CA-125 Results of the tumor marker test revealed 164   04/29/2019 Tumor Marker   Patient's tumor was tested for the following markers: CA-125 Results of the tumor marker test revealed 181   05/20/2019 Tumor Marker   Patient's tumor was tested for the following markers: CA-125 Results of the tumor marker test revealed 164   06/10/2019 Tumor Marker   Patient's tumor was tested for the following markers: CA-125 Results of the tumor marker test revealed 152   07/01/2019 Tumor Marker   Patient's tumor was tested for the following markers: CA-125 Results of the tumor marker test revealed 160   07/22/2019 Tumor Marker   Patient's tumor was tested for the following markers: CA-125 Results of the tumor marker test revealed 171   08/12/2019 Tumor Marker   Patient's tumor was tested for the following markers: CA-125 Results of the tumor marker test revealed 173   09/02/2019 Tumor Marker   Patient's tumor was tested for the following markers: CA-125 Results of the tumor marker test revealed 159   09/30/2019 Tumor Marker   Patient's tumor was tested for the following markers: CA-125 Results of the tumor marker test revealed 168   11/08/2019 Imaging   Outside CT imaging 1. Study was performed at outside institution on 11/08/2019 and presented for review on 11/14/2019.  2. Stable calcified and noncalcified left-sided  nodular and plaque-like pleural abnormalities with small chronic left pleural effusion compatible with stable treated pleural metastasis. No new or enlarging lesions.  3. No enlarging thoracic lymph nodes.  4. Redemonstration of left breast mass.  Correlation with mammography is suggested if not already performed   12/02/2019 Tumor Marker   Patient's tumor was tested for the following markers: CA-125 Results of the tumor marker test revealed 186   12/18/2019 Cancer Staging   Staging form: Ovary, Fallopian Tube, and Primary Peritoneal Carcinoma, AJCC 8th Edition - Clinical stage from 12/18/2019: FIGO Stage IV (rcT3c, cN0, cM1) - Signed by [name], MD on 04/24/2020   01/09/2020 Tumor Marker   Patient's tumor was tested for the following markers: CA-125 Results of the tumor marker test revealed 184   01/10/2020 Imaging   1. Treated left pleural and peritoneal metastatic disease. Areas of residual soft tissue prominence in the right anatomic pelvis are indeterminate. Comparison with prior exams would be helpful. 2. Hepatomegaly. Low-attenuation lesions in the liver are too small to characterize. Comparison with prior exams would be helpful. 3. Small left fibrothorax. 4. Infrarenal Aortic aneurysm NOS (ICD10-I71.9). Recommend followup by ultrasound in 2 years.  5. Aortic atherosclerosis (ICD10-I70.0). Coronary artery calcification.   03/09/2020 Tumor Marker   Patient's tumor was tested for the following markers: CA-125 Results of the tumor marker test revealed 214   04/01/2020 Tumor Marker   Patient's tumor was tested for the following markers: CA-125 Results of the tumor marker test revealed 245.   04/03/2020 Imaging   1. No acute findings in the abdomen or pelvis. Specifically, no findings to explain the patient's history of left upper quadrant pain with nausea and vomiting. 2. Stable appearance of calcified omental and mesenteric nodules consistent with treated metastatic disease. Soft tissue fullness seen in the right adnexal region on the prior exam is similar today. 3. Stable 1.6 x 1.2 cm low-density lesion lateral segment left liver. 4. 3.5 cm infrarenal abdominal aortic aneurysm. Recommend follow-up ultrasound every 2 years.  This recommendation follows ACR consensus guidelines: White Paper of the ACR Incidental Findings Committee II on Vascular Findings. J Am Coll Radiol 2013; 99:833-825. 5. Aortic Atherosclerosis (ICD10-I70.0).   04/15/2020 Imaging   Focus of abnormal uptake is seen involving the greater trochanter of the proximal left femur which corresponds to sclerotic density seen on prior CT scan and is concerning for metastatic disease.   04/23/2020 Imaging   1. No hip fracture, dislocation or avascular necrosis. 2. A 18 mm bone lesion in the peripheral superior aspect of the left greater trochanter with enhancement on postcontrast imaging. The overall appearance is most concerning for metastatic disease in the setting of known malignancy. 3. 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 4. Degenerative disease with disc height loss at L4-5 and L5-S1 with bilateral facet arthropathy.   06/08/2020 Tumor Marker   Patient's tumor was tested for the following markers: CA-125. Results of the tumor marker test revealed 272.   06/24/2020 Imaging   1. Overall progression of calcified and noncalcified peritoneal surface and omental lesions. 2. Slight progression of disease at the left lung base and involving the pleura. 3. Stable sclerotic bone lesion involving the left greater trochanter. 4. Stable infrarenal abdominal aortic aneurysm.   07/03/2020 -  Chemotherapy    Patient is on Treatment Plan: OVARIAN RECURRENT 3RD LINE CARBOPLATIN D1 / GEMCITABINE D1,8 (4/800) Q21D      07/03/2020 Tumor Marker   Patient's tumor was tested for the following markers: CA-125  Results of the tumor marker test revealed 285.   07/10/2020 Tumor Marker   Patient's tumor was tested for the following markers: CA-125 Results of the tumor marker test revealed 332   07/23/2020 Tumor Marker   Patient's tumor was tested for the following markers: CA-125 Results of the tumor marker test revealed 308     CANCER STAGING: Cancer  Staging Ovarian cancer [facility]) Staging form: Ovary, Fallopian Tube, and Primary Peritoneal Carcinoma, AJCC 8th Edition - Clinical stage from 12/18/2019: FIGO Stage IV (rcT3c, cN0, cM1) - Signed by [name], MD on 04/24/2020   INTERVAL HISTORY:  Ms. KAYLANA FENSTERMACHER, a 50 y.o. female, returns for routine follow-up and consideration for next cycle of chemotherapy. Heidi Johnson was last seen on 08/17/2020.  Due for cycle #4 of carboplatin, gemcitabine and Aloxi today.   Today she is accompanied by her neighbor. Overall, she tells me she has been feeling fair. She continues having left ankle swelling with erythema for a week after getting chemo despite taking Lasix 20 mg and she applies an ice pack which helps control the pain, but the pain then returns once she starts walking. She also complains of having excruciating shooting pain from her lateral left knee traveling down to her ankle when she keeps her leg upright. She denies having such pain prior to starting chemo. Her balance and gait are off since starting the chemo. She takes Zofran TID for nausea and denies having abdominal pain.  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   REVIEW OF SYSTEMS:  Review of Systems  Constitutional: Positive for appetite change (50%) and fatigue (depleted).  Respiratory: Positive for shortness of breath (at rest).   Cardiovascular: Positive for leg swelling (L ankle > R w/ walking).  Gastrointestinal: Positive for nausea (on Zofran). Negative for abdominal pain.  Musculoskeletal: Positive for arthralgias (L ankle).  Neurological: Positive for dizziness (off balance) and numbness (shooting pain from L lateral knee to ankle).  All other systems reviewed and are negative.   PAST MEDICAL/SURGICAL HISTORY:  Past Medical History:  Diagnosis Date  . Cancer ([place])    ovarian cancer  . CHF (congestive heart failure) ([place])   . Hypertension   . Panic attacks   . Stroke [facility])     Past Surgical History:  Procedure Laterality Date  . ABDOMINAL HYSTERECTOMY    . BACK SURGERY    . TUBAL LIGATION      SOCIAL HISTORY:  Social History   Socioeconomic History  . Marital status: Divorced    Spouse name: Not on file  . Number of children: 3  . Years of education: Not on file  . Highest education level: Not on file  Occupational History  . Occupation: retired  Tobacco Use  . Smoking status: Former Smoker    Types: Cigarettes    Quit date: 06/20/2018    Years since quitting: 2.2  . Smokeless tobacco: Never Used  Substance and Sexual Activity  . Alcohol use: No  . Drug use: Yes    Types: Marijuana  . Sexual activity: Yes  Other Topics Concern  . Not on file  Social History Narrative   Lives alone   Social Determinants of Health   Financial Resource Strain: High Risk  . Difficulty of Paying Living Expenses: Hard  Food Insecurity: No Food Insecurity  . Worried About Charity fundraiser in the Last Year: Never true  . Ran Out of Food in the Last  Year: Never true  Transportation Needs: No Transportation Needs  . Lack of Transportation (Medical): No  . Lack of Transportation (Non-Medical): No  Physical Activity: Inactive  . Days of Exercise per Week: 0 days  . Minutes of Exercise per Session: 0 min  Stress: No Stress Concern Present  . Feeling of Stress : Only a little  Social Connections: Moderately Isolated  . Frequency of Communication with Friends and Family: Twice a week  . Frequency of Social Gatherings with Friends and Family: Twice a week  . Attends Religious Services: 1 to 4 times per year  . Active Member of Clubs or Organizations: No  . Attends Archivist Meetings: Never  . Marital Status: Divorced  Human resources officer Violence: Not At Risk  . Fear of Current or Ex-Partner: No  . Emotionally Abused: No  . Physically Abused: No  . Sexually Abused: No    FAMILY HISTORY:  Family History  Problem Relation Age of Onset  . Cancer  Maternal Aunt        breast ca/GYN    CURRENT MEDICATIONS:  Current Outpatient Medications  Medication Sig Dispense Refill  . albuterol (VENTOLIN HFA) 108 (90 Base) MCG/ACT inhaler Inhale 2 puffs into the lungs as needed. 2 puffs into lungs every 6 hours as needed for wheezing or SOB    . aspirin 325 MG EC tablet Take 325 mg by mouth daily.    . clonazePAM (KLONOPIN) 1 MG tablet Take 1 mg by mouth 3 (three) times daily as needed for anxiety.    . cyclobenzaprine (FLEXERIL) 5 MG tablet Take 1 tablet by mouth 3 (three) times daily as needed.    . docusate sodium (COLACE) 100 MG capsule Take 100 mg by mouth 2 (two) times daily.    . ergocalciferol (VITAMIN D2) 1.25 MG (50000 UT) capsule Take 1 capsule (50,000 Units total) by mouth once a week. 12 capsule 1  . furosemide (LASIX) 20 MG tablet TAKE 1 TABLET (20 MG TOTAL) BY MOUTH DAILY AS NEEDED FOR FLUID OR EDEMA. (Patient taking differently: 40 mg.) 30 tablet 6  . gabapentin (NEURONTIN) 300 MG capsule Take 3 capsules (900 mg total) by mouth 3 (three) times daily. 270 capsule 4  . hydrochlorothiazide (HYDRODIURIL) 25 MG tablet Take 25 mg by mouth daily.    Marland Kitchen HYDROmorphone (DILAUDID) 4 MG tablet Take 1 tablet (4 mg total) by mouth every 4 (four) hours as needed for severe pain. 180 tablet 0  . ibuprofen (ADVIL) 600 MG tablet Take 600 mg by mouth every 6 (six) hours as needed.    . lidocaine-prilocaine (EMLA) cream Apply 1 application topically as needed (port). 30 g 3  . lisinopril-hydrochlorothiazide (ZESTORETIC) 20-12.5 MG tablet Take 1 tablet by mouth 2 (two) times daily.    Marland Kitchen MARINOL 5 MG capsule Take 5 mg by mouth 3 (three) times daily.    . naloxone (NARCAN) 2 MG/2ML injection Place 2 mg into the nose as needed.     Marland Kitchen OLANZapine (ZYPREXA) 10 MG tablet Take 10 mg by mouth at bedtime.    . ondansetron (ZOFRAN) 8 MG tablet Take 1 tablet (8 mg total) by mouth every 8 (eight) hours as needed for nausea or vomiting. 60 tablet 0  . ondansetron  (ZOFRAN-ODT) 8 MG disintegrating tablet Take 1 tablet (8 mg total) by mouth every 8 (eight) hours as needed for nausea or vomiting. 60 tablet 1  . polyethylene glycol (MIRALAX / GLYCOLAX) 17 g packet Take 17 g by mouth  2 (two) times daily. 100 each 11  . prochlorperazine (COMPAZINE) 10 MG tablet Take 1 tablet (10 mg total) by mouth every 6 (six) hours as needed (Nausea or vomiting). 30 tablet 1  . promethazine (PHENERGAN) 12.5 MG tablet Take 1 tablet (12.5 mg total) by mouth every 6 (six) hours as needed for nausea or vomiting. 30 tablet 2  . rosuvastatin (CRESTOR) 40 MG tablet Take 40 mg by mouth daily.    Marland Kitchen senna (SENOKOT) 8.6 MG tablet Take 2 tablets (17.2 mg total) by mouth 3 (three) times daily. 90 tablet 1  . ZOLOFT 50 MG tablet Take 1 tablet by mouth as directed  take #1/2 tablet daily x one week then take #1 tablet daily    . morphine (MS CONTIN) 30 MG 12 hr tablet Take 1 tablet (30 mg total) by mouth every 8 (eight) hours. 90 tablet 0   No current facility-administered medications for this visit.   Facility-Administered Medications Ordered in Other Visits  Medication Dose Route Frequency Provider Last Rate Last Admin  . heparin lock flush 100 unit/mL  500 Units Intracatheter Once [name], MD        ALLERGIES:  Allergies  Allergen Reactions  . Latex Rash and Hives  . Sulfamethoxazole-Trimethoprim Other (See Comments)    Reaction:  Unknown  Other reaction(s): Unknown When 16, dr told her not to take    PHYSICAL EXAM:  Performance status (ECOG): 1 - Symptomatic but completely ambulatory  Vitals:   09/07/20 0853  BP: 127/88  Pulse: (!) 106  Resp: 18  Temp: (!) 96.8 F (36 C)  SpO2: 92%   Wt Readings from Last 3 Encounters:  09/07/20 204 lb 8 oz (92.8 kg)  08/24/20 201 lb 3.2 oz (91.3 kg)  08/17/20 206 lb 12.8 oz (93.8 kg)   Physical Exam Vitals reviewed.  Constitutional:      Appearance: Normal appearance.  Cardiovascular:     Rate and Rhythm: Normal rate and  regular rhythm.     Pulses: Normal pulses.     Heart sounds: Normal heart sounds.  Pulmonary:     Effort: Pulmonary effort is normal.     Breath sounds: Normal breath sounds.  Chest:     Comments: Port-a-Cath in L chest Abdominal:     Palpations: Abdomen is soft. There is no mass.     Tenderness: There is no abdominal tenderness.  Musculoskeletal:     Right lower leg: No edema.     Left lower leg: Edema present.  Neurological:     General: No focal deficit present.     Mental Status: She is alert and oriented to person, place, and time.  Psychiatric:        Mood and Affect: Mood normal.        Behavior: Behavior normal.     LABORATORY DATA:  I have reviewed the labs as listed.  CBC Latest Ref Rng & Units 09/07/2020 08/24/2020 08/17/2020  WBC 4.0 - 10.5 K/uL 7.5 5.9 6.9  Hemoglobin 12.0 - 15.0 g/dL 10.6(L) 11.3(L) 11.8(L)  Hematocrit 36.0 - 46.0 % 33.7(L) 34.7(L) 37.5  Platelets 150 - 400 K/uL 314 250 349   CMP Latest Ref Rng & Units 09/07/2020 08/24/2020 08/17/2020  Glucose 70 - 99 mg/dL 147(H) 146(H) 111(H)  BUN 6 - 20 mg/dL 12 27(H) 14  Creatinine 0.44 - 1.00 mg/dL 0.79 1.09(H) 0.65  Sodium 135 - 145 mmol/L 137 132(L) 138  Potassium 3.5 - 5.1 mmol/L 3.4(L) 3.0(L) 3.7  Chloride  98 - 111 mmol/L 100 96(L) 103  CO2 22 - 32 mmol/L _0 Calcium 8.9 - 10.3 mg/dL 9.0 8.8(L) 8.9  Total Protein 6.5 - 8.1 g/dL 6.7 6.7 6.5  Total Bilirubin 0.3 - 1.2 mg/dL 0.3 0.5 0.5  Alkaline Phos 38 - 126 U/L 86 98 86  AST 15 - 41 U/L 38 33 32  ALT 0 - 44 U/L 35 44 39   CA 125 235.0 (H) 08/24/2020  CA 125 285.0 (H) 07/31/2020  CA 125 308.0 (H) 07/23/2020    DIAGNOSTIC IMAGING:  I have independently reviewed the scans and discussed with the patient. CT Chest W Contrast  Result Date: 09/02/2020 CLINICAL DATA:  Metastatic ovarian cancer with peritoneal carcinomatosis, restaging. Prior surgery. Ongoing chemotherapy. EXAM: CT CHEST, ABDOMEN, AND PELVIS WITH CONTRAST TECHNIQUE: Multidetector CT  imaging of the chest, abdomen and pelvis was performed following the standard protocol during bolus administration of intravenous contrast. CONTRAST:  176m OMNIPAQUE IOHEXOL 300 MG/ML  SOLN COMPARISON:  Multiple exams, including 06/24/2020 and 04/03/2020 FINDINGS: CT CHEST FINDINGS Measurements are in comparison to the 04/03/2020 chest CT. Cardiovascular: Right Port-A-Cath tip: SVC Atherosclerotic calcification of the aortic arch. Mediastinum/Nodes: Right hilar lymph node 1.1 cm in short axis, mildly enlarged. Calcified nodules or tumor deposits in the pericardial adipose tissue including a 1.3 by 1.4 cm right anterior pericardial node or nodule on image 43 of series 2, previously 1.4 by 0.9 cm. A calcified a partially calcified left lower paratracheal lymph node measures 1.1 cm in short axis on image 43 series 2, formerly 0.9 cm. Lungs/Pleura: Small but loculated left pleural effusion thick pleural enhancement, pleural nodularity, and pleural calcification on the left. The degree of pleural fluid is increased. The appearance favors malignant left pleural effusion. There is atelectasis of most of the left lower lobe with considerable associated nodular calcifications along the atelectatic left lower lobe. Musculoskeletal: 1.0 by 0.6 cm sclerotic lesion in the sternal manubrium on image 35 series 3, much increased in conspicuity compared to the prior exam, favoring sclerotic reaction to prior osseous metastatic disease. Sometimes increased sclerosis can indicate a healing sclerotic lesion rather than necessarily indicating progression. Nodule laterally in the left breast measures 2.8 by 2.0 cm on image 25 series 2, essentially stable. CT ABDOMEN PELVIS FINDINGS Measurements are in comparison to the 06/24/2020 abdomen CT. Hepatobiliary: Formerly hypodense lesion in the dome of the right hepatic lobe is now mildly hyperdense and measures 1.1 by 0.8 cm on image 45 series 2, previously the same. This could reflect  enhancement or progressive calcification. A hypodense lesion in the lateral segment left hepatic lobe measures 2.0 by 1.7 cm on image 55 series 2, formerly 1.8 by 1.3 cm, hence mildly increased in size. The gallbladder is not visualized and presumed surgically absent. Scattered calcified nodules along the hepatic capsule are similar to the prior exam. Pancreas: Short tail of the pancreas. Spleen: Small calcified nodules along the splenic capsule generally similar to the prior exam. Adrenals/Urinary Tract: Unremarkable Stomach/Bowel: Unremarkable Vascular/Lymphatic: Aortoiliac atherosclerotic vascular disease. Fusiform infrarenal abdominal aortic aneurysm 3.4 cm in diameter on image 74 series 2, stable. Reproductive: Uterus absent.  Ovaries are indistinct. Other: Scattered omental nodularity is specially in the left upper quadrant compatible with tumor deposition. This has a similar distribution to the prior exam although slightly reduced soft tissue elements. For example, the thickness of a partially calcified band of soft tissue in the left upper quadrant omentum is 0.9 cm on image 58 of  series 2 and was previously 1.3 cm. Calcified tumor nodule along the falciform ligament is 1.3 by 0.9 cm on image 62 series 2, formerly 1.4 by 1.0 cm. Tumor nodularity just below the pylorus of the stomach appears similar to prior. Continued calcifications along the left paracolic gutter. Soft tissue density in the right lower quadrant measures 1.7 cm in thickness on image 111 series 2, previously 1.8 cm by my measurements. Musculoskeletal: Stable sclerotic lesion in the left greater trochanter on image 114 series 2. Grade 1 degenerative anterolisthesis at L4-5. Lower lumbar spondylosis and degenerative disc disease. IMPRESSION: 1. Mixed appearance, with some mild reduction in tumor nodularity along the omentum, but with mild increase in size of the hypodense lesion in the lateral segment left hepatic lobe. 2. There have also been  some evolutionary findings including increased density of the lesion in the dome of the right hepatic lobe, and increased sclerosis of a sternal manubrial lesion compatible with metastatic disease. The sclerotic left proximal femoral metastatic lesion is stable. 3. Increased malignant left pleural effusion. Atelectasis of most of the left lower lobe with scattered internal calcifications probably from tumor deposition within or along the atelectatic left lower lobe. 4. Other imaging findings of potential clinical significance: Fusiform infrarenal abdominal aortic aneurysm 3.4 cm in diameter, stable. Stable left breast nodule. Lower lumbar spondylosis and degenerative disc disease with grade 1 degenerative anterolisthesis at L4-5. 5. Aortic atherosclerosis. Aortic Atherosclerosis (ICD10-I70.0). Electronically Signed   By: Van Clines M.D.   On: 09/02/2020 12:58   CT Abdomen Pelvis W Contrast  Result Date: 09/02/2020 CLINICAL DATA:  Metastatic ovarian cancer with peritoneal carcinomatosis, restaging. Prior surgery. Ongoing chemotherapy. EXAM: CT CHEST, ABDOMEN, AND PELVIS WITH CONTRAST TECHNIQUE: Multidetector CT imaging of the chest, abdomen and pelvis was performed following the standard protocol during bolus administration of intravenous contrast. CONTRAST:  118m OMNIPAQUE IOHEXOL 300 MG/ML  SOLN COMPARISON:  Multiple exams, including 06/24/2020 and 04/03/2020 FINDINGS: CT CHEST FINDINGS Measurements are in comparison to the 04/03/2020 chest CT. Cardiovascular: Right Port-A-Cath tip: SVC Atherosclerotic calcification of the aortic arch. Mediastinum/Nodes: Right hilar lymph node 1.1 cm in short axis, mildly enlarged. Calcified nodules or tumor deposits in the pericardial adipose tissue including a 1.3 by 1.4 cm right anterior pericardial node or nodule on image 43 of series 2, previously 1.4 by 0.9 cm. A calcified a partially calcified left lower paratracheal lymph node measures 1.1 cm in short axis on  image 43 series 2, formerly 0.9 cm. Lungs/Pleura: Small but loculated left pleural effusion thick pleural enhancement, pleural nodularity, and pleural calcification on the left. The degree of pleural fluid is increased. The appearance favors malignant left pleural effusion. There is atelectasis of most of the left lower lobe with considerable associated nodular calcifications along the atelectatic left lower lobe. Musculoskeletal: 1.0 by 0.6 cm sclerotic lesion in the sternal manubrium on image 35 series 3, much increased in conspicuity compared to the prior exam, favoring sclerotic reaction to prior osseous metastatic disease. Sometimes increased sclerosis can indicate a healing sclerotic lesion rather than necessarily indicating progression. Nodule laterally in the left breast measures 2.8 by 2.0 cm on image 25 series 2, essentially stable. CT ABDOMEN PELVIS FINDINGS Measurements are in comparison to the 06/24/2020 abdomen CT. Hepatobiliary: Formerly hypodense lesion in the dome of the right hepatic lobe is now mildly hyperdense and measures 1.1 by 0.8 cm on image 45 series 2, previously the same. This could reflect enhancement or progressive calcification. A hypodense lesion in the  lateral segment left hepatic lobe measures 2.0 by 1.7 cm on image 55 series 2, formerly 1.8 by 1.3 cm, hence mildly increased in size. The gallbladder is not visualized and presumed surgically absent. Scattered calcified nodules along the hepatic capsule are similar to the prior exam. Pancreas: Short tail of the pancreas. Spleen: Small calcified nodules along the splenic capsule generally similar to the prior exam. Adrenals/Urinary Tract: Unremarkable Stomach/Bowel: Unremarkable Vascular/Lymphatic: Aortoiliac atherosclerotic vascular disease. Fusiform infrarenal abdominal aortic aneurysm 3.4 cm in diameter on image 74 series 2, stable. Reproductive: Uterus absent.  Ovaries are indistinct. Other: Scattered omental nodularity is  specially in the left upper quadrant compatible with tumor deposition. This has a similar distribution to the prior exam although slightly reduced soft tissue elements. For example, the thickness of a partially calcified band of soft tissue in the left upper quadrant omentum is 0.9 cm on image 58 of series 2 and was previously 1.3 cm. Calcified tumor nodule along the falciform ligament is 1.3 by 0.9 cm on image 62 series 2, formerly 1.4 by 1.0 cm. Tumor nodularity just below the pylorus of the stomach appears similar to prior. Continued calcifications along the left paracolic gutter. Soft tissue density in the right lower quadrant measures 1.7 cm in thickness on image 111 series 2, previously 1.8 cm by my measurements. Musculoskeletal: Stable sclerotic lesion in the left greater trochanter on image 114 series 2. Grade 1 degenerative anterolisthesis at L4-5. Lower lumbar spondylosis and degenerative disc disease. IMPRESSION: 1. Mixed appearance, with some mild reduction in tumor nodularity along the omentum, but with mild increase in size of the hypodense lesion in the lateral segment left hepatic lobe. 2. There have also been some evolutionary findings including increased density of the lesion in the dome of the right hepatic lobe, and increased sclerosis of a sternal manubrial lesion compatible with metastatic disease. The sclerotic left proximal femoral metastatic lesion is stable. 3. Increased malignant left pleural effusion. Atelectasis of most of the left lower lobe with scattered internal calcifications probably from tumor deposition within or along the atelectatic left lower lobe. 4. Other imaging findings of potential clinical significance: Fusiform infrarenal abdominal aortic aneurysm 3.4 cm in diameter, stable. Stable left breast nodule. Lower lumbar spondylosis and degenerative disc disease with grade 1 degenerative anterolisthesis at L4-5. 5. Aortic atherosclerosis. Aortic Atherosclerosis (ICD10-I70.0).  Electronically Signed   By: Van Clines M.D.   On: 09/02/2020 12:58     ASSESSMENT:  1.Stage IVa low-grade ovarian cancer: -05/05/2016-exploratory laparotomy, posterior exenteration, argon beam ablation of peritoneum, diaphragm stripping, cholecystectomy, omentectomy, appendectomy and optimal tumor debulking by Dr. Ovidio Hanger. -06/24/2016: Started Taxol/carboplatin. 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 Taxol discontinued cycle 5. -10/24/2016: Started maintenance letrozole. -March 2020bevacizumab started andcompleted22 cycles until 12/02/2019, discontinued due to TIA and uncontrolled hypertension. -Germline mutation testing reportedly done at [facility] was negative. -She is under the care of Dr. Merla Riches July 2021 -CTAP on 04/03/2020 with stable appearance of calcified omental and mesenteric nodules. Soft tissue fullness in the right adnexal region is stable. -MRI of the left hip on 04/23/2020 with 18 mm bone lesion in the peripheral superior aspect of the left greater trochanter -XRT to left femur lesion, 30 Gray from 05/06/2020 through 05/20/2020. -CTAP on 06/24/2020 with overall progression of calcified and noncalcified peritoneal surface and omental lesions with slight progression of disease at the left lung base involving the pleura. Stable sclerotic bone lesion involving the left greater trochanter. -3 cycles of carboplatin and gemcitabine from  07/03/2020 through 08/17/2020.  2. Social/family history: -Lives by herself. Worked as a Land. 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 Quit smoking 2 years ago.  3. Abdominal aneurysm: -CTAP on 06/24/2020 shows stable 3.4 x 3.3 cm infrarenal abdominal aortic aneurysm. -Used to follow with vascular surgery at [facility].   PLAN:  1.Stage IVa low-grade ovarian cancer: -She has completed 3 cycles of chemotherapy. -She reported worsening swelling of the lower extremity, particularly left leg with  pain.  Pain occurs for 2 days after each treatment.  Pain and swelling get better when she is off treatment. -Reviewed CT CAP from 09/02/2020.  Right hilar lymph node slightly increased in size.  Calcified left lower paratracheal lymph node also slightly increased in size.  CT chest was compared to prior CT scan from 04/03/2020.  New chemotherapy was started in January 2022. -CTAP showed hypodense lesion in the dome of the right hepatic lobe is mildly hyperdense measuring 1.1 x 0.8 cm, previously the same.  Hypodense lesion in the lateral segment of the left hepatic lobe measures 2 x 1.7 cm, formerly 1.8 x 1.3 cm. -Her CA-125 is steadily improving, last was 235 down from 332 on 07/10/2020. -Taking tumor markers and the CT scan together, I do not believe there is any progression.  However she is having difficulty tolerating gemcitabine-based regimen due to leg swellings and pain. -Recommend holding treatment at this time and reevaluate her in 2 weeks. -We will likely have to change her treatment plan.  Continuing single agent carboplatin is an option.  I will single agent options include capecitabine, irinotecan, pemetrexed and vinorelbine. -We will also consider checking somatic mutation testing.  We will consider checking MMR/MSI testing.  2. Back pain/neuropathy: -Continue MS Contin 30 mg every 8 hours. -Continue Dilaudid 4 mg every 4 hours as needed for breakthrough pain. -Continue gabapentin 900 mg 3 times daily.  3. Hypertension: -Continue lisinopril/HCTZ daily.  4. Nausea/vomiting: -Continue Compazine/Phenergan/Zofran as needed.  5.  Lower extremity swelling: -This is predominantly in the left leg.  Doppler in the [facility] ER was negative for DVT. -Continue Lasix as needed.   Orders placed this encounter:  No orders of the defined types were placed in this encounter.    Derek Jack, MD [place] [phone]   I, Milinda Antis, am acting as a  scribe for Dr. Sanda Linger.  I, Derek Jack MD, have reviewed the above documentation for accuracy and completeness, and I agree with the above.

## 2020-09-10 ENCOUNTER — Other Ambulatory Visit (HOSPITAL_COMMUNITY): Payer: Self-pay

## 2020-09-10 MED ORDER — HYDROMORPHONE HCL 4 MG PO TABS: 4.0000 mg | ORAL_TABLET | ORAL | 0 refills | Status: DC | PRN

## 2020-09-11 ENCOUNTER — Other Ambulatory Visit (HOSPITAL_COMMUNITY): Payer: Self-pay | Admitting: Hematology

## 2020-09-14 ENCOUNTER — Ambulatory Visit (HOSPITAL_COMMUNITY): Payer: Medicare Other

## 2020-09-14 ENCOUNTER — Other Ambulatory Visit (HOSPITAL_COMMUNITY): Payer: Medicare Other

## 2020-09-18 NOTE — Progress Notes (Signed)
Patient experienced mild reaction with carboplatin with cycle #3, orders received to add famotidine 20 mg ivpb and diphenhydramine 50 mg ivpush as premedication for carboplatin.  T.O. Dr Rhys Martini, PharmD 09/18/20 @ 1000

## 2020-09-22 ENCOUNTER — Inpatient Hospital Stay (HOSPITAL_BASED_OUTPATIENT_CLINIC_OR_DEPARTMENT_OTHER): Payer: Medicare Other | Admitting: Hematology

## 2020-09-22 ENCOUNTER — Inpatient Hospital Stay (HOSPITAL_COMMUNITY): Payer: Medicare Other | Attending: Hematology

## 2020-09-22 ENCOUNTER — Other Ambulatory Visit (HOSPITAL_COMMUNITY): Payer: Self-pay

## 2020-09-22 ENCOUNTER — Inpatient Hospital Stay (HOSPITAL_COMMUNITY): Payer: Medicare Other

## 2020-09-22 ENCOUNTER — Other Ambulatory Visit: Payer: Self-pay

## 2020-09-22 ENCOUNTER — Other Ambulatory Visit (HOSPITAL_COMMUNITY): Payer: Self-pay | Admitting: Hematology

## 2020-09-22 ENCOUNTER — Encounter (HOSPITAL_COMMUNITY): Payer: Self-pay

## 2020-09-22 VITALS — BP 105/67 | HR 100 | Temp 96.9°F | Resp 18

## 2020-09-22 VITALS — BP 128/95 | HR 99 | Temp 96.9°F | Resp 19 | Wt 195.7 lb

## 2020-09-22 DIAGNOSIS — Z87891 Personal history of nicotine dependence: Secondary | ICD-10-CM | POA: Insufficient documentation

## 2020-09-22 DIAGNOSIS — Z79899 Other long term (current) drug therapy: Secondary | ICD-10-CM | POA: Diagnosis not present

## 2020-09-22 DIAGNOSIS — M7989 Other specified soft tissue disorders: Secondary | ICD-10-CM | POA: Insufficient documentation

## 2020-09-22 DIAGNOSIS — M47819 Spondylosis without myelopathy or radiculopathy, site unspecified: Secondary | ICD-10-CM | POA: Diagnosis not present

## 2020-09-22 DIAGNOSIS — Z803 Family history of malignant neoplasm of breast: Secondary | ICD-10-CM | POA: Insufficient documentation

## 2020-09-22 DIAGNOSIS — I700 Atherosclerosis of aorta: Secondary | ICD-10-CM | POA: Insufficient documentation

## 2020-09-22 DIAGNOSIS — R112 Nausea with vomiting, unspecified: Secondary | ICD-10-CM | POA: Insufficient documentation

## 2020-09-22 DIAGNOSIS — I714 Abdominal aortic aneurysm, without rupture: Secondary | ICD-10-CM | POA: Insufficient documentation

## 2020-09-22 DIAGNOSIS — Z7189 Other specified counseling: Secondary | ICD-10-CM

## 2020-09-22 DIAGNOSIS — I10 Essential (primary) hypertension: Secondary | ICD-10-CM | POA: Insufficient documentation

## 2020-09-22 DIAGNOSIS — C786 Secondary malignant neoplasm of retroperitoneum and peritoneum: Secondary | ICD-10-CM

## 2020-09-22 DIAGNOSIS — R200 Anesthesia of skin: Secondary | ICD-10-CM | POA: Insufficient documentation

## 2020-09-22 DIAGNOSIS — G629 Polyneuropathy, unspecified: Secondary | ICD-10-CM | POA: Insufficient documentation

## 2020-09-22 DIAGNOSIS — R609 Edema, unspecified: Secondary | ICD-10-CM | POA: Diagnosis not present

## 2020-09-22 DIAGNOSIS — C569 Malignant neoplasm of unspecified ovary: Secondary | ICD-10-CM

## 2020-09-22 DIAGNOSIS — E876 Hypokalemia: Secondary | ICD-10-CM | POA: Insufficient documentation

## 2020-09-22 DIAGNOSIS — Z8673 Personal history of transient ischemic attack (TIA), and cerebral infarction without residual deficits: Secondary | ICD-10-CM | POA: Insufficient documentation

## 2020-09-22 DIAGNOSIS — Z79811 Long term (current) use of aromatase inhibitors: Secondary | ICD-10-CM | POA: Insufficient documentation

## 2020-09-22 DIAGNOSIS — C782 Secondary malignant neoplasm of pleura: Secondary | ICD-10-CM | POA: Diagnosis not present

## 2020-09-22 DIAGNOSIS — J910 Malignant pleural effusion: Secondary | ICD-10-CM | POA: Insufficient documentation

## 2020-09-22 DIAGNOSIS — N6320 Unspecified lump in the left breast, unspecified quadrant: Secondary | ICD-10-CM | POA: Insufficient documentation

## 2020-09-22 DIAGNOSIS — M549 Dorsalgia, unspecified: Secondary | ICD-10-CM | POA: Diagnosis not present

## 2020-09-22 DIAGNOSIS — Z9049 Acquired absence of other specified parts of digestive tract: Secondary | ICD-10-CM | POA: Insufficient documentation

## 2020-09-22 DIAGNOSIS — Z5111 Encounter for antineoplastic chemotherapy: Secondary | ICD-10-CM | POA: Diagnosis present

## 2020-09-22 DIAGNOSIS — F1290 Cannabis use, unspecified, uncomplicated: Secondary | ICD-10-CM | POA: Diagnosis not present

## 2020-09-22 DIAGNOSIS — I2510 Atherosclerotic heart disease of native coronary artery without angina pectoris: Secondary | ICD-10-CM | POA: Diagnosis not present

## 2020-09-22 DIAGNOSIS — J984 Other disorders of lung: Secondary | ICD-10-CM | POA: Insufficient documentation

## 2020-09-22 DIAGNOSIS — Z881 Allergy status to other antibiotic agents status: Secondary | ICD-10-CM | POA: Insufficient documentation

## 2020-09-22 LAB — COMPREHENSIVE METABOLIC PANEL
ALT: 15 U/L (ref 0–44)
AST: 23 U/L (ref 15–41)
Albumin: 3.7 g/dL (ref 3.5–5.0)
Alkaline Phosphatase: 77 U/L (ref 38–126)
Anion gap: 11 (ref 5–15)
BUN: 13 mg/dL (ref 6–20)
CO2: 27 mmol/L (ref 22–32)
Calcium: 9.1 mg/dL (ref 8.9–10.3)
Chloride: 98 mmol/L (ref 98–111)
Creatinine, Ser: 0.59 mg/dL (ref 0.44–1.00)
GFR, Estimated: >60 mL/min (ref >60)
Glucose, Bld: 142 mg/dL — ABNORMAL HIGH (ref 70–99)
Potassium: 3.1 mmol/L — ABNORMAL LOW (ref 3.5–5.1)
Sodium: 136 mmol/L (ref 135–145)
Total Bilirubin: 0.6 mg/dL (ref 0.3–1.2)
Total Protein: 7.1 g/dL (ref 6.5–8.1)

## 2020-09-22 LAB — CBC WITH DIFFERENTIAL/PLATELET
Abs Immature Granulocytes: 0.03 10*3/uL (ref 0.00–0.07)
Basophils Absolute: 0.1 10*3/uL (ref 0.0–0.1)
Basophils Relative: 2 %
Eosinophils Absolute: 0.2 10*3/uL (ref 0.0–0.5)
Eosinophils Relative: 3 %
HCT: 37.3 % (ref 36.0–46.0)
Hemoglobin: 11.8 g/dL — ABNORMAL LOW (ref 12.0–15.0)
Immature Granulocytes: 1 %
Lymphocytes Relative: 24 %
Lymphs Abs: 1.6 10*3/uL (ref 0.7–4.0)
MCH: 30 pg (ref 26.0–34.0)
MCHC: 31.6 g/dL (ref 30.0–36.0)
MCV: 94.9 fL (ref 80.0–100.0)
Monocytes Absolute: 0.7 10*3/uL (ref 0.1–1.0)
Monocytes Relative: 10 %
Neutro Abs: 4 10*3/uL (ref 1.7–7.7)
Neutrophils Relative %: 60 %
Platelets: 283 10*3/uL (ref 150–400)
RBC: 3.93 MIL/uL (ref 3.87–5.11)
RDW: 16.6 % — ABNORMAL HIGH (ref 11.5–15.5)
WBC: 6.6 10*3/uL (ref 4.0–10.5)
nRBC: 0 % (ref 0.0–0.2)

## 2020-09-22 MED ORDER — PROCHLORPERAZINE EDISYLATE 10 MG/2ML IJ SOLN
INTRAMUSCULAR | Status: AC
  Filled 2020-09-22: qty 2

## 2020-09-22 MED ORDER — FAMOTIDINE IN NACL 20-0.9 MG/50ML-% IV SOLN
INTRAVENOUS | Status: AC
  Filled 2020-09-22: qty 50

## 2020-09-22 MED ORDER — SODIUM CHLORIDE 0.9 % IV SOLN
10.0000 mg | Freq: Once | INTRAVENOUS | Status: AC
  Administered 2020-09-22: 10 mg via INTRAVENOUS
  Filled 2020-09-22: qty 10

## 2020-09-22 MED ORDER — PALONOSETRON HCL INJECTION 0.25 MG/5ML
0.2500 mg | Freq: Once | INTRAVENOUS | Status: AC
Start: 2020-09-22 — End: 2020-09-22
  Administered 2020-09-22: 0.25 mg via INTRAVENOUS

## 2020-09-22 MED ORDER — SODIUM CHLORIDE 0.9 % IV SOLN: Freq: Once | INTRAVENOUS | Status: AC

## 2020-09-22 MED ORDER — SODIUM CHLORIDE 0.9 % IV SOLN
150.0000 mg | Freq: Once | INTRAVENOUS | Status: AC
  Administered 2020-09-22: 150 mg via INTRAVENOUS
  Filled 2020-09-22: qty 150

## 2020-09-22 MED ORDER — POTASSIUM CHLORIDE CRYS ER 10 MEQ PO TBCR
40.0000 meq | EXTENDED_RELEASE_TABLET | Freq: Once | ORAL | Status: AC
  Administered 2020-09-22: 40 meq via ORAL
  Filled 2020-09-22: qty 4

## 2020-09-22 MED ORDER — POTASSIUM CHLORIDE CRYS ER 10 MEQ PO TBCR: 10.0000 meq | EXTENDED_RELEASE_TABLET | Freq: Once | ORAL | 0 refills | Status: DC

## 2020-09-22 MED ORDER — ONDANSETRON HCL 8 MG PO TABS: 8.0000 mg | ORAL_TABLET | Freq: Three times a day (TID) | ORAL | 0 refills | Status: DC | PRN

## 2020-09-22 MED ORDER — SODIUM CHLORIDE 0.9 % IV SOLN
564.8000 mg | Freq: Once | INTRAVENOUS | Status: AC
  Administered 2020-09-22: 560 mg via INTRAVENOUS
  Filled 2020-09-22: qty 56

## 2020-09-22 MED ORDER — SODIUM CHLORIDE 0.9% FLUSH
10.0000 mL | INTRAVENOUS | Status: DC | PRN
  Administered 2020-09-22: 10 mL

## 2020-09-22 MED ORDER — PROCHLORPERAZINE EDISYLATE 10 MG/2ML IJ SOLN
10.0000 mg | Freq: Once | INTRAMUSCULAR | Status: AC
  Administered 2020-09-22: 10 mg via INTRAVENOUS

## 2020-09-22 MED ORDER — GABAPENTIN 300 MG PO CAPS: 900.0000 mg | ORAL_CAPSULE | Freq: Three times a day (TID) | ORAL | 4 refills | Status: DC

## 2020-09-22 MED ORDER — DIPHENHYDRAMINE HCL 50 MG/ML IJ SOLN
INTRAMUSCULAR | Status: AC
  Filled 2020-09-22: qty 1

## 2020-09-22 MED ORDER — FAMOTIDINE IN NACL 20-0.9 MG/50ML-% IV SOLN
20.0000 mg | Freq: Once | INTRAVENOUS | Status: AC
  Administered 2020-09-22: 20 mg via INTRAVENOUS

## 2020-09-22 MED ORDER — HEPARIN SOD (PORK) LOCK FLUSH 100 UNIT/ML IV SOLN
500.0000 [IU] | Freq: Once | INTRAVENOUS | Status: AC | PRN
  Administered 2020-09-22: 500 [IU]

## 2020-09-22 MED ORDER — DIPHENHYDRAMINE HCL 50 MG/ML IJ SOLN
50.0000 mg | Freq: Once | INTRAMUSCULAR | Status: AC
  Administered 2020-09-22: 50 mg via INTRAVENOUS

## 2020-09-22 MED ORDER — PALONOSETRON HCL INJECTION 0.25 MG/5ML
INTRAVENOUS | Status: AC
  Filled 2020-09-22: qty 5

## 2020-09-22 NOTE — Patient Instructions (Signed)
Carboplatin infusion What is this medicine? CARBOPLATIN (KAR boe pla tin) is a chemotherapy drug. It targets fast dividing cells, like cancer cells, and causes these cells to die. This medicine is used to treat ovarian cancer and many other cancers. This medicine may be used for other purposes; ask your health care provider or pharmacist if you have questions. COMMON BRAND NAME(S): Paraplatin What should I tell my health care provider before I take this medicine? They need to know if you have any of these conditions:  blood disorders  hearing problems  kidney disease  recent or ongoing radiation therapy  an unusual or allergic reaction to carboplatin, cisplatin, other chemotherapy, other medicines, foods, dyes, or preservatives  pregnant or trying to get pregnant  breast-feeding How should I use this medicine? This drug is usually given as an infusion into a vein. It is administered in a hospital or clinic by a specially trained health care professional. Talk to your pediatrician regarding the use of this medicine in children. Special care may be needed. Overdosage: If you think you have taken too much of this medicine contact a poison control center or emergency room at once. NOTE: This medicine is only for you. Do not share this medicine with others. What if I miss a dose? It is important not to miss a dose. Call your doctor or health care professional if you are unable to keep an appointment. What may interact with this medicine?  medicines for seizures  medicines to increase blood counts like filgrastim, pegfilgrastim, sargramostim  some antibiotics like amikacin, gentamicin, neomycin, streptomycin, tobramycin  vaccines Talk to your doctor or health care professional before taking any of these medicines:  acetaminophen  aspirin  ibuprofen  ketoprofen  naproxen This list may not describe all possible interactions. Give your health care provider a list of all the  medicines, herbs, non-prescription drugs, or dietary supplements you use. Also tell them if you smoke, drink alcohol, or use illegal drugs. Some items may interact with your medicine. What should I watch for while using this medicine? Your condition will be monitored carefully while you are receiving this medicine. You will need important blood work done while you are taking this medicine. This drug may make you feel generally unwell. This is not uncommon, as chemotherapy can affect healthy cells as well as cancer cells. Report any side effects. Continue your course of treatment even though you feel ill unless your doctor tells you to stop. In some cases, you may be given additional medicines to help with side effects. Follow all directions for their use. Call your doctor or health care professional for advice if you get a fever, chills or sore throat, or other symptoms of a cold or flu. Do not treat yourself. This drug decreases your body's ability to fight infections. Try to avoid being around people who are sick. This medicine may increase your risk to bruise or bleed. Call your doctor or health care professional if you notice any unusual bleeding. Be careful brushing and flossing your teeth or using a toothpick because you may get an infection or bleed more easily. If you have any dental work done, tell your dentist you are receiving this medicine. Avoid taking products that contain aspirin, acetaminophen, ibuprofen, naproxen, or ketoprofen unless instructed by your doctor. These medicines may hide a fever. Do not become pregnant while taking this medicine. Women should inform their doctor if they wish to become pregnant or think they might be pregnant. There is a  potential for serious side effects to an unborn child. Talk to your health care professional or pharmacist for more information. Do not breast-feed an infant while taking this medicine. What side effects may I notice from receiving this  medicine? Side effects that you should report to your doctor or health care professional as soon as possible:  allergic reactions like skin rash, itching or hives, swelling of the face, lips, or tongue  signs of infection - fever or chills, cough, sore throat, pain or difficulty passing urine  signs of decreased platelets or bleeding - bruising, pinpoint red spots on the skin, black, tarry stools, nosebleeds  signs of decreased red blood cells - unusually weak or tired, fainting spells, lightheadedness  breathing problems  changes in hearing  changes in vision  chest pain  high blood pressure  low blood counts - This drug may decrease the number of white blood cells, red blood cells and platelets. You may be at increased risk for infections and bleeding.  nausea and vomiting  pain, swelling, redness or irritation at the injection site  pain, tingling, numbness in the hands or feet  problems with balance, talking, walking  trouble passing urine or change in the amount of urine Side effects that usually do not require medical attention (report to your doctor or health care professional if they continue or are bothersome):  hair loss  loss of appetite  metallic taste in the mouth or changes in taste This list may not describe all possible side effects. Call your doctor for medical advice about side effects. You may report side effects to FDA at 1-800-FDA-1088. Where should I keep my medicine? This drug is given in a hospital or clinic and will not be stored at home. NOTE: This sheet is a summary. It may not cover all possible information. If you have questions about this medicine, talk to your doctor, pharmacist, or health care provider.  2021 Elsevier/Gold Standard (2007-09-11 14:38:05)

## 2020-09-22 NOTE — Progress Notes (Signed)
Patient was assessed by Dr. Delton Coombes and labs have been reviewed.  Patient is okay to proceed with treatment today. Dr. Delton Coombes ordering Potassium 40 meq once by mouth today and 20 meq once daily at home. Primary RN and pharmacy aware.

## 2020-09-22 NOTE — Progress Notes (Signed)
Patient presents today for Gemzar/Carboplatin infusions.  Vital signs within parameters for treatment.  Labs pending.  Patients only complaint is of leg pain and swelling.  Labs within parameters for treatment.  Per Dr.Katragadda patient is only receiving Carboplatin today.  Carboplatin infusion given today per MD orders.  Stable during infusion, after infusion patient complains of nausea.  Dr. Delton Coombes notified and patient given IV Compazine per MD order.  Vital signs stable.  No complaints at this time.  Discharge from clinic via wheelchair in stable condition.  Alert and oriented X 3.  Follow up with [facility] as scheduled.

## 2020-09-22 NOTE — Progress Notes (Signed)
Heidi Johnson, [place] [address]   CLINIC:  Medical Oncology/Hematology  PCP:  Default, Provider, MD None None   REASON FOR VISIT:  Follow-up for ovarian cancer and peritoneal carcinomatosis  PRIOR THERAPY:  1. Exploratory laparotomy, posterior exenteration, argon beam ablation of peritoneum, omentectomy and optimal tumor debulking on 05/05/2016. 2. Carboplatin and paclitaxel x 5 cycles from 06/24/2016. 3. Avastin x 22 cycles through 12/02/2019 discontinued due to TIA and hypertension.  NGS Results: Not done  CURRENT THERAPY: Carboplatin, gemcitabine and Aloxi every 3 weeks  BRIEF ONCOLOGIC HISTORY:  Oncology History Overview Note  Negative genetics at [facility]   Ovarian cancer ([place])  04/25/2016 Imaging   CT confirms 12cm ovarian masses with extensive peritoneal metastases and malignant pleural effusions   05/05/2016 Surgery   s/p ex-lap posterior exenteration, diaphragm stripping, cholecystectomy, omentectomy, appendectomy and optimal tumor debulking on 05/05/16   06/24/2016 - 10/24/2016 Chemotherapy   Started Taxol/Carboplatin. 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  Numerous left predominant calcified pleural metastases and associated small left pleural effusion are unchanged.  2.  Unchanged (3.0 cm) (series 3, image 96) left breast mass    02/07/2019 Imaging   Outside Ct abdomen and pelvis 1. Overall stable small volume peritoneal tumor, as described above.  2. Unchanged infrarenal abdominal aortic aneurysm.    02/22/2019  Tumor Marker   Patient's tumor was tested for the following markers: CA-125 Results of the tumor marker test revealed 164   03/18/2019 Tumor Marker   Patient's tumor was tested for the following markers: CA-125 Results of the tumor marker test revealed 162   04/08/2019 Tumor Marker   Patient's tumor was tested for the following markers: CA-125 Results of the tumor marker test revealed 164   04/29/2019 Tumor Marker   Patient's tumor was tested for the following markers: CA-125 Results of the tumor marker test revealed 181   05/20/2019 Tumor Marker   Patient's tumor was tested for the following markers: CA-125 Results of the tumor marker test revealed 164   06/10/2019 Tumor Marker   Patient's tumor was tested for the following markers: CA-125 Results of the tumor marker test revealed 152   07/01/2019 Tumor Marker   Patient's tumor was tested for the following markers: CA-125 Results of the tumor marker test revealed 160   07/22/2019 Tumor Marker   Patient's tumor was tested for the following markers: CA-125 Results of the tumor marker test revealed 171   08/12/2019 Tumor Marker   Patient's tumor was tested for the following markers: CA-125 Results of the tumor marker test revealed 173   09/02/2019 Tumor Marker   Patient's tumor was tested for the following markers: CA-125 Results of the tumor marker test revealed 159   09/30/2019 Tumor Marker   Patient's tumor was tested for the following markers: CA-125 Results of the tumor marker test revealed 168   11/08/2019 Imaging   Outside CT imaging 1. Study was performed at outside institution on 11/08/2019 and presented for review on 11/14/2019.  2. Stable calcified and noncalcified left-sided  nodular and plaque-like pleural abnormalities with small chronic left pleural effusion compatible with stable treated pleural metastasis. No new or enlarging lesions.  3. No enlarging thoracic lymph nodes.  4. Redemonstration of left breast mass.  Correlation with mammography is suggested if not already performed   12/02/2019 Tumor Marker   Patient's tumor was tested for the following markers: CA-125 Results of the tumor marker test revealed 186   12/18/2019 Cancer Staging   Staging form: Ovary, Fallopian Tube, and Primary Peritoneal Carcinoma, AJCC 8th Edition - Clinical stage from 12/18/2019: FIGO Stage IV (rcT3c, cN0, cM1) - Signed by [name], MD on 04/24/2020   01/09/2020 Tumor Marker   Patient's tumor was tested for the following markers: CA-125 Results of the tumor marker test revealed 184   01/10/2020 Imaging   1. Treated left pleural and peritoneal metastatic disease. Areas of residual soft tissue prominence in the right anatomic pelvis are indeterminate. Comparison with prior exams would be helpful. 2. Hepatomegaly. Low-attenuation lesions in the liver are too small to characterize. Comparison with prior exams would be helpful. 3. Small left fibrothorax. 4. Infrarenal Aortic aneurysm NOS (ICD10-I71.9). Recommend followup by ultrasound in 2 years.  5. Aortic atherosclerosis (ICD10-I70.0). Coronary artery calcification.   03/09/2020 Tumor Marker   Patient's tumor was tested for the following markers: CA-125 Results of the tumor marker test revealed 214   04/01/2020 Tumor Marker   Patient's tumor was tested for the following markers: CA-125 Results of the tumor marker test revealed 245.   04/03/2020 Imaging   1. No acute findings in the abdomen or pelvis. Specifically, no findings to explain the patient's history of left upper quadrant pain with nausea and vomiting. 2. Stable appearance of calcified omental and mesenteric nodules consistent with treated metastatic disease. Soft tissue fullness seen in the right adnexal region on the prior exam is similar today. 3. Stable 1.6 x 1.2 cm low-density lesion lateral segment left liver. 4. 3.5 cm infrarenal abdominal aortic aneurysm. Recommend follow-up ultrasound every 2 years.  This recommendation follows ACR consensus guidelines: White Paper of the ACR Incidental Findings Committee II on Vascular Findings. J Am Coll Radiol 2013; 92:119-417. 5. Aortic Atherosclerosis (ICD10-I70.0).   04/15/2020 Imaging   Focus of abnormal uptake is seen involving the greater trochanter of the proximal left femur which corresponds to sclerotic density seen on prior CT scan and is concerning for metastatic disease.   04/23/2020 Imaging   1. No hip fracture, dislocation or avascular necrosis. 2. A 18 mm bone lesion in the peripheral superior aspect of the left greater trochanter with enhancement on postcontrast imaging. The overall appearance is most concerning for metastatic disease in the setting of known malignancy. 3. 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 4. Degenerative disease with disc height loss at L4-5 and L5-S1 with bilateral facet arthropathy.   06/08/2020 Tumor Marker   Patient's tumor was tested for the following markers: CA-125. Results of the tumor marker test revealed 272.   06/24/2020 Imaging   1. Overall progression of calcified and noncalcified peritoneal surface and omental lesions. 2. Slight progression of disease at the left lung base and involving the pleura. 3. Stable sclerotic bone lesion involving the left greater trochanter. 4. Stable infrarenal abdominal aortic aneurysm.   07/03/2020 -  Chemotherapy    Patient is on Treatment Plan: OVARIAN RECURRENT 3RD LINE CARBOPLATIN D1 / GEMCITABINE D1,8 (4/800) Q21D      07/03/2020 Tumor Marker   Patient's tumor was tested for the following markers: CA-125  Results of the tumor marker test revealed 285.   07/10/2020 Tumor Marker   Patient's tumor was tested for the following markers: CA-125 Results of the tumor marker test revealed 332   07/23/2020 Tumor Marker   Patient's tumor was tested for the following markers: CA-125 Results of the tumor marker test revealed 308     CANCER STAGING: Cancer  Staging Ovarian cancer [facility]) Staging form: Ovary, Fallopian Tube, and Primary Peritoneal Carcinoma, AJCC 8th Edition - Clinical stage from 12/18/2019: FIGO Stage IV (rcT3c, cN0, cM1) - Signed by [name], MD on 04/24/2020   INTERVAL HISTORY:  Ms. JAEDAN SCHUMAN, a 50 y.o. female, returns for routine follow-up and consideration for next cycle of chemotherapy. Angelene was last seen on 09/07/2020.  Due for cycle #4 of carboplatin, gemcitabine and Aloxi today.   Today she is accompanied by her daughter. Overall, she tells me she has been feeling poorly. She takes gabapentin 900 mg TID but continues having swelling, erythema and shooting pain from her knees to her toes, more prominent in her left leg. She is still not walking as much at home and only gets up to go the bathroom. Her legs gave out last night and she almost collapsed. She notes that the Dilaudid and MS Contin do not help control the pain. She notes that she has been nausea and vomiting for several days and her appetite has decreased.  Overall, she feels ready for next cycle of chemo today.    REVIEW OF SYSTEMS:  Review of Systems  Constitutional: Positive for appetite change (depleted), fatigue (25%) and unexpected weight change (lost 9 lbs in 2 weeks).  Cardiovascular: Positive for leg swelling (below knees bilat).  Gastrointestinal: Positive for nausea and vomiting.  Musculoskeletal: Positive for gait problem.  Neurological: Positive for gait problem and numbness (burning in lower legs).  All other systems reviewed and are negative.   PAST MEDICAL/SURGICAL HISTORY:  Past Medical History:  Diagnosis Date  . Cancer ([place])    ovarian cancer  . CHF (congestive heart failure) ([place])   . Hypertension   . Panic attacks   . Stroke [facility])    Past Surgical History:  Procedure Laterality Date  . ABDOMINAL HYSTERECTOMY    . BACK SURGERY    . TUBAL LIGATION      SOCIAL HISTORY:  Social History   Socioeconomic History  .  Marital status: Divorced    Spouse name: Not on file  . Number of children: 3  . Years of education: Not on file  . Highest education level: Not on file  Occupational History  . Occupation: retired  Tobacco Use  . Smoking status: Former Smoker    Types: Cigarettes    Quit date: 06/20/2018    Years since quitting: 2.2  . Smokeless tobacco: Never Used  Substance and Sexual Activity  . Alcohol use: No  . Drug use: Yes    Types: Marijuana  . Sexual activity: Yes  Other Topics Concern  . Not on file  Social History Narrative   Lives alone   Social Determinants of Health   Financial Resource Strain: High Risk  . Difficulty of Paying Living Expenses: Hard  Food Insecurity: No Food Insecurity  . Worried About Charity fundraiser in the Last Year: Never true  . Ran Out of Food in the Last Year: Never true  Transportation Needs: No Transportation Needs  . Lack of Transportation (Medical): No  . Lack of Transportation (Non-Medical): No  Physical Activity:  Inactive  . Days of Exercise per Week: 0 days  . Minutes of Exercise per Session: 0 min  Stress: No Stress Concern Present  . Feeling of Stress : Only a little  Social Connections: Moderately Isolated  . Frequency of Communication with Friends and Family: Twice a week  . Frequency of Social Gatherings with Friends and Family: Twice a week  . Attends Religious Services: 1 to 4 times per year  . Active Member of Clubs or Organizations: No  . Attends Archivist Meetings: Never  . Marital Status: Divorced  Human resources officer Violence: Not At Risk  . Fear of Current or Ex-Partner: No  . Emotionally Abused: No  . Physically Abused: No  . Sexually Abused: No    FAMILY HISTORY:  Family History  Problem Relation Age of Onset  . Cancer Maternal Aunt        breast ca/GYN    CURRENT MEDICATIONS:  Current Outpatient Medications  Medication Sig Dispense Refill  . albuterol (VENTOLIN HFA) 108 (90 Base) MCG/ACT inhaler  Inhale 2 puffs into the lungs as needed. 2 puffs into lungs every 6 hours as needed for wheezing or SOB    . aspirin 325 MG EC tablet Take 325 mg by mouth daily.    . clonazePAM (KLONOPIN) 1 MG tablet Take 1 mg by mouth 3 (three) times daily as needed for anxiety.    . cyclobenzaprine (FLEXERIL) 5 MG tablet Take 1 tablet by mouth 3 (three) times daily as needed.    . docusate sodium (COLACE) 100 MG capsule Take 100 mg by mouth 2 (two) times daily.    . ergocalciferol (VITAMIN D2) 1.25 MG (50000 UT) capsule Take 1 capsule (50,000 Units total) by mouth once a week. 12 capsule 1  . furosemide (LASIX) 20 MG tablet TAKE 2 TABLETS (40 MG TOTAL) BY MOUTH DAILY AS NEEDED FOR FLUID OR EDEMA. 60 tablet 1  . gabapentin (NEURONTIN) 300 MG capsule Take 3 capsules (900 mg total) by mouth 3 (three) times daily. 270 capsule 4  . hydrochlorothiazide (HYDRODIURIL) 25 MG tablet Take 25 mg by mouth daily.    Marland Kitchen HYDROmorphone (DILAUDID) 4 MG tablet Take 1 tablet (4 mg total) by mouth every 4 (four) hours as needed for severe pain. 180 tablet 0  . ibuprofen (ADVIL) 600 MG tablet Take 600 mg by mouth every 6 (six) hours as needed.    Marland Kitchen lisinopril-hydrochlorothiazide (ZESTORETIC) 20-12.5 MG tablet Take 1 tablet by mouth 2 (two) times daily.    Marland Kitchen MARINOL 5 MG capsule Take 5 mg by mouth 3 (three) times daily.    Marland Kitchen morphine (MS CONTIN) 30 MG 12 hr tablet Take 1 tablet (30 mg total) by mouth every 8 (eight) hours. 90 tablet 0  . naloxone (NARCAN) 2 MG/2ML injection Place 2 mg into the nose as needed.     Marland Kitchen OLANZapine (ZYPREXA) 10 MG tablet Take 10 mg by mouth at bedtime.    . ondansetron (ZOFRAN) 8 MG tablet Take 1 tablet (8 mg total) by mouth every 8 (eight) hours as needed for nausea or vomiting. 60 tablet 0  . ondansetron (ZOFRAN-ODT) 8 MG disintegrating tablet Take 1 tablet (8 mg total) by mouth every 8 (eight) hours as needed for nausea or vomiting. 60 tablet 1  . polyethylene glycol (MIRALAX / GLYCOLAX) 17 g packet Take  17 g by mouth 2 (two) times daily. 100 each 11  . prochlorperazine (COMPAZINE) 10 MG tablet Take 1 tablet (10 mg total) by  mouth every 6 (six) hours as needed (Nausea or vomiting). 30 tablet 1  . promethazine (PHENERGAN) 12.5 MG tablet Take 1 tablet (12.5 mg total) by mouth every 6 (six) hours as needed for nausea or vomiting. 30 tablet 2  . rosuvastatin (CRESTOR) 40 MG tablet Take 40 mg by mouth daily.    Marland Kitchen senna (SENOKOT) 8.6 MG tablet Take 2 tablets (17.2 mg total) by mouth 3 (three) times daily. 90 tablet 1  . ZOLOFT 50 MG tablet Take 1 tablet by mouth as directed  take #1/2 tablet daily x one week then take #1 tablet daily     No current facility-administered medications for this visit.   Facility-Administered Medications Ordered in Other Visits  Medication Dose Route Frequency Provider Last Rate Last Admin  . heparin lock flush 100 unit/mL  500 Units Intracatheter Once [name], MD        ALLERGIES:  Allergies  Allergen Reactions  . Latex Rash and Hives  . Sulfamethoxazole-Trimethoprim Other (See Comments)    Reaction:  Unknown  Other reaction(s): Unknown When 58, dr told her not to take    PHYSICAL EXAM:  Performance status (ECOG): 1 - Symptomatic but completely ambulatory  Vitals:   09/22/20 0836  BP: (!) 128/95  Pulse: 99  Resp: 19  Temp: (!) 96.9 F (36.1 C)  SpO2: 98%   Wt Readings from Last 3 Encounters:  09/22/20 195 lb 11.2 oz (88.8 kg)  09/07/20 204 lb 8 oz (92.8 kg)  08/24/20 201 lb 3.2 oz (91.3 kg)   Physical Exam Vitals reviewed.  Constitutional:      Appearance: Normal appearance.  Cardiovascular:     Rate and Rhythm: Normal rate and regular rhythm.     Pulses: Normal pulses.     Heart sounds: Normal heart sounds.  Pulmonary:     Effort: Pulmonary effort is normal.     Breath sounds: Normal breath sounds.  Chest:     Comments: Port-a-Cath in L chest Abdominal:     Palpations: Abdomen is soft. There is no hepatomegaly or mass.      Tenderness: There is abdominal tenderness in the epigastric area.  Musculoskeletal:     Right lower leg: No edema.     Left lower leg: No edema.     Comments: Erythema & mottling in L lower leg   Neurological:     General: No focal deficit present.     Mental Status: She is alert and oriented to person, place, and time.  Psychiatric:        Mood and Affect: Mood normal.        Behavior: Behavior normal.     LABORATORY DATA:  I have reviewed the labs as listed.  CBC Latest Ref Rng & Units 09/22/2020 09/07/2020 08/24/2020  WBC 4.0 - 10.5 K/uL 6.6 7.5 5.9  Hemoglobin 12.0 - 15.0 g/dL 11.8(L) 10.6(L) 11.3(L)  Hematocrit 36.0 - 46.0 % 37.3 33.7(L) 34.7(L)  Platelets 150 - 400 K/uL 283 314 250   CMP Latest Ref Rng & Units 09/22/2020 09/07/2020 08/24/2020  Glucose 70 - 99 mg/dL 142(H) 147(H) 146(H)  BUN 6 - 20 mg/dL 13 12 27(H)  Creatinine 0.44 - 1.00 mg/dL 0.59 0.79 1.09(H)  Sodium 135 - 145 mmol/L 136 137 132(L)  Potassium 3.5 - 5.1 mmol/L 3.1(L) 3.4(L) 3.0(L)  Chloride 98 - 111 mmol/L 98 100 96(L)  CO2 22 - 32 mmol/L $RemoveB'27 27 23  'pgniQDeE$ Calcium 8.9 - 10.3 mg/dL 9.1 9.0 8.8(L)  Total Protein  6.5 - 8.1 g/dL 7.1 6.7 6.7  Total Bilirubin 0.3 - 1.2 mg/dL 0.6 0.3 0.5  Alkaline Phos 38 - 126 U/L 77 86 98  AST 15 - 41 U/L 23 38 33  ALT 0 - 44 U/L 15 35 44    DIAGNOSTIC IMAGING:  I have independently reviewed the scans and discussed with the patient. CT Chest W Contrast  Result Date: 09/02/2020 CLINICAL DATA:  Metastatic ovarian cancer with peritoneal carcinomatosis, restaging. Prior surgery. Ongoing chemotherapy. EXAM: CT CHEST, ABDOMEN, AND PELVIS WITH CONTRAST TECHNIQUE: Multidetector CT imaging of the chest, abdomen and pelvis was performed following the standard protocol during bolus administration of intravenous contrast. CONTRAST:  134mL OMNIPAQUE IOHEXOL 300 MG/ML  SOLN COMPARISON:  Multiple exams, including 06/24/2020 and 04/03/2020 FINDINGS: CT CHEST FINDINGS Measurements are in comparison to the  04/03/2020 chest CT. Cardiovascular: Right Port-A-Cath tip: SVC Atherosclerotic calcification of the aortic arch. Mediastinum/Nodes: Right hilar lymph node 1.1 cm in short axis, mildly enlarged. Calcified nodules or tumor deposits in the pericardial adipose tissue including a 1.3 by 1.4 cm right anterior pericardial node or nodule on image 43 of series 2, previously 1.4 by 0.9 cm. A calcified a partially calcified left lower paratracheal lymph node measures 1.1 cm in short axis on image 43 series 2, formerly 0.9 cm. Lungs/Pleura: Small but loculated left pleural effusion thick pleural enhancement, pleural nodularity, and pleural calcification on the left. The degree of pleural fluid is increased. The appearance favors malignant left pleural effusion. There is atelectasis of most of the left lower lobe with considerable associated nodular calcifications along the atelectatic left lower lobe. Musculoskeletal: 1.0 by 0.6 cm sclerotic lesion in the sternal manubrium on image 35 series 3, much increased in conspicuity compared to the prior exam, favoring sclerotic reaction to prior osseous metastatic disease. Sometimes increased sclerosis can indicate a healing sclerotic lesion rather than necessarily indicating progression. Nodule laterally in the left breast measures 2.8 by 2.0 cm on image 25 series 2, essentially stable. CT ABDOMEN PELVIS FINDINGS Measurements are in comparison to the 06/24/2020 abdomen CT. Hepatobiliary: Formerly hypodense lesion in the dome of the right hepatic lobe is now mildly hyperdense and measures 1.1 by 0.8 cm on image 45 series 2, previously the same. This could reflect enhancement or progressive calcification. A hypodense lesion in the lateral segment left hepatic lobe measures 2.0 by 1.7 cm on image 55 series 2, formerly 1.8 by 1.3 cm, hence mildly increased in size. The gallbladder is not visualized and presumed surgically absent. Scattered calcified nodules along the hepatic capsule are  similar to the prior exam. Pancreas: Short tail of the pancreas. Spleen: Small calcified nodules along the splenic capsule generally similar to the prior exam. Adrenals/Urinary Tract: Unremarkable Stomach/Bowel: Unremarkable Vascular/Lymphatic: Aortoiliac atherosclerotic vascular disease. Fusiform infrarenal abdominal aortic aneurysm 3.4 cm in diameter on image 74 series 2, stable. Reproductive: Uterus absent.  Ovaries are indistinct. Other: Scattered omental nodularity is specially in the left upper quadrant compatible with tumor deposition. This has a similar distribution to the prior exam although slightly reduced soft tissue elements. For example, the thickness of a partially calcified band of soft tissue in the left upper quadrant omentum is 0.9 cm on image 58 of series 2 and was previously 1.3 cm. Calcified tumor nodule along the falciform ligament is 1.3 by 0.9 cm on image 62 series 2, formerly 1.4 by 1.0 cm. Tumor nodularity just below the pylorus of the stomach appears similar to prior. Continued calcifications along the left  paracolic gutter. Soft tissue density in the right lower quadrant measures 1.7 cm in thickness on image 111 series 2, previously 1.8 cm by my measurements. Musculoskeletal: Stable sclerotic lesion in the left greater trochanter on image 114 series 2. Grade 1 degenerative anterolisthesis at L4-5. Lower lumbar spondylosis and degenerative disc disease. IMPRESSION: 1. Mixed appearance, with some mild reduction in tumor nodularity along the omentum, but with mild increase in size of the hypodense lesion in the lateral segment left hepatic lobe. 2. There have also been some evolutionary findings including increased density of the lesion in the dome of the right hepatic lobe, and increased sclerosis of a sternal manubrial lesion compatible with metastatic disease. The sclerotic left proximal femoral metastatic lesion is stable. 3. Increased malignant left pleural effusion. Atelectasis of  most of the left lower lobe with scattered internal calcifications probably from tumor deposition within or along the atelectatic left lower lobe. 4. Other imaging findings of potential clinical significance: Fusiform infrarenal abdominal aortic aneurysm 3.4 cm in diameter, stable. Stable left breast nodule. Lower lumbar spondylosis and degenerative disc disease with grade 1 degenerative anterolisthesis at L4-5. 5. Aortic atherosclerosis. Aortic Atherosclerosis (ICD10-I70.0). Electronically Signed   By: Gaylyn Rong M.D.   On: 09/02/2020 12:58   CT Abdomen Pelvis W Contrast  Result Date: 09/02/2020 CLINICAL DATA:  Metastatic ovarian cancer with peritoneal carcinomatosis, restaging. Prior surgery. Ongoing chemotherapy. EXAM: CT CHEST, ABDOMEN, AND PELVIS WITH CONTRAST TECHNIQUE: Multidetector CT imaging of the chest, abdomen and pelvis was performed following the standard protocol during bolus administration of intravenous contrast. CONTRAST:  OMNIPAQUE IOHEXOL 300 MG/ML  SOLN COMPARISON:  Multiple exams, including 06/24/2020 and 04/03/2020 FINDINGS: CT CHEST FINDINGS Measurements are in comparison to the 04/03/2020 chest CT. Cardiovascular: Right Port-A-Cath tip: SVC Atherosclerotic calcification of the aortic arch. Mediastinum/Nodes: Right hilar lymph node 1.1 cm in short axis, mildly enlarged. Calcified nodules or tumor deposits in the pericardial adipose tissue including a 1.3 by 1.4 cm right anterior pericardial node or nodule on image 43 of series 2, previously 1.4 by 0.9 cm. A calcified a partially calcified left lower paratracheal lymph node measures 1.1 cm in short axis on image 43 series 2, formerly 0.9 cm. Lungs/Pleura: Small but loculated left pleural effusion thick pleural enhancement, pleural nodularity, and pleural calcification on the left. The degree of pleural fluid is increased. The appearance favors malignant left pleural effusion. There is atelectasis of most of the left lower  lobe with considerable associated nodular calcifications along the atelectatic left lower lobe. Musculoskeletal: 1.0 by 0.6 cm sclerotic lesion in the sternal manubrium on image 35 series 3, much increased in conspicuity compared to the prior exam, favoring sclerotic reaction to prior osseous metastatic disease. Sometimes increased sclerosis can indicate a healing sclerotic lesion rather than necessarily indicating progression. Nodule laterally in the left breast measures 2.8 by 2.0 cm on image 25 series 2, essentially stable. CT ABDOMEN PELVIS FINDINGS Measurements are in comparison to the 06/24/2020 abdomen CT. Hepatobiliary: Formerly hypodense lesion in the dome of the right hepatic lobe is now mildly hyperdense and measures 1.1 by 0.8 cm on image 45 series 2, previously the same. This could reflect enhancement or progressive calcification. A hypodense lesion in the lateral segment left hepatic lobe measures 2.0 by 1.7 cm on image 55 series 2, formerly 1.8 by 1.3 cm, hence mildly increased in size. The gallbladder is not visualized and presumed surgically absent. Scattered calcified nodules along the hepatic capsule are similar to the prior exam.  Pancreas: Short tail of the pancreas. Spleen: Small calcified nodules along the splenic capsule generally similar to the prior exam. Adrenals/Urinary Tract: Unremarkable Stomach/Bowel: Unremarkable Vascular/Lymphatic: Aortoiliac atherosclerotic vascular disease. Fusiform infrarenal abdominal aortic aneurysm 3.4 cm in diameter on image 74 series 2, stable. Reproductive: Uterus absent.  Ovaries are indistinct. Other: Scattered omental nodularity is specially in the left upper quadrant compatible with tumor deposition. This has a similar distribution to the prior exam although slightly reduced soft tissue elements. For example, the thickness of a partially calcified band of soft tissue in the left upper quadrant omentum is 0.9 cm on image 58 of series 2 and was previously  1.3 cm. Calcified tumor nodule along the falciform ligament is 1.3 by 0.9 cm on image 62 series 2, formerly 1.4 by 1.0 cm. Tumor nodularity just below the pylorus of the stomach appears similar to prior. Continued calcifications along the left paracolic gutter. Soft tissue density in the right lower quadrant measures 1.7 cm in thickness on image 111 series 2, previously 1.8 cm by my measurements. Musculoskeletal: Stable sclerotic lesion in the left greater trochanter on image 114 series 2. Grade 1 degenerative anterolisthesis at L4-5. Lower lumbar spondylosis and degenerative disc disease. IMPRESSION: 1. Mixed appearance, with some mild reduction in tumor nodularity along the omentum, but with mild increase in size of the hypodense lesion in the lateral segment left hepatic lobe. 2. There have also been some evolutionary findings including increased density of the lesion in the dome of the right hepatic lobe, and increased sclerosis of a sternal manubrial lesion compatible with metastatic disease. The sclerotic left proximal femoral metastatic lesion is stable. 3. Increased malignant left pleural effusion. Atelectasis of most of the left lower lobe with scattered internal calcifications probably from tumor deposition within or along the atelectatic left lower lobe. 4. Other imaging findings of potential clinical significance: Fusiform infrarenal abdominal aortic aneurysm 3.4 cm in diameter, stable. Stable left breast nodule. Lower lumbar spondylosis and degenerative disc disease with grade 1 degenerative anterolisthesis at L4-5. 5. Aortic atherosclerosis. Aortic Atherosclerosis (ICD10-I70.0). Electronically Signed   By: Van Clines M.D.   On: 09/02/2020 12:58     ASSESSMENT:  1.Stage IVa low-grade ovarian cancer: -05/05/2016-exploratory laparotomy, posterior exenteration, argon beam ablation of peritoneum, diaphragm stripping, cholecystectomy, omentectomy, appendectomy and optimal tumor debulking by  Dr. Ovidio Hanger. -06/24/2016: Started Taxol/carboplatin. 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 Taxol discontinued cycle 5. -10/24/2016: Started maintenance letrozole. -March 2020bevacizumab started andcompleted22 cycles until 12/02/2019, discontinued due to TIA and uncontrolled hypertension. -Germline mutation testing reportedly done at [facility] was negative. -She is under the care of Dr. Merla Riches July 2021 -CTAP on 04/03/2020 with stable appearance of calcified omental and mesenteric nodules. Soft tissue fullness in the right adnexal region is stable. -MRI of the left hip on 04/23/2020 with 18 mm bone lesion in the peripheral superior aspect of the left greater trochanter -XRT to left femur lesion, 30 Gray from 05/06/2020 through 05/20/2020. -CTAP on 06/24/2020 with overall progression of calcified and noncalcified peritoneal surface and omental lesions with slight progression of disease at the left lung base involving the pleura. Stable sclerotic bone lesion involving the left greater trochanter. -3 cycles of carboplatin and gemcitabine from 07/03/2020 through 08/17/2020. -CT CAP on 09/02/2020 showed right hilar lymph node slightly increased in size.  Calcified left lower paratracheal lymph node also slightly increased in size.  CT of the chest was compared to prior CT scan from 04/03/2020.  CTAP showed hypodense lesion in  the dome of the right hepatic lobe is mildly hyperdense measuring 1.1 x 0.8 cm, previously the same.  Hypodense lesion in the lateral segment of the left hepatic lobe measures 2 x 1.7 cm, formerly 1.8 x 1.3 cm. -CA-125 has steadily improved.  2. Social/family history: -Lives by herself. Worked as a Land. 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 Quit smoking 2 years ago.  3. Abdominal aneurysm: -CTAP on 06/24/2020 shows stable 3.4 x 3.3 cm infrarenal abdominal aortic aneurysm. -Used to follow with vascular surgery at [facility].   PLAN:   1.Stage IVa low-grade ovarian cancer: -She had leg swelling and pain while on gemcitabine and carboplatin.  The most likely caused by gemcitabine component. -I have recommended restarting single agent carboplatin and see how she tolerates it. -We have also talked about other treatment options including capecitabine, vinorelbine, Irinotecan and pemetrexed.  The first 2 are likely to worsen her neuropathy. -We will also consider checking MMR/MSI testing and NGS testing. -We have reviewed her labs today which showed normal LFTs and renal function.  CBC was normal. -Proceed with carboplatin AUC 4.  If she tolerates well, will increase to AUC 5 at next time.  2. Back pain/neuropathy: -Continue MS Contin 30 mg every 8 hours. -Continue Dilaudid 4 mg every 4 hours for breakthrough pain.  Continue gabapentin 900 mg 3 times daily.  3. Hypertension: -Continue lisinopril/HCTZ daily.  4. Nausea/vomiting: -Continue Phenergan and Zofran as needed.  5. Lower extremity swelling: -This is predominantly in the left leg.  Doppler in [place] [facility] was negative for DVT. -There is mottling on the left leg.  I have recommended compression stockings. -I have also recommended evaluation by vascular. -She will use Lasix as needed.  There is not any swelling today.  6.  Hypokalemia: -Potassium today is 3.1.  It has been consistently low for the last 3 times. -We will start her on potassium 10 mEq 2 tablets daily.   Orders placed this encounter:  No orders of the defined types were placed in this encounter.    Derek Jack, MD [facility] [phone]   I, Milinda Antis, am acting as a scribe for Dr. Sanda Linger.  I, Derek Jack MD, have reviewed the above documentation for accuracy and completeness, and I agree with the above.

## 2020-09-22 NOTE — Progress Notes (Signed)
Patients port flushed without difficulty.  Good blood return noted with no bruising or swelling noted at site.  Patient stable during access and blood draw.  Vital signs stable.  Patient left accessed for treatment.

## 2020-09-22 NOTE — Patient Instructions (Addendum)
[place] at [facility] Discharge Instructions  You were seen today by Dr. Delton Coombes. He went over your recent results. You received your treatment today, only the carboplatin without the gemcitabine. Make an appointment with your vascular doctor to solve the issues with your legs. Purchase compression stockings and apply on your legs to see if your pain improves. You will be prescribed potassium 10 mEq to take 2 tablets daily. Dr. Delton Coombes will see you back in 3 weeks for labs and follow up.   Thank you for choosing [place] at [facility] to provide your oncology and hematology care.  To afford each patient quality time with our provider, please arrive at least 15 minutes before your scheduled appointment time.   If you have a lab appointment with the [facility] please come in thru the Main Entrance and check in at the main information desk  You need to re-schedule your appointment should you arrive 10 or more minutes late.  We strive to give you quality time with our providers, and arriving late affects you and other patients whose appointments are after yours.  Also, if you no show three or more times for appointments you may be dismissed from the clinic at the providers discretion.     Again, thank you for choosing [facility].  Our hope is that these requests will decrease the amount of time that you wait before being seen by our physicians.       _____________________________________________________________  Should you have questions after your visit to [facility], please contact our office at [phone] between the hours of 8:00 a.m. and 4:30 p.m.  Voicemails left after 4:00 p.m. will not be returned until the following business day.  For prescription refill requests, have your pharmacy contact our office and allow 72 hours.    Cancer Center Support Programs:   > Cancer Support Group  2nd Tuesday of the  month 1pm-2pm, Journey Room

## 2020-09-23 LAB — CA 125: Cancer Antigen (CA) 125: 210 U/mL — ABNORMAL HIGH (ref 0.0–38.1)

## 2020-09-29 ENCOUNTER — Ambulatory Visit (HOSPITAL_COMMUNITY): Payer: Medicare Other

## 2020-09-29 ENCOUNTER — Other Ambulatory Visit (HOSPITAL_COMMUNITY): Payer: Medicare Other

## 2020-10-06 ENCOUNTER — Other Ambulatory Visit (HOSPITAL_COMMUNITY): Payer: Self-pay

## 2020-10-06 DIAGNOSIS — C569 Malignant neoplasm of unspecified ovary: Secondary | ICD-10-CM

## 2020-10-06 DIAGNOSIS — C786 Secondary malignant neoplasm of retroperitoneum and peritoneum: Secondary | ICD-10-CM

## 2020-10-06 MED ORDER — MORPHINE SULFATE ER 30 MG PO TBCR: 30.0000 mg | EXTENDED_RELEASE_TABLET | Freq: Three times a day (TID) | ORAL | 0 refills | Status: DC

## 2020-10-09 ENCOUNTER — Other Ambulatory Visit (HOSPITAL_COMMUNITY): Payer: Self-pay | Admitting: *Deleted

## 2020-10-09 DIAGNOSIS — C569 Malignant neoplasm of unspecified ovary: Secondary | ICD-10-CM

## 2020-10-09 MED ORDER — HYDROMORPHONE HCL 4 MG PO TABS: 4.0000 mg | ORAL_TABLET | ORAL | 0 refills | Status: DC | PRN

## 2020-10-12 ENCOUNTER — Other Ambulatory Visit (HOSPITAL_COMMUNITY): Payer: Self-pay | Admitting: *Deleted

## 2020-10-12 DIAGNOSIS — C569 Malignant neoplasm of unspecified ovary: Secondary | ICD-10-CM

## 2020-10-12 MED ORDER — HYDROMORPHONE HCL 4 MG PO TABS
4.0000 mg | ORAL_TABLET | ORAL | 0 refills | Status: DC | PRN
Start: 2020-10-12 — End: 2020-11-09

## 2020-10-13 ENCOUNTER — Other Ambulatory Visit: Payer: Self-pay

## 2020-10-13 ENCOUNTER — Other Ambulatory Visit (HOSPITAL_COMMUNITY): Payer: Self-pay | Admitting: *Deleted

## 2020-10-13 ENCOUNTER — Inpatient Hospital Stay (HOSPITAL_COMMUNITY): Payer: Medicare Other

## 2020-10-13 ENCOUNTER — Encounter (HOSPITAL_COMMUNITY): Payer: Self-pay | Admitting: Hematology

## 2020-10-13 ENCOUNTER — Inpatient Hospital Stay (HOSPITAL_BASED_OUTPATIENT_CLINIC_OR_DEPARTMENT_OTHER): Payer: Medicare Other | Admitting: Hematology

## 2020-10-13 ENCOUNTER — Encounter (HOSPITAL_COMMUNITY): Payer: Self-pay

## 2020-10-13 VITALS — BP 136/45 | HR 62 | Temp 97.0°F | Resp 18

## 2020-10-13 VITALS — BP 145/98 | HR 89 | Temp 97.0°F | Resp 18 | Wt 190.5 lb

## 2020-10-13 DIAGNOSIS — C786 Secondary malignant neoplasm of retroperitoneum and peritoneum: Secondary | ICD-10-CM

## 2020-10-13 DIAGNOSIS — G620 Drug-induced polyneuropathy: Secondary | ICD-10-CM

## 2020-10-13 DIAGNOSIS — Z7189 Other specified counseling: Secondary | ICD-10-CM

## 2020-10-13 DIAGNOSIS — T451X5A Adverse effect of antineoplastic and immunosuppressive drugs, initial encounter: Secondary | ICD-10-CM

## 2020-10-13 DIAGNOSIS — C569 Malignant neoplasm of unspecified ovary: Secondary | ICD-10-CM

## 2020-10-13 DIAGNOSIS — Z5111 Encounter for antineoplastic chemotherapy: Secondary | ICD-10-CM | POA: Diagnosis not present

## 2020-10-13 LAB — COMPREHENSIVE METABOLIC PANEL
ALT: 26 U/L (ref 0–44)
AST: 36 U/L (ref 15–41)
Albumin: 3.9 g/dL (ref 3.5–5.0)
Alkaline Phosphatase: 82 U/L (ref 38–126)
Anion gap: 10 (ref 5–15)
BUN: 11 mg/dL (ref 6–20)
CO2: 27 mmol/L (ref 22–32)
Calcium: 9.3 mg/dL (ref 8.9–10.3)
Chloride: 100 mmol/L (ref 98–111)
Creatinine, Ser: 0.7 mg/dL (ref 0.44–1.00)
GFR, Estimated: >60 mL/min (ref >60)
Glucose, Bld: 109 mg/dL — ABNORMAL HIGH (ref 70–99)
Potassium: 3.3 mmol/L — ABNORMAL LOW (ref 3.5–5.1)
Sodium: 137 mmol/L (ref 135–145)
Total Bilirubin: 0.7 mg/dL (ref 0.3–1.2)
Total Protein: 6.8 g/dL (ref 6.5–8.1)

## 2020-10-13 LAB — CBC WITH DIFFERENTIAL/PLATELET
Abs Immature Granulocytes: 0.01 10*3/uL (ref 0.00–0.07)
Basophils Absolute: 0 10*3/uL (ref 0.0–0.1)
Basophils Relative: 1 %
Eosinophils Absolute: 0.1 10*3/uL (ref 0.0–0.5)
Eosinophils Relative: 3 %
HCT: 40.1 % (ref 36.0–46.0)
Hemoglobin: 12.9 g/dL (ref 12.0–15.0)
Immature Granulocytes: 0 %
Lymphocytes Relative: 40 %
Lymphs Abs: 2.1 10*3/uL (ref 0.7–4.0)
MCH: 30.2 pg (ref 26.0–34.0)
MCHC: 32.2 g/dL (ref 30.0–36.0)
MCV: 93.9 fL (ref 80.0–100.0)
Monocytes Absolute: 0.6 10*3/uL (ref 0.1–1.0)
Monocytes Relative: 10 %
Neutro Abs: 2.5 10*3/uL (ref 1.7–7.7)
Neutrophils Relative %: 46 %
Platelets: 190 10*3/uL (ref 150–400)
RBC: 4.27 MIL/uL (ref 3.87–5.11)
RDW: 17.4 % — ABNORMAL HIGH (ref 11.5–15.5)
WBC: 5.4 10*3/uL (ref 4.0–10.5)
nRBC: 0 % (ref 0.0–0.2)

## 2020-10-13 MED ORDER — SODIUM CHLORIDE 0.9% FLUSH
10.0000 mL | INTRAVENOUS | Status: DC | PRN
Start: 2020-10-13 — End: 2020-10-13
  Administered 2020-10-13: 10 mL

## 2020-10-13 MED ORDER — DIPHENHYDRAMINE HCL 50 MG/ML IJ SOLN
50.0000 mg | Freq: Once | INTRAMUSCULAR | Status: AC
  Administered 2020-10-13: 50 mg via INTRAVENOUS
  Filled 2020-10-13: qty 1

## 2020-10-13 MED ORDER — PROCHLORPERAZINE EDISYLATE 10 MG/2ML IJ SOLN: 10.0000 mg | INTRAMUSCULAR | Status: DC | PRN

## 2020-10-13 MED ORDER — DRONABINOL 5 MG PO CAPS: 5.0000 mg | ORAL_CAPSULE | Freq: Two times a day (BID) | ORAL | 1 refills | Status: DC

## 2020-10-13 MED ORDER — PALONOSETRON HCL INJECTION 0.25 MG/5ML
0.2500 mg | Freq: Once | INTRAVENOUS | Status: AC
  Administered 2020-10-13: 0.25 mg via INTRAVENOUS
  Filled 2020-10-13: qty 5

## 2020-10-13 MED ORDER — SODIUM CHLORIDE 0.9 % IV SOLN: Freq: Once | INTRAVENOUS | Status: AC

## 2020-10-13 MED ORDER — SODIUM CHLORIDE 0.9 % IV SOLN
10.0000 mg | Freq: Once | INTRAVENOUS | Status: AC
  Administered 2020-10-13: 10 mg via INTRAVENOUS
  Filled 2020-10-13: qty 10

## 2020-10-13 MED ORDER — FAMOTIDINE IN NACL 20-0.9 MG/50ML-% IV SOLN
20.0000 mg | Freq: Once | INTRAVENOUS | Status: AC
  Administered 2020-10-13: 20 mg via INTRAVENOUS
  Filled 2020-10-13: qty 50

## 2020-10-13 MED ORDER — HEPARIN SOD (PORK) LOCK FLUSH 100 UNIT/ML IV SOLN
500.0000 [IU] | Freq: Once | INTRAVENOUS | Status: AC | PRN
  Administered 2020-10-13: 500 [IU]

## 2020-10-13 MED ORDER — MARINOL 5 MG PO CAPS: 5.0000 mg | ORAL_CAPSULE | Freq: Two times a day (BID) | ORAL | 3 refills | Status: DC

## 2020-10-13 MED ORDER — SODIUM CHLORIDE 0.9 % IV SOLN
150.0000 mg | Freq: Once | INTRAVENOUS | Status: AC
  Administered 2020-10-13: 150 mg via INTRAVENOUS
  Filled 2020-10-13: qty 150

## 2020-10-13 MED ORDER — SODIUM CHLORIDE 0.9 % IV SOLN
600.0000 mg | Freq: Once | INTRAVENOUS | Status: AC
  Administered 2020-10-13: 600 mg via INTRAVENOUS
  Filled 2020-10-13: qty 60

## 2020-10-13 NOTE — Progress Notes (Signed)
Patient presents today for treatment and follow up visit with Dr. Delton Coombes. Labs pending. Vital signs within parameters for treatment.   Message received from [place] / Dr. Delton Coombes. Proceed with treatment pending labs.   Labs within parameters for treatment. Per Dr. Delton Coombes message. Carboplatin only today. May give patient Compazine 10 mg IV if nausea occurs during treatment per verbal order Dr. Delton Coombes.   12:09 pm Patient called out after taking a nap and states, " I feel like I can't catch my breath. " Upon entering room patient sitting up and no signs of respiratory distress noted. Vital signs within normal limits and stable. Patient's 02 at 99 % . See Flowsheet for vital signs.   Treatment given today per MD orders. Tolerated infusion without adverse affects. Vital signs stable. No complaints at this time. Discharged from clinic via wheel chair in stable condition. Alert and oriented x 3. F/U with [facility] as scheduled.

## 2020-10-13 NOTE — Patient Instructions (Addendum)
[place] at [facility] Discharge Instructions  You were seen today by Dr. Delton Coombes. He went over your recent results and scans. You received your treatment today; your next treatment will be next week. You will be scheduled to have an MRI of your left hip and lumbar spine prior to your next visit. Make sure to visit the vascular surgeon for your left leg. Take Marinol twice daily to improve your appetite and energy levels. Dr. Delton Coombes will see you back in 3 weeks for labs and follow up.   Thank you for choosing [place] at [facility] to provide your oncology and hematology care.  To afford each patient quality time with our provider, please arrive at least 15 minutes before your scheduled appointment time.   If you have a lab appointment with the [facility] please come in thru the Main Entrance and check in at the main information desk  You need to re-schedule your appointment should you arrive 10 or more minutes late.  We strive to give you quality time with our providers, and arriving late affects you and other patients whose appointments are after yours.  Also, if you no show three or more times for appointments you may be dismissed from the clinic at the providers discretion.     Again, thank you for choosing [facility].  Our hope is that these requests will decrease the amount of time that you wait before being seen by our physicians.       _____________________________________________________________  Should you have questions after your visit to [facility], please contact our office at [phone] between the hours of 8:00 a.m. and 4:30 p.m.  Voicemails left after 4:00 p.m. will not be returned until the following business day.  For prescription refill requests, have your pharmacy contact our office and allow 72 hours.    Cancer Center Support Programs:   > Cancer Support Group  2nd Tuesday of the  month 1pm-2pm, Journey Room

## 2020-10-13 NOTE — Progress Notes (Signed)
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 Exploratory laparotomy, posterior exenteration, argon beam ablation of peritoneum, omentectomy and optimal tumor debulking on 05/05/2016. 2. Carboplatin and paclitaxel x 5 cycles from 06/24/2016. 3. Avastin x 22 cycles through 12/02/2019 discontinued due to TIA and hypertension. 4. Left femur radiation 30 Gy in 10 fractions from 05/07/2020 to 05/20/2020.  NGS Results: Not done  CURRENT THERAPY: Carboplatin, gemcitabine and Aloxi every 3 weeks  BRIEF ONCOLOGIC HISTORY:  Oncology History Overview Note  Negative genetics at [facility]   Ovarian cancer ([place])  04/25/2016 Imaging   CT confirms 12cm ovarian masses with extensive peritoneal metastases and malignant pleural effusions   05/05/2016 Surgery   s/p ex-lap posterior exenteration, diaphragm stripping, cholecystectomy, omentectomy, appendectomy and optimal tumor debulking on 05/05/16   06/24/2016 - 10/24/2016 Chemotherapy   Started Taxol/Carboplatin. 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  Numerous left predominant calcified pleural metastases and associated small left pleural effusion are unchanged.  2.  Unchanged (3.0 cm) (series 3, image 96) left breast mass    02/07/2019 Imaging   Outside Ct abdomen and pelvis 1. Overall stable small volume peritoneal tumor, as  described above.  2. Unchanged infrarenal abdominal aortic aneurysm.    02/22/2019 Tumor Marker   Patient's tumor was tested for the following markers: CA-125 Results of the tumor marker test revealed 164   03/18/2019 Tumor Marker   Patient's tumor was tested for the following markers: CA-125 Results of the tumor marker test revealed 162   04/08/2019 Tumor Marker   Patient's tumor was tested for the following markers: CA-125 Results of the tumor marker test revealed 164   04/29/2019 Tumor Marker   Patient's tumor was tested for the following markers: CA-125 Results of the tumor marker test revealed 181   05/20/2019 Tumor Marker   Patient's tumor was tested for the following markers: CA-125 Results of the tumor marker test revealed 164   06/10/2019 Tumor Marker   Patient's tumor was tested for the following markers: CA-125 Results of the tumor marker test revealed 152   07/01/2019 Tumor Marker   Patient's tumor was tested for the following markers: CA-125 Results of the tumor marker test revealed 160   07/22/2019 Tumor Marker   Patient's tumor was tested for the following markers: CA-125 Results of the tumor marker test revealed 171   08/12/2019 Tumor Marker   Patient's tumor was tested for the following markers: CA-125 Results of the tumor marker test revealed 173   09/02/2019 Tumor Marker   Patient's tumor was tested for the following markers: CA-125 Results of the tumor marker test revealed 159   09/30/2019 Tumor Marker   Patient's tumor was tested for the following markers: CA-125 Results of the tumor marker test revealed 168   11/08/2019 Imaging   Outside CT imaging 1. Study was performed at outside institution on 11/08/2019  and presented for review on 11/14/2019.  2. Stable calcified and noncalcified left-sided nodular and plaque-like pleural abnormalities with small chronic left pleural effusion compatible with stable treated pleural metastasis. No new or enlarging lesions.   3. No enlarging thoracic lymph nodes.  4. Redemonstration of left breast mass. Correlation with mammography is suggested if not already performed   12/02/2019 Tumor Marker   Patient's tumor was tested for the following markers: CA-125 Results of the tumor marker test revealed 186   12/18/2019 Cancer Staging   Staging form: Ovary, Fallopian Tube, and Primary Peritoneal Carcinoma, AJCC 8th Edition - Clinical stage from 12/18/2019: FIGO Stage IV (rcT3c, cN0, cM1) - Signed by Artis DelayGorsuch, Ni, MD on 04/24/2020   01/09/2020 Tumor Marker   Patient's tumor was tested for the following markers: CA-125 Results of the tumor marker test revealed 184   01/10/2020 Imaging   1. Treated left pleural and peritoneal metastatic disease. Areas of residual soft tissue prominence in the right anatomic pelvis are indeterminate. Comparison with prior exams would be helpful. 2. Hepatomegaly. Low-attenuation lesions in the liver are too small to characterize. Comparison with prior exams would be helpful. 3. Small left fibrothorax. 4. Infrarenal Aortic aneurysm NOS (ICD10-I71.9). Recommend followup by ultrasound in 2 years.  5. Aortic atherosclerosis (ICD10-I70.0). Coronary artery calcification.   03/09/2020 Tumor Marker   Patient's tumor was tested for the following markers: CA-125 Results of the tumor marker test revealed 214   04/01/2020 Tumor Marker   Patient's tumor was tested for the following markers: CA-125 Results of the tumor marker test revealed 245.   04/03/2020 Imaging   1. No acute findings in the abdomen or pelvis. Specifically, no findings to explain the patient's history of left upper quadrant pain with nausea and vomiting. 2. Stable appearance of calcified omental and mesenteric nodules consistent with treated metastatic disease. Soft tissue fullness seen in the right adnexal region on the prior exam is similar today. 3. Stable 1.6 x 1.2 cm low-density lesion lateral segment left liver. 4. 3.5 cm  infrarenal abdominal aortic aneurysm. Recommend follow-up ultrasound every 2 years. This recommendation follows ACR consensus guidelines: White Paper of the ACR Incidental Findings Committee II on Vascular Findings. J Am Coll Radiol 2013; 16:109-604; 10:789-794. 5. Aortic Atherosclerosis (ICD10-I70.0).   04/15/2020 Imaging   Focus of abnormal uptake is seen involving the greater trochanter of the proximal left femur which corresponds to sclerotic density seen on prior CT scan and is concerning for metastatic disease.   04/23/2020 Imaging   1. No hip fracture, dislocation or avascular necrosis. 2. A 18 mm bone lesion in the peripheral superior aspect of the left greater trochanter with enhancement on postcontrast imaging. The overall appearance is most concerning for metastatic disease in the setting of known malignancy. 3. 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 4. Degenerative disease with disc height loss at L4-5 and L5-S1 with bilateral facet arthropathy.   06/08/2020 Tumor Marker   Patient's tumor was tested for the following markers: CA-125. Results of the tumor marker test revealed 272.   06/24/2020 Imaging   1. Overall progression of calcified and noncalcified peritoneal surface and omental lesions. 2. Slight progression of disease at the left lung base and involving the pleura. 3. Stable sclerotic bone lesion involving the left greater trochanter. 4. Stable infrarenal abdominal aortic aneurysm.   07/03/2020 -  Chemotherapy    Patient is on Treatment Plan: OVARIAN RECURRENT 3RD LINE CARBOPLATIN D1 / GEMCITABINE D1,8 (4/800) Q21D      07/03/2020  Tumor Marker   Patient's tumor was tested for the following markers: CA-125 Results of the tumor marker test revealed 285.   07/10/2020 Tumor Marker   Patient's tumor was tested for the following markers: CA-125 Results of the tumor marker test revealed 332   07/23/2020 Tumor Marker   Patient's tumor was tested for the following markers:  CA-125 Results of the tumor marker test revealed 308     CANCER STAGING: Cancer Staging Ovarian cancer [facility]) Staging form: Ovary, Fallopian Tube, and Primary Peritoneal Carcinoma, AJCC 8th Edition - Clinical stage from 12/18/2019: FIGO Stage IV (rcT3c, cN0, cM1) - Signed by [name], MD on 04/24/2020   INTERVAL HISTORY:  Ms. Heidi Johnson, a 50 y.o. female, returns for routine follow-up and consideration for next cycle of chemotherapy. Melvinia was last seen on 09/22/2020.  Due for cycle #5 of carboplatin, gemcitabine and Aloxi today.   Today she is accompanied by her daughter. Overall, she tells me she has been feeling fair. She continues having erythema and burning pain on her left lower leg from above the knee and down, along with pain and weakness in her left hip and femur and along anterior left tibia; she denies having pain in right hip, though she has occasional leg swelling. She is taking gabapentin 900 mg TID and it makes her drowsy. She uses a cane to walk but still need a wheelchair to move around the house. The bone pain has been there since finishing radiation on 05/20/2020 and she wakes up every hour at night. She has a bedside commode near her room and has a small fridge in her room. She has fallen several times in her home. She has numbness in her left foot but not in the right foot. She has not seen the vascular surgeon yet. Her appetite is decreased and she has lost weight; she is drinking 2 cans of Ensure and eats 1 meal per day and does not snack. She has Marinol 5 mg but has not been taking it regularly. She denies having any N/V since last chemo, though she reports having some vomiting today.  Overall, she feels ready for next cycle of chemo today.    REVIEW OF SYSTEMS:  Review of Systems  Constitutional: Positive for appetite change (25%), fatigue (depleted) and unexpected weight change (lost 5 lbs in 3 weeks).  Respiratory: Positive for shortness of breath (w/  exertion).   Cardiovascular: Negative for leg swelling.  Gastrointestinal: Positive for diarrhea (intermittent), nausea and vomiting.  Musculoskeletal: Positive for arthralgias (7/10 dull pain in L hip & sharp pain in anterior L tibia) and gait problem (d/t L leg pain & numbness).  Skin:       Redness on L lower leg  Neurological: Positive for dizziness, gait problem (d/t L leg pain & numbness), headaches and numbness (burning pain from L knee to toes; L foot).  Psychiatric/Behavioral: Positive for sleep disturbance (d/t L leg pain). The patient is nervous/anxious.   All other systems reviewed and are negative.   PAST MEDICAL/SURGICAL HISTORY:  Past Medical History:  Diagnosis Date  . Cancer ([place])    ovarian cancer  . CHF (congestive heart failure) ([place])   . Hypertension   . Panic attacks   . Stroke [facility])    Past Surgical History:  Procedure Laterality Date  . ABDOMINAL HYSTERECTOMY    . BACK SURGERY    . TUBAL LIGATION      SOCIAL HISTORY:  Social History   Socioeconomic History  .  Marital status: Divorced    Spouse name: Not on file  . Number of children: 3  . Years of education: Not on file  . Highest education level: Not on file  Occupational History  . Occupation: retired  Tobacco Use  . Smoking status: Former Smoker    Types: Cigarettes    Quit date: 06/20/2018    Years since quitting: 2.3  . Smokeless tobacco: Never Used  Substance and Sexual Activity  . Alcohol use: No  . Drug use: Yes    Types: Marijuana  . Sexual activity: Yes  Other Topics Concern  . Not on file  Social History Narrative   Lives alone   Social Determinants of Health   Financial Resource Strain: High Risk  . Difficulty of Paying Living Expenses: Hard  Food Insecurity: No Food Insecurity  . Worried About Charity fundraiser in the Last Year: Never true  . Ran Out of Food in the Last Year: Never true  Transportation Needs: No Transportation Needs  . Lack of Transportation  (Medical): No  . Lack of Transportation (Non-Medical): No  Physical Activity: Inactive  . Days of Exercise per Week: 0 days  . Minutes of Exercise per Session: 0 min  Stress: No Stress Concern Present  . Feeling of Stress : Only a little  Social Connections: Moderately Isolated  . Frequency of Communication with Friends and Family: Twice a week  . Frequency of Social Gatherings with Friends and Family: Twice a week  . Attends Religious Services: 1 to 4 times per year  . Active Member of Clubs or Organizations: No  . Attends Archivist Meetings: Never  . Marital Status: Divorced  Human resources officer Violence: Not At Risk  . Fear of Current or Ex-Partner: No  . Emotionally Abused: No  . Physically Abused: No  . Sexually Abused: No    FAMILY HISTORY:  Family History  Problem Relation Age of Onset  . Cancer Maternal Aunt        breast ca/GYN    CURRENT MEDICATIONS:  Current Outpatient Medications  Medication Sig Dispense Refill  . albuterol (VENTOLIN HFA) 108 (90 Base) MCG/ACT inhaler Inhale 2 puffs into the lungs as needed. 2 puffs into lungs every 6 hours as needed for wheezing or SOB    . aspirin 325 MG EC tablet Take 325 mg by mouth daily.    . clonazePAM (KLONOPIN) 1 MG tablet Take 1 mg by mouth 3 (three) times daily as needed for anxiety.    . cyclobenzaprine (FLEXERIL) 5 MG tablet Take 1 tablet by mouth 3 (three) times daily as needed.    . docusate sodium (COLACE) 100 MG capsule Take 100 mg by mouth 2 (two) times daily.    . ergocalciferol (VITAMIN D2) 1.25 MG (50000 UT) capsule Take 1 capsule (50,000 Units total) by mouth once a week. 12 capsule 1  . furosemide (LASIX) 20 MG tablet TAKE 2 TABLETS (40 MG TOTAL) BY MOUTH DAILY AS NEEDED FOR FLUID OR EDEMA. 60 tablet 1  . gabapentin (NEURONTIN) 300 MG capsule Take 3 capsules (900 mg total) by mouth 3 (three) times daily. 270 capsule 4  . hydrochlorothiazide (HYDRODIURIL) 25 MG tablet Take 25 mg by mouth daily.    Marland Kitchen  HYDROmorphone (DILAUDID) 4 MG tablet Take 1 tablet (4 mg total) by mouth every 4 (four) hours as needed for severe pain. 180 tablet 0  . ibuprofen (ADVIL) 600 MG tablet Take 600 mg by mouth every 6 (six) hours  as needed.    Marland Kitchen lisinopril-hydrochlorothiazide (ZESTORETIC) 20-12.5 MG tablet Take 1 tablet by mouth 2 (two) times daily.    Marland Kitchen MARINOL 5 MG capsule Take 5 mg by mouth 3 (three) times daily.    Marland Kitchen morphine (MS CONTIN) 30 MG 12 hr tablet Take 1 tablet (30 mg total) by mouth every 8 (eight) hours. 90 tablet 0  . naloxone (NARCAN) 2 MG/2ML injection Place 2 mg into the nose as needed.     Marland Kitchen OLANZapine (ZYPREXA) 10 MG tablet Take 10 mg by mouth at bedtime.    . ondansetron (ZOFRAN) 8 MG tablet Take 1 tablet (8 mg total) by mouth every 8 (eight) hours as needed for nausea or vomiting. 60 tablet 0  . ondansetron (ZOFRAN-ODT) 8 MG disintegrating tablet Take 1 tablet (8 mg total) by mouth every 8 (eight) hours as needed for nausea or vomiting. (Patient not taking: Reported on 10/13/2020) 60 tablet 1  . polyethylene glycol (MIRALAX / GLYCOLAX) 17 g packet Take 17 g by mouth 2 (two) times daily. 100 each 11  . potassium chloride (KLOR-CON M10) 10 MEQ tablet Take 2 tablets (20 mEq total) by mouth daily. 60 tablet 2  . prochlorperazine (COMPAZINE) 10 MG tablet Take 1 tablet (10 mg total) by mouth every 6 (six) hours as needed (Nausea or vomiting). 30 tablet 1  . promethazine (PHENERGAN) 12.5 MG tablet Take 1 tablet (12.5 mg total) by mouth every 6 (six) hours as needed for nausea or vomiting. 30 tablet 2  . rosuvastatin (CRESTOR) 40 MG tablet Take 40 mg by mouth daily.    Marland Kitchen senna (SENOKOT) 8.6 MG tablet Take 2 tablets (17.2 mg total) by mouth 3 (three) times daily. 90 tablet 1  . ZOLOFT 50 MG tablet Take 1 tablet by mouth as directed  take #1/2 tablet daily x one week then take #1 tablet daily     No current facility-administered medications for this visit.   Facility-Administered Medications Ordered in  Other Visits  Medication Dose Route Frequency Provider Last Rate Last Admin  . heparin lock flush 100 unit/mL  500 Units Intracatheter Once [name], MD        ALLERGIES:  Allergies  Allergen Reactions  . Latex Rash and Hives  . Sulfamethoxazole-Trimethoprim Other (See Comments)    Reaction:  Unknown  Other reaction(s): Unknown When 3, dr told her not to take    PHYSICAL EXAM:  Performance status (ECOG): 1 - Symptomatic but completely ambulatory  Vitals:   10/13/20 0850  BP: (!) 145/98  Pulse: 89  Resp: 18  Temp: (!) 97 F (36.1 C)  SpO2: 97%   Wt Readings from Last 3 Encounters:  10/13/20 190 lb 8 oz (86.4 kg)  09/22/20 195 lb 11.2 oz (88.8 kg)  09/07/20 204 lb 8 oz (92.8 kg)   Physical Exam Vitals reviewed.  Constitutional:      Appearance: Normal appearance.  Cardiovascular:     Rate and Rhythm: Normal rate and regular rhythm.     Pulses: Normal pulses.     Heart sounds: Normal heart sounds.  Pulmonary:     Effort: Pulmonary effort is normal.     Breath sounds: Normal breath sounds.  Chest:     Comments: Port-a-Cath in L chest Musculoskeletal:     Right lower leg: No edema.     Left lower leg: Edema (after walking) present.  Skin:    Findings: Erythema (blanchable erythema on L leg from above knee to toes) present.  Neurological:     General: No focal deficit present.     Mental Status: She is alert and oriented to person, place, and time.  Psychiatric:        Mood and Affect: Mood normal.        Behavior: Behavior normal.     LABORATORY DATA:  I have reviewed the labs as listed.  CBC Latest Ref Rng & Units 10/13/2020 09/22/2020 09/07/2020  WBC 4.0 - 10.5 K/uL 5.4 6.6 7.5  Hemoglobin 12.0 - 15.0 g/dL 12.9 11.8(L) 10.6(L)  Hematocrit 36.0 - 46.0 % 40.1 37.3 33.7(L)  Platelets 150 - 400 K/uL 190 283 314   CMP Latest Ref Rng & Units 10/13/2020 09/22/2020 09/07/2020  Glucose 70 - 99 mg/dL 109(H) 142(H) 147(H)  BUN 6 - 20 mg/dL 11 13 12   Creatinine 0.44  - 1.00 mg/dL 0.70 0.59 0.79  Sodium 135 - 145 mmol/L 137 136 137  Potassium 3.5 - 5.1 mmol/L 3.3(L) 3.1(L) 3.4(L)  Chloride 98 - 111 mmol/L 100 98 100  CO2 22 - 32 mmol/L 27 27 27   Calcium 8.9 - 10.3 mg/dL 9.3 9.1 9.0  Total Protein 6.5 - 8.1 g/dL 6.8 7.1 6.7  Total Bilirubin 0.3 - 1.2 mg/dL 0.7 0.6 0.3  Alkaline Phos 38 - 126 U/L 82 77 86  AST 15 - 41 U/L 36 23 38  ALT 0 - 44 U/L 26 15 35   CA125 210 (H) 09/22/2020  CA125 235 (H) 08/24/2020  CA125 285 (H) 07/31/2020    DIAGNOSTIC IMAGING:  I have independently reviewed the scans and discussed with the patient. No results found.   ASSESSMENT:  1.Stage IVa low-grade ovarian cancer: -05/05/2016-exploratory laparotomy, posterior exenteration, argon beam ablation of peritoneum, diaphragm stripping, cholecystectomy, omentectomy, appendectomy and optimal tumor debulking by Dr. Ovidio Hanger. -06/24/2016: Started Taxol/carboplatin. 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 Taxol discontinued cycle 5. -10/24/2016: Started maintenance letrozole. -March 2020bevacizumab started andcompleted22 cycles until 12/02/2019, discontinued due to TIA and uncontrolled hypertension. -Germline mutation testing reportedly done at [facility] was negative. -She is under the care of Dr. Merla Riches July 2021 -CTAP on 04/03/2020 with stable appearance of calcified omental and mesenteric nodules. Soft tissue fullness in the right adnexal region is stable. -MRI of the left hip on 04/23/2020 with 18 mm bone lesion in the peripheral superior aspect of the left greater trochanter -XRT to left femur lesion, 30 Gray from 05/06/2020 through 05/20/2020. -CTAP on 06/24/2020 with overall progression of calcified and noncalcified peritoneal surface and omental lesions with slight progression of disease at the left lung base involving the pleura. Stable sclerotic bone lesion involving the left greater trochanter. -3 cycles of carboplatin and gemcitabine from 07/03/2020 through  08/17/2020. -CT CAP on 09/02/2020 showed right hilar lymph node slightly increased in size.  Calcified left lower paratracheal lymph node also slightly increased in size.  CT of the chest was compared to prior CT scan from 04/03/2020.  CTAP showed hypodense lesion in the dome of the right hepatic lobe is mildly hyperdense measuring 1.1 x 0.8 cm, previously the same.  Hypodense lesion in the lateral segment of the left hepatic lobe measures 2 x 1.7 cm, formerly 1.8 x 1.3 cm. -CA-125 has steadily improved.  2. Social/family history: -Lives by herself. Worked as a Land. 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 Quit smoking 2 years ago.  3. Abdominal aneurysm: -CTAP on 06/24/2020 shows stable 3.4 x 3.3 cm infrarenal abdominal aortic aneurysm. -Used to follow with  vascular surgery at [facility].   PLAN:  1.Stage IVa low-grade ovarian cancer: -She has tolerated single agent carboplatin AUC 4 very well. - She lost about 5 pounds in the last 3 weeks due to decreased appetite.  She is drinking about 2 cartons of Ensure daily, each carton has 275 cal in it.  She is eating about 1 meal per day. - She is complaining of left hip pain along with left leg pain.  She also has slight weakness in the left leg when she walks. - Recommend MRI of the left hip as well as MRI of the lumbar spine with and without contrast. - For weight loss we will start her on Marinol 5 mg twice daily. - She will proceed with carboplatin dose increased to 600 mg today.  Reevaluate in 3 weeks.  2. Back pain/neuropathy: -Continue MS Contin 30 mg every 8 hours. - Continue Dilaudid 4 mg every 4 hours for breakthrough pain. - Continue gabapentin 900 mg 3 times a day. - She still has some burning sensation of her left leg from neuropathic pain. - We will order compounding cream with lidocaine, NSAID and send it to [facility] in [place].  3. Hypertension: -Continue lisinopril/HCTZ daily.  4.  Nausea/vomiting: -Continue Phenergan and Zofran as needed.  5. Lower extremity swelling: -She developed swelling when she walks on the left leg.  Previous Doppler in [place] [facility] was negative for DVT. - There is erythema which blanches on pressure.  Occasional mottling of the skin also present. - Recommend follow-up with vascular surgery at [facility] in [place] who has seen her before.  6.  Hypokalemia: -Continue potassium 10 mEq tablets 2 daily.   Orders placed this encounter:  Orders Placed This Encounter  Procedures  . MR Lumbar Spine W Wo Contrast  . MR HIP LEFT WO CONTRAST     Derek Jack, MD [place] [phone]   I, Milinda Antis, am acting as a scribe for Dr. Sanda Linger.  I, Derek Jack MD, have reviewed the above documentation for accuracy and completeness, and I agree with the above.

## 2020-10-13 NOTE — Patient Instructions (Signed)
[facility]  Discharge Instructions: Thank you for choosing [facility] to provide your oncology and hematology care.  If you have a lab appointment with the [facility], please come in thru the Main Entrance and check in at the main information desk.  Wear comfortable clothing and clothing appropriate for easy access to any Portacath or PICC line.   We strive to give you quality time with your provider. You may need to reschedule your appointment if you arrive late (15 or more minutes).  Arriving late affects you and other patients whose appointments are after yours.  Also, if you miss three or more appointments without notifying the office, you may be dismissed from the clinic at the provider's discretion.      For prescription refill requests, have your pharmacy contact our office and allow 72 hours for refills to be completed.    Today you received the following chemotherapy and/or immunotherapy agent Carboplatin.    To help prevent nausea and vomiting after your treatment, we encourage you to take your nausea medication as directed.  BELOW ARE SYMPTOMS THAT SHOULD BE REPORTED IMMEDIATELY: . *FEVER GREATER THAN 100.4 F (38 C) OR HIGHER . *CHILLS OR SWEATING . *NAUSEA AND VOMITING THAT IS NOT CONTROLLED WITH YOUR NAUSEA MEDICATION . *UNUSUAL SHORTNESS OF BREATH . *UNUSUAL BRUISING OR BLEEDING . *URINARY PROBLEMS (pain or burning when urinating, or frequent urination) . *BOWEL PROBLEMS (unusual diarrhea, constipation, pain near the anus) . TENDERNESS IN MOUTH AND THROAT WITH OR WITHOUT PRESENCE OF ULCERS (sore throat, sores in mouth, or a toothache) . UNUSUAL RASH, SWELLING OR PAIN  . UNUSUAL VAGINAL DISCHARGE OR ITCHING   Items with * indicate a potential emergency and should be followed up as soon as possible or go to the Emergency Department if any problems should occur.  Please show the CHEMOTHERAPY ALERT CARD or IMMUNOTHERAPY ALERT CARD at check-in to  the Emergency Department and triage nurse.  Should you have questions after your visit or need to cancel or reschedule your appointment, please contact [facility] [phone]  and follow the prompts.  Office hours are 8:00 a.m. to 4:30 p.m. Monday - Friday. Please note that voicemails left after 4:00 p.m. may not be returned until the following business day.  We are closed weekends and major holidays. You have access to a nurse at all times for urgent questions. Please call the main number to the clinic [phone] and follow the prompts.  For any non-urgent questions, you may also contact your provider using MyChart. We now offer e-Visits for anyone 67 and older to request care online for non-urgent symptoms. For details visit [email or web address].   Also download the MyChart app! Go to the app store, search "MyChart", open the app, select [facility], and log in with your MyChart username and password.  Due to Covid, a mask is required upon entering the hospital/clinic. If you do not have a mask, one will be given to you upon arrival. For doctor visits, patients may have 1 support person aged 82 or older with them. For treatment visits, patients cannot have anyone with them due to current Covid guidelines and our immunocompromised population.

## 2020-10-13 NOTE — Addendum Note (Signed)
Addended by: Derek Jack on: 10/13/2020 07:33 PM   Modules accepted: Orders

## 2020-10-13 NOTE — Patient Instructions (Signed)

## 2020-10-14 LAB — CA 125: Cancer Antigen (CA) 125: 217 U/mL — ABNORMAL HIGH (ref 0.0–38.1)

## 2020-10-15 ENCOUNTER — Other Ambulatory Visit (HOSPITAL_COMMUNITY): Payer: Self-pay

## 2020-10-20 ENCOUNTER — Ambulatory Visit (HOSPITAL_COMMUNITY): Payer: Medicare Other

## 2020-10-20 ENCOUNTER — Other Ambulatory Visit (HOSPITAL_COMMUNITY): Payer: Medicare Other

## 2020-10-24 ENCOUNTER — Encounter (HOSPITAL_COMMUNITY): Payer: Self-pay | Admitting: Emergency Medicine

## 2020-10-24 ENCOUNTER — Emergency Department (HOSPITAL_COMMUNITY)
Admission: EM | Admit: 2020-10-24 | Discharge: 2020-10-25 | Disposition: A | Discharge status: Home / Self Care | Payer: Medicare Other | Attending: Emergency Medicine | Admitting: Emergency Medicine

## 2020-10-24 ENCOUNTER — Emergency Department (HOSPITAL_COMMUNITY): Payer: Medicare Other

## 2020-10-24 ENCOUNTER — Other Ambulatory Visit: Payer: Self-pay

## 2020-10-24 DIAGNOSIS — I509 Heart failure, unspecified: Secondary | ICD-10-CM | POA: Insufficient documentation

## 2020-10-24 DIAGNOSIS — Z91040 Latex allergy status: Secondary | ICD-10-CM | POA: Insufficient documentation

## 2020-10-24 DIAGNOSIS — Z79899 Other long term (current) drug therapy: Secondary | ICD-10-CM | POA: Insufficient documentation

## 2020-10-24 DIAGNOSIS — R197 Diarrhea, unspecified: Secondary | ICD-10-CM | POA: Insufficient documentation

## 2020-10-24 DIAGNOSIS — Z8543 Personal history of malignant neoplasm of ovary: Secondary | ICD-10-CM | POA: Diagnosis not present

## 2020-10-24 DIAGNOSIS — I110 Hypertensive heart disease with heart failure: Secondary | ICD-10-CM | POA: Diagnosis not present

## 2020-10-24 DIAGNOSIS — E876 Hypokalemia: Secondary | ICD-10-CM | POA: Diagnosis not present

## 2020-10-24 DIAGNOSIS — Z87891 Personal history of nicotine dependence: Secondary | ICD-10-CM | POA: Diagnosis not present

## 2020-10-24 DIAGNOSIS — R1084 Generalized abdominal pain: Secondary | ICD-10-CM | POA: Insufficient documentation

## 2020-10-24 DIAGNOSIS — Z7982 Long term (current) use of aspirin: Secondary | ICD-10-CM | POA: Insufficient documentation

## 2020-10-24 DIAGNOSIS — J90 Pleural effusion, not elsewhere classified: Secondary | ICD-10-CM | POA: Insufficient documentation

## 2020-10-24 DIAGNOSIS — M7989 Other specified soft tissue disorders: Secondary | ICD-10-CM | POA: Insufficient documentation

## 2020-10-24 DIAGNOSIS — R112 Nausea with vomiting, unspecified: Secondary | ICD-10-CM | POA: Insufficient documentation

## 2020-10-24 LAB — CBC WITH DIFFERENTIAL/PLATELET
Abs Immature Granulocytes: 0.04 10*3/uL (ref 0.00–0.07)
Basophils Absolute: 0.1 10*3/uL (ref 0.0–0.1)
Basophils Relative: 1 %
Eosinophils Absolute: 0 10*3/uL (ref 0.0–0.5)
Eosinophils Relative: 0 %
HCT: 40.4 % (ref 36.0–46.0)
Hemoglobin: 13.3 g/dL (ref 12.0–15.0)
Immature Granulocytes: 0 %
Lymphocytes Relative: 18 %
Lymphs Abs: 1.6 10*3/uL (ref 0.7–4.0)
MCH: 30.5 pg (ref 26.0–34.0)
MCHC: 32.9 g/dL (ref 30.0–36.0)
MCV: 92.7 fL (ref 80.0–100.0)
Monocytes Absolute: 0.7 10*3/uL (ref 0.1–1.0)
Monocytes Relative: 7 %
Neutro Abs: 6.8 10*3/uL (ref 1.7–7.7)
Neutrophils Relative %: 74 %
Platelets: 185 10*3/uL (ref 150–400)
RBC: 4.36 MIL/uL (ref 3.87–5.11)
RDW: 17.1 % — ABNORMAL HIGH (ref 11.5–15.5)
WBC: 9.2 10*3/uL (ref 4.0–10.5)
nRBC: 0 % (ref 0.0–0.2)

## 2020-10-24 LAB — URINALYSIS, ROUTINE W REFLEX MICROSCOPIC
Bilirubin Urine: NEGATIVE
Glucose, UA: NEGATIVE mg/dL
Hgb urine dipstick: NEGATIVE
Ketones, ur: 5 mg/dL — AB
Leukocytes,Ua: NEGATIVE
Nitrite: NEGATIVE
Protein, ur: NEGATIVE mg/dL
Specific Gravity, Urine: 1.045 — ABNORMAL HIGH (ref 1.005–1.030)
pH: 6 (ref 5.0–8.0)

## 2020-10-24 LAB — COMPREHENSIVE METABOLIC PANEL
ALT: 31 U/L (ref 0–44)
AST: 38 U/L (ref 15–41)
Albumin: 4.1 g/dL (ref 3.5–5.0)
Alkaline Phosphatase: 91 U/L (ref 38–126)
Anion gap: 8 (ref 5–15)
BUN: 17 mg/dL (ref 6–20)
CO2: 26 mmol/L (ref 22–32)
Calcium: 9.7 mg/dL (ref 8.9–10.3)
Chloride: 106 mmol/L (ref 98–111)
Creatinine, Ser: 0.75 mg/dL (ref 0.44–1.00)
GFR, Estimated: >60 mL/min (ref >60)
Glucose, Bld: 117 mg/dL — ABNORMAL HIGH (ref 70–99)
Potassium: 3.1 mmol/L — ABNORMAL LOW (ref 3.5–5.1)
Sodium: 140 mmol/L (ref 135–145)
Total Bilirubin: 0.6 mg/dL (ref 0.3–1.2)
Total Protein: 7.5 g/dL (ref 6.5–8.1)

## 2020-10-24 LAB — LIPASE, BLOOD: Lipase: 26 U/L (ref 11–51)

## 2020-10-24 MED ORDER — SODIUM CHLORIDE 0.9 % IV BOLUS
1000.0000 mL | Freq: Once | INTRAVENOUS | Status: AC
  Administered 2020-10-24: 1000 mL via INTRAVENOUS

## 2020-10-24 MED ORDER — HYDROMORPHONE HCL 2 MG PO TABS
2.0000 mg | ORAL_TABLET | Freq: Once | ORAL | Status: AC
  Administered 2020-10-24: 2 mg via ORAL
  Filled 2020-10-24: qty 1

## 2020-10-24 MED ORDER — POTASSIUM CHLORIDE CRYS ER 20 MEQ PO TBCR
40.0000 meq | EXTENDED_RELEASE_TABLET | Freq: Once | ORAL | Status: AC
  Administered 2020-10-24: 40 meq via ORAL
  Filled 2020-10-24: qty 2

## 2020-10-24 MED ORDER — IOHEXOL 300 MG/ML  SOLN
100.0000 mL | Freq: Once | INTRAMUSCULAR | Status: AC | PRN
  Administered 2020-10-24: 100 mL via INTRAVENOUS

## 2020-10-24 MED ORDER — ONDANSETRON HCL 4 MG/2ML IJ SOLN
4.0000 mg | Freq: Once | INTRAMUSCULAR | Status: AC
  Administered 2020-10-24: 4 mg via INTRAVENOUS
  Filled 2020-10-24: qty 2

## 2020-10-24 NOTE — ED Triage Notes (Signed)
Patient c/o nausea, vomiting, and diarrhea. Denies any fevers but states left states left side of body is warm. Per patient reaction she has after having Chemo for stage 4 ovarian cancer. Patient last had chemo on 4/26. Patient states she is now unable to take oral anti-nausea medication and keep it down. Patient also states she has become unsteady on her feet. Patient also c/o discoloration to left leg x past 21 days that is progressively getting worse; chemo doctor is aware of discoloration and was supposed to call in a "compound cream to apply" but has not yet.

## 2020-10-24 NOTE — Discharge Instructions (Signed)
As we discussed, your work-up today looked reassuring.  Your CT scan did mention some fluid on the left lung which was seen previously on your CT scan.  Please follow-up with your cancer doctor.  Additionally, I have arranged for an outpatient ultrasound of your leg to ensure that there is not a blood clot contributing to your symptoms.  As we discussed, you can show up to the main entrance of the hospital and get the ultrasound done.  Return to emergency department for any worsening pain, nausea/vomiting, inability eat or drink anything, fevers or any other worsening or concerning symptoms.

## 2020-10-24 NOTE — ED Provider Notes (Signed)
Medical screening examination/treatment/procedure(s) were conducted as a shared visit with non-physician practitioner(s) and myself.  I personally evaluated the patient during the encounter.  Clinical Impression:   Final diagnoses:  Nausea vomiting and diarrhea  Redness and swelling of lower leg  Generalized abdominal pain  Hypokalemia  Pleural effusion    Patient is a 51 year old female, currently undergoing chemotherapy for stage IV ovarian cancer, she states her last dose of chemo was on April 26.  She is having nausea vomiting and diarrhea, multiple days, feeling very weak in general, she feels like she has a warmth on the left side of her body but has not measured her temperature.  On exam she has a nontender abdomen, she has clear heart and lung sounds, she is not tachycardic, she is slightly hypertensive.  Mucous membranes are slightly dehydrated but no exudate, normal phonation, no edema of the legs.  Her left upper chest IV port site is clean without surrounding redness.  Labs, work-up for causes of nausea vomiting diarrhea, needs hydration.  Check for neutropenia    Noemi Chapel, MD 10/25/20 1751

## 2020-10-24 NOTE — ED Provider Notes (Signed)
[facility] EMERGENCY DEPARTMENT Provider Note   CSN: [id number] Arrival date & time: 10/24/20  1653     History Chief Complaint  Patient presents with  . Vomiting    Heidi Johnson is a 50 y.o. female with PMH/o CHF, HTN, Stroke, ovarian cancer who presents for evaluation of nausea/vomiting/diarrhea.  Patient reports she had chemo on 10/13/20.  She reports that since then, she has had nausea and difficulty tolerating p.o.  Last 2 days, this is progressed to vomiting and diarrhea.  She states she has had multiple episodes of nausea/vomiting/diarrhea.  She has Zofran, Phenergan at home which she has been trying to take but states she has not any improvement in symptoms.  She states she feels weak and overall does not have any energy.  She also reports pain to her left upper quadrant abdomen.  She also reports pain to her left lower extremity.  She states that this has been an ongoing issue.  She states she will have intermittent leg pain, swelling and discoloration that has been ongoing and is usually worsened by her chemo.  She has told her oncologist about this with no concrete answer.  She denies any chest pain, difficulty breathing, fevers, cough.  The history is provided by the patient.       Past Medical History:  Diagnosis Date  . Cancer ([place])    ovarian cancer  . CHF (congestive heart failure) ([place])   . Hypertension   . Panic attacks   . Stroke [facility])     Patient Active Problem List   Diagnosis Date Noted  . Vitamin D deficiency 07/10/2020  . Hypocalcemia 07/03/2020  . Social isolation 06/08/2020  . Other constipation 05/11/2020  . Acute hip pain, left 04/15/2020  . Anxiety, generalized 04/15/2020  . Malignant bone pain 04/06/2020  . Peritoneal carcinomatosis ([place]) 03/09/2020  . Skin infection 03/09/2020  . Encounter for removal of sutures 01/14/2020  . Abdominal aortic aneurysm (AAA) ([place]) 12/18/2019  . Chronic pain syndrome 12/18/2019  . Essential hypertension  12/18/2019  . History of TIA (transient ischemic attack) 12/18/2019  . Fecal incontinence 12/18/2019  . 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 Bipolar disorder ([place]) 12/18/2019  . Goals of care, counseling/discussion 12/18/2019  . Left breast mass 12/18/2019  . Ovarian cancer ([place]) 12/17/2019    Past Surgical History:  Procedure Laterality Date  . ABDOMINAL HYSTERECTOMY    . BACK SURGERY    . TUBAL LIGATION       OB History    Gravida  4   Para  3   Term      Preterm      AB  1   Living  3     SAB      IAB      Ectopic      Multiple      Live Births              Family History  Problem Relation Age of Onset  . Cancer Maternal Aunt        breast ca/GYN    Social History   Tobacco Use  . Smoking status: Former Smoker    Types: Cigarettes    Quit date: 06/20/2018    Years since quitting: 2.3  . Smokeless tobacco: Never Used  Vaping Use  . Vaping Use: Never used  Substance Use Topics  . Alcohol use: No  . Drug use: Yes    Types: Marijuana  Home Medications Prior to Admission medications   Medication Sig Start Date End Date Taking? Authorizing Provider  albuterol (VENTOLIN HFA) 108 (90 Base) MCG/ACT inhaler Inhale 2 puffs into the lungs as needed. 2 puffs into lungs every 6 hours as needed for wheezing or SOB 12/09/17   [provider]  aspirin 325 MG EC tablet Take 325 mg by mouth daily.    [provider]  clonazePAM (KLONOPIN) 1 MG tablet Take 1 mg by mouth 3 (three) times daily as needed for anxiety.    [provider]  cyclobenzaprine (FLEXERIL) 5 MG tablet Take 1 tablet by mouth 3 (three) times daily as needed. 11/01/19   [provider]  docusate sodium (COLACE) 100 MG capsule Take 100 mg by mouth 2 (two) times daily.    [provider]  dronabinol (MARINOL) 5 MG capsule Take 1 capsule (5 mg total) by mouth 2 (two) times daily before lunch and supper. 10/13/20   Derek Jack, MD  ergocalciferol (VITAMIN D2) 1.25 MG (50000 UT) capsule Take 1 capsule (50,000 Units total) by mouth once a week. 07/10/20   [name], MD  furosemide (LASIX) 20 MG tablet TAKE 2 TABLETS (40 MG TOTAL) BY MOUTH DAILY AS NEEDED FOR FLUID OR EDEMA. 09/11/20   Derek Jack, MD  gabapentin (NEURONTIN) 300 MG capsule Take 3 capsules (900 mg total) by mouth 3 (three) times daily. 09/22/20   Derek Jack, MD  hydrochlorothiazide (HYDRODIURIL) 25 MG tablet Take 25 mg by mouth daily.    [provider]  HYDROmorphone (DILAUDID) 4 MG tablet Take 1 tablet (4 mg total) by mouth every 4 (four) hours as needed for severe pain. 10/12/20   Derek Jack, MD  ibuprofen (ADVIL) 600 MG tablet Take 600 mg by mouth every 6 (six) hours as needed.    [provider]  lisinopril-hydrochlorothiazide (ZESTORETIC) 20-12.5 MG tablet Take 1 tablet by mouth 2 (two) times daily. 08/28/20   [provider]  MARINOL 5 MG capsule Take 1 capsule (5 mg total) by mouth 2 (two) times daily before lunch and supper. 10/13/20   Derek Jack, MD  morphine (MS CONTIN) 30 MG 12 hr tablet Take 1 tablet (30 mg total) by mouth every 8 (eight) hours. 10/06/20 11/05/20  Derek Jack, MD  naloxone [facility]) 2 MG/2ML injection Place 2 mg into the nose as needed.  12/26/17   [provider]  OLANZapine (ZYPREXA) 10 MG tablet Take 10 mg by mouth at bedtime. 11/28/19   [provider]  ondansetron (ZOFRAN) 8 MG tablet Take 1 tablet (8 mg total) by mouth every 8 (eight) hours as needed for nausea or vomiting. 09/22/20   Derek Jack, MD  ondansetron (ZOFRAN-ODT) 8 MG disintegrating tablet Take 1 tablet (8 mg total) by mouth every 8 (eight) hours as needed for nausea or vomiting. Patient not taking: Reported on 10/13/2020 06/25/20   [name], MD  polyethylene glycol (MIRALAX / GLYCOLAX) 17 g packet Take 17 g by mouth 2 (two) times daily. 07/10/20   [name], MD   potassium chloride (KLOR-CON M10) 10 MEQ tablet Take 2 tablets (20 mEq total) by mouth daily. 09/22/20   Derek Jack, MD  prochlorperazine (COMPAZINE) 10 MG tablet Take 1 tablet (10 mg total) by mouth every 6 (six) hours as needed (Nausea or vomiting). 08/17/20   Derek Jack, MD  promethazine (PHENERGAN) 12.5 MG tablet Take 1 tablet (12.5 mg total) by mouth every 6 (six) hours as needed for nausea or vomiting.  07/24/20   Tanner, Lyndon Code., PA-C  rosuvastatin (CRESTOR) 40 MG tablet Take 40 mg by mouth daily. 12/14/19   [provider]  senna (SENOKOT) 8.6 MG tablet Take 2 tablets (17.2 mg total) by mouth 3 (three) times daily. 07/10/20   [name], MD  ZOLOFT 50 MG tablet Take 1 tablet by mouth as directed  take #1/2 tablet daily x one week then take #1 tablet daily 08/20/20   [provider]    Allergies    Latex and Sulfamethoxazole-trimethoprim  Review of Systems   Review of Systems  Constitutional: Negative for fever.  Respiratory: Negative for cough and shortness of breath.   Cardiovascular: Negative for chest pain.  Gastrointestinal: Positive for abdominal pain, nausea and vomiting. Negative for diarrhea.  Genitourinary: Negative for dysuria and hematuria.  Musculoskeletal:       Leg pain  Neurological: Negative for headaches.  All other systems reviewed and are negative.   Physical Exam Updated Vital Signs BP 132/78   Pulse (!) 59   Temp 97.8 F (36.6 C) (Oral)   Resp 15   Ht 5\' 9"  (1.753 m)   Wt 86.2 kg   SpO2 98%   BMI 28.06 kg/m   Physical Exam Vitals and nursing note reviewed.  Constitutional:      Appearance: Normal appearance. She is well-developed.  HENT:     Head: Normocephalic and atraumatic.  Eyes:     General: Lids are normal.     Conjunctiva/sclera: Conjunctivae normal.     Pupils: Pupils are equal, round, and reactive to light.  Cardiovascular:     Rate and Rhythm: Normal rate and regular rhythm.     Pulses: Normal  pulses.          Radial pulses are 2+ on the right side and 2+ on the left side.       Dorsalis pedis pulses are 2+ on the right side and 2+ on the left side.     Heart sounds: Normal heart sounds. No murmur heard. No friction rub. No gallop.   Pulmonary:     Effort: Pulmonary effort is normal.     Breath sounds: Normal breath sounds.     Comments: Lungs clear to auscultation bilaterally.  Symmetric chest rise.  No wheezing, rales, rhonchi. Abdominal:     Palpations: Abdomen is soft. Abdomen is not rigid.     Tenderness: There is generalized abdominal tenderness. There is no guarding.     Comments: Abdomen is soft, non-distended.  There is generalized tenderness with no focal point.. No rigidity, No guarding. No peritoneal signs.  Musculoskeletal:        General: Normal range of motion.     Cervical back: Full passive range of motion without pain.  Skin:    General: Skin is warm and dry.     Capillary Refill: Capillary refill takes less than 2 seconds.     Comments: Diffuse erythema noted to the left lower extremity.  No overlying warmth, induration, edema. Good distal cap refill. LLE is not dusky in appearance or cool to touch.  Neurological:     Mental Status: She is alert and oriented to person, place, and time.  Psychiatric:        Speech: Speech normal.      ED Results / Procedures / Treatments   Labs (all labs ordered are listed, but only abnormal results are displayed) Labs Reviewed  COMPREHENSIVE METABOLIC PANEL - Abnormal; Notable for the following components:  Result Value   Potassium 3.1 (*)    Glucose, Bld 117 (*)    All other components within normal limits  CBC WITH DIFFERENTIAL/PLATELET - Abnormal; Notable for the following components:   RDW 17.1 (*)    All other components within normal limits  LIPASE, BLOOD  URINALYSIS, ROUTINE W REFLEX MICROSCOPIC    EKG None  Radiology CT ABDOMEN PELVIS W CONTRAST  Result Date: 10/24/2020 CLINICAL DATA:   49 year old female with abdominal pain. History of metastatic ovarian cancer. EXAM: CT ABDOMEN AND PELVIS WITH CONTRAST TECHNIQUE: Multidetector CT imaging of the abdomen and pelvis was performed using the standard protocol following bolus administration of intravenous contrast. CONTRAST:  156mL OMNIPAQUE IOHEXOL 300 MG/ML  SOLN COMPARISON:  CT of the abdomen pelvis dated 06/24/2020. FINDINGS: Lower chest: Partially visualized moderate left pleural effusion, increased in size since the prior CT. There is diffuse calcified pleural based nodules and plaques in the left lower lobe as seen on the prior CT. The right lung base is clear. There is coronary vascular calcification. No intra-abdominal free air or free fluid. Hepatobiliary: Heterogeneous appearance of the liver. No intrahepatic biliary ductal dilatation. Similar appearance of a 2 cm hypodense lesion in the left lobe of the liver. Cholecystectomy. Pancreas: Unremarkable. No pancreatic ductal dilatation or surrounding inflammatory changes. Spleen: Normal in size without focal abnormality. Adrenals/Urinary Tract: The adrenal glands unremarkable. There is no hydronephrosis on either side. There is symmetric enhancement and excretion of contrast by both kidneys. The visualized ureters and urinary bladder appear unremarkable. Stomach/Bowel: Postsurgical changes of partial sigmoid colon resection. There is no bowel obstruction. Vascular/Lymphatic: There is advanced calcified and noncalcified plaques of the abdominal aorta. There is noncalcified plaque or mural thrombus with narrowing and irregularity of the aortic lumen. There is a 3.4 cm fusiform infrarenal abdominal aortic aneurysm similar to prior CT. The IVC is unremarkable. No portal venous gas. There is no adenopathy. There is a 10 mm nodular density abutting the colon in the right lower quadrant (61/2) consistent with serosal implant. Similar appearance of scattered omental nodularity in the left upper  quadrant. A 3.8 cm soft tissue nodule in the anterior right hemipelvis similar to prior CT. Reproductive: Hysterectomy. Other: Anterior abdominal wall surgical incision/scar. Musculoskeletal: Degenerative changes of the spine. Sclerotic focus Involving the greater trochanter of the left femur as well as smaller sclerotic lesions of the left femoral neck as seen on the prior CT. no acute osseous pathology. IMPRESSION: 1. No acute intra-abdominal or pelvic pathology. No bowel obstruction. 2. Similar appearance of omental and peritoneal metastatic disease. 3. Partially visualized moderate left pleural effusion, increased in size since the prior CT. 4. Aortic Atherosclerosis (ICD10-I70.0). Electronically Signed   By: Anner Crete M.D.   On: 10/24/2020 22:11    Procedures Procedures   Medications Ordered in ED Medications  sodium chloride 0.9 % bolus 1,000 mL (1,000 mLs Intravenous New Bag/Given 10/24/20 2057)  ondansetron (ZOFRAN) injection 4 mg (4 mg Intravenous Given 10/24/20 2057)  iohexol (OMNIPAQUE) 300 MG/ML solution 100 mL (100 mLs Intravenous Contrast Given 10/24/20 2134)  HYDROmorphone (DILAUDID) tablet 2 mg (2 mg Oral Given 10/24/20 2126)  potassium chloride SA (KLOR-CON) CR tablet 40 mEq (40 mEq Oral Given 10/24/20 2126)    ED Course  I have reviewed the triage vital signs and the nursing notes.  Pertinent labs & imaging results that were available during my care of the patient were reviewed by me and considered in my medical decision making (see chart for  details).    MDM Rules/Calculators/A&P                          50 year old female past ministry ovarian cancer who presents for evaluation of abdominal pain, nausea/vomiting/diarrhea.  She states that she has felt poorly since her last chemo on 10/13/20.  Over the last 2 to 3 days, she has had nausea/vomiting/diarrhea.  She is also is reporting some abdominal pain.  She also reports some pain and redness to her left leg but states this has  been an ongoing issue for months.  Her oncologist told her it was because of the chemo medication.  She denies any new trauma, injury, fall.  On initial arrival, she is afebrile, toxic appearing.  Vital signs are stable.  On exam, she is well-appearing.  She does have some subtle diffuse abdominal tenderness with no focal point.  No rigidity, guarding.  On exam, she has equal pulses in all 4 extremities.  Left lower extremity is slightly erythematous but no warmth, induration that would be concerning for cellulitis.  She can range of motion the leg with any difficulty.  History/physical exam not concerning for septic arthritis, ischemic limb.  Does not appear edematous.  She does report that this has been ongoing for several months which lowers my suspicion for acute DVT.  She actually had an ultrasound done in January that was negative for DVT.  We will plan to check labs, give fluids, antiemetics.  CMP shows potassium of 3.1.  BUN and creatinine within normal limits.  CBC shows no leukocytosis.  Lipase is unremarkable.  CT ab pelvis shows no acute intra-abdominal or pelvic pathology.  No bowel obstruction.  She has similar appearance of omental and peritoneal metastatic disease.  She has a moderate left pleural effusion that is slightly increased from prior CT scan.  I reviewed patient's record.  There is multiple mentions of her left lower leg causing pain, swelling.  Usually gets worse after she has chemo treatments.  This is mention by oncology and thinks it is a reaction to the chemo.  I do not see record of her ultrasound but patient states that it was done in [place].  Reevaluation.  Patient resting comfortably in bed.  She is hemodynamically stable.  She has not had any vomiting since being here in the ED.  He is able to tolerate some water here in the ED without any vomiting.  I discussed with her at length regarding her work-up today.  At this time, I do not see anything that would  require admission.  I discussed with her at length regarding her leg.  We did arrange for an outpatient ultrasound as we do not have ultrasound at this facility tonight.  We discussed the risk versus benefits of doing a dose of Lovenox here in the ED until she is able to obtain her ultrasound.  After discussion, patient opted declined dose of blood thinner here in ED which I feel is reasonable.  Given that her symptoms have been ongoing for several months, I have lower suspicion that this is a DVT.  At this time, patient is well-appearing, has not had any more vomiting here in the ED.  Patient meets no admission criteria.  I have arranged for her to have an ultrasound done on outpatient basis.  She is actually coming back on Tuesday to get an MRI.  Instructed her to follow-up with her oncologist. At this time, patient  exhibits no emergent life-threatening condition that require further evaluation in ED. Discussed patient with Dr. Sabra Heck who is agreeable to plan. Patient had ample opportunity for questions and discussion. All patient's questions were answered with full understanding. Strict return precautions discussed. Patient expresses understanding and agreement to plan.   Portions of this note were generated with [employer]ist. Dictation errors may occur despite best attempts at proofreading.   Final Clinical Impression(s) / ED Diagnoses Final diagnoses:  Nausea vomiting and diarrhea  Redness and swelling of lower leg  Generalized abdominal pain  Hypokalemia  Pleural effusion    Rx / DC Orders ED Discharge Orders         Ordered    US Venous Img Lower Unilateral Left        10/24/20 2205           Volanda Napoleon, PA-C 10/24/20 2320    Noemi Chapel, MD 10/25/20 1751

## 2020-10-26 ENCOUNTER — Other Ambulatory Visit (HOSPITAL_COMMUNITY): Payer: Self-pay

## 2020-10-26 DIAGNOSIS — C569 Malignant neoplasm of unspecified ovary: Secondary | ICD-10-CM

## 2020-10-27 ENCOUNTER — Other Ambulatory Visit (HOSPITAL_COMMUNITY): Payer: Self-pay

## 2020-10-27 NOTE — Progress Notes (Signed)
Call received from [facility], patient's daughter stating that patient is currently admitted in [place], [place] hospital. She reports a pulmonary embolism and having started on heparin infusion. Dr. Delton Coombes made aware. MRI to be rescheduled when patient is out of hospital. Patient requests that [place] LLE be discontinued as she states this has been done already. Dr. Delton Coombes aware and agreeable to discontinue.

## 2020-10-28 ENCOUNTER — Ambulatory Visit (HOSPITAL_COMMUNITY): Payer: Medicare Other

## 2020-10-31 NOTE — Progress Notes (Signed)
Heidi Johnson, [place] [address]   CLINIC:  Medical Oncology/Hematology  PCP:  Derek Jack, [place] / [place] [address] [phone]   REASON FOR VISIT:  Follow-up for ovarian cancer and peritoneal carcinomatosis  PRIOR THERAPY:  1. Exploratory laparotomy, posterior exenteration, argon beam ablation of peritoneum, omentectomy and optimal tumor debulking on 05/05/2016. 2. Carboplatin and paclitaxel x 5 cycles from 06/24/2016. 3. Avastin x 22 cycles through 12/02/2019 discontinued due to TIA and hypertension. 4. Left femur radiation 30 Gy in 10 fractions from 05/07/2020 to 05/20/2020.  NGS Results: not done  CURRENT THERAPY: Carboplatin, gemcitabine and Aloxi every 3 weeks  BRIEF ONCOLOGIC HISTORY:  Oncology History Overview Note  Negative genetics at [facility]   Ovarian cancer ([place])  04/25/2016 Imaging   CT confirms 12cm ovarian masses with extensive peritoneal metastases and malignant pleural effusions   05/05/2016 Surgery   s/p ex-lap posterior exenteration, diaphragm stripping, cholecystectomy, omentectomy, appendectomy and optimal tumor debulking on 05/05/16   06/24/2016 - 10/24/2016 Chemotherapy   Started Taxol/Carboplatin. 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  Numerous left predominant calcified pleural metastases and associated small left pleural effusion are unchanged.  2.  Unchanged (3.0 cm) (series 3, image 96) left breast mass    02/07/2019 Imaging   Outside Ct abdomen and pelvis 1. Overall stable  small volume peritoneal tumor, as described above.  2. Unchanged infrarenal abdominal aortic aneurysm.    02/22/2019 Tumor Marker   Patient's tumor was tested for the following markers: CA-125 Results of the tumor marker test revealed 164   03/18/2019 Tumor Marker   Patient's tumor was tested for the following markers: CA-125 Results of the tumor marker test revealed 162   04/08/2019 Tumor Marker   Patient's tumor was tested for the following markers: CA-125 Results of the tumor marker test revealed 164   04/29/2019 Tumor Marker   Patient's tumor was tested for the following markers: CA-125 Results of the tumor marker test revealed 181   05/20/2019 Tumor Marker   Patient's tumor was tested for the following markers: CA-125 Results of the tumor marker test revealed 164   06/10/2019 Tumor Marker   Patient's tumor was tested for the following markers: CA-125 Results of the tumor marker test revealed 152   07/01/2019 Tumor Marker   Patient's tumor was tested for the following markers: CA-125 Results of the tumor marker test revealed 160   07/22/2019 Tumor Marker   Patient's tumor was tested for the following markers: CA-125 Results of the tumor marker test revealed 171   08/12/2019 Tumor Marker   Patient's tumor was tested for the following markers: CA-125 Results of the tumor marker test revealed 173   09/02/2019 Tumor Marker   Patient's tumor was tested for the following markers: CA-125 Results of the tumor marker test revealed 159   09/30/2019 Tumor Marker   Patient's tumor was tested for the following markers: CA-125 Results of the tumor marker test revealed 168   11/08/2019 Imaging   Outside CT imaging 1. Study  was performed at outside institution on 11/08/2019 and presented for review on 11/14/2019.  2. Stable calcified and noncalcified left-sided nodular and plaque-like pleural abnormalities with small chronic left pleural effusion compatible with stable treated pleural  metastasis. No new or enlarging lesions.  3. No enlarging thoracic lymph nodes.  4. Redemonstration of left breast mass. Correlation with mammography is suggested if not already performed   12/02/2019 Tumor Marker   Patient's tumor was tested for the following markers: CA-125 Results of the tumor marker test revealed 186   12/18/2019 Cancer Staging   Staging form: Ovary, Fallopian Tube, and Primary Peritoneal Carcinoma, AJCC 8th Edition - Clinical stage from 12/18/2019: FIGO Stage IV (rcT3c, cN0, cM1) - Signed by [name], MD on 04/24/2020   01/09/2020 Tumor Marker   Patient's tumor was tested for the following markers: CA-125 Results of the tumor marker test revealed 184   01/10/2020 Imaging   1. Treated left pleural and peritoneal metastatic disease. Areas of residual soft tissue prominence in the right anatomic pelvis are indeterminate. Comparison with prior exams would be helpful. 2. Hepatomegaly. Low-attenuation lesions in the liver are too small to characterize. Comparison with prior exams would be helpful. 3. Small left fibrothorax. 4. Infrarenal Aortic aneurysm NOS (ICD10-I71.9). Recommend followup by ultrasound in 2 years.  5. Aortic atherosclerosis (ICD10-I70.0). Coronary artery calcification.   03/09/2020 Tumor Marker   Patient's tumor was tested for the following markers: CA-125 Results of the tumor marker test revealed 214   04/01/2020 Tumor Marker   Patient's tumor was tested for the following markers: CA-125 Results of the tumor marker test revealed 245.   04/03/2020 Imaging   1. No acute findings in the abdomen or pelvis. Specifically, no findings to explain the patient's history of left upper quadrant pain with nausea and vomiting. 2. Stable appearance of calcified omental and mesenteric nodules consistent with treated metastatic disease. Soft tissue fullness seen in the right adnexal region on the prior exam is similar today. 3. Stable 1.6 x 1.2 cm low-density lesion  lateral segment left liver. 4. 3.5 cm infrarenal abdominal aortic aneurysm. Recommend follow-up ultrasound every 2 years. This recommendation follows ACR consensus guidelines: White Paper of the ACR Incidental Findings Committee II on Vascular Findings. J Am Coll Radiol 2013CJ:3944253. 5. Aortic Atherosclerosis (ICD10-I70.0).   04/15/2020 Imaging   Focus of abnormal uptake is seen involving the greater trochanter of the proximal left femur which corresponds to sclerotic density seen on prior CT scan and is concerning for metastatic disease.   04/23/2020 Imaging   1. No hip fracture, dislocation or avascular necrosis. 2. A 18 mm bone lesion in the peripheral superior aspect of the left greater trochanter with enhancement on postcontrast imaging. The overall appearance is most concerning for metastatic disease in the setting of known malignancy. 3. 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 4. Degenerative disease with disc height loss at L4-5 and L5-S1 with bilateral facet arthropathy.   06/08/2020 Tumor Marker   Patient's tumor was tested for the following markers: CA-125. Results of the tumor marker test revealed 272.   06/24/2020 Imaging   1. Overall progression of calcified and noncalcified peritoneal surface and omental lesions. 2. Slight progression of disease at the left lung base and involving the pleura. 3. Stable sclerotic bone lesion involving the left greater trochanter. 4. Stable infrarenal abdominal aortic aneurysm.   07/03/2020 -  Chemotherapy    Patient is on Treatment Plan: OVARIAN RECURRENT 3RD LINE CARBOPLATIN D1 / GEMCITABINE D1,8 (4/800)  Q21D      07/03/2020 Tumor Marker   Patient's tumor was tested for the following markers: CA-125 Results of the tumor marker test revealed 285.   07/10/2020 Tumor Marker   Patient's tumor was tested for the following markers: CA-125 Results of the tumor marker test revealed 332   07/23/2020 Tumor Marker   Patient's tumor was tested  for the following markers: CA-125 Results of the tumor marker test revealed 308     CANCER STAGING: Cancer Staging Ovarian cancer [facility]) Staging form: Ovary, Fallopian Tube, and Primary Peritoneal Carcinoma, AJCC 8th Edition - Clinical stage from 12/18/2019: FIGO Stage IV (rcT3c, cN0, cM1) - Signed by [name], MD on 04/24/2020   INTERVAL HISTORY:  Ms. IMMACOLATA WOLFERT, a 50 y.o. female, returns for routine follow-up and consideration for next cycle of chemotherapy. Heidi Johnson was last seen on 10/13/2020.  Due for cycle #6 of carboplatin and gemcitabine today.   Overall, she tells me she has been feeling pretty well. She reports going to the ED, and the blood clot moved from her leg up into the lungs. She is taking a blood thinner every 10 hr. She reports severe SOB which is worsened with any activity. She reports reduced swelling and pain in left calf and feet. She reports lessened balance. She denies black or bloody stools or nose bleeds. She reports light-headedness. She has been taking benadryl to mitigate itchiness from morphine; she has been taking morphine every 10 hrs 3 times a day. She reports watery diarrhea. He daughter reports her O2 levels at 86 the last night. She is on 3.5-4.0 L O2 with nasal cannula. She has been drinking 2 ensure daily with one small meal daily. She reports night sweats.   Overall, she feels ready for next cycle of chemo today.    REVIEW OF SYSTEMS:  Review of Systems  Constitutional: Positive for appetite change (25%) and fatigue (25%).  HENT:   Positive for trouble swallowing. Negative for nosebleeds.   Respiratory: Positive for shortness of breath.   Cardiovascular: Positive for chest pain.  Gastrointestinal: Positive for constipation, diarrhea and vomiting. Negative for blood in stool.  Neurological: Positive for dizziness, headaches, light-headedness and numbness.  Psychiatric/Behavioral: Positive for depression and sleep disturbance. The patient is  nervous/anxious.   All other systems reviewed and are negative.   PAST MEDICAL/SURGICAL HISTORY:  Past Medical History:  Diagnosis Date  . Cancer ([place])    ovarian cancer  . CHF (congestive heart failure) ([place])   . Hypertension   . Panic attacks   . Stroke [facility])    Past Surgical History:  Procedure Laterality Date  . ABDOMINAL HYSTERECTOMY    . BACK SURGERY    . TUBAL LIGATION      SOCIAL HISTORY:  Social History   Socioeconomic History  . Marital status: Divorced    Spouse name: Not on file  . Number of children: 3  . Years of education: Not on file  . Highest education level: Not on file  Occupational History  . Occupation: retired  Tobacco Use  . Smoking status: Former Smoker    Types: Cigarettes    Quit date: 06/20/2018    Years since quitting: 2.3  . Smokeless tobacco: Never Used  Vaping Use  . Vaping Use: Never used  Substance and Sexual Activity  . Alcohol use: No  . Drug use: Yes    Types: Marijuana  . Sexual activity: Yes  Other Topics Concern  . Not on file  Social History Narrative   Lives alone   Social Determinants of Health   Financial Resource Strain: High Risk  . Difficulty of Paying Living Expenses: Hard  Food Insecurity: No Food Insecurity  . Worried About Charity fundraiser in the Last Year: Never true  . Ran Out of Food in the Last Year: Never true  Transportation Needs: No Transportation Needs  . Lack of Transportation (Medical): No  . Lack of Transportation (Non-Medical): No  Physical Activity: Inactive  . Days of Exercise per Week: 0 days  . Minutes of Exercise per Session: 0 min  Stress: No Stress Concern Present  . Feeling of Stress : Only a little  Social Connections: Moderately Isolated  . Frequency of Communication with Friends and Family: Twice a week  . Frequency of Social Gatherings with Friends and Family: Twice a week  . Attends Religious Services: 1 to 4 times per year  . Active Member of Clubs or Organizations: No   . Attends Archivist Meetings: Never  . Marital Status: Divorced  Human resources officer Violence: Not At Risk  . Fear of Current or Ex-Partner: No  . Emotionally Abused: No  . Physically Abused: No  . Sexually Abused: No    FAMILY HISTORY:  Family History  Problem Relation Age of Onset  . Cancer Maternal Aunt        breast ca/GYN    CURRENT MEDICATIONS:  Current Outpatient Medications  Medication Sig Dispense Refill  . albuterol (VENTOLIN HFA) 108 (90 Base) MCG/ACT inhaler Inhale 2 puffs into the lungs as needed. 2 puffs into lungs every 6 hours as needed for wheezing or SOB    . aspirin 325 MG EC tablet Take 325 mg by mouth daily.    . clonazePAM (KLONOPIN) 1 MG tablet Take 1 mg by mouth 3 (three) times daily as needed for anxiety.    . cyclobenzaprine (FLEXERIL) 5 MG tablet Take 1 tablet by mouth 3 (three) times daily as needed.    . docusate sodium (COLACE) 100 MG capsule Take 100 mg by mouth 2 (two) times daily.    Marland Kitchen dronabinol (MARINOL) 5 MG capsule Take 1 capsule (5 mg total) by mouth 2 (two) times daily before lunch and supper. 60 capsule 1  . ergocalciferol (VITAMIN D2) 1.25 MG (50000 UT) capsule Take 1 capsule (50,000 Units total) by mouth once a week. 12 capsule 1  . furosemide (LASIX) 20 MG tablet TAKE 2 TABLETS (40 MG TOTAL) BY MOUTH DAILY AS NEEDED FOR FLUID OR EDEMA. 60 tablet 1  . gabapentin (NEURONTIN) 300 MG capsule Take 3 capsules (900 mg total) by mouth 3 (three) times daily. 270 capsule 4  . hydrochlorothiazide (HYDRODIURIL) 25 MG tablet Take 25 mg by mouth daily.    Marland Kitchen HYDROmorphone (DILAUDID) 4 MG tablet Take 1 tablet (4 mg total) by mouth every 4 (four) hours as needed for severe pain. 180 tablet 0  . ibuprofen (ADVIL) 600 MG tablet Take 600 mg by mouth every 6 (six) hours as needed.    Marland Kitchen lisinopril-hydrochlorothiazide (ZESTORETIC) 20-12.5 MG tablet Take 1 tablet by mouth 2 (two) times daily.    Marland Kitchen MARINOL 5 MG capsule Take 1 capsule (5 mg total) by mouth 2  (two) times daily before lunch and supper. 60 capsule 3  . morphine (MS CONTIN) 30 MG 12 hr tablet Take 1 tablet (30 mg total) by mouth every 8 (eight) hours. 90 tablet 0  . naloxone (NARCAN) 2 MG/2ML injection Place 2 mg  into the nose as needed.     Marland Kitchen OLANZapine (ZYPREXA) 10 MG tablet Take 10 mg by mouth at bedtime.    . ondansetron (ZOFRAN) 8 MG tablet Take 1 tablet (8 mg total) by mouth every 8 (eight) hours as needed for nausea or vomiting. 60 tablet 0  . ondansetron (ZOFRAN-ODT) 8 MG disintegrating tablet Take 1 tablet (8 mg total) by mouth every 8 (eight) hours as needed for nausea or vomiting. (Patient not taking: Reported on 10/13/2020) 60 tablet 1  . polyethylene glycol (MIRALAX / GLYCOLAX) 17 g packet Take 17 g by mouth 2 (two) times daily. 100 each 11  . potassium chloride (KLOR-CON M10) 10 MEQ tablet Take 2 tablets (20 mEq total) by mouth daily. 60 tablet 2  . prochlorperazine (COMPAZINE) 10 MG tablet Take 1 tablet (10 mg total) by mouth every 6 (six) hours as needed (Nausea or vomiting). 30 tablet 1  . promethazine (PHENERGAN) 12.5 MG tablet Take 1 tablet (12.5 mg total) by mouth every 6 (six) hours as needed for nausea or vomiting. 30 tablet 2  . rosuvastatin (CRESTOR) 40 MG tablet Take 40 mg by mouth daily.    Marland Kitchen senna (SENOKOT) 8.6 MG tablet Take 2 tablets (17.2 mg total) by mouth 3 (three) times daily. 90 tablet 1  . ZOLOFT 50 MG tablet Take 1 tablet by mouth as directed  take #1/2 tablet daily x one week then take #1 tablet daily     No current facility-administered medications for this visit.   Facility-Administered Medications Ordered in Other Visits  Medication Dose Route Frequency Provider Last Rate Last Admin  . heparin lock flush 100 unit/mL  500 Units Intracatheter Once [name], MD        ALLERGIES:  Allergies  Allergen Reactions  . Latex Rash and Hives  . Sulfamethoxazole-Trimethoprim Other (See Comments)    Reaction:  Unknown  Other reaction(s): Unknown When  48, dr told her not to take    PHYSICAL EXAM:  Performance status (ECOG): 1 - Symptomatic but completely ambulatory  There were no vitals filed for this visit. Wt Readings from Last 3 Encounters:  10/24/20 190 lb (86.2 kg)  10/13/20 190 lb 8 oz (86.4 kg)  09/22/20 195 lb 11.2 oz (88.8 kg)   Physical Exam Vitals reviewed.  Constitutional:      Appearance: Normal appearance.  Cardiovascular:     Rate and Rhythm: Normal rate and regular rhythm.     Pulses: Normal pulses.     Heart sounds: Normal heart sounds.  Pulmonary:     Effort: Pulmonary effort is normal.     Breath sounds: Normal breath sounds.  Neurological:     General: No focal deficit present.     Mental Status: She is alert and oriented to person, place, and time.  Psychiatric:        Mood and Affect: Mood normal.        Behavior: Behavior normal.     LABORATORY DATA:  I have reviewed the labs as listed.  CBC Latest Ref Rng & Units 10/24/2020 10/13/2020 09/22/2020  WBC 4.0 - 10.5 K/uL 9.2 5.4 6.6  Hemoglobin 12.0 - 15.0 g/dL 13.3 12.9 11.8(L)  Hematocrit 36.0 - 46.0 % 40.4 40.1 37.3  Platelets 150 - 400 K/uL 185 190 283   CMP Latest Ref Rng & Units 10/24/2020 10/13/2020 09/22/2020  Glucose 70 - 99 mg/dL 117(H) 109(H) 142(H)  BUN 6 - 20 mg/dL 17 11 13   Creatinine 0.44 - 1.00 mg/dL  0.75 0.70 0.59  Sodium 135 - 145 mmol/L 140 137 136  Potassium 3.5 - 5.1 mmol/L 3.1(L) 3.3(L) 3.1(L)  Chloride 98 - 111 mmol/L 106 100 98  CO2 22 - 32 mmol/L 26 27 27   Calcium 8.9 - 10.3 mg/dL 9.7 9.3 9.1  Total Protein 6.5 - 8.1 g/dL 7.5 6.8 7.1  Total Bilirubin 0.3 - 1.2 mg/dL 0.6 0.7 0.6  Alkaline Phos 38 - 126 U/L 91 82 77  AST 15 - 41 U/L 38 36 23  ALT 0 - 44 U/L 31 26 15     DIAGNOSTIC IMAGING:  I have independently reviewed the scans and discussed with the patient. CT ABDOMEN PELVIS W CONTRAST  Result Date: 10/24/2020 CLINICAL DATA:  50 year old female with abdominal pain. History of metastatic ovarian cancer. EXAM: CT ABDOMEN  AND PELVIS WITH CONTRAST TECHNIQUE: Multidetector CT imaging of the abdomen and pelvis was performed using the standard protocol following bolus administration of intravenous contrast. CONTRAST:  163mL OMNIPAQUE IOHEXOL 300 MG/ML  SOLN COMPARISON:  CT of the abdomen pelvis dated 06/24/2020. FINDINGS: Lower chest: Partially visualized moderate left pleural effusion, increased in size since the prior CT. There is diffuse calcified pleural based nodules and plaques in the left lower lobe as seen on the prior CT. The right lung base is clear. There is coronary vascular calcification. No intra-abdominal free air or free fluid. Hepatobiliary: Heterogeneous appearance of the liver. No intrahepatic biliary ductal dilatation. Similar appearance of a 2 cm hypodense lesion in the left lobe of the liver. Cholecystectomy. Pancreas: Unremarkable. No pancreatic ductal dilatation or surrounding inflammatory changes. Spleen: Normal in size without focal abnormality. Adrenals/Urinary Tract: The adrenal glands unremarkable. There is no hydronephrosis on either side. There is symmetric enhancement and excretion of contrast by both kidneys. The visualized ureters and urinary bladder appear unremarkable. Stomach/Bowel: Postsurgical changes of partial sigmoid colon resection. There is no bowel obstruction. Vascular/Lymphatic: There is advanced calcified and noncalcified plaques of the abdominal aorta. There is noncalcified plaque or mural thrombus with narrowing and irregularity of the aortic lumen. There is a 3.4 cm fusiform infrarenal abdominal aortic aneurysm similar to prior CT. The IVC is unremarkable. No portal venous gas. There is no adenopathy. There is a 10 mm nodular density abutting the colon in the right lower quadrant (61/2) consistent with serosal implant. Similar appearance of scattered omental nodularity in the left upper quadrant. A 3.8 cm soft tissue nodule in the anterior right hemipelvis similar to prior CT.  Reproductive: Hysterectomy. Other: Anterior abdominal wall surgical incision/scar. Musculoskeletal: Degenerative changes of the spine. Sclerotic focus Involving the greater trochanter of the left femur as well as smaller sclerotic lesions of the left femoral neck as seen on the prior CT. no acute osseous pathology. IMPRESSION: 1. No acute intra-abdominal or pelvic pathology. No bowel obstruction. 2. Similar appearance of omental and peritoneal metastatic disease. 3. Partially visualized moderate left pleural effusion, increased in size since the prior CT. 4. Aortic Atherosclerosis (ICD10-I70.0). Electronically Signed   By: Anner Crete M.D.   On: 10/24/2020 22:11     ASSESSMENT:   -06/24/2016: Started Taxol/carboplatin. 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 Taxol discontinued cycle 5. -10/24/2016: Started maintenance letrozole. -March 2020bevacizumab started andcompleted22 cycles until 12/02/2019, discontinued due to TIA and uncontrolled hypertension. -Germline mutation testing reportedly done at [facility] was negative. -She is under the care of Dr. Merla Riches July 2021 -CTAP on 04/03/2020 with stable appearance of calcified omental and mesenteric nodules. Soft tissue fullness in the right adnexal  region is stable. -MRI of the left hip on 04/23/2020 with 18 mm bone lesion in the peripheral superior aspect of the left greater trochanter -XRT to left femur lesion, 30 Gray from 05/06/2020 through 05/20/2020. -CTAP on 06/24/2020 with overall progression of calcified and noncalcified peritoneal surface and omental lesions with slight progression of disease at the left lung base involving the pleura. Stable sclerotic bone lesion involving the left greater trochanter. -3 cycles of carboplatin and gemcitabine from 07/03/2020 through 08/17/2020. -CT CAP on 09/02/2020 showed right hilar lymph node slightly increased in size. Calcified left lower paratracheal lymph node also slightly increased in size. CT  of the chest was compared to prior CT scan from 04/03/2020. CTAP showed hypodense lesion in the dome of the right hepatic lobe is mildly hyperdense measuring 1.1 x 0.8 cm, previously the same. Hypodense lesion in the lateral segment of the left hepatic lobe measures 2 x 1.7 cm, formerly 1.8 x 1.3 cm. -CA-125 has steadily improved.  2. Social/family history: -Lives by herself. Worked as a Land. 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 Quit smoking 2 years ago.  3. Abdominal aneurysm: -CTAP on 06/24/2020 shows stable 3.4 x 3.3 cm infrarenal abdominal aortic aneurysm. -Used to follow with vascular surgery at [facility].   PLAN:  1.Stage IVa low-grade ovarian cancer: -She has received single agent carboplatin on 09/22/2020 and 4/26 2022. - She was evaluated in the ER at [facility] on 10/24/2020 with abdominal pain. - CTAP with contrast on 10/24/2020 showed no acute intra-abdominal pathology.  Similar appearance of omental and peritoneal metastatic disease. - Last CA125 was 217 on 10/13/2020. - I will hold off on today's treatment because of recent pulmonary embolism and anticoagulation and hypoxia. - I have reviewed her labs today which showed normal chemistries and LFTs.  CBC was grossly normal with mild thrombocytopenia of 137. - She is not eating much and eats about half to 1 meal per day.  She is drinking 2 ensures per day. - I have told her to increase Ensure to 3 cans/day. - RTC 2 weeks for follow-up with labs and treatment.  Will likely reintroduce gemcitabine if the tumor marker goes up.  2. Back pain/neuropathy: -She will continue MS Contin 30 mg every 8 hours. - She has generalized itching on and off.  She will take Benadryl to control the itching. - She will continue Dilaudid 4 mg every 4 hours for breakthrough pain. - Continue gabapentin 900 mg 3 times a day.  3. Hypertension: -Blood pressure today is 94/70. - Recommend holding  lisinopril/HCTZ.  4. Nausea/vomiting: -Continue Phenergan and Zofran as needed.  We have sent a refill for Phenergan.  5.  Saddle pulmonary embolism: -CT angiogram of the chest on 10/27/2020 at [facility] showed saddle pulmonary embolus with extension into the right upper lobe, lower lobe and middle lobe pulmonary arteries as well as areas of segmental and subsegmental extension.  Left pulmonary artery competent appears to involve the left main pulmonary artery with areas of segmental and subsegmental extension into the left lower lobe.  Small left pleural effusion.  Findings concerning for right heart strain.  Small left pleural effusion. - Left leg Doppler on 10/27/2020 was negative for thrombosis. - She is currently receiving Lovenox 100 mg every 12 hours. - I will start her on Eliquis.  She will start her first dose this afternoon if she was able to fill the prescription.  Otherwise she will continue Lovenox. - O2 sats on  3 L oxygen is 93%.  6. Hypokalemia: -Continue potassium 10 mEq 2 tablets daily.   Orders placed this encounter:  No orders of the defined types were placed in this encounter.  Total time spent is 40 minutes with more than 50% of the time spent face-to-face discussing treatment plan, reviewing outside medical records, counseling and coordination of care.  Derek Jack, MD [facility] [phone]   I, Thana Ates, am acting as a scribe for Dr. Derek Jack.  I, Derek Jack MD, have reviewed the above documentation for accuracy and completeness, and I agree with the above.

## 2020-11-02 ENCOUNTER — Inpatient Hospital Stay (HOSPITAL_COMMUNITY): Payer: Medicare Other | Attending: Hematology

## 2020-11-02 ENCOUNTER — Encounter (HOSPITAL_COMMUNITY): Payer: Self-pay

## 2020-11-02 ENCOUNTER — Inpatient Hospital Stay (HOSPITAL_BASED_OUTPATIENT_CLINIC_OR_DEPARTMENT_OTHER): Payer: Medicare Other | Admitting: Hematology

## 2020-11-02 ENCOUNTER — Inpatient Hospital Stay (HOSPITAL_COMMUNITY): Payer: Medicare Other

## 2020-11-02 ENCOUNTER — Other Ambulatory Visit: Payer: Self-pay

## 2020-11-02 VITALS — BP 94/70 | HR 99 | Temp 97.1°F | Resp 18 | Wt 187.0 lb

## 2020-11-02 DIAGNOSIS — Z79811 Long term (current) use of aromatase inhibitors: Secondary | ICD-10-CM | POA: Insufficient documentation

## 2020-11-02 DIAGNOSIS — R112 Nausea with vomiting, unspecified: Secondary | ICD-10-CM | POA: Insufficient documentation

## 2020-11-02 DIAGNOSIS — M47819 Spondylosis without myelopathy or radiculopathy, site unspecified: Secondary | ICD-10-CM | POA: Diagnosis not present

## 2020-11-02 DIAGNOSIS — I714 Abdominal aortic aneurysm, without rupture: Secondary | ICD-10-CM | POA: Diagnosis not present

## 2020-11-02 DIAGNOSIS — I700 Atherosclerosis of aorta: Secondary | ICD-10-CM | POA: Insufficient documentation

## 2020-11-02 DIAGNOSIS — R197 Diarrhea, unspecified: Secondary | ICD-10-CM | POA: Insufficient documentation

## 2020-11-02 DIAGNOSIS — E876 Hypokalemia: Secondary | ICD-10-CM | POA: Diagnosis not present

## 2020-11-02 DIAGNOSIS — Z87891 Personal history of nicotine dependence: Secondary | ICD-10-CM | POA: Diagnosis not present

## 2020-11-02 DIAGNOSIS — C569 Malignant neoplasm of unspecified ovary: Secondary | ICD-10-CM | POA: Insufficient documentation

## 2020-11-02 DIAGNOSIS — T451X5A Adverse effect of antineoplastic and immunosuppressive drugs, initial encounter: Secondary | ICD-10-CM | POA: Diagnosis not present

## 2020-11-02 DIAGNOSIS — Z7982 Long term (current) use of aspirin: Secondary | ICD-10-CM | POA: Insufficient documentation

## 2020-11-02 DIAGNOSIS — G629 Polyneuropathy, unspecified: Secondary | ICD-10-CM | POA: Insufficient documentation

## 2020-11-02 DIAGNOSIS — R61 Generalized hyperhidrosis: Secondary | ICD-10-CM | POA: Diagnosis not present

## 2020-11-02 DIAGNOSIS — J910 Malignant pleural effusion: Secondary | ICD-10-CM | POA: Diagnosis not present

## 2020-11-02 DIAGNOSIS — Z9221 Personal history of antineoplastic chemotherapy: Secondary | ICD-10-CM | POA: Diagnosis not present

## 2020-11-02 DIAGNOSIS — R160 Hepatomegaly, not elsewhere classified: Secondary | ICD-10-CM | POA: Diagnosis not present

## 2020-11-02 DIAGNOSIS — C786 Secondary malignant neoplasm of retroperitoneum and peritoneum: Secondary | ICD-10-CM | POA: Diagnosis not present

## 2020-11-02 DIAGNOSIS — I10 Essential (primary) hypertension: Secondary | ICD-10-CM | POA: Diagnosis not present

## 2020-11-02 DIAGNOSIS — Z86718 Personal history of other venous thrombosis and embolism: Secondary | ICD-10-CM | POA: Insufficient documentation

## 2020-11-02 DIAGNOSIS — Z79899 Other long term (current) drug therapy: Secondary | ICD-10-CM | POA: Diagnosis not present

## 2020-11-02 DIAGNOSIS — G620 Drug-induced polyneuropathy: Secondary | ICD-10-CM

## 2020-11-02 DIAGNOSIS — Z8673 Personal history of transient ischemic attack (TIA), and cerebral infarction without residual deficits: Secondary | ICD-10-CM | POA: Diagnosis not present

## 2020-11-02 DIAGNOSIS — I2510 Atherosclerotic heart disease of native coronary artery without angina pectoris: Secondary | ICD-10-CM | POA: Diagnosis not present

## 2020-11-02 LAB — CBC WITH DIFFERENTIAL/PLATELET
Abs Immature Granulocytes: 0 10*3/uL (ref 0.00–0.07)
Basophils Absolute: 0 10*3/uL (ref 0.0–0.1)
Basophils Relative: 1 %
Eosinophils Absolute: 0.1 10*3/uL (ref 0.0–0.5)
Eosinophils Relative: 1 %
HCT: 38.4 % (ref 36.0–46.0)
Hemoglobin: 12.5 g/dL (ref 12.0–15.0)
Immature Granulocytes: 0 %
Lymphocytes Relative: 31 %
Lymphs Abs: 1.3 10*3/uL (ref 0.7–4.0)
MCH: 30.3 pg (ref 26.0–34.0)
MCHC: 32.6 g/dL (ref 30.0–36.0)
MCV: 93.2 fL (ref 80.0–100.0)
Monocytes Absolute: 0.5 10*3/uL (ref 0.1–1.0)
Monocytes Relative: 12 %
Neutro Abs: 2.2 10*3/uL (ref 1.7–7.7)
Neutrophils Relative %: 55 %
Platelets: 137 10*3/uL — ABNORMAL LOW (ref 150–400)
RBC: 4.12 MIL/uL (ref 3.87–5.11)
RDW: 17.2 % — ABNORMAL HIGH (ref 11.5–15.5)
WBC: 4.1 10*3/uL (ref 4.0–10.5)
nRBC: 0 % (ref 0.0–0.2)

## 2020-11-02 LAB — COMPREHENSIVE METABOLIC PANEL
ALT: 20 U/L (ref 0–44)
AST: 24 U/L (ref 15–41)
Albumin: 3.6 g/dL (ref 3.5–5.0)
Alkaline Phosphatase: 75 U/L (ref 38–126)
Anion gap: 7 (ref 5–15)
BUN: 12 mg/dL (ref 6–20)
CO2: 26 mmol/L (ref 22–32)
Calcium: 9 mg/dL (ref 8.9–10.3)
Chloride: 104 mmol/L (ref 98–111)
Creatinine, Ser: 0.82 mg/dL (ref 0.44–1.00)
GFR, Estimated: >60 mL/min (ref >60)
Glucose, Bld: 118 mg/dL — ABNORMAL HIGH (ref 70–99)
Potassium: 3.9 mmol/L (ref 3.5–5.1)
Sodium: 137 mmol/L (ref 135–145)
Total Bilirubin: 0.5 mg/dL (ref 0.3–1.2)
Total Protein: 6.5 g/dL (ref 6.5–8.1)

## 2020-11-02 LAB — VITAMIN D 25 HYDROXY (VIT D DEFICIENCY, FRACTURES): Vit D, 25-Hydroxy: 47.47 ng/mL (ref 30–100)

## 2020-11-02 MED ORDER — LOPERAMIDE HCL 2 MG PO CAPS: 2.0000 mg | ORAL_CAPSULE | ORAL | 3 refills | Status: AC | PRN

## 2020-11-02 MED ORDER — HEPARIN SOD (PORK) LOCK FLUSH 100 UNIT/ML IV SOLN
500.0000 [IU] | Freq: Once | INTRAVENOUS | Status: AC
Start: 2020-11-02 — End: 2020-11-02
  Administered 2020-11-02: 500 [IU] via INTRAVENOUS

## 2020-11-02 MED ORDER — PROMETHAZINE HCL 12.5 MG PO TABS: 12.5000 mg | ORAL_TABLET | Freq: Four times a day (QID) | ORAL | 3 refills | Status: DC | PRN

## 2020-11-02 MED ORDER — APIXABAN (ELIQUIS) VTE STARTER PACK (10MG AND 5MG): ORAL_TABLET | ORAL | 0 refills | Status: DC

## 2020-11-02 MED ORDER — SODIUM CHLORIDE 0.9% FLUSH
10.0000 mL | Freq: Once | INTRAVENOUS | Status: AC
Start: 2020-11-02 — End: 2020-11-02
  Administered 2020-11-02: 10 mL via INTRAVENOUS

## 2020-11-02 NOTE — Patient Instructions (Addendum)
[place] at [facility] Discharge Instructions  You were seen today by Dr. Delton Coombes. He went over your recent results. You will take your last dose of Lovenox and then stop, instead you will be prescribed Eliquis to be taken every 12 hours. Begin drinking 3 Boost/Ensure daily. You will stop taking Lisinopril. Dr. Delton Coombes will see you back in 2 weeks for labs and follow up.   Thank you for choosing [place] at [facility] to provide your oncology and hematology care.  To afford each patient quality time with our provider, please arrive at least 15 minutes before your scheduled appointment time.   If you have a lab appointment with the [facility] please come in thru the Main Entrance and check in at the main information desk  You need to re-schedule your appointment should you arrive 10 or more minutes late.  We strive to give you quality time with our providers, and arriving late affects you and other patients whose appointments are after yours.  Also, if you no show three or more times for appointments you may be dismissed from the clinic at the providers discretion.     Again, thank you for choosing [facility].  Our hope is that these requests will decrease the amount of time that you wait before being seen by our physicians.       _____________________________________________________________  Should you have questions after your visit to [facility], please contact our office at [phone] between the hours of 8:00 a.m. and 4:30 p.m.  Voicemails left after 4:00 p.m. will not be returned until the following business day.  For prescription refill requests, have your pharmacy contact our office and allow 72 hours.    Cancer Center Support Programs:   > Cancer Support Group  2nd Tuesday of the month 1pm-2pm, Journey Room

## 2020-11-02 NOTE — Progress Notes (Signed)
Patients port flushed without difficulty.  Good blood return noted with no bruising or swelling noted at site.  Stable during access and blood draw.  VVS.  Patient to remain accessed for treatment.

## 2020-11-02 NOTE — Progress Notes (Signed)
Patients port flushed without difficulty.  Good blood return noted with no bruising or swelling noted at site.  Band aid applied.  VSS with discharge and left in satisfactory condition with no s/s of distress noted.   

## 2020-11-03 LAB — CA 125: Cancer Antigen (CA) 125: 218 U/mL — ABNORMAL HIGH (ref 0.0–38.1)

## 2020-11-06 ENCOUNTER — Other Ambulatory Visit (HOSPITAL_COMMUNITY): Payer: Self-pay

## 2020-11-06 DIAGNOSIS — C786 Secondary malignant neoplasm of retroperitoneum and peritoneum: Secondary | ICD-10-CM

## 2020-11-06 DIAGNOSIS — C569 Malignant neoplasm of unspecified ovary: Secondary | ICD-10-CM

## 2020-11-06 MED ORDER — MORPHINE SULFATE ER 30 MG PO TBCR: 30.0000 mg | EXTENDED_RELEASE_TABLET | Freq: Three times a day (TID) | ORAL | 0 refills | Status: DC

## 2020-11-09 ENCOUNTER — Other Ambulatory Visit (HOSPITAL_COMMUNITY): Payer: Medicare Other

## 2020-11-09 ENCOUNTER — Ambulatory Visit (HOSPITAL_COMMUNITY): Payer: Medicare Other

## 2020-11-09 ENCOUNTER — Other Ambulatory Visit (HOSPITAL_COMMUNITY): Payer: Self-pay

## 2020-11-09 DIAGNOSIS — C786 Secondary malignant neoplasm of retroperitoneum and peritoneum: Secondary | ICD-10-CM

## 2020-11-09 DIAGNOSIS — C569 Malignant neoplasm of unspecified ovary: Secondary | ICD-10-CM

## 2020-11-09 MED ORDER — MORPHINE SULFATE ER 30 MG PO TBCR: 30.0000 mg | EXTENDED_RELEASE_TABLET | Freq: Three times a day (TID) | ORAL | 0 refills | Status: DC

## 2020-11-09 MED ORDER — HYDROMORPHONE HCL 4 MG PO TABS: 4.0000 mg | ORAL_TABLET | ORAL | 0 refills | Status: DC | PRN

## 2020-11-09 NOTE — Telephone Encounter (Signed)
For your review

## 2020-11-10 ENCOUNTER — Other Ambulatory Visit (HOSPITAL_COMMUNITY): Payer: Self-pay | Admitting: *Deleted

## 2020-11-11 ENCOUNTER — Other Ambulatory Visit (HOSPITAL_COMMUNITY): Payer: Self-pay | Admitting: Hematology

## 2020-11-17 NOTE — Progress Notes (Signed)
Heidi Johnson, [place] [address]   CLINIC:  Medical Oncology/Hematology  PCP:  Derek Jack, [place] / [place] [address] [phone]   REASON FOR VISIT:  Follow-up for ovarian cancer and peritoneal carcinomatosis  PRIOR THERAPY:  1. Exploratory laparotomy, posterior exenteration, argon beam ablation of peritoneum, omentectomy and optimal tumor debulking on 05/05/2016. 2. Carboplatin and paclitaxel x 5 cycles from 06/24/2016. 3. Avastin x 22 cycles through 12/02/2019 discontinued due to TIA and hypertension. 4. Left femur radiation 30 Gy in 10 fractions from 05/07/2020 to 05/20/2020.  NGS Results: not done  CURRENT THERAPY:  Carboplatin, gemcitabine and Aloxi every 3 weeks  BRIEF ONCOLOGIC HISTORY:  Oncology History Overview Note  Negative genetics at [facility]   Ovarian cancer ([place])  04/25/2016 Imaging   CT confirms 12cm ovarian masses with extensive peritoneal metastases and malignant pleural effusions   05/05/2016 Surgery   s/p ex-lap posterior exenteration, diaphragm stripping, cholecystectomy, omentectomy, appendectomy and optimal tumor debulking on 05/05/16   06/24/2016 - 10/24/2016 Chemotherapy   Started Taxol/Carboplatin. 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  Numerous left predominant calcified pleural metastases and associated small left pleural effusion are unchanged.  2.  Unchanged (3.0 cm) (series 3, image 96) left breast mass    02/07/2019 Imaging   Outside Ct abdomen and pelvis 1. Overall stable  small volume peritoneal tumor, as described above.  2. Unchanged infrarenal abdominal aortic aneurysm.    02/22/2019 Tumor Marker   Patient's tumor was tested for the following markers: CA-125 Results of the tumor marker test revealed 164   03/18/2019 Tumor Marker   Patient's tumor was tested for the following markers: CA-125 Results of the tumor marker test revealed 162   04/08/2019 Tumor Marker   Patient's tumor was tested for the following markers: CA-125 Results of the tumor marker test revealed 164   04/29/2019 Tumor Marker   Patient's tumor was tested for the following markers: CA-125 Results of the tumor marker test revealed 181   05/20/2019 Tumor Marker   Patient's tumor was tested for the following markers: CA-125 Results of the tumor marker test revealed 164   06/10/2019 Tumor Marker   Patient's tumor was tested for the following markers: CA-125 Results of the tumor marker test revealed 152   07/01/2019 Tumor Marker   Patient's tumor was tested for the following markers: CA-125 Results of the tumor marker test revealed 160   07/22/2019 Tumor Marker   Patient's tumor was tested for the following markers: CA-125 Results of the tumor marker test revealed 171   08/12/2019 Tumor Marker   Patient's tumor was tested for the following markers: CA-125 Results of the tumor marker test revealed 173   09/02/2019 Tumor Marker   Patient's tumor was tested for the following markers: CA-125 Results of the tumor marker test revealed 159   09/30/2019 Tumor Marker   Patient's tumor was tested for the following markers: CA-125 Results of the tumor marker test revealed 168   11/08/2019 Imaging   Outside CT imaging 1.  Study was performed at outside institution on 11/08/2019 and presented for review on 11/14/2019.  2. Stable calcified and noncalcified left-sided nodular and plaque-like pleural abnormalities with small chronic left pleural effusion compatible with stable treated pleural  metastasis. No new or enlarging lesions.  3. No enlarging thoracic lymph nodes.  4. Redemonstration of left breast mass. Correlation with mammography is suggested if not already performed   12/02/2019 Tumor Marker   Patient's tumor was tested for the following markers: CA-125 Results of the tumor marker test revealed 186   12/18/2019 Cancer Staging   Staging form: Ovary, Fallopian Tube, and Primary Peritoneal Carcinoma, AJCC 8th Edition - Clinical stage from 12/18/2019: FIGO Stage IV (rcT3c, cN0, cM1) - Signed by [name], MD on 04/24/2020   01/09/2020 Tumor Marker   Patient's tumor was tested for the following markers: CA-125 Results of the tumor marker test revealed 184   01/10/2020 Imaging   1. Treated left pleural and peritoneal metastatic disease. Areas of residual soft tissue prominence in the right anatomic pelvis are indeterminate. Comparison with prior exams would be helpful. 2. Hepatomegaly. Low-attenuation lesions in the liver are too small to characterize. Comparison with prior exams would be helpful. 3. Small left fibrothorax. 4. Infrarenal Aortic aneurysm NOS (ICD10-I71.9). Recommend followup by ultrasound in 2 years.  5. Aortic atherosclerosis (ICD10-I70.0). Coronary artery calcification.   03/09/2020 Tumor Marker   Patient's tumor was tested for the following markers: CA-125 Results of the tumor marker test revealed 214   04/01/2020 Tumor Marker   Patient's tumor was tested for the following markers: CA-125 Results of the tumor marker test revealed 245.   04/03/2020 Imaging   1. No acute findings in the abdomen or pelvis. Specifically, no findings to explain the patient's history of left upper quadrant pain with nausea and vomiting. 2. Stable appearance of calcified omental and mesenteric nodules consistent with treated metastatic disease. Soft tissue fullness seen in the right adnexal region on the prior exam is similar today. 3. Stable 1.6 x 1.2 cm low-density lesion  lateral segment left liver. 4. 3.5 cm infrarenal abdominal aortic aneurysm. Recommend follow-up ultrasound every 2 years. This recommendation follows ACR consensus guidelines: White Paper of the ACR Incidental Findings Committee II on Vascular Findings. J Am Coll Radiol 2013; 44:010-272. 5. Aortic Atherosclerosis (ICD10-I70.0).   04/15/2020 Imaging   Focus of abnormal uptake is seen involving the greater trochanter of the proximal left femur which corresponds to sclerotic density seen on prior CT scan and is concerning for metastatic disease.   04/23/2020 Imaging   1. No hip fracture, dislocation or avascular necrosis. 2. A 18 mm bone lesion in the peripheral superior aspect of the left greater trochanter with enhancement on postcontrast imaging. The overall appearance is most concerning for metastatic disease in the setting of known malignancy. 3. 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 4. Degenerative disease with disc height loss at L4-5 and L5-S1 with bilateral facet arthropathy.   06/08/2020 Tumor Marker   Patient's tumor was tested for the following markers: CA-125. Results of the tumor marker test revealed 272.   06/24/2020 Imaging   1. Overall progression of calcified and noncalcified peritoneal surface and omental lesions. 2. Slight progression of disease at the left lung base and involving the pleura. 3. Stable sclerotic bone lesion involving the left greater trochanter. 4. Stable infrarenal abdominal aortic aneurysm.   07/03/2020 -  Chemotherapy    Patient is on Treatment Plan: OVARIAN RECURRENT 3RD LINE CARBOPLATIN D1 / GEMCITABINE D1,8 (  4/800) Q21D      07/03/2020 Tumor Marker   Patient's tumor was tested for the following markers: CA-125 Results of the tumor marker test revealed 285.   07/10/2020 Tumor Marker   Patient's tumor was tested for the following markers: CA-125 Results of the tumor marker test revealed 332   07/23/2020 Tumor Marker   Patient's tumor was tested  for the following markers: CA-125 Results of the tumor marker test revealed 308     CANCER STAGING: Cancer Staging Ovarian cancer [facility]) Staging form: Ovary, Fallopian Tube, and Primary Peritoneal Carcinoma, AJCC 8th Edition - Clinical stage from 12/18/2019: FIGO Stage IV (rcT3c, cN0, cM1) - Signed by [name], MD on 04/24/2020   INTERVAL HISTORY:  Heidi Johnson, a 50 y.o. female, returns for routine follow-up and consideration for next cycle of chemotherapy. Heidi Johnson was last seen on 11/02/2020.  Due for cycle #6 of Carboplatin, gemcitabine and Aloxi today.   Overall, she tells me she has been feeling pretty well. Since she had a fall this morning caused by tripping. She reports improved breathing. She reports sleep disturbance due to leg pain which has not been helped by 10 mg olanzapine. Ice has alleviated some of the leg pain. She is on 3L O2. She denies any CP upon breathing. She uses a walker when walking at home. She drinks 2 Boost daily.   REVIEW OF SYSTEMS:  Review of Systems  Constitutional: Positive for appetite change (25%) and fatigue (25%).  Cardiovascular: Positive for leg swelling.  Gastrointestinal: Positive for constipation.  Musculoskeletal: Positive for back pain (8/10) and myalgias (legs).  Neurological: Positive for dizziness, headaches and numbness (feet and hands).  Psychiatric/Behavioral: Positive for sleep disturbance.  All other systems reviewed and are negative.   PAST MEDICAL/SURGICAL HISTORY:  Past Medical History:  Diagnosis Date  . Cancer ([place])    ovarian cancer  . CHF (congestive heart failure) ([place])   . Hypertension   . Panic attacks   . Stroke [facility])    Past Surgical History:  Procedure Laterality Date  . ABDOMINAL HYSTERECTOMY    . BACK SURGERY    . TUBAL LIGATION      SOCIAL HISTORY:  Social History   Socioeconomic History  . Marital status: Divorced    Spouse name: Not on file  . Number of children: 3  . Years of  education: Not on file  . Highest education level: Not on file  Occupational History  . Occupation: retired  Tobacco Use  . Smoking status: Former Smoker    Types: Cigarettes    Quit date: 06/20/2018    Years since quitting: 2.4  . Smokeless tobacco: Never Used  Vaping Use  . Vaping Use: Never used  Substance and Sexual Activity  . Alcohol use: No  . Drug use: Yes    Types: Marijuana  . Sexual activity: Yes  Other Topics Concern  . Not on file  Social History Narrative   Lives alone   Social Determinants of Health   Financial Resource Strain: High Risk  . Difficulty of Paying Living Expenses: Hard  Food Insecurity: No Food Insecurity  . Worried About Charity fundraiser in the Last Year: Never true  . Ran Out of Food in the Last Year: Never true  Transportation Needs: No Transportation Needs  . Lack of Transportation (Medical): No  . Lack of Transportation (Non-Medical): No  Physical Activity: Inactive  . Days of Exercise per Week: 0 days  . Minutes of  Exercise per Session: 0 min  Stress: No Stress Concern Present  . Feeling of Stress : Only a little  Social Connections: Moderately Isolated  . Frequency of Communication with Friends and Family: Twice a week  . Frequency of Social Gatherings with Friends and Family: Twice a week  . Attends Religious Services: 1 to 4 times per year  . Active Member of Clubs or Organizations: No  . Attends Archivist Meetings: Never  . Marital Status: Divorced  Human resources officer Violence: Not At Risk  . Fear of Current or Ex-Partner: No  . Emotionally Abused: No  . Physically Abused: No  . Sexually Abused: No    FAMILY HISTORY:  Family History  Problem Relation Age of Onset  . Cancer Maternal Aunt        breast ca/GYN    CURRENT MEDICATIONS:  Current Outpatient Medications  Medication Sig Dispense Refill  . albuterol (VENTOLIN HFA) 108 (90 Base) MCG/ACT inhaler Inhale 2 puffs into the lungs as needed. 2 puffs into  lungs every 6 hours as needed for wheezing or SOB    . APIXABAN (ELIQUIS) VTE STARTER PACK (10MG  AND 5MG ) Take as directed on package: start with two-5mg  tablets twice daily for 7 days. On day 8, switch to one-5mg  tablet twice daily. 1 each 0  . aspirin 325 MG EC tablet Take 325 mg by mouth daily.    . clonazePAM (KLONOPIN) 1 MG tablet Take 1 mg by mouth 3 (three) times daily as needed for anxiety.    . cyclobenzaprine (FLEXERIL) 5 MG tablet Take 1 tablet by mouth 3 (three) times daily as needed.    . docusate sodium (COLACE) 100 MG capsule Take 100 mg by mouth 2 (two) times daily.    Marland Kitchen dronabinol (MARINOL) 5 MG capsule Take 1 capsule (5 mg total) by mouth 2 (two) times daily before lunch and supper. 60 capsule 1  . ergocalciferol (VITAMIN D2) 1.25 MG (50000 UT) capsule Take 1 capsule (50,000 Units total) by mouth once a week. 12 capsule 1  . furosemide (LASIX) 20 MG tablet TAKE 2 TABLETS (40 MG TOTAL) BY MOUTH DAILY AS NEEDED FOR FLUID OR EDEMA. 60 tablet 1  . gabapentin (NEURONTIN) 300 MG capsule Take 3 capsules (900 mg total) by mouth 3 (three) times daily. 270 capsule 4  . hydrochlorothiazide (HYDRODIURIL) 25 MG tablet Take 25 mg by mouth daily.    Marland Kitchen HYDROmorphone (DILAUDID) 4 MG tablet Take 1 tablet (4 mg total) by mouth every 4 (four) hours as needed for severe pain. 180 tablet 0  . ibuprofen (ADVIL) 600 MG tablet Take 600 mg by mouth every 6 (six) hours as needed.    Marland Kitchen lisinopril-hydrochlorothiazide (ZESTORETIC) 20-12.5 MG tablet Take 1 tablet by mouth 2 (two) times daily.    Marland Kitchen loperamide (IMODIUM) 2 MG capsule Take 1 capsule (2 mg total) by mouth as needed for diarrhea or loose stools. 60 capsule 3  . MARINOL 5 MG capsule Take 1 capsule (5 mg total) by mouth 2 (two) times daily before lunch and supper. 60 capsule 3  . morphine (MS CONTIN) 30 MG 12 hr tablet Take 1 tablet (30 mg total) by mouth every 8 (eight) hours. 90 tablet 0  . naloxone (NARCAN) 2 MG/2ML injection Place 2 mg into the nose  as needed.     Marland Kitchen OLANZapine (ZYPREXA) 10 MG tablet Take 10 mg by mouth at bedtime.    . ondansetron (ZOFRAN) 8 MG tablet Take 1 tablet (8 mg  total) by mouth every 8 (eight) hours as needed for nausea or vomiting. 60 tablet 0  . ondansetron (ZOFRAN-ODT) 8 MG disintegrating tablet Take 1 tablet (8 mg total) by mouth every 8 (eight) hours as needed for nausea or vomiting. 60 tablet 1  . polyethylene glycol (MIRALAX / GLYCOLAX) 17 g packet Take 17 g by mouth 2 (two) times daily. 100 each 11  . potassium chloride (KLOR-CON M10) 10 MEQ tablet Take 2 tablets (20 mEq total) by mouth daily. 60 tablet 2  . prochlorperazine (COMPAZINE) 10 MG tablet Take 1 tablet (10 mg total) by mouth every 6 (six) hours as needed (Nausea or vomiting). 30 tablet 1  . promethazine (PHENERGAN) 12.5 MG tablet Take 1 tablet (12.5 mg total) by mouth every 6 (six) hours as needed for nausea or vomiting. 60 tablet 3  . rosuvastatin (CRESTOR) 40 MG tablet Take 40 mg by mouth daily.    Marland Kitchen senna (SENOKOT) 8.6 MG tablet Take 2 tablets (17.2 mg total) by mouth 3 (three) times daily. 90 tablet 1  . ZOLOFT 50 MG tablet Take 1 tablet by mouth as directed  take #1/2 tablet daily x one week then take #1 tablet daily     No current facility-administered medications for this visit.   Facility-Administered Medications Ordered in Other Visits  Medication Dose Route Frequency Provider Last Rate Last Admin  . heparin lock flush 100 unit/mL  500 Units Intracatheter Once [name], MD        ALLERGIES:  Allergies  Allergen Reactions  . Latex Rash and Hives  . Sulfamethoxazole-Trimethoprim Other (See Comments)    Reaction:  Unknown  Other reaction(s): Unknown When 31, dr told her not to take    PHYSICAL EXAM:  Performance status (ECOG): 1 - Symptomatic but completely ambulatory  There were no vitals filed for this visit. Wt Readings from Last 3 Encounters:  11/02/20 187 lb (84.8 kg)  10/24/20 190 lb (86.2 kg)  10/13/20 190 lb 8 oz  (86.4 kg)   Physical Exam Vitals reviewed.  Constitutional:      Appearance: Normal appearance.  Cardiovascular:     Rate and Rhythm: Normal rate and regular rhythm.     Pulses: Normal pulses.     Heart sounds: Normal heart sounds.  Pulmonary:     Effort: Pulmonary effort is normal.     Breath sounds: Normal breath sounds.  Abdominal:     Palpations: Abdomen is soft. There is no hepatomegaly, splenomegaly or mass.     Tenderness: There is no abdominal tenderness.  Musculoskeletal:     Right lower leg: No edema.     Left lower leg: No edema.  Neurological:     General: No focal deficit present.     Mental Status: She is alert and oriented to person, place, and time.  Psychiatric:        Mood and Affect: Mood normal.        Behavior: Behavior normal.     LABORATORY DATA:  I have reviewed the labs as listed.  CBC Latest Ref Rng & Units 11/02/2020 10/24/2020 10/13/2020  WBC 4.0 - 10.5 K/uL 4.1 9.2 5.4  Hemoglobin 12.0 - 15.0 g/dL 12.5 13.3 12.9  Hematocrit 36.0 - 46.0 % 38.4 40.4 40.1  Platelets 150 - 400 K/uL 137(L) 185 190   CMP Latest Ref Rng & Units 11/02/2020 10/24/2020 10/13/2020  Glucose 70 - 99 mg/dL 118(H) 117(H) 109(H)  BUN 6 - 20 mg/dL 12 17 11   Creatinine 0.44 -  1.00 mg/dL 0.82 0.75 0.70  Sodium 135 - 145 mmol/L 137 140 137  Potassium 3.5 - 5.1 mmol/L 3.9 3.1(L) 3.3(L)  Chloride 98 - 111 mmol/L 104 106 100  CO2 22 - 32 mmol/L 26 26 27   Calcium 8.9 - 10.3 mg/dL 9.0 9.7 9.3  Total Protein 6.5 - 8.1 g/dL 6.5 7.5 6.8  Total Bilirubin 0.3 - 1.2 mg/dL 0.5 0.6 0.7  Alkaline Phos 38 - 126 U/L 75 91 82  AST 15 - 41 U/L 24 38 36  ALT 0 - 44 U/L 20 31 26     DIAGNOSTIC IMAGING:  I have independently reviewed the scans and discussed with the patient. CT ABDOMEN PELVIS W CONTRAST  Result Date: 10/24/2020 CLINICAL DATA:  50 year old female with abdominal pain. History of metastatic ovarian cancer. EXAM: CT ABDOMEN AND PELVIS WITH CONTRAST TECHNIQUE: Multidetector CT imaging of  the abdomen and pelvis was performed using the standard protocol following bolus administration of intravenous contrast. CONTRAST:  134mL OMNIPAQUE IOHEXOL 300 MG/ML  SOLN COMPARISON:  CT of the abdomen pelvis dated 06/24/2020. FINDINGS: Lower chest: Partially visualized moderate left pleural effusion, increased in size since the prior CT. There is diffuse calcified pleural based nodules and plaques in the left lower lobe as seen on the prior CT. The right lung base is clear. There is coronary vascular calcification. No intra-abdominal free air or free fluid. Hepatobiliary: Heterogeneous appearance of the liver. No intrahepatic biliary ductal dilatation. Similar appearance of a 2 cm hypodense lesion in the left lobe of the liver. Cholecystectomy. Pancreas: Unremarkable. No pancreatic ductal dilatation or surrounding inflammatory changes. Spleen: Normal in size without focal abnormality. Adrenals/Urinary Tract: The adrenal glands unremarkable. There is no hydronephrosis on either side. There is symmetric enhancement and excretion of contrast by both kidneys. The visualized ureters and urinary bladder appear unremarkable. Stomach/Bowel: Postsurgical changes of partial sigmoid colon resection. There is no bowel obstruction. Vascular/Lymphatic: There is advanced calcified and noncalcified plaques of the abdominal aorta. There is noncalcified plaque or mural thrombus with narrowing and irregularity of the aortic lumen. There is a 3.4 cm fusiform infrarenal abdominal aortic aneurysm similar to prior CT. The IVC is unremarkable. No portal venous gas. There is no adenopathy. There is a 10 mm nodular density abutting the colon in the right lower quadrant (61/2) consistent with serosal implant. Similar appearance of scattered omental nodularity in the left upper quadrant. A 3.8 cm soft tissue nodule in the anterior right hemipelvis similar to prior CT. Reproductive: Hysterectomy. Other: Anterior abdominal wall surgical  incision/scar. Musculoskeletal: Degenerative changes of the spine. Sclerotic focus Involving the greater trochanter of the left femur as well as smaller sclerotic lesions of the left femoral neck as seen on the prior CT. no acute osseous pathology. IMPRESSION: 1. No acute intra-abdominal or pelvic pathology. No bowel obstruction. 2. Similar appearance of omental and peritoneal metastatic disease. 3. Partially visualized moderate left pleural effusion, increased in size since the prior CT. 4. Aortic Atherosclerosis (ICD10-I70.0). Electronically Signed   By: Anner Crete M.D.   On: 10/24/2020 22:11     ASSESSMENT:  -06/24/2016: Started Taxol/carboplatin. 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 Taxol discontinued cycle 5. -10/24/2016: Started maintenance letrozole. -March 2020bevacizumab started andcompleted22 cycles until 12/02/2019, discontinued due to TIA and uncontrolled hypertension. -Germline mutation testing reportedly done at [facility] was negative. -She is under the care of Dr. Merla Riches July 2021 -CTAP on 04/03/2020 with stable appearance of calcified omental and mesenteric nodules. Soft tissue fullness in the right  adnexal region is stable. -MRI of the left hip on 04/23/2020 with 18 mm bone lesion in the peripheral superior aspect of the left greater trochanter -XRT to left femur lesion, 30 Gray from 05/06/2020 through 05/20/2020. -CTAP on 06/24/2020 with overall progression of calcified and noncalcified peritoneal surface and omental lesions with slight progression of disease at the left lung base involving the pleura. Stable sclerotic bone lesion involving the left greater trochanter. -3 cycles of carboplatin and gemcitabine from 07/03/2020 through 08/17/2020. -CT CAP on 09/02/2020 showed right hilar lymph node slightly increased in size. Calcified left lower paratracheal lymph node also slightly increased in size. CT of the chest was compared to prior CT scan from 04/03/2020. CTAP  showed hypodense lesion in the dome of the right hepatic lobe is mildly hyperdense measuring 1.1 x 0.8 cm, previously the same. Hypodense lesion in the lateral segment of the left hepatic lobe measures 2 x 1.7 cm, formerly 1.8 x 1.3 cm. -CA-125 has steadily improved.  2. Social/family history: -Lives by herself. Worked as a Land. 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 Quit smoking 2 years ago.  3. Abdominal aneurysm: -CTAP on 06/24/2020 shows stable 3.4 x 3.3 cm infrarenal abdominal aortic aneurysm. -Used to follow with vascular surgery at [facility].  4.  Saddle pulmonary embolism: -CT angiogram of the chest on 10/27/2020 at [facility] showed saddle pulmonary embolus with extension into the right upper lobe, lower lobe and middle lobe pulmonary arteries as well as areas of segmental and subsegmental extension.  Left pulmonary artery competent appears to involve the left main pulmonary artery with areas of segmental and subsegmental extension into the left lower lobe.  Small left pleural effusion.  Findings concerning for right heart strain.  Small left pleural effusion. - Left leg Doppler on 10/27/2020 was negative for thrombosis. - Lovenox followed by Eliquis which was started on 11/02/2020.   PLAN:  1.Stage IVa low-grade ovarian cancer: -She has received single agent carboplatin on 09/22/2020 and 10/13/2020. - CTAP with contrast on 10/24/2020 showed no acute abdominal pathology. - She is still feeling slightly weak from her recent pulmonary embolism.  She is undergoing physical therapy.  She is able to walk with help of walker.  She is doing physical therapy once a week. - Reviewed her labs today which showed normal LFTs and CBC.  CA125 was stable at 218 on 11/02/2020. - I have recommended to holding back on restarting treatment at this time.  We will reevaluate her in 2 weeks.  2. Back pain/neuropathy: -Continue MS Contin 30 mg every 8 hours.  Continue  Dilaudid 4 mg every 4 hours for breakthrough pain. - Continue gabapentin 900 mg 3 times daily. - We have faxed a prescription to [facility] for a compounding cream for her feet neuropathy pain.  3. Hypertension: -Blood pressure today is 114/79.  Recommend holding lisinopril/HCTZ.  4. Nausea/vomiting: -Continue Phenergan and Zofran as needed.  5.  Saddle pulmonary embolism: -She is tolerating Eliquis twice daily very well.  No bleeding issues reported. - Oxygenation is also improving.  She is not requiring oxygen when she is resting and still maintaining her sats very well. - She was told to cut back on oxygen to 2 L/min.  6. Hypokalemia: -Continue potassium 10 mEq 2 tablets daily.  Potassium today is 3.5.      Orders placed this encounter:  No orders of the defined types were placed in this encounter.    Derek Jack, MD Deneise Lever  [place] [phone]   I, Thana Ates, am acting as a scribe for Dr. Derek Jack.  I, Derek Jack MD, have reviewed the above documentation for accuracy and completeness, and I agree with the above.

## 2020-11-18 ENCOUNTER — Inpatient Hospital Stay (HOSPITAL_COMMUNITY): Payer: Medicare Other

## 2020-11-18 ENCOUNTER — Encounter (HOSPITAL_COMMUNITY): Payer: Self-pay

## 2020-11-18 ENCOUNTER — Encounter: Payer: Self-pay | Admitting: Hematology and Oncology

## 2020-11-18 ENCOUNTER — Encounter: Payer: Self-pay | Admitting: Hematology

## 2020-11-18 ENCOUNTER — Inpatient Hospital Stay (HOSPITAL_BASED_OUTPATIENT_CLINIC_OR_DEPARTMENT_OTHER): Payer: Medicare Other | Admitting: Hematology

## 2020-11-18 ENCOUNTER — Inpatient Hospital Stay (HOSPITAL_COMMUNITY): Payer: Medicare Other | Attending: Hematology

## 2020-11-18 ENCOUNTER — Other Ambulatory Visit: Payer: Self-pay

## 2020-11-18 VITALS — BP 114/79 | HR 94 | Temp 97.2°F | Resp 18 | Wt 186.5 lb

## 2020-11-18 DIAGNOSIS — T451X5A Adverse effect of antineoplastic and immunosuppressive drugs, initial encounter: Secondary | ICD-10-CM | POA: Diagnosis not present

## 2020-11-18 DIAGNOSIS — I509 Heart failure, unspecified: Secondary | ICD-10-CM | POA: Insufficient documentation

## 2020-11-18 DIAGNOSIS — Z8673 Personal history of transient ischemic attack (TIA), and cerebral infarction without residual deficits: Secondary | ICD-10-CM | POA: Diagnosis not present

## 2020-11-18 DIAGNOSIS — R112 Nausea with vomiting, unspecified: Secondary | ICD-10-CM | POA: Diagnosis not present

## 2020-11-18 DIAGNOSIS — E876 Hypokalemia: Secondary | ICD-10-CM | POA: Diagnosis not present

## 2020-11-18 DIAGNOSIS — C786 Secondary malignant neoplasm of retroperitoneum and peritoneum: Secondary | ICD-10-CM | POA: Diagnosis not present

## 2020-11-18 DIAGNOSIS — Z87891 Personal history of nicotine dependence: Secondary | ICD-10-CM | POA: Diagnosis not present

## 2020-11-18 DIAGNOSIS — Z79899 Other long term (current) drug therapy: Secondary | ICD-10-CM | POA: Insufficient documentation

## 2020-11-18 DIAGNOSIS — G620 Drug-induced polyneuropathy: Secondary | ICD-10-CM

## 2020-11-18 DIAGNOSIS — G629 Polyneuropathy, unspecified: Secondary | ICD-10-CM | POA: Insufficient documentation

## 2020-11-18 DIAGNOSIS — I700 Atherosclerosis of aorta: Secondary | ICD-10-CM | POA: Diagnosis not present

## 2020-11-18 DIAGNOSIS — I110 Hypertensive heart disease with heart failure: Secondary | ICD-10-CM | POA: Diagnosis not present

## 2020-11-18 DIAGNOSIS — C569 Malignant neoplasm of unspecified ovary: Secondary | ICD-10-CM | POA: Insufficient documentation

## 2020-11-18 DIAGNOSIS — Z86711 Personal history of pulmonary embolism: Secondary | ICD-10-CM | POA: Insufficient documentation

## 2020-11-18 DIAGNOSIS — I714 Abdominal aortic aneurysm, without rupture: Secondary | ICD-10-CM | POA: Insufficient documentation

## 2020-11-18 DIAGNOSIS — Z5111 Encounter for antineoplastic chemotherapy: Secondary | ICD-10-CM | POA: Insufficient documentation

## 2020-11-18 DIAGNOSIS — Z7901 Long term (current) use of anticoagulants: Secondary | ICD-10-CM | POA: Diagnosis not present

## 2020-11-18 DIAGNOSIS — J910 Malignant pleural effusion: Secondary | ICD-10-CM | POA: Diagnosis not present

## 2020-11-18 LAB — COMPREHENSIVE METABOLIC PANEL
ALT: 16 U/L (ref 0–44)
AST: 19 U/L (ref 15–41)
Albumin: 4 g/dL (ref 3.5–5.0)
Alkaline Phosphatase: 74 U/L (ref 38–126)
Anion gap: 10 (ref 5–15)
BUN: 13 mg/dL (ref 6–20)
CO2: 27 mmol/L (ref 22–32)
Calcium: 9.4 mg/dL (ref 8.9–10.3)
Chloride: 99 mmol/L (ref 98–111)
Creatinine, Ser: 0.66 mg/dL (ref 0.44–1.00)
GFR, Estimated: >60 mL/min (ref >60)
Glucose, Bld: 127 mg/dL — ABNORMAL HIGH (ref 70–99)
Potassium: 3.5 mmol/L (ref 3.5–5.1)
Sodium: 136 mmol/L (ref 135–145)
Total Bilirubin: 0.4 mg/dL (ref 0.3–1.2)
Total Protein: 7 g/dL (ref 6.5–8.1)

## 2020-11-18 LAB — VITAMIN D 25 HYDROXY (VIT D DEFICIENCY, FRACTURES): Vit D, 25-Hydroxy: 33.71 ng/mL (ref 30–100)

## 2020-11-18 LAB — CBC WITH DIFFERENTIAL/PLATELET
Abs Immature Granulocytes: 0.02 10*3/uL (ref 0.00–0.07)
Basophils Absolute: 0.1 10*3/uL (ref 0.0–0.1)
Basophils Relative: 1 %
Eosinophils Absolute: 0.1 10*3/uL (ref 0.0–0.5)
Eosinophils Relative: 2 %
HCT: 39.6 % (ref 36.0–46.0)
Hemoglobin: 12.6 g/dL (ref 12.0–15.0)
Immature Granulocytes: 0 %
Lymphocytes Relative: 27 %
Lymphs Abs: 1.6 10*3/uL (ref 0.7–4.0)
MCH: 29.5 pg (ref 26.0–34.0)
MCHC: 31.8 g/dL (ref 30.0–36.0)
MCV: 92.7 fL (ref 80.0–100.0)
Monocytes Absolute: 0.5 10*3/uL (ref 0.1–1.0)
Monocytes Relative: 9 %
Neutro Abs: 3.7 10*3/uL (ref 1.7–7.7)
Neutrophils Relative %: 61 %
Platelets: 240 10*3/uL (ref 150–400)
RBC: 4.27 MIL/uL (ref 3.87–5.11)
RDW: 16.5 % — ABNORMAL HIGH (ref 11.5–15.5)
WBC: 6.1 10*3/uL (ref 4.0–10.5)
nRBC: 0 % (ref 0.0–0.2)

## 2020-11-18 NOTE — Patient Instructions (Signed)
[place] at [facility] Discharge Instructions  You were seen today by Dr. Delton Coombes. He went over your recent results, and you did not receive treatment today. Dr. Delton Coombes will see you back in 2 weeks for labs and follow up.   Thank you for choosing [place] at [facility] to provide your oncology and hematology care.  To afford each patient quality time with our provider, please arrive at least 15 minutes before your scheduled appointment time.   If you have a lab appointment with the [facility] please come in thru the Main Entrance and check in at the main information desk  You need to re-schedule your appointment should you arrive 10 or more minutes late.  We strive to give you quality time with our providers, and arriving late affects you and other patients whose appointments are after yours.  Also, if you no show three or more times for appointments you may be dismissed from the clinic at the providers discretion.     Again, thank you for choosing [facility].  Our hope is that these requests will decrease the amount of time that you wait before being seen by our physicians.       _____________________________________________________________  Should you have questions after your visit to [facility], please contact our office at [phone] between the hours of 8:00 a.m. and 4:30 p.m.  Voicemails left after 4:00 p.m. will not be returned until the following business day.  For prescription refill requests, have your pharmacy contact our office and allow 72 hours.    Cancer Center Support Programs:   > Cancer Support Group  2nd Tuesday of the month 1pm-2pm, Journey Room

## 2020-11-18 NOTE — Progress Notes (Signed)
Treatment held today per MD. Will retry in 2 weeks.

## 2020-11-19 LAB — CA 125: Cancer Antigen (CA) 125: 279 U/mL — ABNORMAL HIGH (ref 0.0–38.1)

## 2020-11-23 ENCOUNTER — Encounter: Payer: Self-pay | Admitting: Hematology and Oncology

## 2020-11-23 ENCOUNTER — Encounter: Payer: Self-pay | Admitting: Hematology

## 2020-11-26 ENCOUNTER — Telehealth (HOSPITAL_COMMUNITY): Payer: Self-pay | Admitting: *Deleted

## 2020-11-26 NOTE — Telephone Encounter (Signed)
Received TC from [place] at [facility] requesting a script to evaluate for conserving device in order for patient to receive a smaller O2 delivery system.  Faxed today.

## 2020-11-29 ENCOUNTER — Other Ambulatory Visit (HOSPITAL_COMMUNITY): Payer: Self-pay | Admitting: Hematology

## 2020-11-30 ENCOUNTER — Encounter: Payer: Self-pay | Admitting: Hematology

## 2020-11-30 ENCOUNTER — Encounter: Payer: Self-pay | Admitting: Hematology and Oncology

## 2020-11-30 ENCOUNTER — Other Ambulatory Visit (HOSPITAL_COMMUNITY): Payer: Self-pay | Admitting: Hematology

## 2020-12-02 NOTE — Progress Notes (Signed)
[facility] [place], [place] [address]   CLINIC:  Medical Oncology/Hematology  PCP:  Derek Jack, [place] / [place] [address] [phone]   REASON FOR VISIT:  Follow-up for ovarian cancer and peritoneal carcinomatosis  PRIOR THERAPY:  1. Exploratory laparotomy, posterior exenteration, argon beam ablation of peritoneum, omentectomy and optimal tumor debulking on 05/05/2016. 2. Carboplatin and paclitaxel x 5 cycles from 06/24/2016. 3. Avastin x 22 cycles through 12/02/2019 discontinued due to TIA and hypertension. 4. Left femur radiation 30 Gy in 10 fractions from 05/07/2020 to 05/20/2020.  NGS Results: not done  CURRENT THERAPY: Carboplatin, gemcitabine and Aloxi every 3 weeks  BRIEF ONCOLOGIC HISTORY:  Oncology History Overview Note  Negative genetics at [facility]   Ovarian cancer ([place])  04/25/2016 Imaging   CT confirms 12cm ovarian masses with extensive peritoneal metastases and malignant pleural effusions   05/05/2016 Surgery   s/p ex-lap posterior exenteration, diaphragm stripping, cholecystectomy, omentectomy, appendectomy and optimal tumor debulking on 05/05/16   06/24/2016 - 10/24/2016 Chemotherapy   Started Taxol/Carboplatin. 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  Numerous left predominant calcified pleural metastases and associated small left pleural effusion are unchanged.  2.  Unchanged (3.0 cm) (series 3, image 96) left breast mass    02/07/2019 Imaging   Outside Ct abdomen and pelvis 1. Overall stable  small volume peritoneal tumor, as described above.  2. Unchanged infrarenal abdominal aortic aneurysm.    02/22/2019 Tumor Marker   Patient's tumor was tested for the following markers: CA-125 Results of the tumor marker test revealed 164   03/18/2019 Tumor Marker   Patient's tumor was tested for the following markers: CA-125 Results of the tumor marker test revealed 162   04/08/2019 Tumor Marker   Patient's tumor was tested for the following markers: CA-125 Results of the tumor marker test revealed 164   04/29/2019 Tumor Marker   Patient's tumor was tested for the following markers: CA-125 Results of the tumor marker test revealed 181   05/20/2019 Tumor Marker   Patient's tumor was tested for the following markers: CA-125 Results of the tumor marker test revealed 164   06/10/2019 Tumor Marker   Patient's tumor was tested for the following markers: CA-125 Results of the tumor marker test revealed 152   07/01/2019 Tumor Marker   Patient's tumor was tested for the following markers: CA-125 Results of the tumor marker test revealed 160   07/22/2019 Tumor Marker   Patient's tumor was tested for the following markers: CA-125 Results of the tumor marker test revealed 171   08/12/2019 Tumor Marker   Patient's tumor was tested for the following markers: CA-125 Results of the tumor marker test revealed 173   09/02/2019 Tumor Marker   Patient's tumor was tested for the following markers: CA-125 Results of the tumor marker test revealed 159   09/30/2019 Tumor Marker   Patient's tumor was tested for the following markers: CA-125 Results of the tumor marker test revealed 168   11/08/2019 Imaging   Outside CT imaging 1. Study  was performed at outside institution on 11/08/2019 and presented for review on 11/14/2019.  2. Stable calcified and noncalcified left-sided nodular and plaque-like pleural abnormalities with small chronic left pleural effusion compatible with stable treated pleural  metastasis. No new or enlarging lesions.  3. No enlarging thoracic lymph nodes.  4. Redemonstration of left breast mass. Correlation with mammography is suggested if not already performed   12/02/2019 Tumor Marker   Patient's tumor was tested for the following markers: CA-125 Results of the tumor marker test revealed 186   12/18/2019 Cancer Staging   Staging form: Ovary, Fallopian Tube, and Primary Peritoneal Carcinoma, AJCC 8th Edition - Clinical stage from 12/18/2019: FIGO Stage IV (rcT3c, cN0, cM1) - Signed by [name], MD on 04/24/2020    01/09/2020 Tumor Marker   Patient's tumor was tested for the following markers: CA-125 Results of the tumor marker test revealed 184   01/10/2020 Imaging   1. Treated left pleural and peritoneal metastatic disease. Areas of residual soft tissue prominence in the right anatomic pelvis are indeterminate. Comparison with prior exams would be helpful. 2. Hepatomegaly. Low-attenuation lesions in the liver are too small to characterize. Comparison with prior exams would be helpful. 3. Small left fibrothorax. 4. Infrarenal Aortic aneurysm NOS (ICD10-I71.9). Recommend followup by ultrasound in 2 years.  5. Aortic atherosclerosis (ICD10-I70.0). Coronary artery calcification.   03/09/2020 Tumor Marker   Patient's tumor was tested for the following markers: CA-125 Results of the tumor marker test revealed 214   04/01/2020 Tumor Marker   Patient's tumor was tested for the following markers: CA-125 Results of the tumor marker test revealed 245.   04/03/2020 Imaging   1. No acute findings in the abdomen or pelvis. Specifically, no findings to explain the patient's history of left upper quadrant pain with nausea and vomiting. 2. Stable appearance of calcified omental and mesenteric nodules consistent with treated metastatic disease. Soft tissue fullness seen in the right adnexal region on the prior exam is similar today. 3. Stable 1.6 x 1.2 cm low-density lesion  lateral segment left liver. 4. 3.5 cm infrarenal abdominal aortic aneurysm. Recommend follow-up ultrasound every 2 years. This recommendation follows ACR consensus guidelines: White Paper of the ACR Incidental Findings Committee II on Vascular Findings. J Am Coll Radiol 2013; 16:109-604. 5. Aortic Atherosclerosis (ICD10-I70.0).   04/15/2020 Imaging   Focus of abnormal uptake is seen involving the greater trochanter of the proximal left femur which corresponds to sclerotic density seen on prior CT scan and is concerning for metastatic disease.   04/23/2020 Imaging   1. No hip fracture, dislocation or avascular necrosis. 2. A 18 mm bone lesion in the peripheral superior aspect of the left greater trochanter with enhancement on postcontrast imaging. The overall appearance is most concerning for metastatic disease in the setting of known malignancy. 3. 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 4. Degenerative disease with disc height loss at L4-5 and L5-S1 with bilateral facet arthropathy.   06/08/2020 Tumor Marker   Patient's tumor was tested for the following markers: CA-125. Results of the tumor marker test revealed 272.   06/24/2020 Imaging   1. Overall progression of calcified and noncalcified peritoneal surface and omental lesions. 2. Slight progression of disease at the left lung base and involving the pleura. 3. Stable sclerotic bone lesion involving the left greater trochanter. 4. Stable infrarenal abdominal aortic aneurysm.   07/03/2020 -  Chemotherapy    Patient is on Treatment Plan: OVARIAN RECURRENT 3RD LINE CARBOPLATIN D1 / GEMCITABINE D1,8 (  4/800) Q21D       07/03/2020 Tumor Marker   Patient's tumor was tested for the following markers: CA-125 Results of the tumor marker test revealed 285.   07/10/2020 Tumor Marker   Patient's tumor was tested for the following markers: CA-125 Results of the tumor marker test revealed 332   07/23/2020 Tumor Marker   Patient's tumor was tested  for the following markers: CA-125 Results of the tumor marker test revealed 308     CANCER STAGING: Cancer Staging Ovarian cancer [facility]) Staging form: Ovary, Fallopian Tube, and Primary Peritoneal Carcinoma, AJCC 8th Edition - Clinical stage from 12/18/2019: FIGO Stage IV (rcT3c, cN0, cM1) - Signed by [name], MD on 04/24/2020   INTERVAL HISTORY:  Heidi Johnson, a 50 y.o. female, returns for routine follow-up and consideration for next cycle of chemotherapy. Heidi Johnson was last seen on 11/18/2020.  Due for cycle #6 of Carboplatin/Gemcitabine today.   Overall, she tells me she has been feeling pretty well. She reports reduced physical activities. She is taking potassium, Eliquis, and zofran and tolerating them well. She complains of continued pain in her lower legs. She is currently on 3L O2. When she is sitting and remove the nasal cannula her O2 sat stays above 91. She had no been taking olanzapine.   Overall, she feels ready for next cycle of chemo today.   REVIEW OF SYSTEMS:  Review of Systems  Constitutional:  Positive for appetite change (0%) and fatigue (depleted).  Cardiovascular:  Positive for leg swelling.  Gastrointestinal:  Positive for constipation, diarrhea, nausea and vomiting.  Musculoskeletal:  Positive for back pain (7/10) and myalgias (lower legs 7/10).  All other systems reviewed and are negative.  PAST MEDICAL/SURGICAL HISTORY:  Past Medical History:  Diagnosis Date   Cancer ([place])    ovarian cancer   CHF (congestive heart failure) (HCC)    Hypertension    Panic attacks    Stroke [facility])    Past Surgical History:  Procedure Laterality Date   ABDOMINAL HYSTERECTOMY     BACK SURGERY     TUBAL LIGATION      SOCIAL HISTORY:  Social History   Socioeconomic History   Marital status: Divorced    Spouse name: Not on file   Number of children: 3   Years of education: Not on file   Highest education level: Not on file  Occupational History    Occupation: retired  Tobacco Use   Smoking status: Former    Pack years: 0.00    Types: Cigarettes    Quit date: 06/20/2018    Years since quitting: 2.4   Smokeless tobacco: Never  Vaping Use   Vaping Use: Never used  Substance and Sexual Activity   Alcohol use: No   Drug use: Yes    Types: Marijuana   Sexual activity: Yes  Other Topics Concern   Not on file  Social History Narrative   Lives alone   Social Determinants of Health   Financial Resource Strain: High Risk   Difficulty of Paying Living Expenses: Hard  Food Insecurity: No Food Insecurity   Worried About Running Out of Food in the Last Year: Never true   Ran Out of Food in the Last Year: Never true  Transportation Needs: No Transportation Needs   Lack of Transportation (Medical): No   Lack of Transportation (Non-Medical): No  Physical Activity: Inactive   Days of Exercise per Week: 0 days   Minutes of Exercise per Session: 0 min  Stress: No Stress Concern Present   Feeling of Stress : Only a little  Social Connections: Moderately Isolated   Frequency of Communication with Friends and Family: Twice a week   Frequency of Social Gatherings with Friends and Family: Twice a week   Attends Religious Services: 1 to 4 times per year   Active Member of [employer] or Organizations: No   Attends Music therapist: Never   Marital Status: Divorced  Human resources officer Violence: Not At Risk   Fear of Current or Ex-Partner: No   Emotionally Abused: No   Physically Abused: No   Sexually Abused: No    FAMILY HISTORY:  Family History  Problem Relation Age of Onset   Cancer Maternal Aunt        breast ca/GYN    CURRENT MEDICATIONS:  Current Outpatient Medications  Medication Sig Dispense Refill   albuterol (VENTOLIN HFA) 108 (90 Base) MCG/ACT inhaler Inhale 2 puffs into the lungs as needed. 2 puffs into lungs every 6 hours as needed for wheezing or SOB     apixaban (ELIQUIS) 5 MG TABS tablet Take 1 tablet (5 mg  total) by mouth 2 (two) times daily. 60 tablet 5   aspirin 325 MG EC tablet Take 325 mg by mouth daily.     clonazePAM (KLONOPIN) 1 MG tablet Take 1 mg by mouth 3 (three) times daily as needed for anxiety.     cyclobenzaprine (FLEXERIL) 5 MG tablet Take 1 tablet by mouth 3 (three) times daily as needed.     docusate sodium (COLACE) 100 MG capsule Take 100 mg by mouth 2 (two) times daily.     dronabinol (MARINOL) 5 MG capsule Take 1 capsule (5 mg total) by mouth 2 (two) times daily before lunch and supper. 60 capsule 1   ergocalciferol (VITAMIN D2) 1.25 MG (50000 UT) capsule Take 1 capsule (50,000 Units total) by mouth once a week. 12 capsule 1   furosemide (LASIX) 20 MG tablet TAKE 2 TABLETS (40 MG TOTAL) BY MOUTH DAILY AS NEEDED FOR FLUID OR EDEMA. 60 tablet 1   gabapentin (NEURONTIN) 300 MG capsule Take 3 capsules (900 mg total) by mouth 3 (three) times daily. 270 capsule 4   hydrochlorothiazide (HYDRODIURIL) 25 MG tablet Take 25 mg by mouth daily.     HYDROmorphone (DILAUDID) 4 MG tablet Take 1 tablet (4 mg total) by mouth every 4 (four) hours as needed for severe pain. 180 tablet 0   ibuprofen (ADVIL) 600 MG tablet Take 600 mg by mouth every 6 (six) hours as needed.     lisinopril-hydrochlorothiazide (ZESTORETIC) 20-12.5 MG tablet Take 1 tablet by mouth 2 (two) times daily.     loperamide (IMODIUM) 2 MG capsule Take 1 capsule (2 mg total) by mouth as needed for diarrhea or loose stools. 60 capsule 3   MARINOL 5 MG capsule Take 1 capsule (5 mg total) by mouth 2 (two) times daily before lunch and supper. 60 capsule 3   morphine (MS CONTIN) 30 MG 12 hr tablet Take 1 tablet (30 mg total) by mouth every 8 (eight) hours. 90 tablet 0   naloxone (NARCAN) 2 MG/2ML injection Place 2 mg into the nose as needed.      OLANZapine (ZYPREXA) 10 MG tablet Take 10 mg by mouth at bedtime.     ondansetron (ZOFRAN) 8 MG tablet Take 1 tablet (8 mg total) by mouth every 8 (eight) hours as needed for nausea or  vomiting. 60 tablet 0   ondansetron (  ZOFRAN-ODT) 8 MG disintegrating tablet Take 1 tablet (8 mg total) by mouth every 8 (eight) hours as needed for nausea or vomiting. 60 tablet 1   polyethylene glycol (MIRALAX / GLYCOLAX) 17 g packet Take 17 g by mouth 2 (two) times daily. 100 each 11   potassium chloride (KLOR-CON M10) 10 MEQ tablet Take 2 tablets (20 mEq total) by mouth daily. 60 tablet 2   prochlorperazine (COMPAZINE) 10 MG tablet Take 1 tablet (10 mg total) by mouth every 6 (six) hours as needed (Nausea or vomiting). 30 tablet 1   promethazine (PHENERGAN) 12.5 MG tablet Take 1 tablet (12.5 mg total) by mouth every 6 (six) hours as needed for nausea or vomiting. 60 tablet 3   rosuvastatin (CRESTOR) 40 MG tablet Take 40 mg by mouth daily.     senna (SENOKOT) 8.6 MG tablet Take 2 tablets (17.2 mg total) by mouth 3 (three) times daily. 90 tablet 1   ZOLOFT 50 MG tablet Take 1 tablet by mouth as directed  take #1/2 tablet daily x one week then take #1 tablet daily     No current facility-administered medications for this visit.   Facility-Administered Medications Ordered in Other Visits  Medication Dose Route Frequency Provider Last Rate Last Admin   heparin lock flush 100 unit/mL  500 Units Intracatheter Once [name], MD        ALLERGIES:  Allergies  Allergen Reactions   Latex Rash and Hives   Sulfamethoxazole-Trimethoprim Other (See Comments)    Reaction:  Unknown  Other reaction(s): Unknown When 45, dr told her not to take    PHYSICAL EXAM:  Performance status (ECOG): 1 - Symptomatic but completely ambulatory  There were no vitals filed for this visit. Wt Readings from Last 3 Encounters:  11/18/20 186 lb 8.2 oz (84.6 kg)  11/02/20 187 lb (84.8 kg)  10/24/20 190 lb (86.2 kg)   Physical Exam Vitals reviewed.  Constitutional:      Appearance: Normal appearance.     Interventions: Nasal cannula in place.  Cardiovascular:     Rate and Rhythm: Normal rate and regular  rhythm.     Pulses: Normal pulses.     Heart sounds: Normal heart sounds.  Pulmonary:     Effort: Pulmonary effort is normal.     Breath sounds: Normal breath sounds.  Abdominal:     Palpations: Abdomen is soft. There is no hepatomegaly, splenomegaly or mass.     Tenderness: There is no abdominal tenderness.  Neurological:     General: No focal deficit present.     Mental Status: She is alert and oriented to person, place, and time.  Psychiatric:        Mood and Affect: Mood normal.        Behavior: Behavior normal.    LABORATORY DATA:  I have reviewed the labs as listed.  CBC Latest Ref Rng & Units 11/18/2020 11/02/2020 10/24/2020  WBC 4.0 - 10.5 K/uL 6.1 4.1 9.2  Hemoglobin 12.0 - 15.0 g/dL 12.6 12.5 13.3  Hematocrit 36.0 - 46.0 % 39.6 38.4 40.4  Platelets 150 - 400 K/uL 240 137(L) 185   CMP Latest Ref Rng & Units 11/18/2020 11/02/2020 10/24/2020  Glucose 70 - 99 mg/dL 127(H) 118(H) 117(H)  BUN 6 - 20 mg/dL 13 12 17   Creatinine 0.44 - 1.00 mg/dL 0.66 0.82 0.75  Sodium 135 - 145 mmol/L 136 137 140  Potassium 3.5 - 5.1 mmol/L 3.5 3.9 3.1(L)  Chloride 98 - 111 mmol/L 99  104 106  CO2 22 - 32 mmol/L 27 26 26   Calcium 8.9 - 10.3 mg/dL 9.4 9.0 9.7  Total Protein 6.5 - 8.1 g/dL 7.0 6.5 7.5  Total Bilirubin 0.3 - 1.2 mg/dL 0.4 0.5 0.6  Alkaline Phos 38 - 126 U/L 74 75 91  AST 15 - 41 U/L 19 24 38  ALT 0 - 44 U/L 16 20 31     DIAGNOSTIC IMAGING:  I have independently reviewed the scans and discussed with the patient. No results found.   ASSESSMENT:  -06/24/2016: Started Taxol/carboplatin.  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  Taxol discontinued cycle 5. -10/24/2016: Started maintenance letrozole. -March 2020 bevacizumab started and completed 22 cycles until 12/02/2019, discontinued due to TIA and uncontrolled hypertension. -Germline mutation testing reportedly done at [facility] was negative. -She is under the care of Dr. Alvy Bimler from July 2021 -CTAP on 04/03/2020 with stable  appearance of calcified omental and mesenteric nodules.  Soft tissue fullness in the right adnexal region is stable. -MRI of the left hip on 04/23/2020 with 18 mm bone lesion in the peripheral superior aspect of the left greater trochanter -XRT to left femur lesion, 30 Gray from 05/06/2020 through 05/20/2020. -CTAP on 06/24/2020 with overall progression of calcified and noncalcified peritoneal surface and omental lesions with slight progression of disease at the left lung base involving the pleura.  Stable sclerotic bone lesion involving the left greater trochanter. -3 cycles of carboplatin and gemcitabine from 07/03/2020 through 08/17/2020. -CT CAP on 09/02/2020 showed right hilar lymph node slightly increased in size.  Calcified left lower paratracheal lymph node also slightly increased in size.  CT of the chest was compared to prior CT scan from 04/03/2020.  CTAP showed hypodense lesion in the dome of the right hepatic lobe is mildly hyperdense measuring 1.1 x 0.8 cm, previously the same.  Hypodense lesion in the lateral segment of the left hepatic lobe measures 2 x 1.7 cm, formerly 1.8 x 1.3 cm. -CA-125 has steadily improved.   2.  Social/family history: -Lives by herself.  Worked as a Land.  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  Quit smoking 2 years ago.   3.  Abdominal aneurysm: -CTAP on 06/24/2020 shows stable 3.4 x 3.3 cm infrarenal abdominal aortic aneurysm. -Used to follow with vascular surgery at [facility].   4.  Saddle pulmonary embolism: -CT angiogram of the chest on 10/27/2020 at [facility] showed saddle pulmonary embolus with extension into the right upper lobe, lower lobe and middle lobe pulmonary arteries as well as areas of segmental and subsegmental extension.  Left pulmonary artery competent appears to involve the left main pulmonary artery with areas of segmental and subsegmental extension into the left lower lobe.  Small left pleural effusion.  Findings  concerning for right heart strain.  Small left pleural effusion. - Left leg Doppler on 10/27/2020 was negative for thrombosis. - Lovenox followed by Eliquis which was started on 11/02/2020.   PLAN:  1.  Stage IVa low-grade ovarian cancer: - We have held her treatment for the last month because of her pulmonary embolism.  Last CA125 was increased to 79 on 11/18/2020. - Reviewed her labs today which showed normal LFTs.  Creatinine was at baseline.  CBC was normal. - We will start her back on single agent carboplatin.  She will return to clinic in 3 weeks for follow-up. - I plan to repeat her scan after 2 cycles of carboplatin.   2.  Back  pain/neuropathy: - Continue MS Contin 30 mg every 8 hours.  Continue Dilaudid 4 mg every 4 hours. - Continue gabapentin 900 mg 3 times daily.  Prescription for compound cream is not covered by insurance.   3.  Hypertension: - Blood pressure today is 112/77.  Recommend holding lisinopril/HCTZ.   4.  Nausea/vomiting: - Continue Phenergan and Zofran as needed.   5.  Saddle pulmonary embolism: - She is continuing Eliquis twice daily without any major problems. - Continue oxygen 3 L/min on exertion.   6.  Hypokalemia: - Continue potassium 10 mEq 2 tablets daily.  Potassium today is 3.3.  7.  Hypomagnesemia: - Magnesium today is 1.3.  We will start her on magnesium 1 tablet 3 times daily.  She will receive IV magnesium today.  Addendum: - After the completion of chemotherapy she had nausea.  She was given Compazine.  This was followed by Ativan half milligram as Compazine did not improve it completely.   Orders placed this encounter:  No orders of the defined types were placed in this encounter.    Derek Jack, MD [place] [phone]   I, Thana Ates, am acting as a scribe for Dr. Derek Jack.  I, Derek Jack MD, have reviewed the above documentation for accuracy and completeness, and I agree with the  above.

## 2020-12-03 ENCOUNTER — Inpatient Hospital Stay (HOSPITAL_BASED_OUTPATIENT_CLINIC_OR_DEPARTMENT_OTHER): Payer: Medicare Other | Admitting: Hematology

## 2020-12-03 ENCOUNTER — Inpatient Hospital Stay (HOSPITAL_COMMUNITY): Payer: Medicare Other

## 2020-12-03 ENCOUNTER — Other Ambulatory Visit: Payer: Self-pay

## 2020-12-03 VITALS — BP 137/84 | HR 84 | Temp 97.0°F | Resp 18

## 2020-12-03 VITALS — BP 112/77 | HR 90 | Temp 97.0°F | Resp 18 | Wt 189.2 lb

## 2020-12-03 DIAGNOSIS — G620 Drug-induced polyneuropathy: Secondary | ICD-10-CM

## 2020-12-03 DIAGNOSIS — T451X5A Adverse effect of antineoplastic and immunosuppressive drugs, initial encounter: Secondary | ICD-10-CM | POA: Diagnosis not present

## 2020-12-03 DIAGNOSIS — Z5111 Encounter for antineoplastic chemotherapy: Secondary | ICD-10-CM | POA: Diagnosis not present

## 2020-12-03 DIAGNOSIS — C786 Secondary malignant neoplasm of retroperitoneum and peritoneum: Secondary | ICD-10-CM

## 2020-12-03 DIAGNOSIS — C569 Malignant neoplasm of unspecified ovary: Secondary | ICD-10-CM

## 2020-12-03 DIAGNOSIS — Z7189 Other specified counseling: Secondary | ICD-10-CM

## 2020-12-03 LAB — COMPREHENSIVE METABOLIC PANEL
ALT: 16 U/L (ref 0–44)
AST: 22 U/L (ref 15–41)
Albumin: 3.7 g/dL (ref 3.5–5.0)
Alkaline Phosphatase: 83 U/L (ref 38–126)
Anion gap: 8 (ref 5–15)
BUN: 15 mg/dL (ref 6–20)
CO2: 27 mmol/L (ref 22–32)
Calcium: 8.8 mg/dL — ABNORMAL LOW (ref 8.9–10.3)
Chloride: 98 mmol/L (ref 98–111)
Creatinine, Ser: 0.73 mg/dL (ref 0.44–1.00)
GFR, Estimated: >60 mL/min (ref >60)
Glucose, Bld: 187 mg/dL — ABNORMAL HIGH (ref 70–99)
Potassium: 3.3 mmol/L — ABNORMAL LOW (ref 3.5–5.1)
Sodium: 133 mmol/L — ABNORMAL LOW (ref 135–145)
Total Bilirubin: 0.3 mg/dL (ref 0.3–1.2)
Total Protein: 6.9 g/dL (ref 6.5–8.1)

## 2020-12-03 LAB — CBC WITH DIFFERENTIAL/PLATELET
Abs Immature Granulocytes: 0.02 10*3/uL (ref 0.00–0.07)
Basophils Absolute: 0.1 10*3/uL (ref 0.0–0.1)
Basophils Relative: 1 %
Eosinophils Absolute: 0.1 10*3/uL (ref 0.0–0.5)
Eosinophils Relative: 2 %
HCT: 38.1 % (ref 36.0–46.0)
Hemoglobin: 12.4 g/dL (ref 12.0–15.0)
Immature Granulocytes: 0 %
Lymphocytes Relative: 24 %
Lymphs Abs: 1.3 10*3/uL (ref 0.7–4.0)
MCH: 29.9 pg (ref 26.0–34.0)
MCHC: 32.5 g/dL (ref 30.0–36.0)
MCV: 91.8 fL (ref 80.0–100.0)
Monocytes Absolute: 0.5 10*3/uL (ref 0.1–1.0)
Monocytes Relative: 10 %
Neutro Abs: 3.5 10*3/uL (ref 1.7–7.7)
Neutrophils Relative %: 63 %
Platelets: 183 10*3/uL (ref 150–400)
RBC: 4.15 MIL/uL (ref 3.87–5.11)
RDW: 16 % — ABNORMAL HIGH (ref 11.5–15.5)
WBC: 5.5 10*3/uL (ref 4.0–10.5)
nRBC: 0 % (ref 0.0–0.2)

## 2020-12-03 LAB — MAGNESIUM: Magnesium: 1.3 mg/dL — ABNORMAL LOW (ref 1.7–2.4)

## 2020-12-03 MED ORDER — LORAZEPAM 2 MG/ML IJ SOLN
INTRAMUSCULAR | Status: AC
  Filled 2020-12-03: qty 1

## 2020-12-03 MED ORDER — MAGNESIUM SULFATE 2 GM/50ML IV SOLN
INTRAVENOUS | Status: AC
  Filled 2020-12-03: qty 50

## 2020-12-03 MED ORDER — MAGNESIUM SULFATE 2 GM/50ML IV SOLN
2.0000 g | Freq: Once | INTRAVENOUS | Status: AC
  Administered 2020-12-03: 2 g via INTRAVENOUS

## 2020-12-03 MED ORDER — SODIUM CHLORIDE 0.9 % IV SOLN
150.0000 mg | Freq: Once | INTRAVENOUS | Status: AC
  Administered 2020-12-03: 150 mg via INTRAVENOUS
  Filled 2020-12-03: qty 150

## 2020-12-03 MED ORDER — DIPHENHYDRAMINE HCL 50 MG/ML IJ SOLN
50.0000 mg | Freq: Once | INTRAMUSCULAR | Status: AC
Start: 2020-12-03 — End: 2020-12-03
  Administered 2020-12-03: 50 mg via INTRAVENOUS

## 2020-12-03 MED ORDER — CARBOPLATIN CHEMO INJECTION 600 MG/60ML
564.8000 mg | Freq: Once | INTRAVENOUS | Status: AC
  Administered 2020-12-03: 560 mg via INTRAVENOUS
  Filled 2020-12-03: qty 56

## 2020-12-03 MED ORDER — PROCHLORPERAZINE EDISYLATE 10 MG/2ML IJ SOLN
INTRAMUSCULAR | Status: AC
  Filled 2020-12-03: qty 2

## 2020-12-03 MED ORDER — FAMOTIDINE 20 MG IN NS 100 ML IVPB
20.0000 mg | Freq: Once | INTRAVENOUS | Status: AC
  Administered 2020-12-03: 20 mg via INTRAVENOUS
  Filled 2020-12-03: qty 20

## 2020-12-03 MED ORDER — ONDANSETRON HCL 8 MG PO TABS: 8.0000 mg | ORAL_TABLET | Freq: Three times a day (TID) | ORAL | 3 refills | Status: DC | PRN

## 2020-12-03 MED ORDER — LORAZEPAM 2 MG/ML IJ SOLN
0.5000 mg | Freq: Once | INTRAMUSCULAR | Status: AC
  Administered 2020-12-03: 0.5 mg via INTRAVENOUS

## 2020-12-03 MED ORDER — SODIUM CHLORIDE 0.9 % IV SOLN
10.0000 mg | Freq: Once | INTRAVENOUS | Status: AC
  Administered 2020-12-03: 10 mg via INTRAVENOUS
  Filled 2020-12-03: qty 10

## 2020-12-03 MED ORDER — PALONOSETRON HCL INJECTION 0.25 MG/5ML
0.2500 mg | Freq: Once | INTRAVENOUS | Status: AC
  Administered 2020-12-03: 0.25 mg via INTRAVENOUS

## 2020-12-03 MED ORDER — SODIUM CHLORIDE 0.9% FLUSH
10.0000 mL | INTRAVENOUS | Status: DC | PRN
  Administered 2020-12-03: 10 mL

## 2020-12-03 MED ORDER — MAGNESIUM OXIDE -MG SUPPLEMENT 400 (240 MG) MG PO TABS: 400.0000 mg | ORAL_TABLET | Freq: Three times a day (TID) | ORAL | 3 refills | Status: DC

## 2020-12-03 MED ORDER — DIPHENHYDRAMINE HCL 50 MG/ML IJ SOLN
INTRAMUSCULAR | Status: AC
  Filled 2020-12-03: qty 1

## 2020-12-03 MED ORDER — PROCHLORPERAZINE EDISYLATE 10 MG/2ML IJ SOLN
10.0000 mg | Freq: Once | INTRAMUSCULAR | Status: AC
  Administered 2020-12-03: 10 mg via INTRAVENOUS

## 2020-12-03 MED ORDER — HEPARIN SOD (PORK) LOCK FLUSH 100 UNIT/ML IV SOLN
500.0000 [IU] | Freq: Once | INTRAVENOUS | Status: AC | PRN
  Administered 2020-12-03: 500 [IU]

## 2020-12-03 MED ORDER — SODIUM CHLORIDE 0.9 % IV SOLN: Freq: Once | INTRAVENOUS | Status: AC

## 2020-12-03 MED ORDER — PALONOSETRON HCL INJECTION 0.25 MG/5ML
INTRAVENOUS | Status: AC
  Filled 2020-12-03: qty 5

## 2020-12-03 NOTE — Patient Instructions (Addendum)
[facility] at [facility] Discharge Instructions  You were seen today by Dr. Katragadda. He went over your recent results, and you received your treatment. Dr. Katragadda will see you back in 3 weeks for labs and follow up.   Thank you for choosing [facility] at [facility] to provide your oncology and hematology care.  To afford each patient quality time with our provider, please arrive at least 15 minutes before your scheduled appointment time.   If you have a lab appointment with the Cancer Center please come in thru the Main Entrance and check in at the main information desk  You need to re-schedule your appointment should you arrive 10 or more minutes late.  We strive to give you quality time with our providers, and arriving late affects you and other patients whose appointments are after yours.  Also, if you no show three or more times for appointments you may be dismissed from the clinic at the providers discretion.     Again, thank you for choosing [facility].  Our hope is that these requests will decrease the amount of time that you wait before being seen by our physicians.       _____________________________________________________________  Should you have questions after your visit to  Cancer Center, please contact our office at [phone] between the hours of 8:00 a.m. and 4:30 p.m.  Voicemails left after 4:00 p.m. will not be returned until the following business day.  For prescription refill requests, have your pharmacy contact our office and allow 72 hours.    Cancer Center Support Programs:   > Cancer Support Group  2nd Tuesday of the month 1pm-2pm, Journey Room   

## 2020-12-03 NOTE — Patient Instructions (Signed)
[place]  Discharge Instructions: Thank you for choosing [facility] to provide your oncology and hematology care.  If you have a lab appointment with the [facility], please come in thru the Main Entrance and check in at the main information desk.  Wear comfortable clothing and clothing appropriate for easy access to any Portacath or PICC line.   We strive to give you quality time with your provider. You may need to reschedule your appointment if you arrive late (15 or more minutes).  Arriving late affects you and other patients whose appointments are after yours.  Also, if you miss three or more appointments without notifying the office, you may be dismissed from the clinic at the provider's discretion.      For prescription refill requests, have your pharmacy contact our office and allow 72 hours for refills to be completed.    Today you received the following chemotherapy and/or immunotherapy agents Carboplatin infusion      To help prevent nausea and vomiting after your treatment, we encourage you to take your nausea medication as directed.  BELOW ARE SYMPTOMS THAT SHOULD BE REPORTED IMMEDIATELY: *FEVER GREATER THAN 100.4 F (38 C) OR HIGHER *CHILLS OR SWEATING *NAUSEA AND VOMITING THAT IS NOT CONTROLLED WITH YOUR NAUSEA MEDICATION *UNUSUAL SHORTNESS OF BREATH *UNUSUAL BRUISING OR BLEEDING *URINARY PROBLEMS (pain or burning when urinating, or frequent urination) *BOWEL PROBLEMS (unusual diarrhea, constipation, pain near the anus) TENDERNESS IN MOUTH AND THROAT WITH OR WITHOUT PRESENCE OF ULCERS (sore throat, sores in mouth, or a toothache) UNUSUAL RASH, SWELLING OR PAIN  UNUSUAL VAGINAL DISCHARGE OR ITCHING   Items with * indicate a potential emergency and should be followed up as soon as possible or go to the Emergency Department if any problems should occur.  Please show the CHEMOTHERAPY ALERT CARD or IMMUNOTHERAPY ALERT CARD at check-in to the  Emergency Department and triage nurse.  Should you have questions after your visit or need to cancel or reschedule your appointment, please contact [facility] [phone]  and follow the prompts.  Office hours are 8:00 a.m. to 4:30 p.m. Monday - Friday. Please note that voicemails left after 4:00 p.m. may not be returned until the following business day.  We are closed weekends and major holidays. You have access to a nurse at all times for urgent questions. Please call the main number to the clinic [phone] and follow the prompts.  For any non-urgent questions, you may also contact your provider using MyChart. We now offer e-Visits for anyone 104 and older to request care online for non-urgent symptoms. For details visit [email or web address].   Also download the MyChart app! Go to the app store, search "MyChart", open the app, select [facility], and log in with your MyChart username and password.  Due to Covid, a mask is required upon entering the hospital/clinic. If you do not have a mask, one will be given to you upon arrival. For doctor visits, patients may have 1 support person aged 8 or older with them. For treatment visits, patients cannot have anyone with them due to current Covid guidelines and our immunocompromised population.

## 2020-12-03 NOTE — Progress Notes (Signed)
Patient presents today for Carboplatin infusion per providers orders.  Vital signs within parameters for treatment.  Labs pending.  Patient complains today of leg swelling and fatigue  Labs reviewed by Dr. Delton Coombes, message from [facility] RN/Dr.Katragadda, patient okay for treatment.  Patient to receive 1 gram of magnesium IV today per MD order.  Carboplatin/magnesium given today per MD orders.  Patient had some nausea at the end of the infusion, MD was notified and order for Compazine was given.  Patient then started complaining that she was hot allover and that she was still nauseated.  Verbal order for  0.5 mg of Ativan given.  MD to go see patient.  Vital signs stable.  No complaints at this time.  Nausea better per patient.  Discharge from clinic via wheelchair in stable condition.  Alert and oriented X 3.  Follow up with [facility] as scheduled.

## 2020-12-03 NOTE — Progress Notes (Signed)
Patient has been assessed, vital signs and labs have been reviewed by Dr. Delton Coombes. ANC, Creatinine, LFTs, and Platelets are within treatment parameters per Dr. Delton Coombes. The patient is good to proceed with treatment at this time.  Will add Mag 2 gm IV with treatment today. Primary RN and pharmacy aware.

## 2020-12-04 ENCOUNTER — Other Ambulatory Visit: Payer: Self-pay | Admitting: Hematology and Oncology

## 2020-12-04 LAB — CA 125: Cancer Antigen (CA) 125: 310 U/mL — ABNORMAL HIGH (ref 0.0–38.1)

## 2020-12-08 ENCOUNTER — Encounter (HOSPITAL_COMMUNITY): Payer: Self-pay

## 2020-12-08 ENCOUNTER — Other Ambulatory Visit (HOSPITAL_COMMUNITY): Payer: Self-pay

## 2020-12-08 DIAGNOSIS — C786 Secondary malignant neoplasm of retroperitoneum and peritoneum: Secondary | ICD-10-CM

## 2020-12-08 DIAGNOSIS — C569 Malignant neoplasm of unspecified ovary: Secondary | ICD-10-CM

## 2020-12-08 NOTE — Progress Notes (Signed)
Patient requested letter of medical necessity for lighter weight oxygen tank be faxed to [facility]. This has been faxed at her request.

## 2020-12-09 ENCOUNTER — Encounter: Payer: Self-pay | Admitting: Hematology

## 2020-12-09 ENCOUNTER — Encounter: Payer: Self-pay | Admitting: Hematology and Oncology

## 2020-12-09 MED ORDER — MORPHINE SULFATE ER 30 MG PO TBCR: 30.0000 mg | EXTENDED_RELEASE_TABLET | Freq: Three times a day (TID) | ORAL | 0 refills | Status: DC

## 2020-12-09 MED ORDER — HYDROMORPHONE HCL 4 MG PO TABS: 4.0000 mg | ORAL_TABLET | ORAL | 0 refills | Status: DC | PRN

## 2020-12-12 ENCOUNTER — Other Ambulatory Visit (HOSPITAL_COMMUNITY): Payer: Self-pay | Admitting: Hematology

## 2020-12-14 ENCOUNTER — Encounter: Payer: Self-pay | Admitting: Hematology and Oncology

## 2020-12-14 ENCOUNTER — Other Ambulatory Visit (HOSPITAL_COMMUNITY): Payer: Medicare Other

## 2020-12-14 ENCOUNTER — Ambulatory Visit (HOSPITAL_COMMUNITY): Payer: Medicare Other

## 2020-12-14 ENCOUNTER — Encounter: Payer: Self-pay | Admitting: Hematology

## 2020-12-18 ENCOUNTER — Other Ambulatory Visit (HOSPITAL_COMMUNITY): Payer: Self-pay | Admitting: Hematology

## 2020-12-18 ENCOUNTER — Encounter: Payer: Self-pay | Admitting: Hematology and Oncology

## 2020-12-27 NOTE — Progress Notes (Signed)
[address] [place], [place] [address]   CLINIC:  Medical Oncology/Hematology  PCP:  Derek Jack, [place] / [place] [address] [phone]   REASON FOR VISIT:  Follow-up for ovarian cancer and peritoneal carcinomatosis  PRIOR THERAPY:  1. Exploratory laparotomy, posterior exenteration, argon beam ablation of peritoneum, omentectomy and optimal tumor debulking on 05/05/2016. 2. Carboplatin and paclitaxel x 5 cycles from 06/24/2016. 3. Avastin x 22 cycles through 12/02/2019 discontinued due to TIA and hypertension. 4. Left femur radiation 30 Gy in 10 fractions from 05/07/2020 to 05/20/2020.  NGS Results: not done  CURRENT THERAPY: Carboplatin, gemcitabine and Aloxi every 3 weeks  BRIEF ONCOLOGIC HISTORY:  Oncology History Overview Note  Negative genetics at [facility]   Ovarian cancer ([place])  04/25/2016 Imaging   CT confirms 12cm ovarian masses with extensive peritoneal metastases and malignant pleural effusions   05/05/2016 Surgery   s/p ex-lap posterior exenteration, diaphragm stripping, cholecystectomy, omentectomy, appendectomy and optimal tumor debulking on 05/05/16   06/24/2016 - 10/24/2016 Chemotherapy   Started Taxol/Carboplatin. 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  Numerous left predominant calcified pleural metastases and associated small left pleural effusion are unchanged.  2.  Unchanged (3.0 cm) (series 3, image 96) left breast mass    02/07/2019 Imaging   Outside Ct abdomen and pelvis 1. Overall stable  small volume peritoneal tumor, as described above.  2. Unchanged infrarenal abdominal aortic aneurysm.    02/22/2019 Tumor Marker   Patient's tumor was tested for the following markers: CA-125 Results of the tumor marker test revealed 164   03/18/2019 Tumor Marker   Patient's tumor was tested for the following markers: CA-125 Results of the tumor marker test revealed 162   04/08/2019 Tumor Marker   Patient's tumor was tested for the following markers: CA-125 Results of the tumor marker test revealed 164   04/29/2019 Tumor Marker   Patient's tumor was tested for the following markers: CA-125 Results of the tumor marker test revealed 181   05/20/2019 Tumor Marker   Patient's tumor was tested for the following markers: CA-125 Results of the tumor marker test revealed 164   06/10/2019 Tumor Marker   Patient's tumor was tested for the following markers: CA-125 Results of the tumor marker test revealed 152   07/01/2019 Tumor Marker   Patient's tumor was tested for the following markers: CA-125 Results of the tumor marker test revealed 160   07/22/2019 Tumor Marker   Patient's tumor was tested for the following markers: CA-125 Results of the tumor marker test revealed 171   08/12/2019 Tumor Marker   Patient's tumor was tested for the following markers: CA-125 Results of the tumor marker test revealed 173   09/02/2019 Tumor Marker   Patient's tumor was tested for the following markers: CA-125 Results of the tumor marker test revealed 159   09/30/2019 Tumor Marker   Patient's tumor was tested for the following markers: CA-125 Results of the tumor marker test revealed 168   11/08/2019 Imaging   Outside CT imaging 1. Study  was performed at outside institution on 11/08/2019 and presented for review on 11/14/2019.  2. Stable calcified and noncalcified left-sided nodular and plaque-like pleural abnormalities with small chronic left pleural effusion compatible with stable treated pleural  metastasis. No new or enlarging lesions.  3. No enlarging thoracic lymph nodes.  4. Redemonstration of left breast mass. Correlation with mammography is suggested if not already performed   12/02/2019 Tumor Marker   Patient's tumor was tested for the following markers: CA-125 Results of the tumor marker test revealed 186   12/18/2019 Cancer Staging   Staging form: Ovary, Fallopian Tube, and Primary Peritoneal Carcinoma, AJCC 8th Edition - Clinical stage from 12/18/2019: FIGO Stage IV (rcT3c, cN0, cM1) - Signed by [name], MD on 04/24/2020    01/09/2020 Tumor Marker   Patient's tumor was tested for the following markers: CA-125 Results of the tumor marker test revealed 184   01/10/2020 Imaging   1. Treated left pleural and peritoneal metastatic disease. Areas of residual soft tissue prominence in the right anatomic pelvis are indeterminate. Comparison with prior exams would be helpful. 2. Hepatomegaly. Low-attenuation lesions in the liver are too small to characterize. Comparison with prior exams would be helpful. 3. Small left fibrothorax. 4. Infrarenal Aortic aneurysm NOS (ICD10-I71.9). Recommend followup by ultrasound in 2 years.  5. Aortic atherosclerosis (ICD10-I70.0). Coronary artery calcification.   03/09/2020 Tumor Marker   Patient's tumor was tested for the following markers: CA-125 Results of the tumor marker test revealed 214   04/01/2020 Tumor Marker   Patient's tumor was tested for the following markers: CA-125 Results of the tumor marker test revealed 245.   04/03/2020 Imaging   1. No acute findings in the abdomen or pelvis. Specifically, no findings to explain the patient's history of left upper quadrant pain with nausea and vomiting. 2. Stable appearance of calcified omental and mesenteric nodules consistent with treated metastatic disease. Soft tissue fullness seen in the right adnexal region on the prior exam is similar today. 3. Stable 1.6 x 1.2 cm low-density lesion  lateral segment left liver. 4. 3.5 cm infrarenal abdominal aortic aneurysm. Recommend follow-up ultrasound every 2 years. This recommendation follows ACR consensus guidelines: White Paper of the ACR Incidental Findings Committee II on Vascular Findings. J Am Coll Radiol 2013; 16:109-604. 5. Aortic Atherosclerosis (ICD10-I70.0).   04/15/2020 Imaging   Focus of abnormal uptake is seen involving the greater trochanter of the proximal left femur which corresponds to sclerotic density seen on prior CT scan and is concerning for metastatic disease.   04/23/2020 Imaging   1. No hip fracture, dislocation or avascular necrosis. 2. A 18 mm bone lesion in the peripheral superior aspect of the left greater trochanter with enhancement on postcontrast imaging. The overall appearance is most concerning for metastatic disease in the setting of known malignancy. 3. 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 4. Degenerative disease with disc height loss at L4-5 and L5-S1 with bilateral facet arthropathy.   06/08/2020 Tumor Marker   Patient's tumor was tested for the following markers: CA-125. Results of the tumor marker test revealed 272.   06/24/2020 Imaging   1. Overall progression of calcified and noncalcified peritoneal surface and omental lesions. 2. Slight progression of disease at the left lung base and involving the pleura. 3. Stable sclerotic bone lesion involving the left greater trochanter. 4. Stable infrarenal abdominal aortic aneurysm.   07/03/2020 -  Chemotherapy    Patient is on Treatment Plan: OVARIAN RECURRENT 3RD LINE CARBOPLATIN D1 / GEMCITABINE D1,8 (  4/800) Q21D       07/03/2020 Tumor Marker   Patient's tumor was tested for the following markers: CA-125 Results of the tumor marker test revealed 285.   07/10/2020 Tumor Marker   Patient's tumor was tested for the following markers: CA-125 Results of the tumor marker test revealed 332   07/23/2020 Tumor Marker   Patient's tumor was tested  for the following markers: CA-125 Results of the tumor marker test revealed 308     CANCER STAGING: Cancer Staging Ovarian cancer [facility]) Staging form: Ovary, Fallopian Tube, and Primary Peritoneal Carcinoma, AJCC 8th Edition - Clinical stage from 12/18/2019: FIGO Stage IV (rcT3c, cN0, cM1) - Signed by [name], MD on 04/24/2020  Virtual Visit via Video Note  I connected with Heidi Johnson on @TODAY @ at 10:15 AM EDT by a video enabled telemedicine application and verified that I am speaking with the correct person using two identifiers.  Location: Patient:  Heidi Johnson Provider: Dr. Derek Jack   I discussed the limitations of evaluation and management by telemedicine and the availability of in person appointments. The patient expressed understanding and agreed to proceed.  INTERVAL HISTORY:  Heidi Johnson, a 50 y.o. female, returns for routine follow-up of her ovarian cancer and peritoneal carcinomatosis. Heidi Johnson was last seen on 12/03/20.   Today she reports feeling well. She reports numbness and tingling in her legs and feet that limits her ROM and prevents her from walking which is consistent with the neuropathy she has been experiencing following treatment; she reports gabapentin has helped with this. She reports nausea and vomiting, but denies any bleeding issues. She reports increasingly reduced appetite, and she has been drinking 2 Ensures with one meal daily.  She has been taking Marinol 2-3 times daily which has helped slightly with her appetite. She is taking lisinopril once daily, and she is no longer taking Lasix. She denies light-headedness and reports slight hair loss. She is taking magnesium TID and 2 potassium tablets daily.   Overall she feels ready for Cycle 7 of CARBOplatin today.   REVIEW OF SYSTEMS:  Review of Systems  Constitutional:  Positive for appetite change (reduced).  Gastrointestinal:  Positive for constipation, nausea and  vomiting.  Musculoskeletal:  Positive for back pain (6/10) and myalgias (leg 6/10).  Neurological:  Positive for headaches and numbness (tingling in hands and feet).  Psychiatric/Behavioral:  Positive for sleep disturbance.   All other systems reviewed and are negative.  PAST MEDICAL/SURGICAL HISTORY:  Past Medical History:  Diagnosis Date   Cancer ([place])    ovarian cancer   CHF (congestive heart failure) (HCC)    Hypertension    Panic attacks    Stroke [facility])    Past Surgical History:  Procedure Laterality Date   ABDOMINAL HYSTERECTOMY     BACK SURGERY     TUBAL LIGATION      SOCIAL HISTORY:  Social History   Socioeconomic History   Marital status: Divorced    Spouse name: Not on file   Number of children: 3   Years of education: Not on file   Highest education level: Not on file  Occupational History   Occupation: retired  Tobacco Use   Smoking status: Former    Pack years: 0.00    Types: Cigarettes    Quit date: 06/20/2018    Years since quitting: 2.5   Smokeless tobacco: Never  Vaping Use   Vaping Use: Never used  Substance and Sexual Activity  Alcohol use: No   Drug use: Yes    Types: Marijuana   Sexual activity: Yes  Other Topics Concern   Not on file  Social History Narrative   Lives alone   Social Determinants of Health   Financial Resource Strain: High Risk   Difficulty of Paying Living Expenses: Hard  Food Insecurity: No Food Insecurity   Worried About Running Out of Food in the Last Year: Never true   Ran Out of Food in the Last Year: Never true  Transportation Needs: No Transportation Needs   Lack of Transportation (Medical): No   Lack of Transportation (Non-Medical): No  Physical Activity: Inactive   Days of Exercise per Week: 0 days   Minutes of Exercise per Session: 0 min  Stress: No Stress Concern Present   Feeling of Stress : Only a little  Social Connections: Moderately Isolated   Frequency of Communication with Friends and Family:  Twice a week   Frequency of Social Gatherings with Friends and Family: Twice a week   Attends Religious Services: 1 to 4 times per year   Active Member of [employer] or Organizations: No   Attends Music therapist: Never   Marital Status: Divorced  Human resources officer Violence: Not At Risk   Fear of Current or Ex-Partner: No   Emotionally Abused: No   Physically Abused: No   Sexually Abused: No    FAMILY HISTORY:  Family History  Problem Relation Age of Onset   Cancer Maternal Aunt        breast ca/GYN    CURRENT MEDICATIONS:  Current Outpatient Medications  Medication Sig Dispense Refill   albuterol (VENTOLIN HFA) 108 (90 Base) MCG/ACT inhaler Inhale 2 puffs into the lungs as needed. 2 puffs into lungs every 6 hours as needed for wheezing or SOB     apixaban (ELIQUIS) 5 MG TABS tablet Take 1 tablet (5 mg total) by mouth 2 (two) times daily. 60 tablet 5   clonazePAM (KLONOPIN) 1 MG tablet Take 1 mg by mouth 3 (three) times daily as needed for anxiety.     cyclobenzaprine (FLEXERIL) 5 MG tablet Take 1 tablet by mouth 3 (three) times daily as needed.     docusate sodium (COLACE) 100 MG capsule Take 100 mg by mouth 2 (two) times daily.     dronabinol (MARINOL) 5 MG capsule Take 1 capsule (5 mg total) by mouth 2 (two) times daily before lunch and supper. 60 capsule 1   ergocalciferol (VITAMIN D2) 1.25 MG (50000 UT) capsule Take 1 capsule (50,000 Units total) by mouth once a week. 12 capsule 1   furosemide (LASIX) 20 MG tablet TAKE 2 TABLETS (40 MG TOTAL) BY MOUTH DAILY AS NEEDED FOR FLUID OR EDEMA. 60 tablet 1   gabapentin (NEURONTIN) 300 MG capsule Take 3 capsules (900 mg total) by mouth 3 (three) times daily. 270 capsule 4   HYDROmorphone (DILAUDID) 4 MG tablet Take 1 tablet (4 mg total) by mouth every 4 (four) hours as needed for severe pain. 180 tablet 0   ibuprofen (ADVIL) 600 MG tablet Take 600 mg by mouth every 6 (six) hours as needed.     lisinopril-hydrochlorothiazide  (ZESTORETIC) 20-12.5 MG tablet Take 1 tablet by mouth 2 (two) times daily.     loperamide (IMODIUM) 2 MG capsule Take 1 capsule (2 mg total) by mouth as needed for diarrhea or loose stools. 60 capsule 3   magnesium oxide (MAG-OX) 400 (240 Mg) MG tablet Take 1 tablet (400  mg total) by mouth 3 (three) times daily. 90 tablet 3   MARINOL 5 MG capsule Take 1 capsule (5 mg total) by mouth 2 (two) times daily before lunch and supper. 60 capsule 3   morphine (MS CONTIN) 30 MG 12 hr tablet Take 1 tablet (30 mg total) by mouth every 8 (eight) hours. 90 tablet 0   naloxone (NARCAN) 2 MG/2ML injection Place 2 mg into the nose as needed.      OLANZapine (ZYPREXA) 10 MG tablet Take 10 mg by mouth at bedtime.     ondansetron (ZOFRAN) 8 MG tablet Take 1 tablet (8 mg total) by mouth every 8 (eight) hours as needed for nausea or vomiting. 60 tablet 3   ondansetron (ZOFRAN-ODT) 8 MG disintegrating tablet Take 1 tablet (8 mg total) by mouth every 8 (eight) hours as needed for nausea or vomiting. (Patient not taking: Reported on 12/03/2020) 60 tablet 1   polyethylene glycol (MIRALAX / GLYCOLAX) 17 g packet Take 17 g by mouth 2 (two) times daily. 100 each 11   potassium chloride (KLOR-CON M10) 10 MEQ tablet TAKE 2 TABLETS BY MOUTH DAILY 180 tablet 1   prochlorperazine (COMPAZINE) 10 MG tablet Take 1 tablet (10 mg total) by mouth every 6 (six) hours as needed (Nausea or vomiting). (Patient not taking: Reported on 12/03/2020) 30 tablet 1   promethazine (PHENERGAN) 12.5 MG tablet Take 1 tablet (12.5 mg total) by mouth every 6 (six) hours as needed for nausea or vomiting. (Patient not taking: Reported on 12/03/2020) 60 tablet 3   rosuvastatin (CRESTOR) 40 MG tablet Take 40 mg by mouth daily.     senna (SENOKOT) 8.6 MG tablet Take 2 tablets (17.2 mg total) by mouth 3 (three) times daily. 90 tablet 1   No current facility-administered medications for this visit.   Facility-Administered Medications Ordered in Other Visits   Medication Dose Route Frequency Provider Last Rate Last Admin   heparin lock flush 100 unit/mL  500 Units Intracatheter Once Alvy Bimler, Ni, MD        ALLERGIES:  Allergies  Allergen Reactions   Latex Rash and Hives   Sulfamethoxazole-Trimethoprim Other (See Comments)    Reaction:  Unknown  Other reaction(s): Unknown When 38, dr told her not to take    Performance status (ECOG): 1 - Symptomatic but completely ambulatory  There were no vitals filed for this visit. Wt Readings from Last 3 Encounters:  12/03/20 189 lb 3.2 oz (85.8 kg)  11/18/20 186 lb 8.2 oz (84.6 kg)  11/02/20 187 lb (84.8 kg)   LABORATORY DATA:  I have reviewed the labs as listed.  CBC Latest Ref Rng & Units 12/03/2020 11/18/2020 11/02/2020  WBC 4.0 - 10.5 K/uL 5.5 6.1 4.1  Hemoglobin 12.0 - 15.0 g/dL 12.4 12.6 12.5  Hematocrit 36.0 - 46.0 % 38.1 39.6 38.4  Platelets 150 - 400 K/uL 183 240 137(L)   CMP Latest Ref Rng & Units 12/03/2020 11/18/2020 11/02/2020  Glucose 70 - 99 mg/dL 187(H) 127(H) 118(H)  BUN 6 - 20 mg/dL 15 13 12   Creatinine 0.44 - 1.00 mg/dL 0.73 0.66 0.82  Sodium 135 - 145 mmol/L 133(L) 136 137  Potassium 3.5 - 5.1 mmol/L 3.3(L) 3.5 3.9  Chloride 98 - 111 mmol/L 98 99 104  CO2 22 - 32 mmol/L 27 27 26   Calcium 8.9 - 10.3 mg/dL 8.8(L) 9.4 9.0  Total Protein 6.5 - 8.1 g/dL 6.9 7.0 6.5  Total Bilirubin 0.3 - 1.2 mg/dL 0.3 0.4 0.5  Alkaline  Phos 38 - 126 U/L 83 74 75  AST 15 - 41 U/L 22 19 24   ALT 0 - 44 U/L 16 16 20     DIAGNOSTIC IMAGING:  I have independently reviewed the scans and discussed with the patient. No results found.   ASSESSMENT:  -06/24/2016: Started Taxol/carboplatin.  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  Taxol discontinued cycle 5. -10/24/2016: Started maintenance letrozole. -March 2020 bevacizumab started and completed 22 cycles until 12/02/2019, discontinued due to TIA and uncontrolled hypertension. -Germline mutation testing reportedly done at [facility] was negative. -She is  under the care of Dr. Alvy Bimler from July 2021 -CTAP on 04/03/2020 with stable appearance of calcified omental and mesenteric nodules.  Soft tissue fullness in the right adnexal region is stable. -MRI of the left hip on 04/23/2020 with 18 mm bone lesion in the peripheral superior aspect of the left greater trochanter -XRT to left femur lesion, 30 Gray from 05/06/2020 through 05/20/2020. -CTAP on 06/24/2020 with overall progression of calcified and noncalcified peritoneal surface and omental lesions with slight progression of disease at the left lung base involving the pleura.  Stable sclerotic bone lesion involving the left greater trochanter. -3 cycles of carboplatin and gemcitabine from 07/03/2020 through 08/17/2020. -CT CAP on 09/02/2020 showed right hilar lymph node slightly increased in size.  Calcified left lower paratracheal lymph node also slightly increased in size.  CT of the chest was compared to prior CT scan from 04/03/2020.  CTAP showed hypodense lesion in the dome of the right hepatic lobe is mildly hyperdense measuring 1.1 x 0.8 cm, previously the same.  Hypodense lesion in the lateral segment of the left hepatic lobe measures 2 x 1.7 cm, formerly 1.8 x 1.3 cm. -CA-125 has steadily improved.   2.  Social/family history: -Lives by herself.  Worked as a Land.  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  Quit smoking 2 years ago.   3.  Abdominal aneurysm: -CTAP on 06/24/2020 shows stable 3.4 x 3.3 cm infrarenal abdominal aortic aneurysm. -Used to follow with vascular surgery at [facility].   4.  Saddle pulmonary embolism: -CT angiogram of the chest on 10/27/2020 at [facility] showed saddle pulmonary embolus with extension into the right upper lobe, lower lobe and middle lobe pulmonary arteries as well as areas of segmental and subsegmental extension.  Left pulmonary artery competent appears to involve the left main pulmonary artery with areas of segmental and  subsegmental extension into the left lower lobe.  Small left pleural effusion.  Findings concerning for right heart strain.  Small left pleural effusion. - Left leg Doppler on 10/27/2020 was negative for thrombosis. - Lovenox followed by Eliquis which was started on 11/02/2020.   PLAN:  1.  Stage IVa low-grade ovarian cancer: - We had to hold her chemotherapy for 6 to 8 weeks due to pulmonary embolism. - Her last CA125 has increased to 310 from 279. - We have started her back on single agent carboplatin on 12/03/2020. - She reported decreased appetite.  She is drinking about 2 ensures per day.  She is eating 1 meal per day.  She has gained 1 pound since last visit.  She does not snack in between. - Continue Marinol at least twice daily. - She will proceed with her next treatment of carboplatin today.  I will add Ativan to the premedication as she had nausea last time. - RTC 3 weeks for follow-up.  I plan to repeat CT CAP with  contrast and tumor marker level.   2.  Back pain/neuropathy: - Continue MS Contin 30 mg every 8 hours. - Continue Dilaudid 4 mg every 4 hours as needed. - Continue gabapentin 900 mg 3 times daily.   3.  Hypertension: - Blood pressure today is 91/68.  She reports blood pressure is normal at home.  She already took her blood pressure medication this morning. - She is taking lisinopril/HCTZ 20/12.5 once daily. - She was told to hold the medication if the systolic blood pressure is less than 100 at home.   4.  Nausea/vomiting: - Continue Zofran and Phenergan as needed.   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  She does not report any bleeding issues. - Continue oxygen 3 L/min on exertion.   6.  Hypokalemia: - Potassium today 3.6. - Continue potassium 10 mEq 2 tablets daily.  7.  Hypomagnesemia: - She started taking magnesium 3 times daily after last visit. - Today magnesium is 1.7.  Continue magnesium 3 times daily.    Orders placed this  encounter:  No orders of the defined types were placed in this encounter.  I have provided 30 minutes of non-face-to-face time during this encounter.  Derek Jack, MD [place] [phone]   I, Thana Ates, am acting as a scribe for Dr. Derek Jack.  I, Derek Jack MD, have reviewed the above documentation for accuracy and completeness, and I agree with the above.

## 2020-12-28 ENCOUNTER — Inpatient Hospital Stay (HOSPITAL_BASED_OUTPATIENT_CLINIC_OR_DEPARTMENT_OTHER): Payer: Medicare Other | Admitting: Hematology

## 2020-12-28 ENCOUNTER — Inpatient Hospital Stay (HOSPITAL_COMMUNITY): Payer: Medicare Other | Attending: Hematology

## 2020-12-28 ENCOUNTER — Encounter (HOSPITAL_COMMUNITY): Payer: Self-pay | Admitting: Hematology

## 2020-12-28 ENCOUNTER — Inpatient Hospital Stay (HOSPITAL_COMMUNITY): Payer: Medicare Other

## 2020-12-28 ENCOUNTER — Other Ambulatory Visit: Payer: Self-pay

## 2020-12-28 VITALS — BP 96/62 | HR 87 | Temp 97.5°F | Resp 18

## 2020-12-28 VITALS — BP 91/68 | HR 85 | Temp 97.0°F | Resp 18 | Wt 190.2 lb

## 2020-12-28 DIAGNOSIS — Z803 Family history of malignant neoplasm of breast: Secondary | ICD-10-CM | POA: Insufficient documentation

## 2020-12-28 DIAGNOSIS — C569 Malignant neoplasm of unspecified ovary: Secondary | ICD-10-CM

## 2020-12-28 DIAGNOSIS — K5900 Constipation, unspecified: Secondary | ICD-10-CM | POA: Insufficient documentation

## 2020-12-28 DIAGNOSIS — M791 Myalgia, unspecified site: Secondary | ICD-10-CM | POA: Insufficient documentation

## 2020-12-28 DIAGNOSIS — G479 Sleep disorder, unspecified: Secondary | ICD-10-CM | POA: Insufficient documentation

## 2020-12-28 DIAGNOSIS — Z9049 Acquired absence of other specified parts of digestive tract: Secondary | ICD-10-CM | POA: Insufficient documentation

## 2020-12-28 DIAGNOSIS — F1290 Cannabis use, unspecified, uncomplicated: Secondary | ICD-10-CM | POA: Diagnosis not present

## 2020-12-28 DIAGNOSIS — M549 Dorsalgia, unspecified: Secondary | ICD-10-CM | POA: Insufficient documentation

## 2020-12-28 DIAGNOSIS — I2510 Atherosclerotic heart disease of native coronary artery without angina pectoris: Secondary | ICD-10-CM | POA: Diagnosis not present

## 2020-12-28 DIAGNOSIS — C786 Secondary malignant neoplasm of retroperitoneum and peritoneum: Secondary | ICD-10-CM | POA: Diagnosis not present

## 2020-12-28 DIAGNOSIS — Z79899 Other long term (current) drug therapy: Secondary | ICD-10-CM | POA: Diagnosis not present

## 2020-12-28 DIAGNOSIS — R200 Anesthesia of skin: Secondary | ICD-10-CM | POA: Insufficient documentation

## 2020-12-28 DIAGNOSIS — Z7901 Long term (current) use of anticoagulants: Secondary | ICD-10-CM | POA: Diagnosis not present

## 2020-12-28 DIAGNOSIS — R112 Nausea with vomiting, unspecified: Secondary | ICD-10-CM | POA: Insufficient documentation

## 2020-12-28 DIAGNOSIS — I509 Heart failure, unspecified: Secondary | ICD-10-CM | POA: Insufficient documentation

## 2020-12-28 DIAGNOSIS — G620 Drug-induced polyneuropathy: Secondary | ICD-10-CM

## 2020-12-28 DIAGNOSIS — C782 Secondary malignant neoplasm of pleura: Secondary | ICD-10-CM | POA: Insufficient documentation

## 2020-12-28 DIAGNOSIS — Z881 Allergy status to other antibiotic agents status: Secondary | ICD-10-CM | POA: Insufficient documentation

## 2020-12-28 DIAGNOSIS — E876 Hypokalemia: Secondary | ICD-10-CM | POA: Insufficient documentation

## 2020-12-28 DIAGNOSIS — M47816 Spondylosis without myelopathy or radiculopathy, lumbar region: Secondary | ICD-10-CM | POA: Diagnosis not present

## 2020-12-28 DIAGNOSIS — J984 Other disorders of lung: Secondary | ICD-10-CM | POA: Insufficient documentation

## 2020-12-28 DIAGNOSIS — Z7189 Other specified counseling: Secondary | ICD-10-CM

## 2020-12-28 DIAGNOSIS — Z8673 Personal history of transient ischemic attack (TIA), and cerebral infarction without residual deficits: Secondary | ICD-10-CM | POA: Insufficient documentation

## 2020-12-28 DIAGNOSIS — I700 Atherosclerosis of aorta: Secondary | ICD-10-CM | POA: Insufficient documentation

## 2020-12-28 DIAGNOSIS — Z86711 Personal history of pulmonary embolism: Secondary | ICD-10-CM | POA: Diagnosis not present

## 2020-12-28 DIAGNOSIS — Z596 Low income: Secondary | ICD-10-CM | POA: Insufficient documentation

## 2020-12-28 DIAGNOSIS — R519 Headache, unspecified: Secondary | ICD-10-CM | POA: Diagnosis not present

## 2020-12-28 DIAGNOSIS — Z5111 Encounter for antineoplastic chemotherapy: Secondary | ICD-10-CM | POA: Diagnosis present

## 2020-12-28 DIAGNOSIS — T451X5A Adverse effect of antineoplastic and immunosuppressive drugs, initial encounter: Secondary | ICD-10-CM

## 2020-12-28 LAB — COMPREHENSIVE METABOLIC PANEL
ALT: 16 U/L (ref 0–44)
AST: 20 U/L (ref 15–41)
Albumin: 3.4 g/dL — ABNORMAL LOW (ref 3.5–5.0)
Alkaline Phosphatase: 94 U/L (ref 38–126)
Anion gap: 9 (ref 5–15)
BUN: 14 mg/dL (ref 6–20)
CO2: 27 mmol/L (ref 22–32)
Calcium: 8.7 mg/dL — ABNORMAL LOW (ref 8.9–10.3)
Chloride: 101 mmol/L (ref 98–111)
Creatinine, Ser: 0.76 mg/dL (ref 0.44–1.00)
GFR, Estimated: >60 mL/min (ref >60)
Glucose, Bld: 141 mg/dL — ABNORMAL HIGH (ref 70–99)
Potassium: 3.6 mmol/L (ref 3.5–5.1)
Sodium: 137 mmol/L (ref 135–145)
Total Bilirubin: 0.4 mg/dL (ref 0.3–1.2)
Total Protein: 6.9 g/dL (ref 6.5–8.1)

## 2020-12-28 LAB — CBC WITH DIFFERENTIAL/PLATELET
Abs Immature Granulocytes: 0.02 10*3/uL (ref 0.00–0.07)
Basophils Absolute: 0.1 10*3/uL (ref 0.0–0.1)
Basophils Relative: 1 %
Eosinophils Absolute: 0.1 10*3/uL (ref 0.0–0.5)
Eosinophils Relative: 2 %
HCT: 37.3 % (ref 36.0–46.0)
Hemoglobin: 12 g/dL (ref 12.0–15.0)
Immature Granulocytes: 0 %
Lymphocytes Relative: 25 %
Lymphs Abs: 1.4 10*3/uL (ref 0.7–4.0)
MCH: 29.7 pg (ref 26.0–34.0)
MCHC: 32.2 g/dL (ref 30.0–36.0)
MCV: 92.3 fL (ref 80.0–100.0)
Monocytes Absolute: 0.5 10*3/uL (ref 0.1–1.0)
Monocytes Relative: 10 %
Neutro Abs: 3.5 10*3/uL (ref 1.7–7.7)
Neutrophils Relative %: 62 %
Platelets: 218 10*3/uL (ref 150–400)
RBC: 4.04 MIL/uL (ref 3.87–5.11)
RDW: 16.3 % — ABNORMAL HIGH (ref 11.5–15.5)
WBC: 5.6 10*3/uL (ref 4.0–10.5)
nRBC: 0 % (ref 0.0–0.2)

## 2020-12-28 LAB — MAGNESIUM: Magnesium: 1.7 mg/dL (ref 1.7–2.4)

## 2020-12-28 MED ORDER — FAMOTIDINE 20 MG IN NS 100 ML IVPB
20.0000 mg | Freq: Once | INTRAVENOUS | Status: AC
  Administered 2020-12-28: 20 mg via INTRAVENOUS
  Filled 2020-12-28: qty 20

## 2020-12-28 MED ORDER — FOSAPREPITANT DIMEGLUMINE INJECTION 150 MG
150.0000 mg | Freq: Once | INTRAVENOUS | Status: AC
  Administered 2020-12-28: 150 mg via INTRAVENOUS
  Filled 2020-12-28: qty 150

## 2020-12-28 MED ORDER — LORAZEPAM 2 MG/ML IJ SOLN
0.5000 mg | Freq: Once | INTRAMUSCULAR | Status: AC
Start: 2020-12-28 — End: 2020-12-28
  Administered 2020-12-28: 0.5 mg via INTRAVENOUS
  Filled 2020-12-28: qty 1

## 2020-12-28 MED ORDER — SODIUM CHLORIDE 0.9 % IV SOLN
564.8000 mg | Freq: Once | INTRAVENOUS | Status: AC
  Administered 2020-12-28: 560 mg via INTRAVENOUS
  Filled 2020-12-28: qty 56

## 2020-12-28 MED ORDER — PALONOSETRON HCL INJECTION 0.25 MG/5ML
INTRAVENOUS | Status: AC
  Filled 2020-12-28: qty 5

## 2020-12-28 MED ORDER — DIPHENHYDRAMINE HCL 50 MG/ML IJ SOLN
50.0000 mg | Freq: Once | INTRAMUSCULAR | Status: AC
  Administered 2020-12-28: 50 mg via INTRAVENOUS
  Filled 2020-12-28: qty 1

## 2020-12-28 MED ORDER — SODIUM CHLORIDE 0.9 % IV SOLN: Freq: Once | INTRAVENOUS | Status: AC

## 2020-12-28 MED ORDER — SODIUM CHLORIDE 0.9 % IV SOLN
10.0000 mg | Freq: Once | INTRAVENOUS | Status: AC
  Administered 2020-12-28: 10 mg via INTRAVENOUS
  Filled 2020-12-28: qty 10

## 2020-12-28 MED ORDER — HEPARIN SOD (PORK) LOCK FLUSH 100 UNIT/ML IV SOLN
500.0000 [IU] | Freq: Once | INTRAVENOUS | Status: AC | PRN
  Administered 2020-12-28: 500 [IU]

## 2020-12-28 MED ORDER — PALONOSETRON HCL INJECTION 0.25 MG/5ML
0.2500 mg | Freq: Once | INTRAVENOUS | Status: AC
  Administered 2020-12-28: 0.25 mg via INTRAVENOUS

## 2020-12-28 MED ORDER — SODIUM CHLORIDE 0.9% FLUSH
10.0000 mL | INTRAVENOUS | Status: DC | PRN
  Administered 2020-12-28: 10 mL

## 2020-12-28 MED ORDER — PROCHLORPERAZINE EDISYLATE 10 MG/2ML IJ SOLN
INTRAMUSCULAR | Status: AC
  Filled 2020-12-28: qty 2

## 2020-12-28 MED ORDER — PROCHLORPERAZINE EDISYLATE 10 MG/2ML IJ SOLN
10.0000 mg | Freq: Once | INTRAMUSCULAR | Status: AC
  Administered 2020-12-28: 10 mg via INTRAVENOUS
  Filled 2020-12-28: qty 2

## 2020-12-28 NOTE — Progress Notes (Signed)
Patient has been assessed, vital signs and labs have been reviewed by Dr. Katragadda. ANC, Creatinine, LFTs, and Platelets are within treatment parameters per Dr. Katragadda. The patient is good to proceed with treatment at this time. Primary RN and pharmacy aware.  

## 2020-12-28 NOTE — Progress Notes (Signed)
Labs reviewed today by Dr. Delton Coombes, will proceed as planned per MD.   1413-patient calls out for nurse, she is nauseated , spitting up thick clear sputum-phlegm. See flow sheet for vitals, patient diaphoretic, afebrile. PA and MD notified, will continue to monitor vitals and give IV compazine per orders.   Treatment given per orders. Vitals stable and discharged home from clinic via wheelchair. Follow up as scheduled.

## 2020-12-28 NOTE — Patient Instructions (Addendum)
[place] at [facility] Discharge Instructions  You were seen today by Dr. Delton Coombes. He went over your recent results, and you received your treatment. You will be scheduled for a CT scan of your chest, abdomen, and pelvis prior to your next appointment. Dr. Delton Coombes will see you back in 3 weeks for labs and follow up.   Thank you for choosing [place] at [facility] to provide your oncology and hematology care.  To afford each patient quality time with our provider, please arrive at least 15 minutes before your scheduled appointment time.   If you have a lab appointment with the [facility] please come in thru the Main Entrance and check in at the main information desk  You need to re-schedule your appointment should you arrive 10 or more minutes late.  We strive to give you quality time with our providers, and arriving late affects you and other patients whose appointments are after yours.  Also, if you no show three or more times for appointments you may be dismissed from the clinic at the providers discretion.     Again, thank you for choosing [facility].  Our hope is that these requests will decrease the amount of time that you wait before being seen by our physicians.       _____________________________________________________________  Should you have questions after your visit to [facility], please contact our office at [phone] between the hours of 8:00 a.m. and 4:30 p.m.  Voicemails left after 4:00 p.m. will not be returned until the following business day.  For prescription refill requests, have your pharmacy contact our office and allow 72 hours.    Cancer Center Support Programs:   > Cancer Support Group  2nd Tuesday of the month 1pm-2pm, Journey Room

## 2020-12-28 NOTE — Patient Instructions (Signed)
[facility]  Discharge Instructions: Thank you for choosing [facility] to provide your oncology and hematology care.  If you have a lab appointment with the Cancer Center, please come in thru the Main Entrance and check in at the main information desk.  Wear comfortable clothing and clothing appropriate for easy access to any Portacath or PICC line.   We strive to give you quality time with your provider. You may need to reschedule your appointment if you arrive late (15 or more minutes).  Arriving late affects you and other patients whose appointments are after yours.  Also, if you miss three or more appointments without notifying the office, you may be dismissed from the clinic at the provider's discretion.      For prescription refill requests, have your pharmacy contact our office and allow 72 hours for refills to be completed.        To help prevent nausea and vomiting after your treatment, we encourage you to take your nausea medication as directed.  BELOW ARE SYMPTOMS THAT SHOULD BE REPORTED IMMEDIATELY: *FEVER GREATER THAN 100.4 F (38 C) OR HIGHER *CHILLS OR SWEATING *NAUSEA AND VOMITING THAT IS NOT CONTROLLED WITH YOUR NAUSEA MEDICATION *UNUSUAL SHORTNESS OF BREATH *UNUSUAL BRUISING OR BLEEDING *URINARY PROBLEMS (pain or burning when urinating, or frequent urination) *BOWEL PROBLEMS (unusual diarrhea, constipation, pain near the anus) TENDERNESS IN MOUTH AND THROAT WITH OR WITHOUT PRESENCE OF ULCERS (sore throat, sores in mouth, or a toothache) UNUSUAL RASH, SWELLING OR PAIN  UNUSUAL VAGINAL DISCHARGE OR ITCHING   Items with * indicate a potential emergency and should be followed up as soon as possible or go to the Emergency Department if any problems should occur.  Please show the CHEMOTHERAPY ALERT CARD or IMMUNOTHERAPY ALERT CARD at check-in to the Emergency Department and triage nurse.  Should you have questions after your visit or need to cancel  or reschedule your appointment, please contact [facility] [phone]  and follow the prompts.  Office hours are 8:00 a.m. to 4:30 p.m. Monday - Friday. Please note that voicemails left after 4:00 p.m. may not be returned until the following business day.  We are closed weekends and major holidays. You have access to a nurse at all times for urgent questions. Please call the main number to the clinic [phone] and follow the prompts.  For any non-urgent questions, you may also contact your provider using MyChart. We now offer e-Visits for anyone 18 and older to request care online for non-urgent symptoms. For details visit [email or web address].   Also download the MyChart app! Go to the app store, search "MyChart", open the app, select [facility], and log in with your MyChart username and password.  Due to Covid, a mask is required upon entering the hospital/clinic. If you do not have a mask, one will be given to you upon arrival. For doctor visits, patients may have 1 support person aged 18 or older with them. For treatment visits, patients cannot have anyone with them due to current Covid guidelines and our immunocompromised population.  

## 2020-12-28 NOTE — Progress Notes (Signed)
Patients port flushed without difficulty.  Good blood return noted with no bruising or swelling noted at site.  Stable during access and blood draw.  Patient to remain accessed for treatment. 

## 2021-01-04 ENCOUNTER — Ambulatory Visit (HOSPITAL_COMMUNITY): Payer: Medicare Other

## 2021-01-04 ENCOUNTER — Other Ambulatory Visit (HOSPITAL_COMMUNITY): Payer: Medicare Other

## 2021-01-05 ENCOUNTER — Other Ambulatory Visit: Payer: Self-pay | Admitting: Hematology and Oncology

## 2021-01-07 ENCOUNTER — Other Ambulatory Visit (HOSPITAL_COMMUNITY): Payer: Self-pay | Admitting: Hematology and Oncology

## 2021-01-07 DIAGNOSIS — C786 Secondary malignant neoplasm of retroperitoneum and peritoneum: Secondary | ICD-10-CM

## 2021-01-07 DIAGNOSIS — C569 Malignant neoplasm of unspecified ovary: Secondary | ICD-10-CM

## 2021-01-07 MED ORDER — MORPHINE SULFATE ER 30 MG PO TBCR: 30.0000 mg | EXTENDED_RELEASE_TABLET | Freq: Three times a day (TID) | ORAL | 0 refills | Status: DC

## 2021-01-07 MED ORDER — HYDROMORPHONE HCL 4 MG PO TABS: 4.0000 mg | ORAL_TABLET | ORAL | 0 refills | Status: DC | PRN

## 2021-01-08 ENCOUNTER — Other Ambulatory Visit (HOSPITAL_COMMUNITY): Payer: Self-pay | Admitting: Physician Assistant

## 2021-01-08 ENCOUNTER — Other Ambulatory Visit (HOSPITAL_COMMUNITY): Payer: Self-pay

## 2021-01-08 DIAGNOSIS — C569 Malignant neoplasm of unspecified ovary: Secondary | ICD-10-CM

## 2021-01-08 MED ORDER — HYDROMORPHONE HCL 4 MG PO TABS: 4.0000 mg | ORAL_TABLET | ORAL | 0 refills | Status: DC | PRN

## 2021-01-08 NOTE — Progress Notes (Addendum)
Initial prescription for Dilaudid was sent to the [facility] in [place] on [address], but they had called to say that they were out of stock of hydromorphone and would not be able to fill the prescription for a few more days.  As such, [facility] this [facility] was canceled, and RN Chasity Edwards called pharmacy staff to ensure that it had not yet been picked up by the patient to avoid duplicate narcotics prescriptions.  New prescription for [facility]id was [facility] [address] in [place], which confirmed that it had enough of the medication stock to fill the patient's prescription.

## 2021-01-09 ENCOUNTER — Other Ambulatory Visit (HOSPITAL_COMMUNITY): Payer: Self-pay | Admitting: Physician Assistant

## 2021-01-10 ENCOUNTER — Encounter: Payer: Self-pay | Admitting: Hematology

## 2021-01-10 ENCOUNTER — Encounter: Payer: Self-pay | Admitting: Hematology and Oncology

## 2021-01-11 ENCOUNTER — Ambulatory Visit (HOSPITAL_BASED_OUTPATIENT_CLINIC_OR_DEPARTMENT_OTHER): Admission: RE | Admit: 2021-01-11 | Payer: Medicare Other | Source: Ambulatory Visit

## 2021-01-14 ENCOUNTER — Encounter: Payer: Self-pay | Admitting: Hematology and Oncology

## 2021-01-14 ENCOUNTER — Encounter: Payer: Self-pay | Admitting: Hematology

## 2021-01-15 ENCOUNTER — Other Ambulatory Visit: Payer: Self-pay

## 2021-01-15 ENCOUNTER — Ambulatory Visit (HOSPITAL_BASED_OUTPATIENT_CLINIC_OR_DEPARTMENT_OTHER)
Admission: RE | Admit: 2021-01-15 | Discharge: 2021-01-15 | Disposition: A | Discharge status: Home / Self Care | Payer: Medicare Other | Source: Ambulatory Visit | Attending: Hematology | Admitting: Hematology

## 2021-01-15 DIAGNOSIS — C786 Secondary malignant neoplasm of retroperitoneum and peritoneum: Secondary | ICD-10-CM | POA: Insufficient documentation

## 2021-01-15 DIAGNOSIS — C569 Malignant neoplasm of unspecified ovary: Secondary | ICD-10-CM | POA: Diagnosis present

## 2021-01-15 MED ORDER — IOHEXOL 300 MG/ML  SOLN
75.0000 mL | Freq: Once | INTRAMUSCULAR | Status: AC | PRN
  Administered 2021-01-15: 75 mL via INTRAVENOUS

## 2021-01-16 NOTE — Progress Notes (Signed)
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 Exploratory laparotomy, posterior exenteration, argon beam ablation of peritoneum, omentectomy and optimal tumor debulking on 05/05/2016. 2. Carboplatin and paclitaxel x 5 cycles from 06/24/2016. 3. Avastin x 22 cycles through 12/02/2019 discontinued due to TIA and hypertension. 4. Left femur radiation 30 Gy in 10 fractions from 05/07/2020 to 05/20/2020.  NGS Results: not done  CURRENT THERAPY: Carboplatin, gemcitabine and Aloxi every 3 weeks  BRIEF ONCOLOGIC HISTORY:  Oncology History Overview Note  Negative genetics at [facility]   Ovarian cancer ([place])  04/25/2016 Imaging   CT confirms 12cm ovarian masses with extensive peritoneal metastases and malignant pleural effusions   05/05/2016 Surgery   s/p ex-lap posterior exenteration, diaphragm stripping, cholecystectomy, omentectomy, appendectomy and optimal tumor debulking on 05/05/16   06/24/2016 - 10/24/2016 Chemotherapy   Started Taxol/Carboplatin. 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  Numerous left predominant calcified pleural metastases and associated small left pleural effusion are unchanged.  2.  Unchanged (3.0 cm) (series 3, image 96) left breast mass    02/07/2019 Imaging   Outside Ct abdomen and pelvis 1. Overall stable  small volume peritoneal tumor, as described above.  2. Unchanged infrarenal abdominal aortic aneurysm.    02/22/2019 Tumor Marker   Patient's tumor was tested for the following markers: CA-125 Results of the tumor marker test revealed 164   03/18/2019 Tumor Marker   Patient's tumor was tested for the following markers: CA-125 Results of the tumor marker test revealed 162   04/08/2019 Tumor Marker   Patient's tumor was tested for the following markers: CA-125 Results of the tumor marker test revealed 164   04/29/2019 Tumor Marker   Patient's tumor was tested for the following markers: CA-125 Results of the tumor marker test revealed 181   05/20/2019 Tumor Marker   Patient's tumor was tested for the following markers: CA-125 Results of the tumor marker test revealed 164   06/10/2019 Tumor Marker   Patient's tumor was tested for the following markers: CA-125 Results of the tumor marker test revealed 152   07/01/2019 Tumor Marker   Patient's tumor was tested for the following markers: CA-125 Results of the tumor marker test revealed 160   07/22/2019 Tumor Marker   Patient's tumor was tested for the following markers: CA-125 Results of the tumor marker test revealed 171   08/12/2019 Tumor Marker   Patient's tumor was tested for the following markers: CA-125 Results of the tumor marker test revealed 173   09/02/2019 Tumor Marker   Patient's tumor was tested for the following markers: CA-125 Results of the tumor marker test revealed 159   09/30/2019 Tumor Marker   Patient's tumor was tested for the following markers: CA-125 Results of the tumor marker test revealed 168   11/08/2019 Imaging   Outside CT imaging 1. Study  was performed at outside institution on 11/08/2019 and presented for review on 11/14/2019.  2. Stable calcified and noncalcified left-sided nodular and plaque-like pleural abnormalities with small chronic left pleural effusion compatible with stable treated pleural  metastasis. No new or enlarging lesions.  3. No enlarging thoracic lymph nodes.  4. Redemonstration of left breast mass. Correlation with mammography is suggested if not already performed   12/02/2019 Tumor Marker   Patient's tumor was tested for the following markers: CA-125 Results of the tumor marker test revealed 186   12/18/2019 Cancer Staging   Staging form: Ovary, Fallopian Tube, and Primary Peritoneal Carcinoma, AJCC 8th Edition - Clinical stage from 12/18/2019: FIGO Stage IV (rcT3c, cN0, cM1) - Signed by [name], MD on 04/24/2020    01/09/2020 Tumor Marker   Patient's tumor was tested for the following markers: CA-125 Results of the tumor marker test revealed 184   01/10/2020 Imaging   1. Treated left pleural and peritoneal metastatic disease. Areas of residual soft tissue prominence in the right anatomic pelvis are indeterminate. Comparison with prior exams would be helpful. 2. Hepatomegaly. Low-attenuation lesions in the liver are too small to characterize. Comparison with prior exams would be helpful. 3. Small left fibrothorax. 4. Infrarenal Aortic aneurysm NOS (ICD10-I71.9). Recommend followup by ultrasound in 2 years.  5. Aortic atherosclerosis (ICD10-I70.0). Coronary artery calcification.   03/09/2020 Tumor Marker   Patient's tumor was tested for the following markers: CA-125 Results of the tumor marker test revealed 214   04/01/2020 Tumor Marker   Patient's tumor was tested for the following markers: CA-125 Results of the tumor marker test revealed 245.   04/03/2020 Imaging   1. No acute findings in the abdomen or pelvis. Specifically, no findings to explain the patient's history of left upper quadrant pain with nausea and vomiting. 2. Stable appearance of calcified omental and mesenteric nodules consistent with treated metastatic disease. Soft tissue fullness seen in the right adnexal region on the prior exam is similar today. 3. Stable 1.6 x 1.2 cm low-density lesion  lateral segment left liver. 4. 3.5 cm infrarenal abdominal aortic aneurysm. Recommend follow-up ultrasound every 2 years. This recommendation follows ACR consensus guidelines: White Paper of the ACR Incidental Findings Committee II on Vascular Findings. J Am Coll Radiol 2013CJ:3944253. 5. Aortic Atherosclerosis (ICD10-I70.0).   04/15/2020 Imaging   Focus of abnormal uptake is seen involving the greater trochanter of the proximal left femur which corresponds to sclerotic density seen on prior CT scan and is concerning for metastatic disease.   04/23/2020 Imaging   1. No hip fracture, dislocation or avascular necrosis. 2. A 18 mm bone lesion in the peripheral superior aspect of the left greater trochanter with enhancement on postcontrast imaging. The overall appearance is most concerning for metastatic disease in the setting of known malignancy. 3. 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 4. Degenerative disease with disc height loss at L4-5 and L5-S1 with bilateral facet arthropathy.   06/08/2020 Tumor Marker   Patient's tumor was tested for the following markers: CA-125. Results of the tumor marker test revealed 272.   06/24/2020 Imaging   1. Overall progression of calcified and noncalcified peritoneal surface and omental lesions. 2. Slight progression of disease at the left lung base and involving the pleura. 3. Stable sclerotic bone lesion involving the left greater trochanter. 4. Stable infrarenal abdominal aortic aneurysm.   07/03/2020 -  Chemotherapy    Patient is on Treatment Plan: OVARIAN RECURRENT 3RD LINE CARBOPLATIN D1 / GEMCITABINE D1,8 (  4/800) Q21D       07/03/2020 Tumor Marker   Patient's tumor was tested for the following markers: CA-125 Results of the tumor marker test revealed 285.   07/10/2020 Tumor Marker   Patient's tumor was tested for the following markers: CA-125 Results of the tumor marker test revealed 332   07/23/2020 Tumor Marker   Patient's tumor was tested  for the following markers: CA-125 Results of the tumor marker test revealed 308     CANCER STAGING: Cancer Staging Ovarian cancer [facility]) Staging form: Ovary, Fallopian Tube, and Primary Peritoneal Carcinoma, AJCC 8th Edition - Clinical stage from 12/18/2019: FIGO Stage IV (rcT3c, cN0, cM1) - Signed by [name], MD on 04/24/2020   INTERVAL HISTORY:  Ms. TAYLR ANDRADE, a 50 y.o. female, returns for routine follow-up and consideration for next cycle of chemotherapy. L[place]an was last seen on 12/28/20.  Due for cycle #8 of Carboplatin today.   Overall, she tells me she has been feeling pretty well. She reports one fall since her last visit as well as stomach pains. She has a history of acid reflux, and she is currently taking Protonix. She is drinking 2 Boost daily, and she is unable to consume large solid meals. She is taking lisinopril prn. 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 She is not taking Compazine.   Overall, she feels ready for next cycle of chemo today.   REVIEW OF SYSTEMS:  Review of Systems  Constitutional:  Positive for appetite change (10%) and fatigue (20%).  Gastrointestinal:  Positive for abdominal pain (stomach), constipation and diarrhea.  Musculoskeletal:  Positive for back pain (lower 7/10).  Neurological:  Positive for headaches (7/10) and numbness (feet).  Psychiatric/Behavioral:  Positive for sleep disturbance.   All other systems reviewed and are negative.  PAST MEDICAL/SURGICAL HISTORY:  Past Medical History:  Diagnosis Date   Cancer ([place])    ovarian cancer   CHF (congestive heart failure) (HCC)    Hypertension    Panic attacks    Stroke [facility])    Past Surgical History:  Procedure Laterality Date   ABDOMINAL HYSTERECTOMY     BACK SURGERY     TUBAL LIGATION      SOCIAL HISTORY:  Social History   Socioeconomic History   Marital status: Divorced    Spouse name: Not on file    Number of children: 3   Years of education: Not on file   Highest education level: Not on file  Occupational History   Occupation: retired  Tobacco Use   Smoking status: Former    Types: Cigarettes    Quit date: 06/20/2018    Years since quitting: 2.5   Smokeless tobacco: Never  Vaping Use   Vaping Use: Never used  Substance and Sexual Activity   Alcohol use: No   Drug use: Yes    Types: Marijuana   Sexual activity: Yes  Other Topics Concern   Not on file  Social History Narrative   Lives alone   Social Determinants of Health   Financial Resource Strain: High Risk   Difficulty of Paying Living Expenses: Hard  Food Insecurity: No Food Insecurity   Worried About Running Out of Food in the Last Year: Never true   Ran Out of Food in the Last Year: Never true  Transportation Needs: No Transportation Needs   Lack of Transportation (Medical): No   Lack of Transportation (Non-Medical): No  Physical Activity: Inactive   Days of Exercise per Week: 0 days   Minutes of Exercise per Session: 0 min  Stress: No Stress Concern Present   Feeling of Stress : Only a little  Social Connections: Moderately Isolated   Frequency of Communication with Friends and Family: Twice a week   Frequency of Social Gatherings with Friends and Family: Twice a week   Attends Religious Services: 1 to 4 times per year   Active Member of [employer] or Organizations: No   Attends Music therapist: Never   Marital Status: Divorced  Human resources officer Violence: Not At Risk   Fear of Current or Ex-Partner: No   Emotionally Abused: No   Physically Abused: No   Sexually Abused: No    FAMILY HISTORY:  Family History  Problem Relation Age of Onset   Cancer Maternal Aunt        breast ca/GYN    CURRENT MEDICATIONS:  Current Outpatient Medications  Medication Sig Dispense Refill   albuterol (VENTOLIN HFA) 108 (90 Base) MCG/ACT inhaler Inhale 2 puffs into the lungs as needed. 2 puffs into lungs every  6 hours as needed for wheezing or SOB     apixaban (ELIQUIS) 5 MG TABS tablet Take 1 tablet (5 mg total) by mouth 2 (two) times daily. 60 tablet 5   clonazePAM (KLONOPIN) 1 MG tablet Take 1 mg by mouth 3 (three) times daily as needed for anxiety.     cyclobenzaprine (FLEXERIL) 5 MG tablet Take 1 tablet by mouth 3 (three) times daily as needed.     docusate sodium (COLACE) 100 MG capsule Take 100 mg by mouth 2 (two) times daily.     dronabinol (MARINOL) 5 MG capsule Take 1 capsule (5 mg total) by mouth 2 (two) times daily before lunch and supper. 60 capsule 1   ergocalciferol (VITAMIN D2) 1.25 MG (50000 UT) capsule Take 1 capsule (50,000 Units total) by mouth once a week. 12 capsule 1   furosemide (LASIX) 20 MG tablet TAKE 2 TABLETS (40 MG TOTAL) BY MOUTH DAILY AS NEEDED FOR FLUID OR EDEMA. 60 tablet 1   gabapentin (NEURONTIN) 300 MG capsule Take 3 capsules (900 mg total) by mouth 3 (three) times daily. 270 capsule 4   HYDROmorphone (DILAUDID) 4 MG tablet Take 1 tablet (4 mg total) by mouth every 4 (four) hours as needed for severe pain. 180 tablet 0   ibuprofen (ADVIL) 600 MG tablet Take 600 mg by mouth every 6 (six) hours as needed.     lisinopril-hydrochlorothiazide (ZESTORETIC) 20-12.5 MG tablet Take 1 tablet by mouth 2 (two) times daily.     loperamide (IMODIUM) 2 MG capsule Take 1 capsule (2 mg total) by mouth as needed for diarrhea or loose stools. 60 capsule 3   magnesium oxide (MAG-OX) 400 (240 Mg) MG tablet Take 1 tablet (400 mg total) by mouth 3 (three) times daily. 90 tablet 3   morphine (MS CONTIN) 30 MG 12 hr tablet Take 1 tablet (30 mg total) by mouth every 8 (eight) hours. 90 tablet 0   naloxone (NARCAN) 2 MG/2ML injection Place 2 mg into the nose as needed.      OLANZapine (ZYPREXA) 10 MG tablet Take 10 mg by mouth at bedtime.     ondansetron (ZOFRAN) 8 MG tablet Take 1 tablet (8 mg total) by mouth every 8 (eight) hours as needed for nausea or vomiting. 60 tablet 3   polyethylene  glycol (MIRALAX / GLYCOLAX) 17 g packet Take  17 g by mouth 2 (two) times daily. 100 each 11   potassium chloride (KLOR-CON M10) 10 MEQ tablet TAKE 2 TABLETS BY MOUTH DAILY 180 tablet 1   rosuvastatin (CRESTOR) 40 MG tablet Take 40 mg by mouth daily.     senna (SENOKOT) 8.6 MG tablet Take 2 tablets (17.2 mg total) by mouth 3 (three) times daily. 90 tablet 1   ondansetron (ZOFRAN-ODT) 8 MG disintegrating tablet Take 1 tablet (8 mg total) by mouth every 8 (eight) hours as needed for nausea or vomiting. (Patient not taking: Reported on 01/18/2021) 60 tablet 1   prochlorperazine (COMPAZINE) 10 MG tablet Take 1 tablet (10 mg total) by mouth every 6 (six) hours as needed (Nausea or vomiting). (Patient not taking: Reported on 01/18/2021) 30 tablet 1   promethazine (PHENERGAN) 12.5 MG tablet Take 1 tablet (12.5 mg total) by mouth every 6 (six) hours as needed for nausea or vomiting. (Patient not taking: Reported on 01/18/2021) 60 tablet 3   No current facility-administered medications for this visit.   Facility-Administered Medications Ordered in Other Visits  Medication Dose Route Frequency Provider Last Rate Last Admin   heparin lock flush 100 unit/mL  500 Units Intracatheter Once Alvy Bimler, Ni, MD        ALLERGIES:  Allergies  Allergen Reactions   Latex Rash and Hives   Sulfamethoxazole-Trimethoprim Other (See Comments)    Reaction:  Unknown  Other reaction(s): Unknown When 18, dr told her not to take    PHYSICAL EXAM:  Performance status (ECOG): 1 - Symptomatic but completely ambulatory  Vitals:   01/18/21 0853  BP: 96/64  Pulse: 91  Resp: 18  Temp: (!) 96.8 F (36 C)  SpO2: 95%   Wt Readings from Last 3 Encounters:  01/18/21 184 lb 4.8 oz (83.6 kg)  12/28/20 190 lb 3.2 oz (86.3 kg)  12/03/20 189 lb 3.2 oz (85.8 kg)   Physical Exam Vitals reviewed.  Constitutional:      Appearance: Normal appearance.  Cardiovascular:     Rate and Rhythm: Normal rate and regular rhythm.     Pulses:  Normal pulses.     Heart sounds: Normal heart sounds.  Pulmonary:     Effort: Pulmonary effort is normal.     Breath sounds: Normal breath sounds.  Musculoskeletal:     Right lower leg: No edema.     Left lower leg: No edema.  Neurological:     General: No focal deficit present.     Mental Status: She is alert and oriented to person, place, and time.  Psychiatric:        Mood and Affect: Mood normal.        Behavior: Behavior normal.    LABORATORY DATA:  I have reviewed the labs as listed.  CBC Latest Ref Rng & Units 01/18/2021 12/28/2020 12/03/2020  WBC 4.0 - 10.5 K/uL 7.1 5.6 5.5  Hemoglobin 12.0 - 15.0 g/dL 11.6(L) 12.0 12.4  Hematocrit 36.0 - 46.0 % 35.5(L) 37.3 38.1  Platelets 150 - 400 K/uL 217 218 183   CMP Latest Ref Rng & Units 12/28/2020 12/03/2020 11/18/2020  Glucose 70 - 99 mg/dL 141(H) 187(H) 127(H)  BUN 6 - 20 mg/dL '14 15 13  '$ Creatinine 0.44 - 1.00 mg/dL 0.76 0.73 0.66  Sodium 135 - 145 mmol/L 137 133(L) 136  Potassium 3.5 - 5.1 mmol/L 3.6 3.3(L) 3.5  Chloride 98 - 111 mmol/L 101 98 99  CO2 22 - 32 mmol/L '27 27 27  '$ Calcium 8.9 -  10.3 mg/dL 8.7(L) 8.8(L) 9.4  Total Protein 6.5 - 8.1 g/dL 6.9 6.9 7.0  Total Bilirubin 0.3 - 1.2 mg/dL 0.4 0.3 0.4  Alkaline Phos 38 - 126 U/L 94 83 74  AST 15 - 41 U/L '20 22 19  '$ ALT 0 - 44 U/L '16 16 16    '$ DIAGNOSTIC IMAGING:  I have independently reviewed the scans and discussed with the patient. CT CHEST ABDOMEN PELVIS W CONTRAST  Result Date: 01/17/2021 CLINICAL DATA:  Ovarian cancer. Evaluate treatment response. Started chemotherapy 3-4 months ago. EXAM: CT CHEST, ABDOMEN, AND PELVIS WITH CONTRAST TECHNIQUE: Multidetector CT imaging of the chest, abdomen and pelvis was performed following the standard protocol during bolus administration of intravenous contrast. CONTRAST:  71m OMNIPAQUE IOHEXOL 300 MG/ML  SOLN COMPARISON:  10/24/2020 abdominopelvic CT. Chest abdomen and pelvic staging CTs of 09/02/2020. FINDINGS: CT CHEST FINDINGS  Cardiovascular: Left Port-A-Cath tip at mid right atrium. Aortic atherosclerosis. Normal heart size, without pericardial effusion. Lad coronary artery calcification. No central pulmonary embolism, on this non-dedicated study. Mediastinum/Nodes: No supraclavicular adenopathy. No axillary adenopathy. No mediastinal or hilar adenopathy. Lungs/Pleura: No right-sided pleural effusion. Partially loculated left-sided pleural effusion is small and similar to 09/02/2020 and 10/24/2020. Again identified is irregularity along the pleural surface with areas of calcification, relatively similar. Mild centrilobular emphysema. Left base rounded atelectasis. Musculoskeletal: Similar lateral left breast nodularity at 2.1 cm. A nodule inferior and medial to this measures 8 mm on 34/2 and is new. Sternal manubrial 9 mm sclerotic lesion is similar. CT ABDOMEN PELVIS FINDINGS Hepatobiliary: Irregular hepatic capsule, suspicious for cirrhosis. Vague area of hypoattenuation within segment 2 on 54/2 measures 1.4 cm today and is similar to on the prior. The hyperattenuating tiny hepatic dome lesion on the prior exam is less distinct today at 4 mm on 44/2. Cholecystectomy, without biliary ductal dilatation. Pancreas: Atrophic versus developmentally/surgically absent pancreatic tail. Spleen: Extensive capsular calcifications throughout the spleen are unchanged. No splenomegaly. Adrenals/Urinary Tract: Normal adrenal glands. Normal right kidney. Mild left-sided caliectasis is new. No definite cause identified. Normal urinary bladder. Stomach/Bowel: Normal stomach, without wall thickening. Colonic stool burden suggests constipation. Normal terminal ileum. Normal small bowel caliber. Vascular/Lymphatic: Advanced aortic and branch vessel atherosclerosis. Infrarenal abdominal aortic dilatation at 3.5 cm on 72/2, 3.3 cm on the prior. No abdominopelvic adenopathy. Reproductive: Hysterectomy.  No adnexal mass. Other: No significant free fluid. Again  identified are primarily calcified peritoneal and omental nodules. the index area of partially calcified left omental soft tissue thickening measures 1.3 cm on 63/2 and is similar to minimally increased compared to 1.0 cm on 10/24/2020 (when remeasured). Calcified implant on the falciform ligament measures 11 mm on 60/2 and is unchanged. Implant along the undersurface of the right inferior rectus muscle measures 4.3 x 2.1 cm on 113/2 versus 4.2 x 1.8 cm on 10/24/2020 (when remeasured). No free intraperitoneal air. Musculoskeletal: Left femoral neck sclerotic lesion is similar at 1.7 cm, favoring a bone island. Degenerative disc disease at L4-5 and L5-S1 with trace L4-5 anterolisthesis. IMPRESSION: CT CHEST IMPRESSION 1. Left-sided loculated malignant pleural effusion, similar to prior exams. 2. No new or progressive disease within the chest. 3. Aortic atherosclerosis (ICD10-I70.0) and emphysema (ICD10-J43.9). coronary artery atherosclerosis. 4. Left breast nodularity, including a more medial and inferior 8 mm nodule, nonspecific. CT ABDOMEN AND PELVIS IMPRESSION 1. Omental/peritoneal metastasis, similar to mildly progressive compared to 10/24/2020 as detailed above. No bowel obstruction or other acute complication. 2. 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 Segment 2 low-density liver lesion is nonspecific and  similar to on prior exams. 3. Aortic Atherosclerosis (ICD10-I70.0). Infrarenal aortic dilatation at 3.5 cm. Recommend follow-up ultrasound every 2 years. This recommendation follows ACR consensus guidelines: White Paper of the ACR Incidental Findings Committee II on Vascular Findings. J Am Coll Radiol 2013; 10:789-794. 4. New mild left-sided caliectasis, without cause identified. 5.  Possible constipation. Electronically Signed   By: Abigail Miyamoto M.D.   On: 01/17/2021 13:56     ASSESSMENT:  ASSESSMENT:  1.  Stage IVa low-grade ovarian cancer: -05/05/2016-exploratory laparotomy, posterior exenteration, argon beam ablation of  peritoneum, diaphragm stripping, cholecystectomy, omentectomy, appendectomy and optimal tumor debulking by Dr. Carrolyn Leigh at [facility]. -06/24/2016: Started Taxol/carboplatin.  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  Taxol discontinued cycle 5. -10/24/2016: Started maintenance letrozole. -March 2020 bevacizumab started and completed 22 cycles until 12/02/2019, discontinued due to TIA and uncontrolled hypertension. -Germline mutation testing reportedly done [facility] was negative. -She is under the care of Dr. Alvy Bimler from July 2021 -CTAP on 04/03/2020 with stable appearance of calcified omental and mesenteric nodules.  Soft tissue fullness in the right adnexal region is stable. -MRI of the left hip on 04/23/2020 with 18 mm bone lesion in the peripheral superior aspect of the left greater trochanter -XRT to left femur lesion, 30 Gray from 05/06/2020 through 05/20/2020. -CTAP on 06/24/2020 with overall progression of calcified and noncalcified peritoneal surface and omental lesions with slight progression of disease at the left lung base involving the pleura.  Stable sclerotic bone lesion involving the left greater trochanter. -3 cycles of carboplatin and gemcitabine from 07/03/2020 through 08/17/2020. -CT CAP on 09/02/2020 showed right hilar lymph node slightly increased in size.  Calcified left lower paratracheal lymph node also slightly increased in size.  CT of the chest was compared to prior CT scan from 04/03/2020.  CTAP showed hypodense lesion in the dome of the right hepatic lobe is mildly hyperdense measuring 1.1 x 0.8 cm, previously the same.  Hypodense lesion in the lateral segment of the left hepatic lobe measures 2 x 1.7 cm, formerly 1.8 x 1.3 cm. -CA-125 has steadily improved.   2.  Social/family history: -Lives by herself.  Worked as a Land.  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  Quit smoking 2 years ago.   3.  Abdominal aneurysm: -CTAP on 06/24/2020 shows stable 3.4 x  3.3 cm infrarenal abdominal aortic aneurysm. -Used to follow with vascular surgery at [facility].   4.  Saddle pulmonary embolism: -CT angiogram of the chest on 10/27/2020 at [facility] showed saddle pulmonary embolus with extension into the right upper lobe, lower lobe and middle lobe pulmonary arteries as well as areas of segmental and subsegmental extension.  Left pulmonary artery competent appears to involve the left main pulmonary artery with areas of segmental and subsegmental extension into the left lower lobe.  Small left pleural effusion.  Findings concerning for right heart strain.  Small left pleural effusion. - Left leg Doppler on 10/27/2020 was negative for thrombosis. - Lovenox followed by Eliquis which was started on 11/02/2020.   PLAN:  1.  Stage IVa low-grade ovarian cancer: - Last CA125 has increased to 310 on 12/03/2020. - We reviewed results of CT CAP from 01/15/2021 which showed mildly progressive omental/peritoneal metastasis.  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  No progressive disease in the chest.  Left-sided loculated malignant pleural effusion is stable. - I reviewed her labs today which showed normal LFTs and creatinine.  CBC was normal. - I have recommended restarting  gemcitabine at low-dose of 500 mg/M2.  If she tolerates well, will increase it to 640 mg/m2 on days 1 and 8.  She will proceed with cycle 8 today. - RTC 3 weeks for follow-up.   2.  Back pain/neuropathy: - Continue MS Contin 30 mg every 8 hours. - Continue Dilaudid 4 mg every 4 hours as needed. - Continue gabapentin 900 mg 3 times daily.   3.  Hypertension: - Continue lisinopril/HCTZ 20/12.5 as needed.  Blood pressure today is 93/64.   4.  Nausea/vomiting: - Continue Phenergan 12.5 mg every 6 hours as needed.  She will take Zofran 8 mg every 8 hours as backup.   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  No bleeding issues reported. - Continue oxygen via nasal cannula as needed on exertion. -  CT CAP on 01/15/2021 did not show any central pulmonary embolism.   6.  Hypokalemia: - Continue potassium 20 mEq daily.  Potassium today is 3.7.  7.  Hypomagnesemia: - Continue magnesium 3 times daily.  8.  Loss of appetite: - She is drinking about 2 cans of boost/Ensure daily.  She is eating 1 meal per day.  She lost few pounds in the last 3 weeks. - I have sent prescription for Marinol 5 mg twice daily.   Orders placed this encounter:  No orders of the defined types were placed in this encounter.    Derek Jack, MD [place] [phone]   I, Thana Ates, am acting as a scribe for Dr. Derek Jack.  I, Derek Jack MD, have reviewed the above documentation for accuracy and completeness, and I agree with the above.

## 2021-01-18 ENCOUNTER — Inpatient Hospital Stay (HOSPITAL_COMMUNITY): Payer: Medicare Other

## 2021-01-18 ENCOUNTER — Inpatient Hospital Stay (HOSPITAL_BASED_OUTPATIENT_CLINIC_OR_DEPARTMENT_OTHER): Payer: Medicare Other | Admitting: Hematology

## 2021-01-18 ENCOUNTER — Other Ambulatory Visit: Payer: Self-pay

## 2021-01-18 ENCOUNTER — Inpatient Hospital Stay (HOSPITAL_COMMUNITY): Payer: Medicare Other | Attending: Hematology

## 2021-01-18 VITALS — BP 114/81 | HR 95 | Temp 96.8°F | Resp 18

## 2021-01-18 VITALS — BP 96/64 | HR 91 | Temp 96.8°F | Resp 18 | Wt 184.3 lb

## 2021-01-18 DIAGNOSIS — Z1231 Encounter for screening mammogram for malignant neoplasm of breast: Secondary | ICD-10-CM | POA: Diagnosis not present

## 2021-01-18 DIAGNOSIS — C786 Secondary malignant neoplasm of retroperitoneum and peritoneum: Secondary | ICD-10-CM | POA: Insufficient documentation

## 2021-01-18 DIAGNOSIS — C7951 Secondary malignant neoplasm of bone: Secondary | ICD-10-CM | POA: Diagnosis not present

## 2021-01-18 DIAGNOSIS — Z7901 Long term (current) use of anticoagulants: Secondary | ICD-10-CM | POA: Diagnosis not present

## 2021-01-18 DIAGNOSIS — I509 Heart failure, unspecified: Secondary | ICD-10-CM | POA: Insufficient documentation

## 2021-01-18 DIAGNOSIS — Z8673 Personal history of transient ischemic attack (TIA), and cerebral infarction without residual deficits: Secondary | ICD-10-CM | POA: Insufficient documentation

## 2021-01-18 DIAGNOSIS — I10 Essential (primary) hypertension: Secondary | ICD-10-CM | POA: Insufficient documentation

## 2021-01-18 DIAGNOSIS — M549 Dorsalgia, unspecified: Secondary | ICD-10-CM | POA: Insufficient documentation

## 2021-01-18 DIAGNOSIS — Z79899 Other long term (current) drug therapy: Secondary | ICD-10-CM | POA: Diagnosis not present

## 2021-01-18 DIAGNOSIS — C569 Malignant neoplasm of unspecified ovary: Secondary | ICD-10-CM | POA: Insufficient documentation

## 2021-01-18 DIAGNOSIS — R630 Anorexia: Secondary | ICD-10-CM | POA: Diagnosis not present

## 2021-01-18 DIAGNOSIS — K219 Gastro-esophageal reflux disease without esophagitis: Secondary | ICD-10-CM | POA: Insufficient documentation

## 2021-01-18 DIAGNOSIS — R112 Nausea with vomiting, unspecified: Secondary | ICD-10-CM | POA: Diagnosis not present

## 2021-01-18 DIAGNOSIS — Z5111 Encounter for antineoplastic chemotherapy: Secondary | ICD-10-CM | POA: Diagnosis present

## 2021-01-18 DIAGNOSIS — I714 Abdominal aortic aneurysm, without rupture: Secondary | ICD-10-CM | POA: Insufficient documentation

## 2021-01-18 DIAGNOSIS — E876 Hypokalemia: Secondary | ICD-10-CM | POA: Insufficient documentation

## 2021-01-18 DIAGNOSIS — T451X5A Adverse effect of antineoplastic and immunosuppressive drugs, initial encounter: Secondary | ICD-10-CM

## 2021-01-18 DIAGNOSIS — Z7189 Other specified counseling: Secondary | ICD-10-CM

## 2021-01-18 DIAGNOSIS — I2692 Saddle embolus of pulmonary artery without acute cor pulmonale: Secondary | ICD-10-CM | POA: Diagnosis not present

## 2021-01-18 DIAGNOSIS — Z87891 Personal history of nicotine dependence: Secondary | ICD-10-CM | POA: Diagnosis not present

## 2021-01-18 DIAGNOSIS — G629 Polyneuropathy, unspecified: Secondary | ICD-10-CM | POA: Insufficient documentation

## 2021-01-18 LAB — CBC WITH DIFFERENTIAL/PLATELET
Abs Immature Granulocytes: 0.03 10*3/uL (ref 0.00–0.07)
Basophils Absolute: 0.1 10*3/uL (ref 0.0–0.1)
Basophils Relative: 1 %
Eosinophils Absolute: 0.2 10*3/uL (ref 0.0–0.5)
Eosinophils Relative: 3 %
HCT: 35.5 % — ABNORMAL LOW (ref 36.0–46.0)
Hemoglobin: 11.6 g/dL — ABNORMAL LOW (ref 12.0–15.0)
Immature Granulocytes: 0 %
Lymphocytes Relative: 34 %
Lymphs Abs: 2.5 10*3/uL (ref 0.7–4.0)
MCH: 30.3 pg (ref 26.0–34.0)
MCHC: 32.7 g/dL (ref 30.0–36.0)
MCV: 92.7 fL (ref 80.0–100.0)
Monocytes Absolute: 0.7 10*3/uL (ref 0.1–1.0)
Monocytes Relative: 9 %
Neutro Abs: 3.7 10*3/uL (ref 1.7–7.7)
Neutrophils Relative %: 53 %
Platelets: 217 10*3/uL (ref 150–400)
RBC: 3.83 MIL/uL — ABNORMAL LOW (ref 3.87–5.11)
RDW: 15.3 % (ref 11.5–15.5)
WBC: 7.1 10*3/uL (ref 4.0–10.5)
nRBC: 0 % (ref 0.0–0.2)

## 2021-01-18 LAB — COMPREHENSIVE METABOLIC PANEL
ALT: 14 U/L (ref 0–44)
AST: 17 U/L (ref 15–41)
Albumin: 3.9 g/dL (ref 3.5–5.0)
Alkaline Phosphatase: 95 U/L (ref 38–126)
Anion gap: 7 (ref 5–15)
BUN: 24 mg/dL — ABNORMAL HIGH (ref 6–20)
CO2: 28 mmol/L (ref 22–32)
Calcium: 9 mg/dL (ref 8.9–10.3)
Chloride: 101 mmol/L (ref 98–111)
Creatinine, Ser: 0.91 mg/dL (ref 0.44–1.00)
GFR, Estimated: >60 mL/min (ref >60)
Glucose, Bld: 110 mg/dL — ABNORMAL HIGH (ref 70–99)
Potassium: 3.7 mmol/L (ref 3.5–5.1)
Sodium: 136 mmol/L (ref 135–145)
Total Bilirubin: 0.4 mg/dL (ref 0.3–1.2)
Total Protein: 7 g/dL (ref 6.5–8.1)

## 2021-01-18 MED ORDER — SODIUM CHLORIDE 0.9 % IV SOLN
10.0000 mg | Freq: Once | INTRAVENOUS | Status: AC
  Administered 2021-01-18: 10 mg via INTRAVENOUS
  Filled 2021-01-18: qty 10

## 2021-01-18 MED ORDER — LORAZEPAM 2 MG/ML IJ SOLN
INTRAMUSCULAR | Status: AC
  Filled 2021-01-18: qty 1

## 2021-01-18 MED ORDER — FAMOTIDINE 20 MG IN NS 100 ML IVPB
20.0000 mg | Freq: Once | INTRAVENOUS | Status: AC
  Administered 2021-01-18: 20 mg via INTRAVENOUS
  Filled 2021-01-18: qty 100

## 2021-01-18 MED ORDER — DRONABINOL 5 MG PO CAPS: 5.0000 mg | ORAL_CAPSULE | Freq: Two times a day (BID) | ORAL | 1 refills | Status: AC

## 2021-01-18 MED ORDER — LORAZEPAM 2 MG/ML IJ SOLN
0.5000 mg | Freq: Once | INTRAMUSCULAR | Status: AC
  Administered 2021-01-18: 0.5 mg via INTRAVENOUS

## 2021-01-18 MED ORDER — HEPARIN SOD (PORK) LOCK FLUSH 100 UNIT/ML IV SOLN
500.0000 [IU] | Freq: Once | INTRAVENOUS | Status: AC | PRN
  Administered 2021-01-18: 500 [IU]

## 2021-01-18 MED ORDER — SODIUM CHLORIDE 0.9 % IV SOLN: Freq: Once | INTRAVENOUS | Status: AC

## 2021-01-18 MED ORDER — PANTOPRAZOLE SODIUM 40 MG PO TBEC: 40.0000 mg | DELAYED_RELEASE_TABLET | Freq: Every day | ORAL | 6 refills | Status: DC

## 2021-01-18 MED ORDER — SODIUM CHLORIDE 0.9 % IV SOLN
150.0000 mg | Freq: Once | INTRAVENOUS | Status: AC
  Administered 2021-01-18: 150 mg via INTRAVENOUS
  Filled 2021-01-18: qty 150

## 2021-01-18 MED ORDER — SODIUM CHLORIDE 0.9% FLUSH
10.0000 mL | INTRAVENOUS | Status: DC | PRN
  Administered 2021-01-18: 10 mL

## 2021-01-18 MED ORDER — SODIUM CHLORIDE 0.9 % IV SOLN
500.0000 mg/m2 | Freq: Once | INTRAVENOUS | Status: AC
  Administered 2021-01-18: 1026 mg via INTRAVENOUS
  Filled 2021-01-18: qty 26.98

## 2021-01-18 MED ORDER — PALONOSETRON HCL INJECTION 0.25 MG/5ML
0.2500 mg | Freq: Once | INTRAVENOUS | Status: AC
  Administered 2021-01-18: 0.25 mg via INTRAVENOUS
  Filled 2021-01-18: qty 5

## 2021-01-18 MED ORDER — ERGOCALCIFEROL 1.25 MG (50000 UT) PO CAPS: 50000.0000 [IU] | ORAL_CAPSULE | ORAL | 1 refills | Status: AC

## 2021-01-18 MED ORDER — SODIUM CHLORIDE 0.9 % IV SOLN
480.0000 mg | Freq: Once | INTRAVENOUS | Status: AC
  Administered 2021-01-18: 480 mg via INTRAVENOUS
  Filled 2021-01-18: qty 48

## 2021-01-18 MED ORDER — DIPHENHYDRAMINE HCL 50 MG/ML IJ SOLN
50.0000 mg | Freq: Once | INTRAMUSCULAR | Status: AC
  Administered 2021-01-18: 50 mg via INTRAVENOUS

## 2021-01-18 MED ORDER — DIPHENHYDRAMINE HCL 50 MG/ML IJ SOLN
INTRAMUSCULAR | Status: AC
  Filled 2021-01-18: qty 1

## 2021-01-18 NOTE — Patient Instructions (Addendum)
[facility] at [facility] Discharge Instructions  You were seen today by Dr. Katragadda. He went over your recent results and scans, and you received your treatment. Dr. Katragadda will see you back in 3 weeks for labs and follow up.   Thank you for choosing [facility] at [facility] to provide your oncology and hematology care.  To afford each patient quality time with our provider, please arrive at least 15 minutes before your scheduled appointment time.   If you have a lab appointment with the Cancer Center please come in thru the Main Entrance and check in at the main information desk  You need to re-schedule your appointment should you arrive 10 or more minutes late.  We strive to give you quality time with our providers, and arriving late affects you and other patients whose appointments are after yours.  Also, if you no show three or more times for appointments you may be dismissed from the clinic at the providers discretion.     Again, thank you for choosing [facility].  Our hope is that these requests will decrease the amount of time that you wait before being seen by our physicians.       _____________________________________________________________  Should you have questions after your visit to  Cancer Center, please contact our office at [phone] between the hours of 8:00 a.m. and 4:30 p.m.  Voicemails left after 4:00 p.m. will not be returned until the following business day.  For prescription refill requests, have your pharmacy contact our office and allow 72 hours.    Cancer Center Support Programs:   > Cancer Support Group  2nd Tuesday of the month 1pm-2pm, Journey Room   

## 2021-01-18 NOTE — Progress Notes (Signed)
Patient presents today for Gemzar and Carboplatin infusion per providers orders.  Vitals signs within parameters for treatment.  Labs pending.  Patient states that she was sick for a couple of days last week with loss of appetite and abdominal pain, but is better now.  Labs reviewed and within parameters for treatment.    Gemzar and Carboplatin given today per MD orders.  Tolerated infusion without adverse affects.  Vital signs stable.  No complaints at this time.  Discharge from clinic ambulatory in stable condition.  Alert and oriented X 3.  Follow up with [facility] as scheduled.

## 2021-01-18 NOTE — Progress Notes (Signed)
Patient has been assessed, vital signs and labs have been reviewed by Dr. Katragadda. ANC, Creatinine, LFTs, and Platelets are within treatment parameters per Dr. Katragadda. The patient is good to proceed with treatment at this time. Primary RN and pharmacy aware.  

## 2021-01-18 NOTE — Progress Notes (Signed)
Patients port flushed without difficulty.  Good blood return noted with no bruising or swelling noted at site.  Stable during access and blood draw.  Patient to remain accessed for treatment. 

## 2021-01-18 NOTE — Patient Instructions (Signed)
[facility]  Discharge Instructions: Thank you for choosing [facility] to provide your oncology and hematology care.  If you have a lab appointment with the [facility], please come in thru the Main Entrance and check in at the main information desk.  Wear comfortable clothing and clothing appropriate for easy access to any Portacath or PICC line.   We strive to give you quality time with your provider. You may need to reschedule your appointment if you arrive late (15 or more minutes).  Arriving late affects you and other patients whose appointments are after yours.  Also, if you miss three or more appointments without notifying the office, you may be dismissed from the clinic at the provider's discretion.      For prescription refill requests, have your pharmacy contact our office and allow 72 hours for refills to be completed.    Today you received the following chemotherapy and/or immunotherapy agents Gemzar and Carboplatin      To help prevent nausea and vomiting after your treatment, we encourage you to take your nausea medication as directed.  BELOW ARE SYMPTOMS THAT SHOULD BE REPORTED IMMEDIATELY: *FEVER GREATER THAN 100.4 F (38 C) OR HIGHER *CHILLS OR SWEATING *NAUSEA AND VOMITING THAT IS NOT CONTROLLED WITH YOUR NAUSEA MEDICATION *UNUSUAL SHORTNESS OF BREATH *UNUSUAL BRUISING OR BLEEDING *URINARY PROBLEMS (pain or burning when urinating, or frequent urination) *BOWEL PROBLEMS (unusual diarrhea, constipation, pain near the anus) TENDERNESS IN MOUTH AND THROAT WITH OR WITHOUT PRESENCE OF ULCERS (sore throat, sores in mouth, or a toothache) UNUSUAL RASH, SWELLING OR PAIN  UNUSUAL VAGINAL DISCHARGE OR ITCHING   Items with * indicate a potential emergency and should be followed up as soon as possible or go to the Emergency Department if any problems should occur.  Please show the CHEMOTHERAPY ALERT CARD or IMMUNOTHERAPY ALERT CARD at check-in to the  Emergency Department and triage nurse.  Should you have questions after your visit or need to cancel or reschedule your appointment, please contact [facility] [phone]  and follow the prompts.  Office hours are 8:00 a.m. to 4:30 p.m. Monday - Friday. Please note that voicemails left after 4:00 p.m. may not be returned until the following business day.  We are closed weekends and major holidays. You have access to a nurse at all times for urgent questions. Please call the main number to the clinic [phone] and follow the prompts.  For any non-urgent questions, you may also contact your provider using MyChart. We now offer e-Visits for anyone 51 and older to request care online for non-urgent symptoms. For details visit [email or web address].   Also download the MyChart app! Go to the app store, search "MyChart", open the app, select [facility], and log in with your MyChart username and password.  Due to Covid, a mask is required upon entering the hospital/clinic. If you do not have a mask, one will be given to you upon arrival. For doctor visits, patients may have 1 support person aged 51 or older with them. For treatment visits, patients cannot have anyone with them due to current Covid guidelines and our immunocompromised population.

## 2021-01-19 LAB — CA 125: Cancer Antigen (CA) 125: 334 U/mL — ABNORMAL HIGH (ref 0.0–38.1)

## 2021-01-25 ENCOUNTER — Inpatient Hospital Stay (HOSPITAL_COMMUNITY): Payer: Medicare Other

## 2021-01-25 ENCOUNTER — Other Ambulatory Visit: Payer: Self-pay

## 2021-01-25 VITALS — BP 126/85 | HR 97 | Temp 98.1°F | Resp 18

## 2021-01-25 DIAGNOSIS — C569 Malignant neoplasm of unspecified ovary: Secondary | ICD-10-CM

## 2021-01-25 DIAGNOSIS — C786 Secondary malignant neoplasm of retroperitoneum and peritoneum: Secondary | ICD-10-CM

## 2021-01-25 DIAGNOSIS — Z5111 Encounter for antineoplastic chemotherapy: Secondary | ICD-10-CM | POA: Diagnosis not present

## 2021-01-25 DIAGNOSIS — Z7189 Other specified counseling: Secondary | ICD-10-CM

## 2021-01-25 LAB — CBC WITH DIFFERENTIAL/PLATELET
Abs Immature Granulocytes: 0.03 10*3/uL (ref 0.00–0.07)
Basophils Absolute: 0 10*3/uL (ref 0.0–0.1)
Basophils Relative: 1 %
Eosinophils Absolute: 0 10*3/uL (ref 0.0–0.5)
Eosinophils Relative: 1 %
HCT: 34.6 % — ABNORMAL LOW (ref 36.0–46.0)
Hemoglobin: 11.7 g/dL — ABNORMAL LOW (ref 12.0–15.0)
Immature Granulocytes: 1 %
Lymphocytes Relative: 27 %
Lymphs Abs: 1.5 10*3/uL (ref 0.7–4.0)
MCH: 31 pg (ref 26.0–34.0)
MCHC: 33.8 g/dL (ref 30.0–36.0)
MCV: 91.5 fL (ref 80.0–100.0)
Monocytes Absolute: 0.2 10*3/uL (ref 0.1–1.0)
Monocytes Relative: 4 %
Neutro Abs: 3.8 10*3/uL (ref 1.7–7.7)
Neutrophils Relative %: 66 %
Platelets: 136 10*3/uL — ABNORMAL LOW (ref 150–400)
RBC: 3.78 MIL/uL — ABNORMAL LOW (ref 3.87–5.11)
RDW: 14.3 % (ref 11.5–15.5)
WBC: 5.7 10*3/uL (ref 4.0–10.5)
nRBC: 0 % (ref 0.0–0.2)

## 2021-01-25 LAB — COMPREHENSIVE METABOLIC PANEL
ALT: 27 U/L (ref 0–44)
AST: 23 U/L (ref 15–41)
Albumin: 3.9 g/dL (ref 3.5–5.0)
Alkaline Phosphatase: 114 U/L (ref 38–126)
Anion gap: 6 (ref 5–15)
BUN: 18 mg/dL (ref 6–20)
CO2: 28 mmol/L (ref 22–32)
Calcium: 9.2 mg/dL (ref 8.9–10.3)
Chloride: 102 mmol/L (ref 98–111)
Creatinine, Ser: 0.68 mg/dL (ref 0.44–1.00)
GFR, Estimated: >60 mL/min (ref >60)
Glucose, Bld: 98 mg/dL (ref 70–99)
Potassium: 3.9 mmol/L (ref 3.5–5.1)
Sodium: 136 mmol/L (ref 135–145)
Total Bilirubin: 0.4 mg/dL (ref 0.3–1.2)
Total Protein: 7.3 g/dL (ref 6.5–8.1)

## 2021-01-25 MED ORDER — HEPARIN SOD (PORK) LOCK FLUSH 100 UNIT/ML IV SOLN
500.0000 [IU] | Freq: Once | INTRAVENOUS | Status: AC | PRN
  Administered 2021-01-25: 500 [IU]

## 2021-01-25 MED ORDER — SODIUM CHLORIDE 0.9 % IV SOLN: Freq: Once | INTRAVENOUS | Status: AC

## 2021-01-25 MED ORDER — SODIUM CHLORIDE 0.9% FLUSH
10.0000 mL | INTRAVENOUS | Status: DC | PRN
  Administered 2021-01-25: 10 mL

## 2021-01-25 MED ORDER — SODIUM CHLORIDE 0.9 % IV SOLN
500.0000 mg/m2 | Freq: Once | INTRAVENOUS | Status: AC
  Administered 2021-01-25: 1026 mg via INTRAVENOUS
  Filled 2021-01-25: qty 26.98

## 2021-01-25 MED ORDER — PALONOSETRON HCL INJECTION 0.25 MG/5ML
0.2500 mg | Freq: Once | INTRAVENOUS | Status: AC
  Administered 2021-01-25: 0.25 mg via INTRAVENOUS

## 2021-01-25 MED ORDER — PALONOSETRON HCL INJECTION 0.25 MG/5ML
INTRAVENOUS | Status: AC
  Filled 2021-01-25: qty 5

## 2021-01-25 NOTE — Progress Notes (Signed)
Patient presents today for chemotherapy treatment.  Labs meet parameters for treatment.    Patient tolerated treatment well with no complaints voiced.  Patient left via wheelchair in stable condition.  Vital signs stable at discharge.  Follow up as scheduled.

## 2021-01-25 NOTE — Patient Instructions (Signed)
[facility]  Discharge Instructions: Thank you for choosing [facility] to provide your oncology and hematology care.  If you have a lab appointment with the Cancer Center, please come in thru the Main Entrance and check in at the main information desk.  Wear comfortable clothing and clothing appropriate for easy access to any Portacath or PICC line.   We strive to give you quality time with your provider. You may need to reschedule your appointment if you arrive late (15 or more minutes).  Arriving late affects you and other patients whose appointments are after yours.  Also, if you miss three or more appointments without notifying the office, you may be dismissed from the clinic at the provider's discretion.      For prescription refill requests, have your pharmacy contact our office and allow 72 hours for refills to be completed.        To help prevent nausea and vomiting after your treatment, we encourage you to take your nausea medication as directed.  BELOW ARE SYMPTOMS THAT SHOULD BE REPORTED IMMEDIATELY: *FEVER GREATER THAN 100.4 F (38 C) OR HIGHER *CHILLS OR SWEATING *NAUSEA AND VOMITING THAT IS NOT CONTROLLED WITH YOUR NAUSEA MEDICATION *UNUSUAL SHORTNESS OF BREATH *UNUSUAL BRUISING OR BLEEDING *URINARY PROBLEMS (pain or burning when urinating, or frequent urination) *BOWEL PROBLEMS (unusual diarrhea, constipation, pain near the anus) TENDERNESS IN MOUTH AND THROAT WITH OR WITHOUT PRESENCE OF ULCERS (sore throat, sores in mouth, or a toothache) UNUSUAL RASH, SWELLING OR PAIN  UNUSUAL VAGINAL DISCHARGE OR ITCHING   Items with * indicate a potential emergency and should be followed up as soon as possible or go to the Emergency Department if any problems should occur.  Please show the CHEMOTHERAPY ALERT CARD or IMMUNOTHERAPY ALERT CARD at check-in to the Emergency Department and triage nurse.  Should you have questions after your visit or need to cancel  or reschedule your appointment, please contact [facility] [phone]  and follow the prompts.  Office hours are 8:00 a.m. to 4:30 p.m. Monday - Friday. Please note that voicemails left after 4:00 p.m. may not be returned until the following business day.  We are closed weekends and major holidays. You have access to a nurse at all times for urgent questions. Please call the main number to the clinic [phone] and follow the prompts.  For any non-urgent questions, you may also contact your provider using MyChart. We now offer e-Visits for anyone 18 and older to request care online for non-urgent symptoms. For details visit [email or web address].   Also download the MyChart app! Go to the app store, search "MyChart", open the app, select , and log in with your MyChart username and password.  Due to Covid, a mask is required upon entering the hospital/clinic. If you do not have a mask, one will be given to you upon arrival. For doctor visits, patients may have 1 support person aged 18 or older with them. For treatment visits, patients cannot have anyone with them due to current Covid guidelines and our immunocompromised population.  

## 2021-02-01 ENCOUNTER — Encounter (HOSPITAL_COMMUNITY): Payer: Self-pay

## 2021-02-01 NOTE — Progress Notes (Signed)
Dr. Geroge Baseman with [facility] called concerning patient. He is the Psychologist, sport and exercise and he said that she is not really a candidate for surgery and whatever chemo she is on now is not working. He wanted to know what her most recent treatment plan is. Informed him of treatment plan that is on the treatment flow sheet.

## 2021-02-04 ENCOUNTER — Other Ambulatory Visit (HOSPITAL_COMMUNITY): Payer: Self-pay

## 2021-02-04 DIAGNOSIS — C786 Secondary malignant neoplasm of retroperitoneum and peritoneum: Secondary | ICD-10-CM

## 2021-02-04 DIAGNOSIS — C569 Malignant neoplasm of unspecified ovary: Secondary | ICD-10-CM

## 2021-02-04 MED ORDER — MORPHINE SULFATE ER 30 MG PO TBCR: 30.0000 mg | EXTENDED_RELEASE_TABLET | Freq: Three times a day (TID) | ORAL | 0 refills | Status: DC

## 2021-02-04 MED ORDER — HYDROMORPHONE HCL 4 MG PO TABS: 4.0000 mg | ORAL_TABLET | ORAL | 0 refills | Status: DC | PRN

## 2021-02-04 MED ORDER — ONDANSETRON HCL 8 MG PO TABS: 8.0000 mg | ORAL_TABLET | Freq: Three times a day (TID) | ORAL | 3 refills | Status: DC | PRN

## 2021-02-06 NOTE — Progress Notes (Signed)
Heidi Johnson,  [id number]   CLINIC:  Medical Oncology/Hematology  PCP:  Derek Jack, 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 Exploratory laparotomy, posterior exenteration, argon beam ablation of peritoneum, omentectomy and optimal tumor debulking on 05/05/2016. 2. Carboplatin and paclitaxel x 5 cycles from 06/24/2016. 3. Avastin x 22 cycles through 12/02/2019 discontinued due to TIA and hypertension. 4. Left femur radiation 30 Gy in 10 fractions from 05/07/2020 to 05/20/2020.  NGS Results: not done  CURRENT THERAPY:  Carboplatin, gemcitabine and Aloxi every 3 weeks  BRIEF ONCOLOGIC HISTORY:  Oncology History Overview Note  Negative genetics at [facility]   Ovarian cancer ([place])  04/25/2016 Imaging   CT confirms 12cm ovarian masses with extensive peritoneal metastases and malignant pleural effusions   05/05/2016 Surgery   s/p ex-lap posterior exenteration, diaphragm stripping, cholecystectomy, omentectomy, appendectomy and optimal tumor debulking on 05/05/16   06/24/2016 - 10/24/2016 Chemotherapy   Started Taxol/Carboplatin. 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  Numerous left predominant calcified pleural metastases and associated small left pleural effusion are unchanged.  2.  Unchanged (3.0 cm) (series 3, image 96) left breast mass    02/07/2019 Imaging   Outside Ct abdomen and pelvis 1. Overall  stable small volume peritoneal tumor, as described above.  2. Unchanged infrarenal abdominal aortic aneurysm.    02/22/2019 Tumor Marker   Patient's tumor was tested for the following markers: CA-125 Results of the tumor marker test revealed 164   03/18/2019 Tumor Marker   Patient's tumor was tested for the following markers: CA-125 Results of the tumor marker test revealed 162   04/08/2019 Tumor Marker   Patient's tumor was tested for the following markers: CA-125 Results of the tumor marker test revealed 164   04/29/2019 Tumor Marker   Patient's tumor was tested for the following markers: CA-125 Results of the tumor marker test revealed 181   05/20/2019 Tumor Marker   Patient's tumor was tested for the following markers: CA-125 Results of the tumor marker test revealed 164   06/10/2019 Tumor Marker   Patient's tumor was tested for the following markers: CA-125 Results of the tumor marker test revealed 152   07/01/2019 Tumor Marker   Patient's tumor was tested for the following markers: CA-125 Results of the tumor marker test revealed 160   07/22/2019 Tumor Marker   Patient's tumor was tested for the following markers: CA-125 Results of the tumor marker test revealed 171   08/12/2019 Tumor Marker   Patient's tumor was tested for the following markers: CA-125 Results of the tumor marker test revealed 173   09/02/2019 Tumor Marker   Patient's tumor was tested for the following markers: CA-125 Results of the tumor marker test revealed 159   09/30/2019 Tumor Marker   Patient's tumor was tested for the following markers: CA-125 Results of the tumor marker test revealed 168   11/08/2019 Imaging   Outside CT imaging  1. Study was performed at outside institution on 11/08/2019 and presented for review on 11/14/2019.  2. Stable calcified and noncalcified left-sided nodular and plaque-like pleural abnormalities with small chronic left pleural effusion compatible with stable treated pleural  metastasis. No new or enlarging lesions.  3. No enlarging thoracic lymph nodes.  4. Redemonstration of left breast mass. Correlation with mammography is suggested if not already performed   12/02/2019 Tumor Marker   Patient's tumor was tested for the following markers: CA-125 Results of the tumor marker test revealed 186   12/18/2019 Cancer Staging   Staging form: Ovary, Fallopian Tube, and Primary Peritoneal Carcinoma, AJCC 8th Edition - Clinical stage from 12/18/2019: FIGO Stage IV (rcT3c, cN0, cM1) - Signed by [name], MD on 04/24/2020   01/09/2020 Tumor Marker   Patient's tumor was tested for the following markers: CA-125 Results of the tumor marker test revealed 184   01/10/2020 Imaging   1. Treated left pleural and peritoneal metastatic disease. Areas of residual soft tissue prominence in the right anatomic pelvis are indeterminate. Comparison with prior exams would be helpful. 2. Hepatomegaly. Low-attenuation lesions in the liver are too small to characterize. Comparison with prior exams would be helpful. 3. Small left fibrothorax. 4. Infrarenal Aortic aneurysm NOS (ICD10-I71.9). Recommend followup by ultrasound in 2 years.  5. Aortic atherosclerosis (ICD10-I70.0). Coronary artery calcification.   03/09/2020 Tumor Marker   Patient's tumor was tested for the following markers: CA-125 Results of the tumor marker test revealed 214   04/01/2020 Tumor Marker   Patient's tumor was tested for the following markers: CA-125 Results of the tumor marker test revealed 245.   04/03/2020 Imaging   1. No acute findings in the abdomen or pelvis. Specifically, no findings to explain the patient's history of left upper quadrant pain with nausea and vomiting. 2. Stable appearance of calcified omental and mesenteric nodules consistent with treated metastatic disease. Soft tissue fullness seen in the right adnexal region on the prior exam is similar today. 3. Stable 1.6 x 1.2 cm low-density lesion  lateral segment left liver. 4. 3.5 cm infrarenal abdominal aortic aneurysm. Recommend follow-up ultrasound every 2 years. This recommendation follows ACR consensus guidelines: White Paper of the ACR Incidental Findings Committee II on Vascular Findings. J Am Coll Radiol 2013CJ:3944253. 5. Aortic Atherosclerosis (ICD10-I70.0).   04/15/2020 Imaging   Focus of abnormal uptake is seen involving the greater trochanter of the proximal left femur which corresponds to sclerotic density seen on prior CT scan and is concerning for metastatic disease.   04/23/2020 Imaging   1. No hip fracture, dislocation or avascular necrosis. 2. A 18 mm bone lesion in the peripheral superior aspect of the left greater trochanter with enhancement on postcontrast imaging. The overall appearance is most concerning for metastatic disease in the setting of known malignancy. 3. 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 4. Degenerative disease with disc height loss at L4-5 and L5-S1 with bilateral facet arthropathy.   06/08/2020 Tumor Marker   Patient's tumor was tested for the following markers: CA-125. Results of the tumor marker test revealed 272.   06/24/2020 Imaging   1. Overall progression of calcified and noncalcified peritoneal surface and omental lesions. 2. Slight progression of disease at the left lung base and involving the pleura. 3. Stable sclerotic bone lesion involving the left greater trochanter. 4. Stable infrarenal abdominal aortic aneurysm.   07/03/2020 -  Chemotherapy    Patient is on Treatment Plan: OVARIAN RECURRENT 3RD LINE CARBOPLATIN D1 / GEMCITABINE  D1,8 (4/800) Q21D       07/03/2020 Tumor Marker   Patient's tumor was tested for the following markers: CA-125 Results of the tumor marker test revealed 285.   07/10/2020 Tumor Marker   Patient's tumor was tested for the following markers: CA-125 Results of the tumor marker test revealed 332   07/23/2020 Tumor Marker   Patient's tumor was tested  for the following markers: CA-125 Results of the tumor marker test revealed 308     CANCER STAGING: Cancer Staging Ovarian cancer [facility]) Staging form: Ovary, Fallopian Tube, and Primary Peritoneal Carcinoma, AJCC 8th Edition - Clinical stage from 12/18/2019: FIGO Stage IV (rcT3c, cN0, cM1) - Signed by [name], MD on 04/24/2020   INTERVAL HISTORY:  Heidi Johnson, a 50 y.o. female, returns for routine follow-up and consideration for next cycle of chemotherapy. Heidi Johnson was last seen on 01/18/21.  Due for cycle #9 of  Carboplatin, gemcitabine and Aloxi today.   Overall, she tells me she has been feeling pretty well. She reports she began vomiting following her treatment on the 01/18/21 which was not helped by Zofran which continued until 01/28/21 at which point she reported to the ED, and she was hospitalized for 2 days. She reports SOB and cough productive of white sputum.  She was given Z-Pak and prednisone which helped with her breathing.  She has been taking Marinol which she reports has not helped with her appetite. She is no longer taking Lasix.   Overall, she feels ready for next cycle of chemo today.   REVIEW OF SYSTEMS:  Review of Systems  Constitutional:  Positive for appetite change and fatigue (25%).  Respiratory:  Positive for cough and shortness of breath.   Cardiovascular:  Positive for palpitations.  Gastrointestinal:  Positive for nausea and vomiting.  Musculoskeletal:  Positive for arthralgias (7/10 feet), back pain (7/10) and myalgias (7/10 legs).  Neurological:  Positive for dizziness and headaches.  Psychiatric/Behavioral:  Positive for sleep disturbance.   All other systems reviewed and are negative.  PAST MEDICAL/SURGICAL HISTORY:  Past Medical History:  Diagnosis Date   Cancer ([place])    ovarian cancer   CHF (congestive heart failure) (H[facility])    Hypertension    Panic attacks    Stroke [facility])    Past Surgical History:  Procedure Laterality Date    ABDOMINAL HYSTERECTOMY     BACK SURGERY     TUBAL LIGATION      SOCIAL HISTORY:  Social History   Socioeconomic History   Marital status: Divorced    Spouse name: Not on file   Number of children: 3   Years of education: Not on file   Highest education level: Not on file  Occupational History   Occupation: retired  Tobacco Use   Smoking status: Former    Types: Cigarettes    Quit date: 06/20/2018    Years since quitting: 2.6   Smokeless tobacco: Never  Vaping Use   Vaping Use: Never used  Substance and Sexual Activity   Alcohol use: No   Drug use: Yes    Types: Marijuana   Sexual activity: Yes  Other Topics Concern   Not on file  Social History Narrative   Lives alone   Social Determinants of Health   Financial Resource Strain: High Risk   Difficulty of Paying Living Expenses: Hard  Food Insecurity: No Food Insecurity   Worried About Running Out of Food in the Last Year: Never true   Ran Out  of Food in the Last Year: Never true  Transportation Needs: No Transportation Needs   Lack of Transportation (Medical): No   Lack of Transportation (Non-Medical): No  Physical Activity: Inactive   Days of Exercise per Week: 0 days   Minutes of Exercise per Session: 0 min  Stress: No Stress Concern Present   Feeling of Stress : Only a little  Social Connections: Moderately Isolated   Frequency of Communication with Friends and Family: Twice a week   Frequency of Social Gatherings with Friends and Family: Twice a week   Attends Religious Services: 1 to 4 times per year   Active Member of [employer] or Organizations: No   Attends Music therapist: Never   Marital Status: Divorced  Human resources officer Violence: Not At Risk   Fear of Current or Ex-Partner: No   Emotionally Abused: No   Physically Abused: No   Sexually Abused: No    FAMILY HISTORY:  Family History  Problem Relation Age of Onset   Cancer Maternal Aunt        breast ca/GYN    CURRENT MEDICATIONS:   Current Outpatient Medications  Medication Sig Dispense Refill   albuterol (VENTOLIN HFA) 108 (90 Base) MCG/ACT inhaler Inhale 2 puffs into the lungs as needed. 2 puffs into lungs every 6 hours as needed for wheezing or SOB     apixaban (ELIQUIS) 5 MG TABS tablet Take 1 tablet (5 mg total) by mouth 2 (two) times daily. 60 tablet 5   azithromycin (ZITHROMAX) 500 MG tablet Take 500 mg by mouth daily.     clonazePAM (KLONOPIN) 1 MG tablet Take 1 mg by mouth 3 (three) times daily as needed for anxiety.     cyclobenzaprine (FLEXERIL) 5 MG tablet Take 1 tablet by mouth 3 (three) times daily as needed.     docusate sodium (COLACE) 100 MG capsule Take 100 mg by mouth 2 (two) times daily.     dronabinol (MARINOL) 5 MG capsule Take 1 capsule (5 mg total) by mouth 2 (two) times daily before lunch and supper. 60 capsule 1   ergocalciferol (VITAMIN D2) 1.25 MG (50000 UT) capsule Take 1 capsule (50,000 Units total) by mouth once a week. 12 capsule 1   furosemide (LASIX) 20 MG tablet TAKE 2 TABLETS (40 MG TOTAL) BY MOUTH DAILY AS NEEDED FOR FLUID OR EDEMA. 60 tablet 1   gabapentin (NEURONTIN) 300 MG capsule Take 3 capsules (900 mg total) by mouth 3 (three) times daily. 270 capsule 4   HYDROmorphone (DILAUDID) 4 MG tablet Take 1 tablet (4 mg total) by mouth every 4 (four) hours as needed for severe pain. 180 tablet 0   ibuprofen (ADVIL) 600 MG tablet Take 600 mg by mouth every 6 (six) hours as needed.     lisinopril-hydrochlorothiazide (ZESTORETIC) 20-12.5 MG tablet Take 1 tablet by mouth 2 (two) times daily.     loperamide (IMODIUM) 2 MG capsule Take 1 capsule (2 mg total) by mouth as needed for diarrhea or loose stools. 60 capsule 3   magnesium oxide (MAG-OX) 400 (240 Mg) MG tablet Take 1 tablet (400 mg total) by mouth 3 (three) times daily. 90 tablet 3   metoprolol succinate (TOPROL-XL) 25 MG 24 hr tablet Take 25 mg by mouth daily.     morphine (MS CONTIN) 30 MG 12 hr tablet Take 1 tablet (30 mg total) by  mouth every 8 (eight) hours. 90 tablet 0   naloxone (NARCAN) 2 MG/2ML injection Place 2 mg into  the nose as needed.      OLANZapine (ZYPREXA) 10 MG tablet Take 10 mg by mouth at bedtime.     ondansetron (ZOFRAN) 8 MG tablet Take 1 tablet (8 mg total) by mouth every 8 (eight) hours as needed for nausea or vomiting. 60 tablet 3   ondansetron (ZOFRAN-ODT) 8 MG disintegrating tablet Take 1 tablet (8 mg total) by mouth every 8 (eight) hours as needed for nausea or vomiting. 60 tablet 1   pantoprazole (PROTONIX) 40 MG tablet Take 1 tablet (40 mg total) by mouth daily. 30 tablet 6   polyethylene glycol (MIRALAX / GLYCOLAX) 17 g packet Take 17 g by mouth 2 (two) times daily. 100 each 11   potassium chloride (KLOR-CON M10) 10 MEQ tablet TAKE 2 TABLETS BY MOUTH DAILY 180 tablet 1   promethazine (PHENERGAN) 12.5 MG tablet Take 1 tablet (12.5 mg total) by mouth every 6 (six) hours as needed for nausea or vomiting. 60 tablet 3   rosuvastatin (CRESTOR) 40 MG tablet Take 40 mg by mouth daily.     senna (SENOKOT) 8.6 MG tablet Take 2 tablets (17.2 mg total) by mouth 3 (three) times daily. 90 tablet 1   No current facility-administered medications for this visit.   Facility-Administered Medications Ordered in Other Visits  Medication Dose Route Frequency Provider Last Rate Last Admin   heparin lock flush 100 unit/mL  500 Units Intracatheter Once [name], MD        ALLERGIES:  Allergies  Allergen Reactions   Latex Rash and Hives   Sulfamethoxazole-Trimethoprim Other (See Comments)    Reaction:  Unknown  Other reaction(s): Unknown When 85, dr told her not to take    PHYSICAL EXAM:  Performance status (ECOG): 1 - Symptomatic but completely ambulatory  There were no vitals filed for this visit. Wt Readings from Last 3 Encounters:  02/08/21 174 lb 5 oz (79.1 kg)  01/18/21 184 lb 4.8 oz (83.6 kg)  12/28/20 190 lb 3.2 oz (86.3 kg)   Physical Exam Vitals reviewed.  Constitutional:       Appearance: Normal appearance.     Interventions: Nasal cannula in place.  Cardiovascular:     Rate and Rhythm: Normal rate and regular rhythm.     Pulses: Normal pulses.     Heart sounds: Normal heart sounds.  Pulmonary:     Effort: Pulmonary effort is normal.     Breath sounds: Wheezing present.  Neurological:     General: No focal deficit present.     Mental Status: She is alert and oriented to person, place, and time.  Psychiatric:        Mood and Affect: Mood normal.        Behavior: Behavior normal.    LABORATORY DATA:  I have reviewed the labs as listed.  CBC Latest Ref Rng & Units 02/08/2021 01/25/2021 01/18/2021  WBC 4.0 - 10.5 K/uL 7.4 5.7 7.1  Hemoglobin 12.0 - 15.0 g/dL 10.4(L) 11.7(L) 11.6(L)  Hematocrit 36.0 - 46.0 % 32.8(L) 34.6(L) 35.5(L)  Platelets 150 - 400 K/uL 379 136(L) 217   CMP Latest Ref Rng & Units 02/08/2021 01/25/2021 01/18/2021  Glucose 70 - 99 mg/dL 113(H) 98 110(H)  BUN 6 - 20 mg/dL 16 18 24(H)  Creatinine 0.44 - 1.00 mg/dL 0.76 0.68 0.91  Sodium 135 - 145 mmol/L 136 136 136  Potassium 3.5 - 5.1 mmol/L 3.7 3.9 3.7  Chloride 98 - 111 mmol/L 98 102 101  CO2 22 - 32 mmol/L 29  28 28  Calcium 8.9 - 10.3 mg/dL 9.1 9.2 9.0  Total Protein 6.5 - 8.1 g/dL 7.2 7.3 7.0  Total Bilirubin 0.3 - 1.2 mg/dL 0.4 0.4 0.4  Alkaline Phos 38 - 126 U/L 95 114 95  AST 15 - 41 U/L '22 23 17  '$ ALT 0 - 44 U/L 32 27 14    DIAGNOSTIC IMAGING:  I have independently reviewed the scans and discussed with the patient. CT CHEST ABDOMEN PELVIS W CONTRAST  Result Date: 01/17/2021 CLINICAL DATA:  Ovarian cancer. Evaluate treatment response. Started chemotherapy 3-4 months ago. EXAM: CT CHEST, ABDOMEN, AND PELVIS WITH CONTRAST TECHNIQUE: Multidetector CT imaging of the chest, abdomen and pelvis was performed following the standard protocol during bolus administration of intravenous contrast. CONTRAST:  41m OMNIPAQUE IOHEXOL 300 MG/ML  SOLN COMPARISON:  10/24/2020 abdominopelvic CT. Chest  abdomen and pelvic staging CTs of 09/02/2020. FINDINGS: CT CHEST FINDINGS Cardiovascular: Left Port-A-Cath tip at mid right atrium. Aortic atherosclerosis. Normal heart size, without pericardial effusion. Lad coronary artery calcification. No central pulmonary embolism, on this non-dedicated study. Mediastinum/Nodes: No supraclavicular adenopathy. No axillary adenopathy. No mediastinal or hilar adenopathy. Lungs/Pleura: No right-sided pleural effusion. Partially loculated left-sided pleural effusion is small and similar to 09/02/2020 and 10/24/2020. Again identified is irregularity along the pleural surface with areas of calcification, relatively similar. Mild centrilobular emphysema. Left base rounded atelectasis. Musculoskeletal: Similar lateral left breast nodularity at 2.1 cm. A nodule inferior and medial to this measures 8 mm on 34/2 and is new. Sternal manubrial 9 mm sclerotic lesion is similar. CT ABDOMEN PELVIS FINDINGS Hepatobiliary: Irregular hepatic capsule, suspicious for cirrhosis. Vague area of hypoattenuation within segment 2 on 54/2 measures 1.4 cm today and is similar to on the prior. The hyperattenuating tiny hepatic dome lesion on the prior exam is less distinct today at 4 mm on 44/2. Cholecystectomy, without biliary ductal dilatation. Pancreas: Atrophic versus developmentally/surgically absent pancreatic tail. Spleen: Extensive capsular calcifications throughout the spleen are unchanged. No splenomegaly. Adrenals/Urinary Tract: Normal adrenal glands. Normal right kidney. Mild left-sided caliectasis is new. No definite cause identified. Normal urinary bladder. Stomach/Bowel: Normal stomach, without wall thickening. Colonic stool burden suggests constipation. Normal terminal ileum. Normal small bowel caliber. Vascular/Lymphatic: Advanced aortic and branch vessel atherosclerosis. Infrarenal abdominal aortic dilatation at 3.5 cm on 72/2, 3.3 cm on the prior. No abdominopelvic adenopathy.  Reproductive: Hysterectomy.  No adnexal mass. Other: No significant free fluid. Again identified are primarily calcified peritoneal and omental nodules. the index area of partially calcified left omental soft tissue thickening measures 1.3 cm on 63/2 and is similar to minimally increased compared to 1.0 cm on 10/24/2020 (when remeasured). Calcified implant on the falciform ligament measures 11 mm on 60/2 and is unchanged. Implant along the undersurface of the right inferior rectus muscle measures 4.3 x 2.1 cm on 113/2 versus 4.2 x 1.8 cm on 10/24/2020 (when remeasured). No free intraperitoneal air. Musculoskeletal: Left femoral neck sclerotic lesion is similar at 1.7 cm, favoring a bone island. Degenerative disc disease at L4-5 and L5-S1 with trace L4-5 anterolisthesis. IMPRESSION: CT CHEST IMPRESSION 1. Left-sided loculated malignant pleural effusion, similar to prior exams. 2. No new or progressive disease within the chest. 3. Aortic atherosclerosis (ICD10-I70.0) and emphysema (ICD10-J43.9). coronary artery atherosclerosis. 4. Left breast nodularity, including a more medial and inferior 8 mm nodule, nonspecific. CT ABDOMEN AND PELVIS IMPRESSION 1. Omental/peritoneal metastasis, similar to mildly progressive compared to 10/24/2020 as detailed above. No bowel obstruction or other acute complication. 2. 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 Segment 2  low-density liver lesion is nonspecific and similar to on prior exams. 3. Aortic Atherosclerosis (ICD10-I70.0). Infrarenal aortic dilatation at 3.5 cm. Recommend follow-up ultrasound every 2 years. This recommendation follows ACR consensus guidelines: White Paper of the ACR Incidental Findings Committee II on Vascular Findings. J Am Coll Radiol 2013; 10:789-794. 4. New mild left-sided caliectasis, without cause identified. 5.  Possible constipation. Electronically Signed   By: Abigail Miyamoto M.D.   On: 01/17/2021 13:56     ASSESSMENT:  1.  Stage IVa low-grade ovarian  cancer: -05/05/2016-exploratory laparotomy, posterior exenteration, argon beam ablation of peritoneum, diaphragm stripping, cholecystectomy, omentectomy, appendectomy and optimal tumor debulking by Dr. Carrolyn Leigh at [facility]. -06/24/2016: Started Taxol/carboplatin.  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  Taxol discontinued cycle 5. -10/24/2016: Started maintenance letrozole. -March 2020 bevacizumab started and completed 22 cycles until 12/02/2019, discontinued due to TIA and uncontrolled hypertension. -Germline mutation testing reportedly done at [facility] was negative. -She is under the care of Dr. Alvy Bimler from July 2021 -CTAP on 04/03/2020 with stable appearance of calcified omental and mesenteric nodules.  Soft tissue fullness in the right adnexal region is stable. -MRI of the left hip on 04/23/2020 with 18 mm bone lesion in the peripheral superior aspect of the left greater trochanter -XRT to left femur lesion, 30 Gray from 05/06/2020 through 05/20/2020. -CTAP on 06/24/2020 with overall progression of calcified and noncalcified peritoneal surface and omental lesions with slight progression of disease at the left lung base involving the pleura.  Stable sclerotic bone lesion involving the left greater trochanter. -3 cycles of carboplatin and gemcitabine from 07/03/2020 through 08/17/2020. -CT CAP on 09/02/2020 showed right hilar lymph node slightly increased in size.  Calcified left lower paratracheal lymph node also slightly increased in size.  CT of the chest was compared to prior CT scan from 04/03/2020.  CTAP showed hypodense lesion in the dome of the right hepatic lobe is mildly hyperdense measuring 1.1 x 0.8 cm, previously the same.  Hypodense lesion in the lateral segment of the left hepatic lobe measures 2 x 1.7 cm, formerly 1.8 x 1.3 cm. -CA-125 has steadily improved.   2.  Social/family history: -Lives by herself.  Worked as a Land.  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   Quit smoking 2 years ago.   3.  Abdominal aneurysm: -CTAP on 06/24/2020 shows stable 3.4 x 3.3 cm infrarenal abdominal aortic aneurysm. -Used to follow with vascular surgery at [facility].   4.  Saddle pulmonary embolism: -CT angiogram of the chest on 10/27/2020 at [facility] showed saddle pulmonary embolus with extension into the right upper lobe, lower lobe and middle lobe pulmonary arteries as well as areas of segmental and subsegmental extension.  Left pulmonary artery competent appears to involve the left main pulmonary artery with areas of segmental and subsegmental extension into the left lower lobe.  Small left pleural effusion.  Findings concerning for right heart strain.  Small left pleural effusion. - Left leg Doppler on 10/27/2020 was negative for thrombosis. - Lovenox followed by Eliquis which was started on 11/02/2020.   PLAN:  1.  Stage IVa low-grade ovarian cancer: - She was started on gemcitabine and carboplatin on 01/18/2021 after her CT scan on 01/15/2021 showed mildly progressive omental/peritoneal metastasis. - After her day 8 of chemotherapy with gemcitabine single agent, she reportedly developed nausea and vomiting and was admitted to [facility] on 01/28/2021. - She was reportedly discharged around Sunday/Monday.  She has not thrown up  any since discharge from the hospital. - She also reported that she feels weak for 3-4 days after each treatment. - I have reviewed her labs today which showed normal LFTs and renal function.  CBC was normal.   - I will obtain recent hospitalization records from [facility]. - She reported that she had mild nausea after day 1 of treatment which was well controlled with Phenergan and Zofran.  As her nausea and vomiting was worse after day 8 of chemotherapy, I will add dexamethasone and Emend to day 8 of chemotherapy regimen.  She expressed readiness to start cycle 2 today and agrees with above plan. - She was told to reach out to her  primary doctor for adjustment of nebulizer treatments at home.   2.  Back pain/neuropathy: - Continue MS Contin 30 mg every 8 hours. - Continue Dilaudid 4 mg every 4 hours as needed. - Continue gabapentin 900 mg 3 times daily.   3.  Hypertension: - Continue lisinopril/HCTZ 20/12.5 daily.   4.  Nausea/vomiting: - Continue Phenergan 12.5 mg every 6 hours as needed.  Continue Zofran 8 mg every 8 hours as backup. - She is already on olanzapine 10 mg daily.   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  No bleeding issues reported. - CT CAP on 01/15/2021 did not show any central PE.   6.  Hypokalemia: - Continue potassium 20 mEq daily.  Potassium today 3.7.  7.  Hypomagnesemia: - Continue magnesium 3 times daily.  8.  Loss of appetite: - Continue Marinol 5 mg twice daily. - Continue Ensure 2 to 3 cans/day.   Orders placed this encounter:  No orders of the defined types were placed in this encounter.  Addendum: As she was receiving carboplatin, she developed shortness of breath.  She had bilateral wheezing.  However her saturations were 98%.  She was referred to the emergency room for possible carboplatin reaction and breathing treatments.  I have called and talked to ER attending and informed him of the situation.  Derek Jack, MD [place] [phone]   I, Thana Ates, am acting as a scribe for Dr. Derek Jack.  I, Derek Jack MD, have reviewed the above documentation for accuracy and completeness, and I agree with the above.

## 2021-02-08 ENCOUNTER — Emergency Department (HOSPITAL_COMMUNITY)
Admission: EM | Admit: 2021-02-08 | Discharge: 2021-02-08 | Disposition: A | Discharge status: Home / Self Care | Payer: Medicare Other | Attending: Emergency Medicine | Admitting: Emergency Medicine

## 2021-02-08 ENCOUNTER — Inpatient Hospital Stay (HOSPITAL_BASED_OUTPATIENT_CLINIC_OR_DEPARTMENT_OTHER): Payer: Medicare Other | Admitting: Hematology

## 2021-02-08 ENCOUNTER — Other Ambulatory Visit: Payer: Self-pay

## 2021-02-08 ENCOUNTER — Emergency Department (HOSPITAL_COMMUNITY): Payer: Medicare Other

## 2021-02-08 ENCOUNTER — Other Ambulatory Visit (HOSPITAL_COMMUNITY): Payer: Self-pay | Admitting: *Deleted

## 2021-02-08 ENCOUNTER — Encounter (HOSPITAL_COMMUNITY): Payer: Self-pay

## 2021-02-08 ENCOUNTER — Inpatient Hospital Stay (HOSPITAL_COMMUNITY): Payer: Medicare Other

## 2021-02-08 VITALS — BP 140/83 | HR 88 | Temp 97.3°F | Resp 18 | Wt 174.3 lb

## 2021-02-08 DIAGNOSIS — R059 Cough, unspecified: Secondary | ICD-10-CM | POA: Diagnosis not present

## 2021-02-08 DIAGNOSIS — R062 Wheezing: Secondary | ICD-10-CM | POA: Diagnosis not present

## 2021-02-08 DIAGNOSIS — Z7189 Other specified counseling: Secondary | ICD-10-CM

## 2021-02-08 DIAGNOSIS — Z1231 Encounter for screening mammogram for malignant neoplasm of breast: Secondary | ICD-10-CM

## 2021-02-08 DIAGNOSIS — C569 Malignant neoplasm of unspecified ovary: Secondary | ICD-10-CM

## 2021-02-08 DIAGNOSIS — C786 Secondary malignant neoplasm of retroperitoneum and peritoneum: Secondary | ICD-10-CM

## 2021-02-08 DIAGNOSIS — Z91040 Latex allergy status: Secondary | ICD-10-CM | POA: Diagnosis not present

## 2021-02-08 DIAGNOSIS — I509 Heart failure, unspecified: Secondary | ICD-10-CM | POA: Insufficient documentation

## 2021-02-08 DIAGNOSIS — I110 Hypertensive heart disease with heart failure: Secondary | ICD-10-CM | POA: Diagnosis not present

## 2021-02-08 DIAGNOSIS — Z8543 Personal history of malignant neoplasm of ovary: Secondary | ICD-10-CM | POA: Diagnosis not present

## 2021-02-08 DIAGNOSIS — T451X5A Adverse effect of antineoplastic and immunosuppressive drugs, initial encounter: Secondary | ICD-10-CM

## 2021-02-08 DIAGNOSIS — R0602 Shortness of breath: Secondary | ICD-10-CM | POA: Diagnosis not present

## 2021-02-08 DIAGNOSIS — Z7901 Long term (current) use of anticoagulants: Secondary | ICD-10-CM | POA: Diagnosis not present

## 2021-02-08 DIAGNOSIS — Z87891 Personal history of nicotine dependence: Secondary | ICD-10-CM | POA: Diagnosis not present

## 2021-02-08 DIAGNOSIS — Z79899 Other long term (current) drug therapy: Secondary | ICD-10-CM | POA: Insufficient documentation

## 2021-02-08 LAB — CBC WITH DIFFERENTIAL/PLATELET
Abs Immature Granulocytes: 0.17 10*3/uL — ABNORMAL HIGH (ref 0.00–0.07)
Basophils Absolute: 0.1 10*3/uL (ref 0.0–0.1)
Basophils Relative: 1 %
Eosinophils Absolute: 0.1 10*3/uL (ref 0.0–0.5)
Eosinophils Relative: 1 %
HCT: 32.8 % — ABNORMAL LOW (ref 36.0–46.0)
Hemoglobin: 10.4 g/dL — ABNORMAL LOW (ref 12.0–15.0)
Immature Granulocytes: 2 %
Lymphocytes Relative: 36 %
Lymphs Abs: 2.6 10*3/uL (ref 0.7–4.0)
MCH: 30.5 pg (ref 26.0–34.0)
MCHC: 31.7 g/dL (ref 30.0–36.0)
MCV: 96.2 fL (ref 80.0–100.0)
Monocytes Absolute: 0.9 10*3/uL (ref 0.1–1.0)
Monocytes Relative: 12 %
Neutro Abs: 3.6 10*3/uL (ref 1.7–7.7)
Neutrophils Relative %: 48 %
Platelets: 379 10*3/uL (ref 150–400)
RBC: 3.41 MIL/uL — ABNORMAL LOW (ref 3.87–5.11)
RDW: 16.8 % — ABNORMAL HIGH (ref 11.5–15.5)
WBC: 7.4 10*3/uL (ref 4.0–10.5)
nRBC: 0 % (ref 0.0–0.2)

## 2021-02-08 LAB — COMPREHENSIVE METABOLIC PANEL
ALT: 32 U/L (ref 0–44)
AST: 22 U/L (ref 15–41)
Albumin: 3.8 g/dL (ref 3.5–5.0)
Alkaline Phosphatase: 95 U/L (ref 38–126)
Anion gap: 9 (ref 5–15)
BUN: 16 mg/dL (ref 6–20)
CO2: 29 mmol/L (ref 22–32)
Calcium: 9.1 mg/dL (ref 8.9–10.3)
Chloride: 98 mmol/L (ref 98–111)
Creatinine, Ser: 0.76 mg/dL (ref 0.44–1.00)
GFR, Estimated: >60 mL/min (ref >60)
Glucose, Bld: 113 mg/dL — ABNORMAL HIGH (ref 70–99)
Potassium: 3.7 mmol/L (ref 3.5–5.1)
Sodium: 136 mmol/L (ref 135–145)
Total Bilirubin: 0.4 mg/dL (ref 0.3–1.2)
Total Protein: 7.2 g/dL (ref 6.5–8.1)

## 2021-02-08 MED ORDER — DEXAMETHASONE 4 MG PO TABS: 4.0000 mg | ORAL_TABLET | Freq: Every day | ORAL | 0 refills | Status: DC

## 2021-02-08 MED ORDER — ALBUTEROL SULFATE HFA 108 (90 BASE) MCG/ACT IN AERS: 2.0000 | INHALATION_SPRAY | Freq: Once | RESPIRATORY_TRACT | Status: AC

## 2021-02-08 MED ORDER — HEPARIN SOD (PORK) LOCK FLUSH 100 UNIT/ML IV SOLN: 500.0000 [IU] | Freq: Once | INTRAVENOUS | Status: DC | PRN

## 2021-02-08 MED ORDER — IPRATROPIUM-ALBUTEROL 20-100 MCG/ACT IN AERS
1.0000 | INHALATION_SPRAY | Freq: Four times a day (QID) | RESPIRATORY_TRACT | Status: DC
  Administered 2021-02-08: 1 via RESPIRATORY_TRACT

## 2021-02-08 MED ORDER — SODIUM CHLORIDE 0.9% FLUSH
10.0000 mL | INTRAVENOUS | Status: DC | PRN
  Administered 2021-02-08: 10 mL

## 2021-02-08 MED ORDER — LORAZEPAM 2 MG/ML IJ SOLN
0.5000 mg | Freq: Once | INTRAMUSCULAR | Status: AC
  Administered 2021-02-08: 0.5 mg via INTRAVENOUS
  Filled 2021-02-08: qty 1

## 2021-02-08 MED ORDER — DIPHENHYDRAMINE HCL 50 MG/ML IJ SOLN
50.0000 mg | Freq: Once | INTRAMUSCULAR | Status: AC
  Administered 2021-02-08: 50 mg via INTRAVENOUS
  Filled 2021-02-08: qty 1

## 2021-02-08 MED ORDER — HEPARIN SOD (PORK) LOCK FLUSH 100 UNIT/ML IV SOLN
500.0000 [IU] | Freq: Once | INTRAVENOUS | Status: AC
  Administered 2021-02-08: 500 [IU]
  Filled 2021-02-08: qty 5

## 2021-02-08 MED ORDER — IPRATROPIUM-ALBUTEROL 0.5-2.5 (3) MG/3ML IN SOLN
RESPIRATORY_TRACT | Status: AC
  Filled 2021-02-08: qty 3

## 2021-02-08 MED ORDER — PALONOSETRON HCL INJECTION 0.25 MG/5ML
0.2500 mg | Freq: Once | INTRAVENOUS | Status: AC
  Administered 2021-02-08: 0.25 mg via INTRAVENOUS
  Filled 2021-02-08: qty 5

## 2021-02-08 MED ORDER — ALBUTEROL SULFATE (2.5 MG/3ML) 0.083% IN NEBU: 2.5000 mg | INHALATION_SOLUTION | Freq: Four times a day (QID) | RESPIRATORY_TRACT | 12 refills | Status: AC | PRN

## 2021-02-08 MED ORDER — IPRATROPIUM-ALBUTEROL 0.5-2.5 (3) MG/3ML IN SOLN: 3.0000 mL | Freq: Once | RESPIRATORY_TRACT | Status: DC

## 2021-02-08 MED ORDER — FAMOTIDINE 20 MG IN NS 100 ML IVPB
20.0000 mg | Freq: Once | INTRAVENOUS | Status: AC
  Administered 2021-02-08: 20 mg via INTRAVENOUS
  Filled 2021-02-08: qty 20

## 2021-02-08 MED ORDER — SODIUM CHLORIDE 0.9 % IV SOLN
150.0000 mg | Freq: Once | INTRAVENOUS | Status: AC
  Administered 2021-02-08: 150 mg via INTRAVENOUS
  Filled 2021-02-08: qty 150

## 2021-02-08 MED ORDER — SODIUM CHLORIDE 0.9 % IV SOLN: Freq: Once | INTRAVENOUS | Status: AC

## 2021-02-08 MED ORDER — IOHEXOL 350 MG/ML SOLN
80.0000 mL | Freq: Once | INTRAVENOUS | Status: AC | PRN
  Administered 2021-02-08: 80 mL via INTRAVENOUS

## 2021-02-08 MED ORDER — SODIUM CHLORIDE 0.9 % IV SOLN
10.0000 mg | Freq: Once | INTRAVENOUS | Status: AC
  Administered 2021-02-08: 10 mg via INTRAVENOUS
  Filled 2021-02-08: qty 10

## 2021-02-08 MED ORDER — ALBUTEROL SULFATE HFA 108 (90 BASE) MCG/ACT IN AERS
INHALATION_SPRAY | RESPIRATORY_TRACT | Status: AC
  Administered 2021-02-08: 2 via RESPIRATORY_TRACT
  Filled 2021-02-08: qty 6.7

## 2021-02-08 MED ORDER — SODIUM CHLORIDE 0.9 % IV SOLN
560.0000 mg | Freq: Once | INTRAVENOUS | Status: AC
  Administered 2021-02-08: 560 mg via INTRAVENOUS
  Filled 2021-02-08: qty 56

## 2021-02-08 MED ORDER — SODIUM CHLORIDE 0.9 % IV SOLN
500.0000 mg/m2 | Freq: Once | INTRAVENOUS | Status: AC
  Administered 2021-02-08: 1026 mg via INTRAVENOUS
  Filled 2021-02-08: qty 26.98

## 2021-02-08 NOTE — ED Triage Notes (Signed)
Pt here from chemo dept, began having SOB and stating that it was hard to breath, 97% on RA with chemo RNs

## 2021-02-08 NOTE — Progress Notes (Signed)
Patient has been assessed, vital signs and labs have been reviewed by Dr. Katragadda. ANC, Creatinine, LFTs, and Platelets are within treatment parameters per Dr. Katragadda. The patient is good to proceed with treatment at this time. Primary RN and pharmacy aware.  

## 2021-02-08 NOTE — Patient Instructions (Addendum)
[facility] at [facility] Discharge Instructions  You were seen today by Dr. Katragadda. He went over your recent results, and you received your treatment. Dr. Katragadda will see you back in 3 weeks for labs and follow up.   Thank you for choosing [facility] at [facility] to provide your oncology and hematology care.  To afford each patient quality time with our provider, please arrive at least 15 minutes before your scheduled appointment time.   If you have a lab appointment with the Cancer Center please come in thru the Main Entrance and check in at the main information desk  You need to re-schedule your appointment should you arrive 10 or more minutes late.  We strive to give you quality time with our providers, and arriving late affects you and other patients whose appointments are after yours.  Also, if you no show three or more times for appointments you may be dismissed from the clinic at the providers discretion.     Again, thank you for choosing [facility].  Our hope is that these requests will decrease the amount of time that you wait before being seen by our physicians.       _____________________________________________________________  Should you have questions after your visit to  Cancer Center, please contact our office at [phone] between the hours of 8:00 a.m. and 4:30 p.m.  Voicemails left after 4:00 p.m. will not be returned until the following business day.  For prescription refill requests, have your pharmacy contact our office and allow 72 hours.    Cancer Center Support Programs:   > Cancer Support Group  2nd Tuesday of the month 1pm-2pm, Journey Room   

## 2021-02-08 NOTE — ED Notes (Signed)
Pt tearful and crying, states "The cancer doctor told me there is nothing he can do but experimental treatments and hospice", discussed pts personal religious beliefs, offered pt support and physical hug.  Pts shoes were left on 4th floor in room where she was receiving chemotherapy. Notified charge nurse who relayed message to [facility] to retrieve them for pt. Advised pt that she will be called when they are available for pick up as she does not want to wait for them. Pt given yellow footies for discharge.

## 2021-02-08 NOTE — Patient Instructions (Signed)
[facility]  Discharge Instructions: Thank you for choosing [facility] to provide your oncology and hematology care.  If you have a lab appointment with the Cancer Center, please come in thru the Main Entrance and check in at the main information desk.  Wear comfortable clothing and clothing appropriate for easy access to any Portacath or PICC line.   We strive to give you quality time with your provider. You may need to reschedule your appointment if you arrive late (15 or more minutes).  Arriving late affects you and other patients whose appointments are after yours.  Also, if you miss three or more appointments without notifying the office, you may be dismissed from the clinic at the provider's discretion.      For prescription refill requests, have your pharmacy contact our office and allow 72 hours for refills to be completed.    Today you received the following chemotherapy and/or immunotherapy agents Gemzar      To help prevent nausea and vomiting after your treatment, we encourage you to take your nausea medication as directed.  BELOW ARE SYMPTOMS THAT SHOULD BE REPORTED IMMEDIATELY: *FEVER GREATER THAN 100.4 F (38 C) OR HIGHER *CHILLS OR SWEATING *NAUSEA AND VOMITING THAT IS NOT CONTROLLED WITH YOUR NAUSEA MEDICATION *UNUSUAL SHORTNESS OF BREATH *UNUSUAL BRUISING OR BLEEDING *URINARY PROBLEMS (pain or burning when urinating, or frequent urination) *BOWEL PROBLEMS (unusual diarrhea, constipation, pain near the anus) TENDERNESS IN MOUTH AND THROAT WITH OR WITHOUT PRESENCE OF ULCERS (sore throat, sores in mouth, or a toothache) UNUSUAL RASH, SWELLING OR PAIN  UNUSUAL VAGINAL DISCHARGE OR ITCHING   Items with * indicate a potential emergency and should be followed up as soon as possible or go to the Emergency Department if any problems should occur.  Please show the CHEMOTHERAPY ALERT CARD or IMMUNOTHERAPY ALERT CARD at check-in to the Emergency  Department and triage nurse.  Should you have questions after your visit or need to cancel or reschedule your appointment, please contact [facility] [phone]  and follow the prompts.  Office hours are 8:00 a.m. to 4:30 p.m. Monday - Friday. Please note that voicemails left after 4:00 p.m. may not be returned until the following business day.  We are closed weekends and major holidays. You have access to a nurse at all times for urgent questions. Please call the main number to the clinic [phone] and follow the prompts.  For any non-urgent questions, you may also contact your provider using MyChart. We now offer e-Visits for anyone 18 and older to request care online for non-urgent symptoms. For details visit [email or web address].   Also download the MyChart app! Go to the app store, search "MyChart", open the app, select [facility], and log in with your MyChart username and password.  Due to Covid, a mask is required upon entering the hospital/clinic. If you do not have a mask, one will be given to you upon arrival. For doctor visits, patients may have 1 support person aged 18 or older with them. For treatment visits, patients cannot have anyone with them due to current Covid guidelines and our immunocompromised population.  

## 2021-02-08 NOTE — Progress Notes (Signed)
Patient arrived today for Gemzar/Carbo. Labs and patient assessed by Dr. Delton Coombes. Okay for treatment per Dr. Delton Coombes. Patient tolerated Gemzar with no complaints voiced. Side effects with management reviewed understanding verbalized. No bruising or swelling noted at port site.  RN started Carboplatin at 1407. Patient sitting up in the chair on the phone.   Horris Latino RN entered room to find patient coughing and coughing up mucus. Patient on 3L of oxygen, stating 98%, heart rate 110. Carbo stopped at 1430 and Dr. Delton Coombes reported to bedside, advised to send patient to ED. Dr. Delton Coombes called report to ED.  Patient assisted to ED by 2 RN via stretcher.

## 2021-02-08 NOTE — Progress Notes (Signed)
Hypersensitivity Reaction note  Date of event: 02/08/21 Time of event: 1430 Generic name of drug involved: Carboplatin Name of provider notified of the hypersensitivity reaction: Dr. Delton Coombes  Was agent that likely caused hypersensitivity reaction added to Allergies List within EMR? Yes Chain of events including reaction signs/symptoms, treatment administered, and outcome (e.g., drug resumed; drug discontinued; sent to Emergency Department; etc.)  RN entered into patient's room to find patient coughing and coughing up mucus, patient stated she was having trouble breathing, and burning inside. Patient was on 3 L of O2, O2 stat 98%, heart rate 110. Carboplatin stopped at 1430, Dr. Delton Coombes assessed patient at bedside and recommended patient go to the emergency department. Dr. Delton Coombes called report to emergency department and patient was assisted to the ED via stretcher by 2 Rns.   Cephus Shelling, RN 02/08/2021 3:32 PM

## 2021-02-08 NOTE — ED Provider Notes (Signed)
[facility] EMERGENCY DEPARTMENT Provider Note  CSN: [id number] Arrival date & time: 02/08/21 1441    History Chief Complaint  Patient presents with   Shortness of Breath    Heidi Johnson is a 50 y.o. female with history of metastatic ovarian cancer has had laparotomy, omentectomy in 2017, Carboplatin/paclitaxel x 5 cycles in 2018, Avastin x 22 cycles stopped in 11/2019, radiation treatment to femur in late 2021, currently getting Carboplatin/Gemcitabine also recently had DVT/PE at [facility], currently on anticoagulation and home oxygen, reports she was at Chemo earlier today. She had expressed concerns about the ineffectiveness of the chemo to her Oncologist, but he convinced her to continue with treatment today. She was given pre-treatment with benadryl and steroids but during her treatment had onset of increased SOB, wheezing and facial flushing. She was recently diagnosed with bronchitis by PCP and has just completed a course of Zithromax and Prednisone at home, has an inhaler as well. She denies a fever, but has had a cough with white sputum. No vomiting or diarrhea.    Past Medical History:  Diagnosis Date   Cancer ([place])    ovarian cancer   CHF (congestive heart failure) (HCC)    Hypertension    Panic attacks    Stroke [facility])     Past Surgical History:  Procedure Laterality Date   ABDOMINAL HYSTERECTOMY     BACK SURGERY     TUBAL LIGATION      Family History  Problem Relation Age of Onset   Cancer Maternal Aunt        breast ca/GYN    Social History   Tobacco Use   Smoking status: Former    Types: Cigarettes    Quit date: 06/20/2018    Years since quitting: 2.6   Smokeless tobacco: Never  Vaping Use   Vaping Use: Never used  Substance Use Topics   Alcohol use: No   Drug use: Yes    Types: Marijuana     Home Medications Prior to Admission medications   Medication Sig Start Date End Date Taking? Authorizing Provider  albuterol (PROVENTIL) (2.5 MG/3ML)  0.083% nebulizer solution Take 3 mLs (2.5 mg total) by nebulization every 6 (six) hours as needed for wheezing or shortness of breath. 02/08/21  Yes Truddie Hidden, MD  albuterol (VENTOLIN HFA) 108 (90 Base) MCG/ACT inhaler Inhale 2 puffs into the lungs as needed. 2 puffs into lungs every 6 hours as needed for wheezing or SOB 12/09/17  Yes [provider]  apixaban (ELIQUIS) 5 MG TABS tablet Take 1 tablet (5 mg total) by mouth 2 (two) times daily. 11/30/20  Yes Derek Jack, MD  clonazePAM (KLONOPIN) 1 MG tablet Take 1 mg by mouth 3 (three) times daily as needed for anxiety.   Yes [provider]  docusate sodium (COLACE) 100 MG capsule Take 100 mg by mouth 2 (two) times daily.   Yes [provider]  dronabinol (MARINOL) 5 MG capsule Take 1 capsule (5 mg total) by mouth 2 (two) times daily before lunch and supper. 01/18/21  Yes Derek Jack, MD  ergocalciferol (VITAMIN D2) 1.25 MG (50000 UT) capsule Take 1 capsule (50,000 Units total) by mouth once a week. 01/18/21  Yes Derek Jack, MD  gabapentin (NEURONTIN) 300 MG capsule Take 3 capsules (900 mg total) by mouth 3 (three) times daily. 09/22/20  Yes Derek Jack, MD  HYDROmorphone (DILAUDID) 4 MG tablet Take 1 tablet (4 mg total) by mouth every 4 (four) hours as  needed for severe pain. 02/04/21  Yes Derek Jack, MD  lidocaine-prilocaine (EMLA) cream Apply 1 application topically as needed.   Yes [provider]  lisinopril-hydrochlorothiazide (ZESTORETIC) 20-12.5 MG tablet Take 1 tablet by mouth 2 (two) times daily. 08/28/20  Yes [provider]  loperamide (IMODIUM) 2 MG capsule Take 1 capsule (2 mg total) by mouth as needed for diarrhea or loose stools. 11/02/20  Yes Derek Jack, MD  magnesium oxide (MAG-OX) 400 (240 Mg) MG tablet Take 1 tablet (400 mg total) by mouth 3 (three) times daily. 12/03/20  Yes Derek Jack, MD  metoprolol succinate (TOPROL-XL) 25  MG 24 hr tablet Take 25 mg by mouth daily. 02/02/21  Yes [provider]  morphine (MS CONTIN) 30 MG 12 hr tablet Take 1 tablet (30 mg total) by mouth every 8 (eight) hours. 02/04/21 03/06/21 Yes Derek Jack, MD  naloxone [facility]) 2 MG/2ML injection Place 2 mg into the nose as needed.  12/26/17  Yes [provider]  OLANZapine (ZYPREXA) 10 MG tablet Take 10 mg by mouth at bedtime. 11/28/19  Yes [provider]  ondansetron (ZOFRAN) 8 MG tablet Take 1 tablet (8 mg total) by mouth every 8 (eight) hours as needed for nausea or vomiting. 02/04/21  Yes Derek Jack, MD  ondansetron (ZOFRAN-ODT) 8 MG disintegrating tablet Take 1 tablet (8 mg total) by mouth every 8 (eight) hours as needed for nausea or vomiting. 06/25/20  Yes Gorsuch, Ni, MD  pantoprazole (PROTONIX) 40 MG tablet Take 1 tablet (40 mg total) by mouth daily. 01/18/21  Yes Derek Jack, MD  potassium chloride (KLOR-CON M10) 10 MEQ tablet TAKE 2 TABLETS BY MOUTH DAILY Patient taking differently: Take 20 mEq by mouth 2 (two) times daily. 12/18/20  Yes Pennington, Rebekah M, PA-C  promethazine (PHENERGAN) 12.5 MG tablet Take 1 tablet (12.5 mg total) by mouth every 6 (six) hours as needed for nausea or vomiting. 11/02/20  Yes Derek Jack, MD  senna (SENOKOT) 8.6 MG tablet Take 2 tablets (17.2 mg total) by mouth 3 (three) times daily. 07/10/20  Yes Gorsuch, Ni, MD  azithromycin (ZITHROMAX) 500 MG tablet Take 500 mg by mouth daily. Patient not taking: No sig reported 02/02/21   [provider]  cyclobenzaprine (FLEXERIL) 5 MG tablet Take 1 tablet by mouth 3 (three) times daily as needed. Patient not taking: No sig reported 11/01/19   [provider]  dexamethasone (DECADRON) 4 MG tablet Take 1 tablet (4 mg total) by mouth daily. Patient not taking: No sig reported 02/08/21   Derek Jack, MD  furosemide (LASIX) 20 MG tablet TAKE 2 TABLETS (40 MG TOTAL) BY MOUTH DAILY AS NEEDED FOR  FLUID OR EDEMA. Patient not taking: No sig reported 01/10/21   Harriett Rush, PA-C  ibuprofen (ADVIL) 600 MG tablet Take 600 mg by mouth every 6 (six) hours as needed. Patient not taking: No sig reported    [provider]  polyethylene glycol (MIRALAX / GLYCOLAX) 17 g packet Take 17 g by mouth 2 (two) times daily. Patient not taking: No sig reported 07/10/20   [name], MD  rosuvastatin (CRESTOR) 40 MG tablet Take 40 mg by mouth daily. Patient not taking: Reported on 02/08/2021 12/14/19   [provider]     Allergies    Carboplatin, Latex, and Sulfamethoxazole-trimethoprim   Review of Systems   Review of Systems A comprehensive review of systems was completed and negative except as noted in HPI.    Physical Exam BP 122/84  Pulse 86   Temp 98.3 F (36.8 C) (Oral)   Resp 14   Ht '5\' 9"'$  (1.753 m)   Wt 78.9 kg   SpO2 99%   BMI 25.70 kg/m   Physical Exam Vitals and nursing note reviewed.  Constitutional:      Appearance: Normal appearance.  HENT:     Head: Normocephalic and atraumatic.     Nose: Nose normal.     Mouth/Throat:     Mouth: Mucous membranes are moist.  Eyes:     Extraocular Movements: Extraocular movements intact.     Conjunctiva/sclera: Conjunctivae normal.  Cardiovascular:     Rate and Rhythm: Normal rate.  Pulmonary:     Effort: Pulmonary effort is normal.     Breath sounds: Wheezing and rhonchi present.  Abdominal:     General: Abdomen is flat.     Palpations: Abdomen is soft.     Tenderness: There is no abdominal tenderness.  Musculoskeletal:        General: No swelling. Normal range of motion.     Cervical back: Neck supple.  Skin:    General: Skin is warm and dry.  Neurological:     General: No focal deficit present.     Mental Status: She is alert.  Psychiatric:        Mood and Affect: Mood normal.     ED Results / Procedures / Treatments   Labs (all labs ordered are listed, but only abnormal results are  displayed) Labs Reviewed - No data to display  EKG EKG Interpretation  Date/Time:  Monday February 08 2021 15:39:03 EDT Ventricular Rate:  94 PR Interval:  154 QRS Duration: 97 QT Interval:  352 QTC Calculation: 441 R Axis:   70 Text Interpretation: Sinus rhythm ST elev, probable normal early repol pattern No significant change since last tracing Confirmed by Calvert Cantor [phone]) on 02/08/2021 3:52:51 PM  Radiology CT Angio Chest PE W/Cm &/Or Wo Cm  Result Date: 02/08/2021 CLINICAL DATA:  Shortness of breath.  Ovarian cancer EXAM: CT ANGIOGRAPHY CHEST WITH CONTRAST TECHNIQUE: Multidetector CT imaging of the chest was performed using the standard protocol during bolus administration of intravenous contrast. Multiplanar CT image reconstructions and MIPs were obtained to evaluate the vascular anatomy. CONTRAST:  22m OMNIPAQUE IOHEXOL 350 MG/ML SOLN COMPARISON:  01/15/2021 FINDINGS: Cardiovascular: No filling defects in the pulmonary arteries to suggest pulmonary emboli. Heart is normal size. Aorta normal caliber. Scattered coronary artery and aortic calcifications. Mediastinum/Nodes: No mediastinal, hilar, or axillary adenopathy. Trachea and esophagus are unremarkable. Thyroid unremarkable. Lungs/Pleura: Moderate to large-sized loculated left pleural effusion is again noted and unchanged rounded atelectasis in the left lower lobe, unchanged. Areas of peripheral calcifications throughout the left lung again noted, stable. No focal opacity or effusion on the right. Upper Abdomen: Calcified peritoneal/omental nodules seen in the upper abdomen, unchanged. Musculoskeletal: Left breast nodule again noted, unchanged. No acute bony abnormality. Review of the MIP images confirms the above findings. IMPRESSION: No evidence of pulmonary embolus. Stable moderate loculated left pleural effusion. Stable peripheral calcifications throughout the left lung and rounded atelectasis in the left lower lobe. Calcified  peritoneal nodules in the upper abdomen, stable. Stable left breast nodule.  Recommend correlation with mammography. Coronary artery disease.  Aortic atherosclerosis. Electronically Signed   By: KRolm BaptiseM.D.   On: 02/08/2021 17:42    Procedures Procedures  Medications Ordered in the ED Medications  Ipratropium-Albuterol (COMBIVENT) respimat 1 puff (1 puff Inhalation Given by Other 02/08/21 1720)  albuterol (VENTOLIN HFA) 108 (90 Base) MCG/ACT inhaler 2 puff (2 puffs Inhalation Given 02/08/21 1716)  iohexol (OMNIPAQUE) 350 MG/ML injection 80 mL (80 mLs Intravenous Contrast Given 02/08/21 1652)     MDM Rules/Calculators/A&P MDM Patient with SOB, cough, wheezing in setting of recurrent/relapsed ovarian cancer, not responding well to chemo by her report. She has expressed a desire to either switch to a different regimen or if none is available, considering palliative care/hospice. She reports compliance with her anticoagulation, will check CTA to eval recurrence. Labs from earlier today at cancer center reviewed and unremarkable. Duoneb for wheezing.   ED Course  I have reviewed the triage vital signs and the nursing notes.  Pertinent labs & imaging results that were available during my care of the patient were reviewed by me and considered in my medical decision making (see chart for details).  Clinical Course as of 02/08/21 1852  Mon Feb 08, 2021  1845 CTA neg for PE, unchanged malignant effusions. She is back to baseline now. Requesting refill of her nebs. She is frustrated that she is not responding to chemo and is interested in discussing her goals of care further with Hospice. Dr. Delton Coombes, Onc, is also aware. Otherwise she feels better and would like to go home.  [CS]    Clinical Course User Index [CS] Truddie Hidden, MD    Final Clinical Impression(s) / ED Diagnoses Final diagnoses:  SOB (shortness of breath)  Adverse reaction to antineoplastic drug, initial encounter     Rx / DC Orders ED Discharge Orders          Ordered    albuterol (PROVENTIL) (2.5 MG/3ML) 0.083% nebulizer solution  Every 6 hours PRN        02/08/21 1848    Ambulatory referral to Hospice        02/08/21 1848             Truddie Hidden, MD 02/08/21 [phone]

## 2021-02-08 NOTE — ED Notes (Signed)
Pt ambulated to the bathroom with one assist.  

## 2021-02-09 LAB — CA 125: Cancer Antigen (CA) 125: 262 U/mL — ABNORMAL HIGH (ref 0.0–38.1)

## 2021-02-10 ENCOUNTER — Other Ambulatory Visit (HOSPITAL_COMMUNITY): Payer: Self-pay | Admitting: Physician Assistant

## 2021-02-11 ENCOUNTER — Encounter: Payer: Self-pay | Admitting: Hematology

## 2021-02-11 ENCOUNTER — Encounter: Payer: Self-pay | Admitting: Hematology and Oncology

## 2021-02-14 NOTE — Progress Notes (Signed)
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 Exploratory laparotomy, posterior exenteration, argon beam ablation of peritoneum, omentectomy and optimal tumor debulking on 05/05/2016. 2. Carboplatin and paclitaxel x 5 cycles from 06/24/2016. 3. Avastin x 22 cycles through 12/02/2019 discontinued due to TIA and hypertension. 4. Left femur radiation 30 Gy in 10 fractions from 05/07/2020 to 05/20/2020  NGS Results: not done  CURRENT THERAPY: Carboplatin, gemcitabine and Aloxi every 3 weeks starting 07/03/2020  BRIEF ONCOLOGIC HISTORY:  Oncology History Overview Note  Negative genetics at [facility]   Ovarian cancer ([place])  04/25/2016 Imaging   CT confirms 12cm ovarian masses with extensive peritoneal metastases and malignant pleural effusions   05/05/2016 Surgery   s/p ex-lap posterior exenteration, diaphragm stripping, cholecystectomy, omentectomy, appendectomy and optimal tumor debulking on 05/05/16   06/24/2016 - 10/24/2016 Chemotherapy   Started Taxol/Carboplatin. 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  Numerous left predominant calcified pleural metastases and associated small left pleural effusion are unchanged.  2.  Unchanged (3.0 cm) (series 3, image 96) left breast mass    02/07/2019 Imaging   Outside Ct abdomen and  pelvis 1. Overall stable small volume peritoneal tumor, as described above.  2. Unchanged infrarenal abdominal aortic aneurysm.    02/22/2019 Tumor Marker   Patient's tumor was tested for the following markers: CA-125 Results of the tumor marker test revealed 164   03/18/2019 Tumor Marker   Patient's tumor was tested for the following markers: CA-125 Results of the tumor marker test revealed 162   04/08/2019 Tumor Marker   Patient's tumor was tested for the following markers: CA-125 Results of the tumor marker test revealed 164   04/29/2019 Tumor Marker   Patient's tumor was tested for the following markers: CA-125 Results of the tumor marker test revealed 181   05/20/2019 Tumor Marker   Patient's tumor was tested for the following markers: CA-125 Results of the tumor marker test revealed 164   06/10/2019 Tumor Marker   Patient's tumor was tested for the following markers: CA-125 Results of the tumor marker test revealed 152   07/01/2019 Tumor Marker   Patient's tumor was tested for the following markers: CA-125 Results of the tumor marker test revealed 160   07/22/2019 Tumor Marker   Patient's tumor was tested for the following markers: CA-125 Results of the tumor marker test revealed 171   08/12/2019 Tumor Marker   Patient's tumor was tested for the following markers: CA-125 Results of the tumor marker test revealed 173   09/02/2019 Tumor Marker   Patient's tumor was tested for the following markers: CA-125 Results of the tumor marker test revealed 159   09/30/2019 Tumor Marker   Patient's tumor was tested for the following markers: CA-125 Results of the tumor marker test revealed 168   11/08/2019 Imaging   Outside CT  imaging 1. Study was performed at outside institution on 11/08/2019 and presented for review on 11/14/2019.  2. Stable calcified and noncalcified left-sided nodular and plaque-like pleural abnormalities with small chronic left pleural effusion compatible with  stable treated pleural metastasis. No new or enlarging lesions.  3. No enlarging thoracic lymph nodes.  4. Redemonstration of left breast mass. Correlation with mammography is suggested if not already performed   12/02/2019 Tumor Marker   Patient's tumor was tested for the following markers: CA-125 Results of the tumor marker test revealed 186   12/18/2019 Cancer Staging   Staging form: Ovary, Fallopian Tube, and Primary Peritoneal Carcinoma, AJCC 8th Edition - Clinical stage from 12/18/2019: FIGO Stage IV (rcT3c, cN0, cM1) - Signed by [name], MD on 04/24/2020   01/09/2020 Tumor Marker   Patient's tumor was tested for the following markers: CA-125 Results of the tumor marker test revealed 184   01/10/2020 Imaging   1. Treated left pleural and peritoneal metastatic disease. Areas of residual soft tissue prominence in the right anatomic pelvis are indeterminate. Comparison with prior exams would be helpful. 2. Hepatomegaly. Low-attenuation lesions in the liver are too small to characterize. Comparison with prior exams would be helpful. 3. Small left fibrothorax. 4. Infrarenal Aortic aneurysm NOS (ICD10-I71.9). Recommend followup by ultrasound in 2 years.  5. Aortic atherosclerosis (ICD10-I70.0). Coronary artery calcification.   03/09/2020 Tumor Marker   Patient's tumor was tested for the following markers: CA-125 Results of the tumor marker test revealed 214   04/01/2020 Tumor Marker   Patient's tumor was tested for the following markers: CA-125 Results of the tumor marker test revealed 245.   04/03/2020 Imaging   1. No acute findings in the abdomen or pelvis. Specifically, no findings to explain the patient's history of left upper quadrant pain with nausea and vomiting. 2. Stable appearance of calcified omental and mesenteric nodules consistent with treated metastatic disease. Soft tissue fullness seen in the right adnexal region on the prior exam is similar today. 3. Stable 1.6 x 1.2  cm low-density lesion lateral segment left liver. 4. 3.5 cm infrarenal abdominal aortic aneurysm. Recommend follow-up ultrasound every 2 years. This recommendation follows ACR consensus guidelines: White Paper of the ACR Incidental Findings Committee II on Vascular Findings. J Am Coll Radiol 2013CJ:3944253. 5. Aortic Atherosclerosis (ICD10-I70.0).   04/15/2020 Imaging   Focus of abnormal uptake is seen involving the greater trochanter of the proximal left femur which corresponds to sclerotic density seen on prior CT scan and is concerning for metastatic disease.   04/23/2020 Imaging   1. No hip fracture, dislocation or avascular necrosis. 2. A 18 mm bone lesion in the peripheral superior aspect of the left greater trochanter with enhancement on postcontrast imaging. The overall appearance is most concerning for metastatic disease in the setting of known malignancy. 3. 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 4. Degenerative disease with disc height loss at L4-5 and L5-S1 with bilateral facet arthropathy.   06/08/2020 Tumor Marker   Patient's tumor was tested for the following markers: CA-125. Results of the tumor marker test revealed 272.   06/24/2020 Imaging   1. Overall progression of calcified and noncalcified peritoneal surface and omental lesions. 2. Slight progression of disease at the left lung base and involving the pleura. 3. Stable sclerotic bone lesion involving the left greater trochanter. 4. Stable infrarenal abdominal aortic aneurysm.   07/03/2020 -  Chemotherapy    Patient is on Treatment Plan: OVARIAN RECURRENT 3RD LINE CARBOPLATIN D1 /  GEMCITABINE D1,8 (4/800) Q21D       07/03/2020 Tumor Marker   Patient's tumor was tested for the following markers: CA-125 Results of the tumor marker test revealed 285.   07/10/2020 Tumor Marker   Patient's tumor was tested for the following markers: CA-125 Results of the tumor marker test revealed 332   07/23/2020 Tumor Marker    Patient's tumor was tested for the following markers: CA-125 Results of the tumor marker test revealed 308     CANCER STAGING: Cancer Staging Ovarian cancer [facility]) Staging form: Ovary, Fallopian Tube, and Primary Peritoneal Carcinoma, AJCC 8th Edition - Clinical stage from 12/18/2019: FIGO Stage IV (rcT3c, cN0, cM1) - Signed by [name], MD on 04/24/2020   INTERVAL HISTORY:  Ms. Heidi Johnson, a 50 y.o. female, returns for routine follow-up and consideration for next cycle of chemotherapy. Pearlie was last seen on 02/08/21.  Due for day #8 cycle #9 of Carboplatin, gemcitabine and Aloxi  today.   Overall, she tells me she has been feeling pretty well. She reports continued fatigue and burning leg pain.  Overall, she does not feel ready for next cycle of chemo today.   REVIEW OF SYSTEMS:  Review of Systems  Constitutional:  Positive for appetite change (25%) and fatigue (25%).  Gastrointestinal:  Positive for constipation, nausea and vomiting.  Musculoskeletal:  Positive for arthralgias (7/10 feet) and myalgias (7/10 legs).  Neurological:  Positive for headaches and numbness (tingling in legs and feet).  Psychiatric/Behavioral:  Positive for sleep disturbance (falling and staying asleep).   All other systems reviewed and are negative.  PAST MEDICAL/SURGICAL HISTORY:  Past Medical History:  Diagnosis Date   Cancer ([place])    ovarian cancer   CHF (congestive heart failure) (HCC)    Hypertension    Panic attacks    Stroke [facility])    Past Surgical History:  Procedure Laterality Date   ABDOMINAL HYSTERECTOMY     BACK SURGERY     TUBAL LIGATION      SOCIAL HISTORY:  Social History   Socioeconomic History   Marital status: Divorced    Spouse name: Not on file   Number of children: 3   Years of education: Not on file   Highest education level: Not on file  Occupational History   Occupation: retired  Tobacco Use   Smoking status: Former    Types: Cigarettes    Quit  date: 06/20/2018    Years since quitting: 2.6   Smokeless tobacco: Never  Vaping Use   Vaping Use: Never used  Substance and Sexual Activity   Alcohol use: No   Drug use: Yes    Types: Marijuana   Sexual activity: Yes  Other Topics Concern   Not on file  Social History Narrative   Lives alone   Social Determinants of Health   Financial Resource Strain: High Risk   Difficulty of Paying Living Expenses: Hard  Food Insecurity: No Food Insecurity   Worried About Running Out of Food in the Last Year: Never true   Ran Out of Food in the Last Year: Never true  Transportation Needs: No Transportation Needs   Lack of Transportation (Medical): No   Lack of Transportation (Non-Medical): No  Physical Activity: Inactive   Days of Exercise per Week: 0 days   Minutes of Exercise per Session: 0 min  Stress: No Stress Concern Present   Feeling of Stress : Only a little  Social Connections: Moderately Isolated   Frequency of Communication  with Friends and Family: Twice a week   Frequency of Social Gatherings with Friends and Family: Twice a week   Attends Religious Services: 1 to 4 times per year   Active Member of [employer] or Organizations: No   Attends Music therapist: Never   Marital Status: Divorced  Human resources officer Violence: Not At Risk   Fear of Current or Ex-Partner: No   Emotionally Abused: No   Physically Abused: No   Sexually Abused: No    FAMILY HISTORY:  Family History  Problem Relation Age of Onset   Cancer Maternal Aunt        breast ca/GYN    CURRENT MEDICATIONS:  Current Outpatient Medications  Medication Sig Dispense Refill   albuterol (PROVENTIL) (2.5 MG/3ML) 0.083% nebulizer solution Take 3 mLs (2.5 mg total) by nebulization every 6 (six) hours as needed for wheezing or shortness of breath. 75 mL 12   albuterol (VENTOLIN HFA) 108 (90 Base) MCG/ACT inhaler Inhale 2 puffs into the lungs as needed. 2 puffs into lungs every 6 hours as needed for wheezing  or SOB     apixaban (ELIQUIS) 5 MG TABS tablet Take 1 tablet (5 mg total) by mouth 2 (two) times daily. 60 tablet 5   azithromycin (ZITHROMAX) 500 MG tablet Take 500 mg by mouth daily. (Patient not taking: No sig reported)     clonazePAM (KLONOPIN) 1 MG tablet Take 1 mg by mouth 3 (three) times daily as needed for anxiety.     cyclobenzaprine (FLEXERIL) 5 MG tablet Take 1 tablet by mouth 3 (three) times daily as needed. (Patient not taking: No sig reported)     dexamethasone (DECADRON) 4 MG tablet Take 1 tablet (4 mg total) by mouth daily. (Patient not taking: No sig reported) 30 tablet 0   docusate sodium (COLACE) 100 MG capsule Take 100 mg by mouth 2 (two) times daily.     dronabinol (MARINOL) 5 MG capsule Take 1 capsule (5 mg total) by mouth 2 (two) times daily before lunch and supper. 60 capsule 1   ergocalciferol (VITAMIN D2) 1.25 MG (50000 UT) capsule Take 1 capsule (50,000 Units total) by mouth once a week. 12 capsule 1   furosemide (LASIX) 20 MG tablet TAKE 2 TABLETS (40 MG TOTAL) BY MOUTH DAILY AS NEEDED FOR FLUID OR EDEMA. 60 tablet 1   gabapentin (NEURONTIN) 300 MG capsule Take 3 capsules (900 mg total) by mouth 3 (three) times daily. 270 capsule 4   HYDROmorphone (DILAUDID) 4 MG tablet Take 1 tablet (4 mg total) by mouth every 4 (four) hours as needed for severe pain. 180 tablet 0   ibuprofen (ADVIL) 600 MG tablet Take 600 mg by mouth every 6 (six) hours as needed. (Patient not taking: No sig reported)     lidocaine-prilocaine (EMLA) cream Apply 1 application topically as needed.     lisinopril-hydrochlorothiazide (ZESTORETIC) 20-12.5 MG tablet Take 1 tablet by mouth 2 (two) times daily.     loperamide (IMODIUM) 2 MG capsule Take 1 capsule (2 mg total) by mouth as needed for diarrhea or loose stools. 60 capsule 3   magnesium oxide (MAG-OX) 400 (240 Mg) MG tablet Take 1 tablet (400 mg total) by mouth 3 (three) times daily. 90 tablet 3   metoprolol succinate (TOPROL-XL) 25 MG 24 hr tablet  Take 25 mg by mouth daily.     morphine (MS CONTIN) 30 MG 12 hr tablet Take 1 tablet (30 mg total) by mouth every 8 (eight) hours. [place]  tablet 0   naloxone (NARCAN) 2 MG/2ML injection Place 2 mg into the nose as needed.      OLANZapine (ZYPREXA) 10 MG tablet Take 10 mg by mouth at bedtime.     ondansetron (ZOFRAN) 8 MG tablet Take 1 tablet (8 mg total) by mouth every 8 (eight) hours as needed for nausea or vomiting. 60 tablet 3   ondansetron (ZOFRAN-ODT) 8 MG disintegrating tablet Take 1 tablet (8 mg total) by mouth every 8 (eight) hours as needed for nausea or vomiting. 60 tablet 1   pantoprazole (PROTONIX) 40 MG tablet Take 1 tablet (40 mg total) by mouth daily. 30 tablet 6   polyethylene glycol (MIRALAX / GLYCOLAX) 17 g packet Take 17 g by mouth 2 (two) times daily. (Patient not taking: No sig reported) 100 each 11   potassium chloride (KLOR-CON M10) 10 MEQ tablet TAKE 2 TABLETS BY MOUTH DAILY (Patient taking differently: Take 20 mEq by mouth 2 (two) times daily.) 180 tablet 1   promethazine (PHENERGAN) 12.5 MG tablet Take 1 tablet (12.5 mg total) by mouth every 6 (six) hours as needed for nausea or vomiting. 60 tablet 3   rosuvastatin (CRESTOR) 40 MG tablet Take 40 mg by mouth daily. (Patient not taking: Reported on 02/08/2021)     senna (SENOKOT) 8.6 MG tablet Take 2 tablets (17.2 mg total) by mouth 3 (three) times daily. 90 tablet 1   No current facility-administered medications for this visit.   Facility-Administered Medications Ordered in Other Visits  Medication Dose Route Frequency Provider Last Rate Last Admin   heparin lock flush 100 unit/mL  500 Units Intracatheter Once [name], MD        ALLERGIES:  Allergies  Allergen Reactions   Carboplatin Shortness Of Breath and Cough    See note on 02/08/2021   Latex Rash and Hives   Sulfamethoxazole-Trimethoprim Other (See Comments)    Reaction:  Unknown  Other reaction(s): Unknown When 18, dr told her not to take    PHYSICAL EXAM:   Performance status (ECOG): 1 - Symptomatic but completely ambulatory  There were no vitals filed for this visit. Wt Readings from Last 3 Encounters:  02/08/21 174 lb (78.9 kg)  02/08/21 174 lb 5 oz (79.1 kg)  01/18/21 184 lb 4.8 oz (83.6 kg)   Physical Exam Vitals reviewed.  Constitutional:      Appearance: Normal appearance.     Comments: In wheelchair  Cardiovascular:     Rate and Rhythm: Normal rate and regular rhythm.     Pulses: Normal pulses.     Heart sounds: Normal heart sounds.  Pulmonary:     Effort: Pulmonary effort is normal.     Breath sounds: Normal breath sounds.  Neurological:     General: No focal deficit present.     Mental Status: She is alert and oriented to person, place, and time.  Psychiatric:        Mood and Affect: Mood normal.        Behavior: Behavior normal.    LABORATORY DATA:  I have reviewed the labs as listed.  CBC Latest Ref Rng & Units 02/08/2021 01/25/2021 01/18/2021  WBC 4.0 - 10.5 K/uL 7.4 5.7 7.1  Hemoglobin 12.0 - 15.0 g/dL 10.4(L) 11.7(L) 11.6(L)  Hematocrit 36.0 - 46.0 % 32.8(L) 34.6(L) 35.5(L)  Platelets 150 - 400 K/uL 379 136(L) 217   CMP Latest Ref Rng & Units 02/08/2021 01/25/2021 01/18/2021  Glucose 70 - 99 mg/dL 113(H) 98 110(H)  BUN 6 -  20 mg/dL 16 18 24(H)  Creatinine 0.44 - 1.00 mg/dL 0.76 0.68 0.91  Sodium 135 - 145 mmol/L 136 136 136  Potassium 3.5 - 5.1 mmol/L 3.7 3.9 3.7  Chloride 98 - 111 mmol/L 98 102 101  CO2 22 - 32 mmol/L '29 28 28  '$ Calcium 8.9 - 10.3 mg/dL 9.1 9.2 9.0  Total Protein 6.5 - 8.1 g/dL 7.2 7.3 7.0  Total Bilirubin 0.3 - 1.2 mg/dL 0.4 0.4 0.4  Alkaline Phos 38 - 126 U/L 95 114 95  AST 15 - 41 U/L '22 23 17  '$ ALT 0 - 44 U/L 32 27 14    DIAGNOSTIC IMAGING:  I have independently reviewed the scans and discussed with the patient. CT Angio Chest PE W/Cm &/Or Wo Cm  Result Date: 02/08/2021 CLINICAL DATA:  Shortness of breath.  Ovarian cancer EXAM: CT ANGIOGRAPHY CHEST WITH CONTRAST TECHNIQUE: Multidetector  CT imaging of the chest was performed using the standard protocol during bolus administration of intravenous contrast. Multiplanar CT image reconstructions and MIPs were obtained to evaluate the vascular anatomy. CONTRAST:  57m OMNIPAQUE IOHEXOL 350 MG/ML SOLN COMPARISON:  01/15/2021 FINDINGS: Cardiovascular: No filling defects in the pulmonary arteries to suggest pulmonary emboli. Heart is normal size. Aorta normal caliber. Scattered coronary artery and aortic calcifications. Mediastinum/Nodes: No mediastinal, hilar, or axillary adenopathy. Trachea and esophagus are unremarkable. Thyroid unremarkable. Lungs/Pleura: Moderate to large-sized loculated left pleural effusion is again noted and unchanged rounded atelectasis in the left lower lobe, unchanged. Areas of peripheral calcifications throughout the left lung again noted, stable. No focal opacity or effusion on the right. Upper Abdomen: Calcified peritoneal/omental nodules seen in the upper abdomen, unchanged. Musculoskeletal: Left breast nodule again noted, unchanged. No acute bony abnormality. Review of the MIP images confirms the above findings. IMPRESSION: No evidence of pulmonary embolus. Stable moderate loculated left pleural effusion. Stable peripheral calcifications throughout the left lung and rounded atelectasis in the left lower lobe. Calcified peritoneal nodules in the upper abdomen, stable. Stable left breast nodule.  Recommend correlation with mammography. Coronary artery disease.  Aortic atherosclerosis. Electronically Signed   By: KRolm BaptiseM.D.   On: 02/08/2021 17:42   CT CHEST ABDOMEN PELVIS W CONTRAST  Result Date: 01/17/2021 CLINICAL DATA:  Ovarian cancer. Evaluate treatment response. Started chemotherapy 3-4 months ago. EXAM: CT CHEST, ABDOMEN, AND PELVIS WITH CONTRAST TECHNIQUE: Multidetector CT imaging of the chest, abdomen and pelvis was performed following the standard protocol during bolus administration of intravenous contrast.  CONTRAST:  750mOMNIPAQUE IOHEXOL 300 MG/ML  SOLN COMPARISON:  10/24/2020 abdominopelvic CT. Chest abdomen and pelvic staging CTs of 09/02/2020. FINDINGS: CT CHEST FINDINGS Cardiovascular: Left Port-A-Cath tip at mid right atrium. Aortic atherosclerosis. Normal heart size, without pericardial effusion. Lad coronary artery calcification. No central pulmonary embolism, on this non-dedicated study. Mediastinum/Nodes: No supraclavicular adenopathy. No axillary adenopathy. No mediastinal or hilar adenopathy. Lungs/Pleura: No right-sided pleural effusion. Partially loculated left-sided pleural effusion is small and similar to 09/02/2020 and 10/24/2020. Again identified is irregularity along the pleural surface with areas of calcification, relatively similar. Mild centrilobular emphysema. Left base rounded atelectasis. Musculoskeletal: Similar lateral left breast nodularity at 2.1 cm. A nodule inferior and medial to this measures 8 mm on 34/2 and is new. Sternal manubrial 9 mm sclerotic lesion is similar. CT ABDOMEN PELVIS FINDINGS Hepatobiliary: Irregular hepatic capsule, suspicious for cirrhosis. Vague area of hypoattenuation within segment 2 on 54/2 measures 1.4 cm today and is similar to on the prior. The hyperattenuating tiny hepatic dome lesion  on the prior exam is less distinct today at 4 mm on 44/2. Cholecystectomy, without biliary ductal dilatation. Pancreas: Atrophic versus developmentally/surgically absent pancreatic tail. Spleen: Extensive capsular calcifications throughout the spleen are unchanged. No splenomegaly. Adrenals/Urinary Tract: Normal adrenal glands. Normal right kidney. Mild left-sided caliectasis is new. No definite cause identified. Normal urinary bladder. Stomach/Bowel: Normal stomach, without wall thickening. Colonic stool burden suggests constipation. Normal terminal ileum. Normal small bowel caliber. Vascular/Lymphatic: Advanced aortic and branch vessel atherosclerosis. Infrarenal abdominal  aortic dilatation at 3.5 cm on 72/2, 3.3 cm on the prior. No abdominopelvic adenopathy. Reproductive: Hysterectomy.  No adnexal mass. Other: No significant free fluid. Again identified are primarily calcified peritoneal and omental nodules. the index area of partially calcified left omental soft tissue thickening measures 1.3 cm on 63/2 and is similar to minimally increased compared to 1.0 cm on 10/24/2020 (when remeasured). Calcified implant on the falciform ligament measures 11 mm on 60/2 and is unchanged. Implant along the undersurface of the right inferior rectus muscle measures 4.3 x 2.1 cm on 113/2 versus 4.2 x 1.8 cm on 10/24/2020 (when remeasured). No free intraperitoneal air. Musculoskeletal: Left femoral neck sclerotic lesion is similar at 1.7 cm, favoring a bone island. Degenerative disc disease at L4-5 and L5-S1 with trace L4-5 anterolisthesis. IMPRESSION: CT CHEST IMPRESSION 1. Left-sided loculated malignant pleural effusion, similar to prior exams. 2. No new or progressive disease within the chest. 3. Aortic atherosclerosis (ICD10-I70.0) and emphysema (ICD10-J43.9). coronary artery atherosclerosis. 4. Left breast nodularity, including a more medial and inferior 8 mm nodule, nonspecific. CT ABDOMEN AND PELVIS IMPRESSION 1. Omental/peritoneal metastasis, similar to mildly progressive compared to 10/24/2020 as detailed above. No bowel obstruction or other acute complication. 2. 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 Segment 2 low-density liver lesion is nonspecific and similar to on prior exams. 3. Aortic Atherosclerosis (ICD10-I70.0). Infrarenal aortic dilatation at 3.5 cm. Recommend follow-up ultrasound every 2 years. This recommendation follows ACR consensus guidelines: White Paper of the ACR Incidental Findings Committee II on Vascular Findings. J Am Coll Radiol 2013; 10:789-794. 4. New mild left-sided caliectasis, without cause identified. 5.  Possible constipation. Electronically Signed   By: Abigail Miyamoto M.D.   On:  01/17/2021 13:56     ASSESSMENT:  1.  Stage IVa low-grade ovarian cancer: -05/05/2016-exploratory laparotomy, posterior exenteration, argon beam ablation of peritoneum, diaphragm stripping, cholecystectomy, omentectomy, appendectomy and optimal tumor debulking by Dr. Carrolyn Leigh at [facility]. -06/24/2016: Started Taxol/carboplatin.  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  Taxol discontinued cycle 5. -10/24/2016: Started maintenance letrozole. -March 2020 bevacizumab started and completed 22 cycles until 12/02/2019, discontinued due to TIA and uncontrolled hypertension. -Germline mutation testing reportedly done at [facility] was negative. -She is under the care of Dr. Alvy Bimler from July 2021 -CTAP on 04/03/2020 with stable appearance of calcified omental and mesenteric nodules.  Soft tissue fullness in the right adnexal region is stable. -MRI of the left hip on 04/23/2020 with 18 mm bone lesion in the peripheral superior aspect of the left greater trochanter -XRT to left femur lesion, 30 Gray from 05/06/2020 through 05/20/2020. -CTAP on 06/24/2020 with overall progression of calcified and noncalcified peritoneal surface and omental lesions with slight progression of disease at the left lung base involving the pleura.  Stable sclerotic bone lesion involving the left greater trochanter. -3 cycles of carboplatin and gemcitabine from 07/03/2020 through 08/17/2020. -CT CAP on 09/02/2020 showed right hilar lymph node slightly increased in size.  Calcified left lower paratracheal lymph node also slightly increased in size.  CT of  the chest was compared to prior CT scan from 04/03/2020.  CTAP showed hypodense lesion in the dome of the right hepatic lobe is mildly hyperdense measuring 1.1 x 0.8 cm, previously the same.  Hypodense lesion in the lateral segment of the left hepatic lobe measures 2 x 1.7 cm, formerly 1.8 x 1.3 cm. -CA-125 has steadily improved.   2.  Social/family history: -Lives by herself.  Worked as a Therapist, sports.  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  Quit smoking 2 years ago.   3.  Abdominal aneurysm: -CTAP on 06/24/2020 shows stable 3.4 x 3.3 cm infrarenal abdominal aortic aneurysm. -Used to follow with vascular surgery at [facility].   4.  Saddle pulmonary embolism: -CT angiogram of the chest on 10/27/2020 at [facility] showed saddle pulmonary embolus with extension into the right upper lobe, lower lobe and middle lobe pulmonary arteries as well as areas of segmental and subsegmental extension.  Left pulmonary artery competent appears to involve the left main pulmonary artery with areas of segmental and subsegmental extension into the left lower lobe.  Small left pleural effusion.  Findings concerning for right heart strain.  Small left pleural effusion. - Left leg Doppler on 10/27/2020 was negative for thrombosis. - Lovenox followed by Eliquis which was started on 11/02/2020.   PLAN:  1.  Stage IVa low-grade ovarian cancer: - She was started on gemcitabine and carboplatin on 01/18/2021 after her CT from 01/15/2021 showed mildly progressive omental/peritoneal metastasis. - She had a reaction during treatment on 02/08/2021. - This is most likely carboplatin reaction as she developed severe wheezing and difficulty breathing 10 minutes into carboplatin. - She was sent to ER for further evaluation.  I have reviewed CT angio chest from 02/08/2021 with no evidence of pulmonary embolism.  Stable mildly loculated left pleural effusion and stable peripheral calcifications throughout the left lung.  Calcified peritoneal nodules in the upper abdomen stable.  Stable left breast nodule. - We had a prolonged discussion about best supportive care versus continuing treatments. - She is poorly tolerating treatments.  We have agreed on watchful waiting at this time.  She told me that she is not ready for hospice yet.  However she is agreeable to palliative care. - She would like to continue to  follow-up with me on a monthly basis.  RTC 4 weeks with labs.  We will also perform periodic imaging until she enrolls in hospice.    2.  Back pain/neuropathy: - Continue MS Contin 30 mg every 8 hours. - Continue Dilaudid 4 mg every 4 hours as needed. - Continue gabapentin 900 mg 3 times daily.   3.  Hypertension: - Continue lisinopril/HCTZ daily.  Blood pressure today is 140/89.   4.  Nausea/vomiting: - Continue Phenergan 12.5 mg every 6 hours as needed.  Continue Zofran 8 mg every 8 hours as backup.  She is already on olanzapine 10 mg daily.   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  No bleeding issues. - CT angiogram on 02/08/2021 with no evidence of pulmonary embolism.   6.  Hypokalemia: - Continue potassium 20 mg daily.  Potassium today is 3.7.  7.  Hypomagnesemia: - Continue magnesium 3 times daily.  8.  Loss of appetite: - Continue Marinol 5 mg twice daily.  Continue Ensure 2 to 3 cans/day.   Orders placed this encounter:  No orders of the defined types were placed in this encounter.    Derek Jack, MD  [place] [phone]   I, Thana Ates, am acting as a scribe for Dr. Derek Jack.  I, Derek Jack MD, have reviewed the above documentation for accuracy and completeness, and I agree with the above.

## 2021-02-15 ENCOUNTER — Other Ambulatory Visit: Payer: Self-pay

## 2021-02-15 ENCOUNTER — Encounter (HOSPITAL_COMMUNITY): Payer: Self-pay | Admitting: Lab

## 2021-02-15 ENCOUNTER — Inpatient Hospital Stay (HOSPITAL_COMMUNITY): Payer: Medicare Other

## 2021-02-15 ENCOUNTER — Inpatient Hospital Stay (HOSPITAL_BASED_OUTPATIENT_CLINIC_OR_DEPARTMENT_OTHER): Payer: Medicare Other | Admitting: Hematology

## 2021-02-15 VITALS — BP 141/89 | HR 79 | Temp 97.1°F | Resp 18 | Wt 174.1 lb

## 2021-02-15 DIAGNOSIS — C569 Malignant neoplasm of unspecified ovary: Secondary | ICD-10-CM

## 2021-02-15 DIAGNOSIS — C786 Secondary malignant neoplasm of retroperitoneum and peritoneum: Secondary | ICD-10-CM

## 2021-02-15 DIAGNOSIS — Z5111 Encounter for antineoplastic chemotherapy: Secondary | ICD-10-CM | POA: Diagnosis not present

## 2021-02-15 LAB — CBC WITH DIFFERENTIAL/PLATELET
Abs Immature Granulocytes: 0.4 10*3/uL — ABNORMAL HIGH (ref 0.00–0.07)
Basophils Absolute: 0.1 10*3/uL (ref 0.0–0.1)
Basophils Relative: 1 %
Eosinophils Absolute: 0 10*3/uL (ref 0.0–0.5)
Eosinophils Relative: 0 %
HCT: 35.5 % — ABNORMAL LOW (ref 36.0–46.0)
Hemoglobin: 11.4 g/dL — ABNORMAL LOW (ref 12.0–15.0)
Immature Granulocytes: 5 %
Lymphocytes Relative: 34 %
Lymphs Abs: 3 10*3/uL (ref 0.7–4.0)
MCH: 30.8 pg (ref 26.0–34.0)
MCHC: 32.1 g/dL (ref 30.0–36.0)
MCV: 95.9 fL (ref 80.0–100.0)
Monocytes Absolute: 0.8 10*3/uL (ref 0.1–1.0)
Monocytes Relative: 9 %
Neutro Abs: 4.5 10*3/uL (ref 1.7–7.7)
Neutrophils Relative %: 51 %
Platelets: 362 10*3/uL (ref 150–400)
RBC: 3.7 MIL/uL — ABNORMAL LOW (ref 3.87–5.11)
RDW: 17.5 % — ABNORMAL HIGH (ref 11.5–15.5)
WBC: 8.7 10*3/uL (ref 4.0–10.5)
nRBC: 0.2 % (ref 0.0–0.2)

## 2021-02-15 LAB — COMPREHENSIVE METABOLIC PANEL
ALT: 41 U/L (ref 0–44)
AST: 24 U/L (ref 15–41)
Albumin: 4.1 g/dL (ref 3.5–5.0)
Alkaline Phosphatase: 109 U/L (ref 38–126)
Anion gap: 7 (ref 5–15)
BUN: 24 mg/dL — ABNORMAL HIGH (ref 6–20)
CO2: 29 mmol/L (ref 22–32)
Calcium: 9.1 mg/dL (ref 8.9–10.3)
Chloride: 99 mmol/L (ref 98–111)
Creatinine, Ser: 0.68 mg/dL (ref 0.44–1.00)
GFR, Estimated: >60 mL/min (ref >60)
Glucose, Bld: 119 mg/dL — ABNORMAL HIGH (ref 70–99)
Potassium: 3.7 mmol/L (ref 3.5–5.1)
Sodium: 135 mmol/L (ref 135–145)
Total Bilirubin: 0.2 mg/dL — ABNORMAL LOW (ref 0.3–1.2)
Total Protein: 7.4 g/dL (ref 6.5–8.1)

## 2021-02-15 MED ORDER — HEPARIN SOD (PORK) LOCK FLUSH 100 UNIT/ML IV SOLN
500.0000 [IU] | Freq: Once | INTRAVENOUS | Status: AC
  Administered 2021-02-15: 500 [IU] via INTRAVENOUS

## 2021-02-15 MED ORDER — SODIUM CHLORIDE 0.9% FLUSH
10.0000 mL | Freq: Once | INTRAVENOUS | Status: AC
  Administered 2021-02-15: 10 mL via INTRAVENOUS

## 2021-02-15 NOTE — Patient Instructions (Addendum)
[place] at [facility] Discharge Instructions  You were seen today by Dr. Delton Coombes. He went over your recent results. You will be referred to [facility] palliative care for a consultation. Dr. Delton Coombes will see you back in 1 month for labs and follow up.   Thank you for choosing [place] at [facility] to provide your oncology and hematology care.  To afford each patient quality time with our provider, please arrive at least 15 minutes before your scheduled appointment time.   If you have a lab appointment with the [facility] please come in thru the Main Entrance and check in at the main information desk  You need to re-schedule your appointment should you arrive 10 or more minutes late.  We strive to give you quality time with our providers, and arriving late affects you and other patients whose appointments are after yours.  Also, if you no show three or more times for appointments you may be dismissed from the clinic at the providers discretion.     Again, thank you for choosing [facility]  Our hope is that these requests will decrease the amount of time that you wait before being seen by our physicians.       _____________________________________________________________  Should you have questions after your visit to [facility], please contact our office at [phone] between the hours of 8:00 a.m. and 4:30 p.m.  Voicemails left after 4:00 p.m. will not be returned until the following business day.  For prescription refill requests, have your pharmacy contact our office and allow 72 hours.    Cancer Center Support Programs:   > Cancer Support Group  2nd Tuesday of the month 1pm-2pm, Journey Room

## 2021-02-15 NOTE — Progress Notes (Signed)
Patient presents today for evaluation by provider.  Vital signs WNL.  Labs pending.  Patient has no new complaints at this time.  No treatment today per Dr. Delton Coombes, patient to return in one month for labs.    Provider referring to Palliative.  No complaints at this time.  Discharge from clinic via wheelchair in stable condition.  Alert and oriented X 3.  Follow up with [facility] as scheduled.

## 2021-02-15 NOTE — Patient Instructions (Signed)
[facility]  Discharge Instructions: Thank you for choosing [facility] to provide your oncology and hematology care.  If you have a lab appointment with the Cancer Center, please come in thru the Main Entrance and check in at the main information desk.  Wear comfortable clothing and clothing appropriate for easy access to any Portacath or PICC line.   We strive to give you quality time with your provider. You may need to reschedule your appointment if you arrive late (15 or more minutes).  Arriving late affects you and other patients whose appointments are after yours.  Also, if you miss three or more appointments without notifying the office, you may be dismissed from the clinic at the provider's discretion.      For prescription refill requests, have your pharmacy contact our office and allow 72 hours for refills to be completed.        To help prevent nausea and vomiting after your treatment, we encourage you to take your nausea medication as directed.  BELOW ARE SYMPTOMS THAT SHOULD BE REPORTED IMMEDIATELY: *FEVER GREATER THAN 100.4 F (38 C) OR HIGHER *CHILLS OR SWEATING *NAUSEA AND VOMITING THAT IS NOT CONTROLLED WITH YOUR NAUSEA MEDICATION *UNUSUAL SHORTNESS OF BREATH *UNUSUAL BRUISING OR BLEEDING *URINARY PROBLEMS (pain or burning when urinating, or frequent urination) *BOWEL PROBLEMS (unusual diarrhea, constipation, pain near the anus) TENDERNESS IN MOUTH AND THROAT WITH OR WITHOUT PRESENCE OF ULCERS (sore throat, sores in mouth, or a toothache) UNUSUAL RASH, SWELLING OR PAIN  UNUSUAL VAGINAL DISCHARGE OR ITCHING   Items with * indicate a potential emergency and should be followed up as soon as possible or go to the Emergency Department if any problems should occur.  Please show the CHEMOTHERAPY ALERT CARD or IMMUNOTHERAPY ALERT CARD at check-in to the Emergency Department and triage nurse.  Should you have questions after your visit or need to cancel  or reschedule your appointment, please contact [facility] [phone]  and follow the prompts.  Office hours are 8:00 a.m. to 4:30 p.m. Monday - Friday. Please note that voicemails left after 4:00 p.m. may not be returned until the following business day.  We are closed weekends and major holidays. You have access to a nurse at all times for urgent questions. Please call the main number to the clinic [phone] and follow the prompts.  For any non-urgent questions, you may also contact your provider using MyChart. We now offer e-Visits for anyone 18 and older to request care online for non-urgent symptoms. For details visit [email or web address].   Also download the MyChart app! Go to the app store, search "MyChart", open the app, select [place], and log in with your MyChart username and password.  Due to Covid, a mask is required upon entering the hospital/clinic. If you do not have a mask, one will be given to you upon arrival. For doctor visits, patients may have 1 support person aged 18 or older with them. For treatment visits, patients cannot have anyone with them due to current Covid guidelines and our immunocompromised population.  

## 2021-02-15 NOTE — Progress Notes (Unsigned)
Referral sent to [facility].   Records faxed on 8/29

## 2021-02-18 ENCOUNTER — Encounter: Payer: Self-pay | Admitting: Hematology and Oncology

## 2021-02-18 ENCOUNTER — Encounter: Payer: Self-pay | Admitting: Hematology

## 2021-02-23 ENCOUNTER — Other Ambulatory Visit (HOSPITAL_COMMUNITY): Payer: Self-pay | Admitting: Hematology

## 2021-02-27 ENCOUNTER — Other Ambulatory Visit (HOSPITAL_COMMUNITY): Payer: Self-pay | Admitting: Hematology

## 2021-03-01 ENCOUNTER — Ambulatory Visit (HOSPITAL_COMMUNITY): Payer: Medicare Other

## 2021-03-01 ENCOUNTER — Encounter: Payer: Self-pay | Admitting: Hematology and Oncology

## 2021-03-01 ENCOUNTER — Ambulatory Visit (HOSPITAL_COMMUNITY): Payer: Medicare Other | Admitting: Hematology

## 2021-03-01 ENCOUNTER — Other Ambulatory Visit (HOSPITAL_COMMUNITY): Payer: Medicare Other

## 2021-03-01 ENCOUNTER — Other Ambulatory Visit (HOSPITAL_COMMUNITY): Payer: Self-pay | Admitting: Hematology

## 2021-03-01 ENCOUNTER — Encounter: Payer: Self-pay | Admitting: Hematology

## 2021-03-03 ENCOUNTER — Other Ambulatory Visit (HOSPITAL_COMMUNITY): Payer: Self-pay | Admitting: Hematology

## 2021-03-08 ENCOUNTER — Ambulatory Visit (HOSPITAL_COMMUNITY): Payer: Medicare Other

## 2021-03-08 ENCOUNTER — Other Ambulatory Visit (HOSPITAL_COMMUNITY): Payer: Self-pay

## 2021-03-08 ENCOUNTER — Other Ambulatory Visit (HOSPITAL_COMMUNITY): Payer: Medicare Other

## 2021-03-08 DIAGNOSIS — C786 Secondary malignant neoplasm of retroperitoneum and peritoneum: Secondary | ICD-10-CM

## 2021-03-08 DIAGNOSIS — C569 Malignant neoplasm of unspecified ovary: Secondary | ICD-10-CM

## 2021-03-08 MED ORDER — HYDROMORPHONE HCL 4 MG PO TABS: 4.0000 mg | ORAL_TABLET | ORAL | 0 refills | Status: DC | PRN

## 2021-03-08 MED ORDER — MORPHINE SULFATE ER 30 MG PO TBCR: 30.0000 mg | EXTENDED_RELEASE_TABLET | Freq: Three times a day (TID) | ORAL | 0 refills | Status: DC

## 2021-03-09 ENCOUNTER — Other Ambulatory Visit (HOSPITAL_COMMUNITY): Payer: Self-pay | Admitting: *Deleted

## 2021-03-09 DIAGNOSIS — C786 Secondary malignant neoplasm of retroperitoneum and peritoneum: Secondary | ICD-10-CM

## 2021-03-09 DIAGNOSIS — C569 Malignant neoplasm of unspecified ovary: Secondary | ICD-10-CM

## 2021-03-09 MED ORDER — MORPHINE SULFATE ER 15 MG PO TBCR: 15.0000 mg | EXTENDED_RELEASE_TABLET | Freq: Two times a day (BID) | ORAL | 0 refills | Status: DC

## 2021-03-09 MED ORDER — HYDROMORPHONE HCL 4 MG PO TABS: 4.0000 mg | ORAL_TABLET | ORAL | 0 refills | Status: DC | PRN

## 2021-03-09 MED ORDER — MORPHINE SULFATE ER 30 MG PO TBCR: 30.0000 mg | EXTENDED_RELEASE_TABLET | Freq: Two times a day (BID) | ORAL | 0 refills | Status: DC | PRN

## 2021-03-16 NOTE — Progress Notes (Signed)
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 Exploratory laparotomy, posterior exenteration, argon beam ablation of peritoneum, omentectomy and optimal tumor debulking on 05/05/2016. 2. Carboplatin and paclitaxel x 5 cycles from 06/24/2016. 3. Avastin x 22 cycles through 12/02/2019 discontinued due to TIA and hypertension. 4. Left femur radiation 30 Gy in 10 fractions from 05/07/2020 to 05/20/2020  NGS Results: not done  CURRENT THERAPY: Carboplatin, gemcitabine and Aloxi every 3 weeks starting 07/03/2020  BRIEF ONCOLOGIC HISTORY:  Oncology History Overview Note  Negative genetics at [facility]   Ovarian cancer ([place])  04/25/2016 Imaging   CT confirms 12cm ovarian masses with extensive peritoneal metastases and malignant pleural effusions   05/05/2016 Surgery   s/p ex-lap posterior exenteration, diaphragm stripping, cholecystectomy, omentectomy, appendectomy and optimal tumor debulking on 05/05/16   06/24/2016 - 10/24/2016 Chemotherapy   Started Taxol/Carboplatin. 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  Numerous left predominant calcified pleural metastases and associated small left pleural effusion are unchanged.  2.  Unchanged (3.0 cm) (series 3, image 96) left breast mass    02/07/2019 Imaging   Outside Ct abdomen and  pelvis 1. Overall stable small volume peritoneal tumor, as described above.  2. Unchanged infrarenal abdominal aortic aneurysm.    02/22/2019 Tumor Marker   Patient's tumor was tested for the following markers: CA-125 Results of the tumor marker test revealed 164   03/18/2019 Tumor Marker   Patient's tumor was tested for the following markers: CA-125 Results of the tumor marker test revealed 162   04/08/2019 Tumor Marker   Patient's tumor was tested for the following markers: CA-125 Results of the tumor marker test revealed 164   04/29/2019 Tumor Marker   Patient's tumor was tested for the following markers: CA-125 Results of the tumor marker test revealed 181   05/20/2019 Tumor Marker   Patient's tumor was tested for the following markers: CA-125 Results of the tumor marker test revealed 164   06/10/2019 Tumor Marker   Patient's tumor was tested for the following markers: CA-125 Results of the tumor marker test revealed 152   07/01/2019 Tumor Marker   Patient's tumor was tested for the following markers: CA-125 Results of the tumor marker test revealed 160   07/22/2019 Tumor Marker   Patient's tumor was tested for the following markers: CA-125 Results of the tumor marker test revealed 171   08/12/2019 Tumor Marker   Patient's tumor was tested for the following markers: CA-125 Results of the tumor marker test revealed 173   09/02/2019 Tumor Marker   Patient's tumor was tested for the following markers: CA-125 Results of the tumor marker test revealed 159   09/30/2019 Tumor Marker   Patient's tumor was tested for the following markers: CA-125 Results of the tumor marker test revealed 168   11/08/2019 Imaging   Outside CT imaging  1. Study was performed at outside institution on 11/08/2019 and presented for review on 11/14/2019.  2. Stable calcified and noncalcified left-sided nodular and plaque-like pleural abnormalities with small chronic left pleural effusion compatible with  stable treated pleural metastasis. No new or enlarging lesions.  3. No enlarging thoracic lymph nodes.  4. Redemonstration of left breast mass. Correlation with mammography is suggested if not already performed   12/02/2019 Tumor Marker   Patient's tumor was tested for the following markers: CA-125 Results of the tumor marker test revealed 186   12/18/2019 Cancer Staging   Staging form: Ovary, Fallopian Tube, and Primary Peritoneal Carcinoma, AJCC 8th Edition - Clinical stage from 12/18/2019: FIGO Stage IV (rcT3c, cN0, cM1) - Signed by Heidi Lark, MD on 04/24/2020   01/09/2020 Tumor Marker   Patient's tumor was tested for the following markers: CA-125 Results of the tumor marker test revealed 184   01/10/2020 Imaging   1. Treated left pleural and peritoneal metastatic disease. Areas of residual soft tissue prominence in the right anatomic pelvis are indeterminate. Comparison with prior exams would be helpful. 2. Hepatomegaly. Low-attenuation lesions in the liver are too small to characterize. Comparison with prior exams would be helpful. 3. Small left fibrothorax. 4. Infrarenal Aortic aneurysm NOS (ICD10-I71.9). Recommend followup by ultrasound in 2 years.  5. Aortic atherosclerosis (ICD10-I70.0). Coronary artery calcification.   03/09/2020 Tumor Marker   Patient's tumor was tested for the following markers: CA-125 Results of the tumor marker test revealed 214   04/01/2020 Tumor Marker   Patient's tumor was tested for the following markers: CA-125 Results of the tumor marker test revealed 245.   04/03/2020 Imaging   1. No acute findings in the abdomen or pelvis. Specifically, no findings to explain the patient's history of left upper quadrant pain with nausea and vomiting. 2. Stable appearance of calcified omental and mesenteric nodules consistent with treated metastatic disease. Soft tissue fullness seen in the right adnexal region on the prior exam is similar today. 3. Stable 1.6 x 1.2  cm low-density lesion lateral segment left liver. 4. 3.5 cm infrarenal abdominal aortic aneurysm. Recommend follow-up ultrasound every 2 years. This recommendation follows ACR consensus guidelines: White Paper of the ACR Incidental Findings Committee II on Vascular Findings. J Am Coll Radiol 2013; 02:409-735. 5. Aortic Atherosclerosis (ICD10-I70.0).   04/15/2020 Imaging   Focus of abnormal uptake is seen involving the greater trochanter of the proximal left femur which corresponds to sclerotic density seen on prior CT scan and is concerning for metastatic disease.   04/23/2020 Imaging   1. No hip fracture, dislocation or avascular necrosis. 2. A 18 mm bone lesion in the peripheral superior aspect of the left greater trochanter with enhancement on postcontrast imaging. The overall appearance is most concerning for metastatic disease in the setting of known malignancy. 3. 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 4. Degenerative disease with disc height loss at L4-5 and L5-S1 with bilateral facet arthropathy.   06/08/2020 Tumor Marker   Patient's tumor was tested for the following markers: CA-125. Results of the tumor marker test revealed 272.   06/24/2020 Imaging   1. Overall progression of calcified and noncalcified peritoneal surface and omental lesions. 2. Slight progression of disease at the left lung base and involving the pleura. 3. Stable sclerotic bone lesion involving the left greater trochanter. 4. Stable infrarenal abdominal aortic aneurysm.   07/03/2020 -  Chemotherapy    Patient is on Treatment Plan: OVARIAN RECURRENT 3RD LINE CARBOPLATIN D1 / GEMCITABINE  D1,8 (4/800) Q21D       07/03/2020 Tumor Marker   Patient's tumor was tested for the following markers: CA-125 Results of the tumor marker test revealed 285.   07/10/2020 Tumor Marker   Patient's tumor was tested for the following markers: CA-125 Results of the tumor marker test revealed 332   07/23/2020 Tumor Marker    Patient's tumor was tested for the following markers: CA-125 Results of the tumor marker test revealed 308     CANCER STAGING: Cancer Staging Ovarian cancer [facility]) Staging form: Ovary, Fallopian Tube, and Primary Peritoneal Carcinoma, AJCC 8th Edition - Clinical stage from 12/18/2019: FIGO Stage IV (rcT3c, cN0, cM1) - Signed by Heidi Lark, MD on 04/24/2020   INTERVAL HISTORY:  Ms. EMBRY Johnson, a 50 y.o. female, returns for routine follow-up and consideration for next cycle of chemotherapy. Heidi was last seen on 02/15/2021.  Due for day #8 cycle #9 of gemcitabine today.   Overall, she tells me she has been feeling pretty well. She reports a cough productive of yellow sputum. She reports occasional chills and denies fever and nausea. She is taking Eliquis and tolerating it well. Her appetite is fair. She denies any recent falls or current bleeding. She reports pain in the bones of her legs.   Overall, she feels ready for next cycle of chemo today.   REVIEW OF SYSTEMS:  Review of Systems  Constitutional:  Positive for chills and fatigue (depleted). Negative for appetite change (50%) and fever.  HENT:   Negative for nosebleeds.   Respiratory:  Positive for cough. Negative for hemoptysis.   Gastrointestinal:  Positive for constipation. Negative for blood in stool and nausea.  Genitourinary:  Negative for hematuria.   Musculoskeletal:  Positive for arthralgias (6/10 legs and bones).  Neurological:  Positive for numbness.  Psychiatric/Behavioral:  Positive for depression and sleep disturbance. The patient is nervous/anxious.   All other systems reviewed and are negative.  PAST MEDICAL/SURGICAL HISTORY:  Past Medical History:  Diagnosis Date   Cancer ([place])    ovarian cancer   CHF (congestive heart failure) (H[facility])    Hypertension    Panic attacks    Stroke [facility])    Past Surgical History:  Procedure Laterality Date   ABDOMINAL HYSTERECTOMY     BACK SURGERY     TUBAL LIGATION       SOCIAL HISTORY:  Social History   Socioeconomic History   Marital status: Divorced    Spouse name: Not on file   Number of children: 3   Years of education: Not on file   Highest education level: Not on file  Occupational History   Occupation: retired  Tobacco Use   Smoking status: Former    Types: Cigarettes    Quit date: 06/20/2018    Years since quitting: 2.7   Smokeless tobacco: Never  Vaping Use   Vaping Use: Never used  Substance and Sexual Activity   Alcohol use: No   Drug use: Yes    Types: Marijuana   Sexual activity: Yes  Other Topics Concern   Not on file  Social History Narrative   Lives alone   Social Determinants of Health   Financial Resource Strain: High Risk   Difficulty of Paying Living Expenses: Hard  Food Insecurity: No Food Insecurity   Worried About Running Out of Food in the Last Year: Never true   Ran Out of Food in the Last Year: Never true  Transportation Needs: No Transportation Needs  Lack of Transportation (Medical): No   Lack of Transportation (Non-Medical): No  Physical Activity: Inactive   Days of Exercise per Week: 0 days   Minutes of Exercise per Session: 0 min  Stress: No Stress Concern Present   Feeling of Stress : Only a little  Social Connections: Moderately Isolated   Frequency of Communication with Friends and Family: Twice a week   Frequency of Social Gatherings with Friends and Family: Twice a week   Attends Religious Services: 1 to 4 times per year   Active Member of [employer] or Organizations: No   Attends Music therapist: Never   Marital Status: Divorced  Human resources officer Violence: Not At Risk   Fear of Current or Ex-Partner: No   Emotionally Abused: No   Physically Abused: No   Sexually Abused: No    FAMILY HISTORY:  Family History  Problem Relation Age of Onset   Cancer Maternal Aunt        breast ca/GYN    CURRENT MEDICATIONS:  Current Outpatient Medications  Medication Sig Dispense  Refill   albuterol (PROVENTIL) (2.5 MG/3ML) 0.083% nebulizer solution Take 3 mLs (2.5 mg total) by nebulization every 6 (six) hours as needed for wheezing or shortness of breath. (Patient not taking: Reported on 02/15/2021) 75 mL 12   albuterol (VENTOLIN HFA) 108 (90 Base) MCG/ACT inhaler Inhale 2 puffs into the lungs as needed. 2 puffs into lungs every 6 hours as needed for wheezing or SOB (Patient not taking: Reported on 02/15/2021)     apixaban (ELIQUIS) 5 MG TABS tablet Take 1 tablet (5 mg total) by mouth 2 (two) times daily. 60 tablet 5   azithromycin (ZITHROMAX) 500 MG tablet Take 500 mg by mouth daily.     benzonatate (TESSALON) 200 MG capsule Take by mouth.     clonazePAM (KLONOPIN) 1 MG tablet Take 1 mg by mouth 3 (three) times daily as needed for anxiety. (Patient not taking: Reported on 02/15/2021)     cyclobenzaprine (FLEXERIL) 5 MG tablet Take 1 tablet by mouth 3 (three) times daily as needed.     dexamethasone (DECADRON) 4 MG tablet TAKE 1 TABLET BY MOUTH EVERY DAY 30 tablet 0   docusate sodium (COLACE) 100 MG capsule Take 100 mg by mouth 2 (two) times daily.     dronabinol (MARINOL) 5 MG capsule Take 1 capsule (5 mg total) by mouth 2 (two) times daily before lunch and supper. 60 capsule 1   ergocalciferol (VITAMIN D2) 1.25 MG (50000 UT) capsule Take 1 capsule (50,000 Units total) by mouth once a week. 12 capsule 1   furosemide (LASIX) 20 MG tablet TAKE 2 TABLETS (40 MG TOTAL) BY MOUTH DAILY AS NEEDED FOR FLUID OR EDEMA. 180 tablet 1   gabapentin (NEURONTIN) 300 MG capsule TAKE 3 CAPSULES BY MOUTH 3 TIMES DAILY. 270 capsule 4   HYDROmorphone (DILAUDID) 4 MG tablet Take 1 tablet (4 mg total) by mouth every 4 (four) hours as needed for severe pain. 180 tablet 0   ibuprofen (ADVIL) 600 MG tablet Take 600 mg by mouth every 6 (six) hours as needed. (Patient not taking: Reported on 02/15/2021)     lidocaine-prilocaine (EMLA) cream Apply 1 application topically as needed.      lisinopril-hydrochlorothiazide (ZESTORETIC) 20-12.5 MG tablet Take 1 tablet by mouth 2 (two) times daily.     loperamide (IMODIUM) 2 MG capsule Take 1 capsule (2 mg total) by mouth as needed for diarrhea or loose stools. (Patient not taking: Reported  on 02/15/2021) 60 capsule 3   magnesium oxide (MAG-OX) 400 (240 Mg) MG tablet TAKE 1 TABLET BY MOUTH 3 TIMES DAILY. 270 tablet 1   metoprolol succinate (TOPROL-XL) 25 MG 24 hr tablet Take 25 mg by mouth daily.     morphine (MS CONTIN) 15 MG 12 hr tablet Take 1 tablet (15 mg total) by mouth every 12 (twelve) hours. 60 tablet 0   morphine (MS CONTIN) 30 MG 12 hr tablet Take 1 tablet (30 mg total) by mouth every 12 (twelve) hours as needed for pain. 90 tablet 0   naloxone (NARCAN) 2 MG/2ML injection Place 2 mg into the nose as needed.      OLANZapine (ZYPREXA) 10 MG tablet Take 10 mg by mouth at bedtime.     ondansetron (ZOFRAN) 8 MG tablet Take 1 tablet (8 mg total) by mouth every 8 (eight) hours as needed for nausea or vomiting. (Patient not taking: Reported on 02/15/2021) 60 tablet 3   ondansetron (ZOFRAN-ODT) 8 MG disintegrating tablet Take 1 tablet (8 mg total) by mouth every 8 (eight) hours as needed for nausea or vomiting. (Patient not taking: Reported on 02/15/2021) 60 tablet 1   pantoprazole (PROTONIX) 40 MG tablet Take 1 tablet (40 mg total) by mouth daily. 30 tablet 6   polyethylene glycol (MIRALAX / GLYCOLAX) 17 g packet Take 17 g by mouth 2 (two) times daily. 100 each 11   potassium chloride (KLOR-CON M10) 10 MEQ tablet TAKE 2 TABLETS BY MOUTH DAILY (Patient taking differently: Take 20 mEq by mouth 2 (two) times daily.) 180 tablet 1   promethazine (PHENERGAN) 12.5 MG tablet Take 1 tablet (12.5 mg total) by mouth every 6 (six) hours as needed for nausea or vomiting. (Patient not taking: Reported on 02/15/2021) 60 tablet 3   rosuvastatin (CRESTOR) 40 MG tablet Take 40 mg by mouth daily.     senna (SENOKOT) 8.6 MG tablet Take 2 tablets (17.2 mg total)  by mouth 3 (three) times daily. 90 tablet 1   No current facility-administered medications for this visit.   Facility-Administered Medications Ordered in Other Visits  Medication Dose Route Frequency Provider Last Rate Last Admin   heparin lock flush 100 unit/mL  500 Units Intracatheter Once Heidi Lark, MD        ALLERGIES:  Allergies  Allergen Reactions   Carboplatin Shortness Of Breath and Cough    See note on 02/08/2021   Latex Rash and Hives   Sulfamethoxazole-Trimethoprim Other (See Comments)    Reaction:  Unknown  Other reaction(s): Unknown When 18, dr told her not to take    PHYSICAL EXAM:  Performance status (ECOG): 1 - Symptomatic but completely ambulatory  There were no vitals filed for this visit. Wt Readings from Last 3 Encounters:  02/15/21 174 lb 1.6 oz (79 kg)  02/08/21 174 lb (78.9 kg)  02/08/21 174 lb 5 oz (79.1 kg)   Physical Exam Vitals reviewed.  Constitutional:      Appearance: Normal appearance.     Comments: In wheelchair  Cardiovascular:     Rate and Rhythm: Normal rate and regular rhythm.     Pulses: Normal pulses.     Heart sounds: Normal heart sounds.  Pulmonary:     Effort: Pulmonary effort is normal.     Breath sounds: Wheezing present.  Neurological:     General: No focal deficit present.     Mental Status: She is alert and oriented to person, place, and time.  Psychiatric:  Mood and Affect: Mood normal.        Behavior: Behavior normal.    LABORATORY DATA:  I have reviewed the labs as listed.  CBC Latest Ref Rng & Units 02/15/2021 02/08/2021 01/25/2021  WBC 4.0 - 10.5 K/uL 8.7 7.4 5.7  Hemoglobin 12.0 - 15.0 g/dL 11.4(L) 10.4(L) 11.7(L)  Hematocrit 36.0 - 46.0 % 35.5(L) 32.8(L) 34.6(L)  Platelets 150 - 400 K/uL 362 379 136(L)   CMP Latest Ref Rng & Units 02/15/2021 02/08/2021 01/25/2021  Glucose 70 - 99 mg/dL 119(H) 113(H) 98  BUN 6 - 20 mg/dL 24(H) 16 18  Creatinine 0.44 - 1.00 mg/dL 0.68 0.76 0.68  Sodium 135 - 145 mmol/L  135 136 136  Potassium 3.5 - 5.1 mmol/L 3.7 3.7 3.9  Chloride 98 - 111 mmol/L 99 98 102  CO2 22 - 32 mmol/L 29 29 28   Calcium 8.9 - 10.3 mg/dL 9.1 9.1 9.2  Total Protein 6.5 - 8.1 g/dL 7.4 7.2 7.3  Total Bilirubin 0.3 - 1.2 mg/dL 0.2(L) 0.4 0.4  Alkaline Phos 38 - 126 U/L 109 95 114  AST 15 - 41 U/L 24 22 23   ALT 0 - 44 U/L 41 32 27    DIAGNOSTIC IMAGING:  I have independently reviewed the scans and discussed with the patient. No results found.   ASSESSMENT:  1.  Stage IVa low-grade ovarian cancer: -05/05/2016-exploratory laparotomy, posterior exenteration, argon beam ablation of peritoneum, diaphragm stripping, cholecystectomy, omentectomy, appendectomy and optimal tumor debulking by Dr. Carrolyn Leigh at [facility]. -06/24/2016: Started Taxol/carboplatin.  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  Taxol discontinued cycle 5. -10/24/2016: Started maintenance letrozole. -March 2020 bevacizumab started and completed 22 cycles until 12/02/2019, discontinued due to TIA and uncontrolled hypertension. -Germline mutation testing reportedly done at [facility] was negative. -She is under the care of Dr. Alvy Bimler from July 2021 -CTAP on 04/03/2020 with stable appearance of calcified omental and mesenteric nodules.  Soft tissue fullness in the right adnexal region is stable. -MRI of the left hip on 04/23/2020 with 18 mm bone lesion in the peripheral superior aspect of the left greater trochanter -XRT to left femur lesion, 30 Gray from 05/06/2020 through 05/20/2020. -CTAP on 06/24/2020 with overall progression of calcified and noncalcified peritoneal surface and omental lesions with slight progression of disease at the left lung base involving the pleura.  Stable sclerotic bone lesion involving the left greater trochanter. -3 cycles of carboplatin and gemcitabine from 07/03/2020 through 08/17/2020. -CT CAP on 09/02/2020 showed right hilar lymph node slightly increased in size.  Calcified left lower paratracheal lymph node also  slightly increased in size.  CT of the chest was compared to prior CT scan from 04/03/2020.  CTAP showed hypodense lesion in the dome of the right hepatic lobe is mildly hyperdense measuring 1.1 x 0.8 cm, previously the same.  Hypodense lesion in the lateral segment of the left hepatic lobe measures 2 x 1.7 cm, formerly 1.8 x 1.3 cm. -CA-125 has steadily improved.   2.  Social/family history: -Lives by herself.  Worked as a Land.  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  Quit smoking 2 years ago.   3.  Abdominal aneurysm: -CTAP on 06/24/2020 shows stable 3.4 x 3.3 cm infrarenal abdominal aortic aneurysm. -Used to follow with vascular surgery at [facility].   4.  Saddle pulmonary embolism: -CT angiogram of the chest on 10/27/2020 at [facility] showed saddle pulmonary embolus with extension into the right upper lobe, lower lobe  and middle lobe pulmonary arteries as well as areas of segmental and subsegmental extension.  Left pulmonary artery competent appears to involve the left main pulmonary artery with areas of segmental and subsegmental extension into the left lower lobe.  Small left pleural effusion.  Findings concerning for right heart strain.  Small left pleural effusion. - Left leg Doppler on 10/27/2020 was negative for thrombosis. - Lovenox followed by Eliquis which was started on 11/02/2020.   PLAN:  1.  Stage IVa low-grade ovarian cancer: - At last visit we have stopped treatments due to poor tolerance. - We have discussed best supportive care in the form of hospice. - She agreed to enroll in palliative care but not in hospice care.  She would like to continue to have blood work and scans periodically. - Reviewed labs from today which showed normal LFTs and electrolytes.  CBC was grossly normal.  CA125 is pending. - She has a congestion with cough and expectoration, lower/greenish color.  Will call in Levaquin 500 mg daily for 7 to 10 days. - RTC 4 weeks with  repeat labs.  I will also repeat the CT CAP.     2.  Back pain/neuropathy: - She was evaluated by palliative care at home. - MS Contin was dose increased to 45 mg in the morning-30 mg in the afternoon-45 mg at night. - Continue Dilaudid 4 mg every 4 hours as needed. - Continue gabapentin 900 mg 3 times daily.   3.  Hypertension: - Continue lisinopril/HCTZ daily.  Blood pressure today is 120/74.   4.  Nausea/vomiting: - Continue Phenergan and Zofran as needed.  She is already on olanzapine 10 mg daily.   5.  Saddle pulmonary embolism: - CT angiogram on 02/08/2021 with no evidence of pulmonary embolism.  Continue Eliquis twice daily.  No bleeding issues.   6.  Hypokalemia: - Continue potassium 20 mEq daily.  Potassium today was normal.  7.  Hypomagnesemia: - Continue magnesium 3 times daily.  8.  Loss of appetite: - Continue Marinol 5 mg twice daily.  Continue Ensure 2 to 3 cans/day.   Orders placed this encounter:  No orders of the defined types were placed in this encounter.  Total time spent is 30 minutes with more than 50% of the time spent face-to-face discussing management plan, counseling and coordination of care.  Heidi Jack, MD [place] [phone]   I, Thana Ates, am acting as a scribe for Dr. Derek Johnson.  I, Heidi Jack MD, have reviewed the above documentation for accuracy and completeness, and I agree with the above.

## 2021-03-17 ENCOUNTER — Inpatient Hospital Stay (HOSPITAL_COMMUNITY): Payer: Medicare Other

## 2021-03-17 ENCOUNTER — Other Ambulatory Visit: Payer: Self-pay

## 2021-03-17 ENCOUNTER — Inpatient Hospital Stay (HOSPITAL_COMMUNITY): Payer: Medicare Other | Attending: Hematology | Admitting: Hematology

## 2021-03-17 VITALS — BP 120/74 | HR 74 | Temp 97.9°F | Resp 16 | Wt 186.1 lb

## 2021-03-17 DIAGNOSIS — C7951 Secondary malignant neoplasm of bone: Secondary | ICD-10-CM | POA: Insufficient documentation

## 2021-03-17 DIAGNOSIS — M47816 Spondylosis without myelopathy or radiculopathy, lumbar region: Secondary | ICD-10-CM | POA: Insufficient documentation

## 2021-03-17 DIAGNOSIS — Z1231 Encounter for screening mammogram for malignant neoplasm of breast: Secondary | ICD-10-CM

## 2021-03-17 DIAGNOSIS — I2510 Atherosclerotic heart disease of native coronary artery without angina pectoris: Secondary | ICD-10-CM | POA: Diagnosis not present

## 2021-03-17 DIAGNOSIS — Z8673 Personal history of transient ischemic attack (TIA), and cerebral infarction without residual deficits: Secondary | ICD-10-CM | POA: Diagnosis not present

## 2021-03-17 DIAGNOSIS — Z86711 Personal history of pulmonary embolism: Secondary | ICD-10-CM | POA: Diagnosis not present

## 2021-03-17 DIAGNOSIS — Z923 Personal history of irradiation: Secondary | ICD-10-CM | POA: Insufficient documentation

## 2021-03-17 DIAGNOSIS — C569 Malignant neoplasm of unspecified ovary: Secondary | ICD-10-CM

## 2021-03-17 DIAGNOSIS — Z87891 Personal history of nicotine dependence: Secondary | ICD-10-CM | POA: Diagnosis not present

## 2021-03-17 DIAGNOSIS — Z7952 Long term (current) use of systemic steroids: Secondary | ICD-10-CM | POA: Insufficient documentation

## 2021-03-17 DIAGNOSIS — Z08 Encounter for follow-up examination after completed treatment for malignant neoplasm: Secondary | ICD-10-CM | POA: Diagnosis not present

## 2021-03-17 DIAGNOSIS — C786 Secondary malignant neoplasm of retroperitoneum and peritoneum: Secondary | ICD-10-CM | POA: Diagnosis not present

## 2021-03-17 DIAGNOSIS — D279 Benign neoplasm of unspecified ovary: Secondary | ICD-10-CM | POA: Diagnosis not present

## 2021-03-17 DIAGNOSIS — Z9221 Personal history of antineoplastic chemotherapy: Secondary | ICD-10-CM | POA: Diagnosis not present

## 2021-03-17 DIAGNOSIS — I714 Abdominal aortic aneurysm, without rupture: Secondary | ICD-10-CM | POA: Diagnosis not present

## 2021-03-17 DIAGNOSIS — Z79899 Other long term (current) drug therapy: Secondary | ICD-10-CM | POA: Diagnosis not present

## 2021-03-17 DIAGNOSIS — R188 Other ascites: Secondary | ICD-10-CM

## 2021-03-17 DIAGNOSIS — R630 Anorexia: Secondary | ICD-10-CM | POA: Insufficient documentation

## 2021-03-17 DIAGNOSIS — N6320 Unspecified lump in the left breast, unspecified quadrant: Secondary | ICD-10-CM | POA: Diagnosis not present

## 2021-03-17 DIAGNOSIS — M549 Dorsalgia, unspecified: Secondary | ICD-10-CM | POA: Diagnosis not present

## 2021-03-17 DIAGNOSIS — C541 Malignant neoplasm of endometrium: Secondary | ICD-10-CM | POA: Insufficient documentation

## 2021-03-17 DIAGNOSIS — C7800 Secondary malignant neoplasm of unspecified lung: Secondary | ICD-10-CM | POA: Diagnosis not present

## 2021-03-17 DIAGNOSIS — Z79811 Long term (current) use of aromatase inhibitors: Secondary | ICD-10-CM | POA: Insufficient documentation

## 2021-03-17 DIAGNOSIS — E876 Hypokalemia: Secondary | ICD-10-CM | POA: Insufficient documentation

## 2021-03-17 DIAGNOSIS — J90 Pleural effusion, not elsewhere classified: Secondary | ICD-10-CM

## 2021-03-17 DIAGNOSIS — I110 Hypertensive heart disease with heart failure: Secondary | ICD-10-CM | POA: Diagnosis not present

## 2021-03-17 DIAGNOSIS — G629 Polyneuropathy, unspecified: Secondary | ICD-10-CM | POA: Insufficient documentation

## 2021-03-17 DIAGNOSIS — R112 Nausea with vomiting, unspecified: Secondary | ICD-10-CM | POA: Diagnosis not present

## 2021-03-17 DIAGNOSIS — I700 Atherosclerosis of aorta: Secondary | ICD-10-CM | POA: Diagnosis not present

## 2021-03-17 DIAGNOSIS — Z8589 Personal history of malignant neoplasm of other organs and systems: Secondary | ICD-10-CM

## 2021-03-17 DIAGNOSIS — I509 Heart failure, unspecified: Secondary | ICD-10-CM | POA: Insufficient documentation

## 2021-03-17 LAB — CBC WITH DIFFERENTIAL/PLATELET
Abs Immature Granulocytes: 0.16 10*3/uL — ABNORMAL HIGH (ref 0.00–0.07)
Basophils Absolute: 0.1 10*3/uL (ref 0.0–0.1)
Basophils Relative: 1 %
Eosinophils Absolute: 0.1 10*3/uL (ref 0.0–0.5)
Eosinophils Relative: 1 %
HCT: 46.7 % — ABNORMAL HIGH (ref 36.0–46.0)
Hemoglobin: 15.3 g/dL — ABNORMAL HIGH (ref 12.0–15.0)
Immature Granulocytes: 2 %
Lymphocytes Relative: 15 %
Lymphs Abs: 1.7 10*3/uL (ref 0.7–4.0)
MCH: 32.8 pg (ref 26.0–34.0)
MCHC: 32.8 g/dL (ref 30.0–36.0)
MCV: 100 fL (ref 80.0–100.0)
Monocytes Absolute: 0.8 10*3/uL (ref 0.1–1.0)
Monocytes Relative: 7 %
Neutro Abs: 8.3 10*3/uL — ABNORMAL HIGH (ref 1.7–7.7)
Neutrophils Relative %: 74 %
Platelets: 162 10*3/uL (ref 150–400)
RBC: 4.67 MIL/uL (ref 3.87–5.11)
RDW: 19.4 % — ABNORMAL HIGH (ref 11.5–15.5)
WBC: 11 10*3/uL — ABNORMAL HIGH (ref 4.0–10.5)
nRBC: 0 % (ref 0.0–0.2)

## 2021-03-17 LAB — COMPREHENSIVE METABOLIC PANEL
ALT: 24 U/L (ref 0–44)
AST: 23 U/L (ref 15–41)
Albumin: 4.1 g/dL (ref 3.5–5.0)
Alkaline Phosphatase: 76 U/L (ref 38–126)
Anion gap: 11 (ref 5–15)
BUN: 31 mg/dL — ABNORMAL HIGH (ref 6–20)
CO2: 26 mmol/L (ref 22–32)
Calcium: 9.5 mg/dL (ref 8.9–10.3)
Chloride: 97 mmol/L — ABNORMAL LOW (ref 98–111)
Creatinine, Ser: 0.69 mg/dL (ref 0.44–1.00)
GFR, Estimated: >60 mL/min (ref >60)
Glucose, Bld: 103 mg/dL — ABNORMAL HIGH (ref 70–99)
Potassium: 4.3 mmol/L (ref 3.5–5.1)
Sodium: 134 mmol/L — ABNORMAL LOW (ref 135–145)
Total Bilirubin: 0.7 mg/dL (ref 0.3–1.2)
Total Protein: 7.4 g/dL (ref 6.5–8.1)

## 2021-03-17 LAB — MAGNESIUM: Magnesium: 1.9 mg/dL (ref 1.7–2.4)

## 2021-03-17 MED ORDER — LEVOFLOXACIN 500 MG PO TABS: 500.0000 mg | ORAL_TABLET | Freq: Every day | ORAL | 0 refills | Status: AC

## 2021-03-17 NOTE — Patient Instructions (Signed)
[place] at [facility] Discharge Instructions  You were seen today by Dr. Delton Coombes. He went over your recent results. You will be scheduled for a CT scan of your chest, abdomen, and pelvis prior to your next visit. Dr. Delton Coombes will see you back in 1 month for labs and follow up.   Thank you for choosing [place] at [facility] to provide your oncology and hematology care.  To afford each patient quality time with our provider, please arrive at least 15 minutes before your scheduled appointment time.   If you have a lab appointment with the [place] please come in thru the Main Entrance and check in at the main information desk  You need to re-schedule your appointment should you arrive 10 or more minutes late.  We strive to give you quality time with our providers, and arriving late affects you and other patients whose appointments are after yours.  Also, if you no show three or more times for appointments you may be dismissed from the clinic at the providers discretion.     Again, thank you for choosing [facility].  Our hope is that these requests will decrease the amount of time that you wait before being seen by our physicians.       _____________________________________________________________  Should you have questions after your visit to [facility], please contact our office at [phone] between the hours of 8:00 a.m. and 4:30 p.m.  Voicemails left after 4:00 p.m. will not be returned until the following business day.  For prescription refill requests, have your pharmacy contact our office and allow 72 hours.    Cancer Center Support Programs:   > Cancer Support Group  2nd Tuesday of the month 1pm-2pm, Journey Room

## 2021-03-18 LAB — CA 125: Cancer Antigen (CA) 125: 341 U/mL — ABNORMAL HIGH (ref 0.0–38.1)

## 2021-03-25 ENCOUNTER — Other Ambulatory Visit (HOSPITAL_COMMUNITY): Payer: Self-pay | Admitting: Hematology

## 2021-03-30 ENCOUNTER — Other Ambulatory Visit (HOSPITAL_COMMUNITY): Payer: Self-pay | Admitting: Physician Assistant

## 2021-03-30 ENCOUNTER — Other Ambulatory Visit (HOSPITAL_COMMUNITY): Payer: Self-pay | Admitting: Hematology

## 2021-03-31 ENCOUNTER — Other Ambulatory Visit (HOSPITAL_COMMUNITY): Payer: Self-pay

## 2021-03-31 ENCOUNTER — Other Ambulatory Visit: Payer: Self-pay

## 2021-03-31 MED ORDER — DEXAMETHASONE 4 MG PO TABS
4.0000 mg | ORAL_TABLET | Freq: Every day | ORAL | 0 refills | Status: AC
  Filled 2021-03-31: qty 30, 30d supply, fill #0

## 2021-04-05 ENCOUNTER — Other Ambulatory Visit (HOSPITAL_COMMUNITY): Payer: Self-pay

## 2021-04-05 DIAGNOSIS — C786 Secondary malignant neoplasm of retroperitoneum and peritoneum: Secondary | ICD-10-CM

## 2021-04-05 DIAGNOSIS — C569 Malignant neoplasm of unspecified ovary: Secondary | ICD-10-CM

## 2021-04-05 MED ORDER — HYDROMORPHONE HCL 4 MG PO TABS
4.0000 mg | ORAL_TABLET | ORAL | 0 refills | Status: DC | PRN
Start: 2021-04-05 — End: 2021-04-06

## 2021-04-05 MED ORDER — MORPHINE SULFATE ER 30 MG PO TBCR: 30.0000 mg | EXTENDED_RELEASE_TABLET | Freq: Two times a day (BID) | ORAL | 0 refills | Status: DC | PRN

## 2021-04-06 ENCOUNTER — Other Ambulatory Visit (HOSPITAL_COMMUNITY): Payer: Self-pay

## 2021-04-06 DIAGNOSIS — C786 Secondary malignant neoplasm of retroperitoneum and peritoneum: Secondary | ICD-10-CM

## 2021-04-06 DIAGNOSIS — C569 Malignant neoplasm of unspecified ovary: Secondary | ICD-10-CM

## 2021-04-06 MED ORDER — HYDROMORPHONE HCL 4 MG PO TABS
4.0000 mg | ORAL_TABLET | ORAL | 0 refills | Status: DC | PRN
Start: 2021-04-06 — End: 2021-05-07

## 2021-04-06 MED ORDER — MORPHINE SULFATE ER 30 MG PO TBCR: 30.0000 mg | EXTENDED_RELEASE_TABLET | Freq: Two times a day (BID) | ORAL | 0 refills | Status: AC | PRN

## 2021-04-14 ENCOUNTER — Encounter (HOSPITAL_COMMUNITY): Payer: Self-pay | Admitting: Radiology

## 2021-04-14 ENCOUNTER — Ambulatory Visit (HOSPITAL_COMMUNITY)
Admission: RE | Admit: 2021-04-14 | Discharge: 2021-04-14 | Disposition: A | Discharge status: Home / Self Care | Payer: Medicare Other | Source: Ambulatory Visit | Attending: Hematology | Admitting: Hematology

## 2021-04-14 ENCOUNTER — Inpatient Hospital Stay (HOSPITAL_COMMUNITY): Payer: Medicare Other | Attending: Hematology

## 2021-04-14 ENCOUNTER — Other Ambulatory Visit: Payer: Self-pay

## 2021-04-14 DIAGNOSIS — Z79899 Other long term (current) drug therapy: Secondary | ICD-10-CM | POA: Diagnosis not present

## 2021-04-14 DIAGNOSIS — C786 Secondary malignant neoplasm of retroperitoneum and peritoneum: Secondary | ICD-10-CM | POA: Insufficient documentation

## 2021-04-14 DIAGNOSIS — R188 Other ascites: Secondary | ICD-10-CM | POA: Insufficient documentation

## 2021-04-14 DIAGNOSIS — Z08 Encounter for follow-up examination after completed treatment for malignant neoplasm: Secondary | ICD-10-CM

## 2021-04-14 DIAGNOSIS — Z8589 Personal history of malignant neoplasm of other organs and systems: Secondary | ICD-10-CM | POA: Insufficient documentation

## 2021-04-14 DIAGNOSIS — D279 Benign neoplasm of unspecified ovary: Secondary | ICD-10-CM | POA: Insufficient documentation

## 2021-04-14 DIAGNOSIS — C569 Malignant neoplasm of unspecified ovary: Secondary | ICD-10-CM | POA: Insufficient documentation

## 2021-04-14 DIAGNOSIS — J90 Pleural effusion, not elsewhere classified: Secondary | ICD-10-CM

## 2021-04-14 LAB — COMPREHENSIVE METABOLIC PANEL
ALT: 30 U/L (ref 0–44)
AST: 23 U/L (ref 15–41)
Albumin: 3.6 g/dL (ref 3.5–5.0)
Alkaline Phosphatase: 71 U/L (ref 38–126)
Anion gap: 10 (ref 5–15)
BUN: 19 mg/dL (ref 6–20)
CO2: 27 mmol/L (ref 22–32)
Calcium: 8.9 mg/dL (ref 8.9–10.3)
Chloride: 98 mmol/L (ref 98–111)
Creatinine, Ser: 0.67 mg/dL (ref 0.44–1.00)
GFR, Estimated: >60 mL/min (ref >60)
Glucose, Bld: 137 mg/dL — ABNORMAL HIGH (ref 70–99)
Potassium: 4 mmol/L (ref 3.5–5.1)
Sodium: 135 mmol/L (ref 135–145)
Total Bilirubin: 0.4 mg/dL (ref 0.3–1.2)
Total Protein: 6.6 g/dL (ref 6.5–8.1)

## 2021-04-14 LAB — CBC WITH DIFFERENTIAL/PLATELET
Abs Immature Granulocytes: 0.57 10*3/uL — ABNORMAL HIGH (ref 0.00–0.07)
Basophils Absolute: 0.1 10*3/uL (ref 0.0–0.1)
Basophils Relative: 1 %
Eosinophils Absolute: 0 10*3/uL (ref 0.0–0.5)
Eosinophils Relative: 0 %
HCT: 41.7 % (ref 36.0–46.0)
Hemoglobin: 13.6 g/dL (ref 12.0–15.0)
Immature Granulocytes: 4 %
Lymphocytes Relative: 14 %
Lymphs Abs: 1.9 10*3/uL (ref 0.7–4.0)
MCH: 32.3 pg (ref 26.0–34.0)
MCHC: 32.6 g/dL (ref 30.0–36.0)
MCV: 99 fL (ref 80.0–100.0)
Monocytes Absolute: 0.8 10*3/uL (ref 0.1–1.0)
Monocytes Relative: 6 %
Neutro Abs: 10 10*3/uL — ABNORMAL HIGH (ref 1.7–7.7)
Neutrophils Relative %: 75 %
Platelets: 162 10*3/uL (ref 150–400)
RBC: 4.21 MIL/uL (ref 3.87–5.11)
RDW: 16.6 % — ABNORMAL HIGH (ref 11.5–15.5)
WBC: 13.4 10*3/uL — ABNORMAL HIGH (ref 4.0–10.5)
nRBC: 0 % (ref 0.0–0.2)

## 2021-04-14 MED ORDER — HEPARIN SOD (PORK) LOCK FLUSH 100 UNIT/ML IV SOLN
INTRAVENOUS | Status: AC
  Administered 2021-04-14: 500 [IU] via INTRAVENOUS
  Filled 2021-04-14: qty 5

## 2021-04-14 MED ORDER — HEPARIN SOD (PORK) LOCK FLUSH 100 UNIT/ML IV SOLN: 500.0000 [IU] | Freq: Once | INTRAVENOUS | Status: AC

## 2021-04-14 MED ORDER — SODIUM CHLORIDE FLUSH 0.9 % IV SOLN
INTRAVENOUS | Status: AC
  Administered 2021-04-14: 10 mL
  Filled 2021-04-14: qty 10

## 2021-04-14 MED ORDER — IOHEXOL 300 MG/ML  SOLN
100.0000 mL | Freq: Once | INTRAMUSCULAR | Status: AC | PRN
  Administered 2021-04-14: 100 mL via INTRAVENOUS

## 2021-04-15 LAB — CA 125: Cancer Antigen (CA) 125: 411 U/mL — ABNORMAL HIGH (ref 0.0–38.1)

## 2021-04-20 NOTE — Progress Notes (Signed)
[address], [place] [address]   CLINIC:  Medical Oncology/Hematology  PCP:  Derek Jack, [place] / [place] [address] [phone]   REASON FOR VISIT:  Follow-up for ovarian cancer and peritoneal carcinomatosis  PRIOR THERAPY:  1. Exploratory laparotomy, posterior exenteration, argon beam ablation of peritoneum, omentectomy and optimal tumor debulking on 05/05/2016. 2. Carboplatin and paclitaxel x 5 cycles from 06/24/2016. 3. Avastin x 22 cycles through 12/02/2019 discontinued due to TIA and hypertension. 4. Left femur radiation 30 Gy in 10 fractions from 05/07/2020 to 05/20/2020  NGS Results: not done  CURRENT THERAPY: Carboplatin, gemcitabine and Aloxi every 3 weeks starting 07/03/2020  BRIEF ONCOLOGIC HISTORY:  Oncology History Overview Note  Negative genetics at [facility]   Ovarian cancer ([place])  04/25/2016 Imaging   CT confirms 12cm ovarian masses with extensive peritoneal metastases and malignant pleural effusions   05/05/2016 Surgery   s/p ex-lap posterior exenteration, diaphragm stripping, cholecystectomy, omentectomy, appendectomy and optimal tumor debulking on 05/05/16   06/24/2016 - 10/24/2016 Chemotherapy   Started Taxol/Carboplatin. 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  Numerous left predominant calcified pleural metastases and associated small left pleural effusion are unchanged.  2.  Unchanged (3.0 cm) (series 3, image 96) left breast mass    02/07/2019 Imaging   Outside Ct abdomen and  pelvis 1. Overall stable small volume peritoneal tumor, as described above.  2. Unchanged infrarenal abdominal aortic aneurysm.    02/22/2019 Tumor Marker   Patient's tumor was tested for the following markers: CA-125 Results of the tumor marker test revealed 164   03/18/2019 Tumor Marker   Patient's tumor was tested for the following markers: CA-125 Results of the tumor marker test revealed 162   04/08/2019 Tumor Marker   Patient's tumor was tested for the following markers: CA-125 Results of the tumor marker test revealed 164   04/29/2019 Tumor Marker   Patient's tumor was tested for the following markers: CA-125 Results of the tumor marker test revealed 181   05/20/2019 Tumor Marker   Patient's tumor was tested for the following markers: CA-125 Results of the tumor marker test revealed 164   06/10/2019 Tumor Marker   Patient's tumor was tested for the following markers: CA-125 Results of the tumor marker test revealed 152   07/01/2019 Tumor Marker   Patient's tumor was tested for the following markers: CA-125 Results of the tumor marker test revealed 160   07/22/2019 Tumor Marker   Patient's tumor was tested for the following markers: CA-125 Results of the tumor marker test revealed 171   08/12/2019 Tumor Marker   Patient's tumor was tested for the following markers: CA-125 Results of the tumor marker test revealed 173   09/02/2019 Tumor Marker   Patient's tumor was tested for the following markers: CA-125 Results of the tumor marker test revealed 159   09/30/2019 Tumor Marker   Patient's tumor was tested for the following markers: CA-125 Results of the tumor marker test revealed 168   11/08/2019 Imaging   Outside CT imaging  1. Study was performed at outside institution on 11/08/2019 and presented for review on 11/14/2019.  2. Stable calcified and noncalcified left-sided nodular and plaque-like pleural abnormalities with small chronic left pleural effusion compatible with  stable treated pleural metastasis. No new or enlarging lesions.  3. No enlarging thoracic lymph nodes.  4. Redemonstration of left breast mass. Correlation with mammography is suggested if not already performed   12/02/2019 Tumor Marker   Patient's tumor was tested for the following markers: CA-125 Results of the tumor marker test revealed 186   12/18/2019 Cancer Staging   Staging form: Ovary, Fallopian Tube, and Primary Peritoneal Carcinoma, AJCC 8th Edition - Clinical stage from 12/18/2019: FIGO Stage IV (rcT3c, cN0, cM1) - Signed by [name], MD on 04/24/2020    01/09/2020 Tumor Marker   Patient's tumor was tested for the following markers: CA-125 Results of the tumor marker test revealed 184   01/10/2020 Imaging   1. Treated left pleural and peritoneal metastatic disease. Areas of residual soft tissue prominence in the right anatomic pelvis are indeterminate. Comparison with prior exams would be helpful. 2. Hepatomegaly. Low-attenuation lesions in the liver are too small to characterize. Comparison with prior exams would be helpful. 3. Small left fibrothorax. 4. Infrarenal Aortic aneurysm NOS (ICD10-I71.9). Recommend followup by ultrasound in 2 years.  5. Aortic atherosclerosis (ICD10-I70.0). Coronary artery calcification.   03/09/2020 Tumor Marker   Patient's tumor was tested for the following markers: CA-125 Results of the tumor marker test revealed 214   04/01/2020 Tumor Marker   Patient's tumor was tested for the following markers: CA-125 Results of the tumor marker test revealed 245.   04/03/2020 Imaging   1. No acute findings in the abdomen or pelvis. Specifically, no findings to explain the patient's history of left upper quadrant pain with nausea and vomiting. 2. Stable appearance of calcified omental and mesenteric nodules consistent with treated metastatic disease. Soft tissue fullness seen in the right adnexal region on the prior exam is similar today. 3. Stable 1.6 x  1.2 cm low-density lesion lateral segment left liver. 4. 3.5 cm infrarenal abdominal aortic aneurysm. Recommend follow-up ultrasound every 2 years. This recommendation follows ACR consensus guidelines: White Paper of the ACR Incidental Findings Committee II on Vascular Findings. J Am Coll Radiol 2013; 40:981-191. 5. Aortic Atherosclerosis (ICD10-I70.0).   04/15/2020 Imaging   Focus of abnormal uptake is seen involving the greater trochanter of the proximal left femur which corresponds to sclerotic density seen on prior CT scan and is concerning for metastatic disease.   04/23/2020 Imaging   1. No hip fracture, dislocation or avascular necrosis. 2. A 18 mm bone lesion in the peripheral superior aspect of the left greater trochanter with enhancement on postcontrast imaging. The overall appearance is most concerning for metastatic disease in the setting of known malignancy. 3. 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 4. Degenerative disease with disc height loss at L4-5 and L5-S1 with bilateral facet arthropathy.   06/08/2020 Tumor Marker   Patient's tumor was tested for the following markers: CA-125. Results of the tumor marker test revealed 272.   06/24/2020 Imaging   1. Overall progression of calcified and noncalcified peritoneal surface and omental lesions. 2. Slight progression of disease at the left lung base and involving the pleura. 3. Stable sclerotic bone lesion involving the left greater trochanter. 4. Stable infrarenal abdominal aortic aneurysm.   07/03/2020 -  Chemotherapy    Patient is on Treatment Plan: OVARIAN RECURRENT 3RD LINE CARBOPLATIN D1 /  GEMCITABINE D1,8 (4/800) Q21D       07/03/2020 Tumor Marker   Patient's tumor was tested for the following markers: CA-125 Results of the tumor marker test revealed 285.   07/10/2020 Tumor Marker   Patient's tumor was tested for the following markers: CA-125 Results of the tumor marker test revealed 332   07/23/2020 Tumor Marker    Patient's tumor was tested for the following markers: CA-125 Results of the tumor marker test revealed 308     CANCER STAGING: Cancer Staging Ovarian cancer [facility]) Staging form: Ovary, Fallopian Tube, and Primary Peritoneal Carcinoma, AJCC 8th Edition - Clinical stage from 12/18/2019: FIGO Stage IV (rcT3c, cN0, cM1) - Signed by [name], MD on 04/24/2020   INTERVAL HISTORY:  Heidi Johnson, a 50 y.o. female, returns for routine follow-up of her ovarian cancer and peritoneal carcinomatosis. Rose-Marie was last seen on 03/17/2021.   Today she reports feeling fair. She reports continued fatigue but denies recent falls. She reports spasms and cramping in her hands, arms, and legs. She also reports cramping in her stomach. She is taking Marinol BID. She reports constipation for which she is taking stool softener.   REVIEW OF SYSTEMS:  Review of Systems  Constitutional:  Positive for appetite change (25%) and fatigue (depleted).  Cardiovascular:  Positive for palpitations.  Gastrointestinal:  Positive for abdominal pain (7/10 stomach cramps), constipation and nausea.  Musculoskeletal:  Positive for myalgias (7/10 hands and arms cramps).  Neurological:  Positive for dizziness, headaches and numbness.  Psychiatric/Behavioral:  Positive for sleep disturbance.   All other systems reviewed and are negative.  PAST MEDICAL/SURGICAL HISTORY:  Past Medical History:  Diagnosis Date   Cancer ([place])    ovarian cancer   CHF (congestive heart failure) (HCC)    Hypertension    Panic attacks    Stroke [facility])    Past Surgical History:  Procedure Laterality Date   ABDOMINAL HYSTERECTOMY     BACK SURGERY     TUBAL LIGATION      SOCIAL HISTORY:  Social History   Socioeconomic History   Marital status: Divorced    Spouse name: Not on file   Number of children: 3   Years of education: Not on file   Highest education level: Not on file  Occupational History   Occupation: retired  Tobacco  Use   Smoking status: Former    Types: Cigarettes    Quit date: 06/20/2018    Years since quitting: 2.8   Smokeless tobacco: Never  Vaping Use   Vaping Use: Never used  Substance and Sexual Activity   Alcohol use: No   Drug use: Yes    Types: Marijuana   Sexual activity: Yes  Other Topics Concern   Not on file  Social History Narrative   Lives alone   Social Determinants of Health   Financial Resource Strain: High Risk   Difficulty of Paying Living Expenses: Hard  Food Insecurity: No Food Insecurity   Worried About Running Out of Food in the Last Year: Never true   Ran Out of Food in the Last Year: Never true  Transportation Needs: No Transportation Needs   Lack of Transportation (Medical): No   Lack of Transportation (Non-Medical): No  Physical Activity: Inactive   Days of Exercise per Week: 0 days   Minutes of Exercise per Session: 0 min  Stress: No Stress Concern Present   Feeling of Stress : Only a little  Social Connections: Moderately Isolated   Frequency  of Communication with Friends and Family: Twice a week   Frequency of Social Gatherings with Friends and Family: Twice a week   Attends Religious Services: 1 to 4 times per year   Active Member of [employer] or Organizations: No   Attends Music therapist: Never   Marital Status: Divorced  Human resources officer Violence: Not At Risk   Fear of Current or Ex-Partner: No   Emotionally Abused: No   Physically Abused: No   Sexually Abused: No    FAMILY HISTORY:  Family History  Problem Relation Age of Onset   Cancer Maternal Aunt        breast ca/GYN    CURRENT MEDICATIONS:  Current Outpatient Medications  Medication Sig Dispense Refill   albuterol (PROVENTIL) (2.5 MG/3ML) 0.083% nebulizer solution Take 3 mLs (2.5 mg total) by nebulization every 6 (six) hours as needed for wheezing or shortness of breath. (Patient not taking: No sig reported) 75 mL 12   albuterol (VENTOLIN HFA) 108 (90 Base) MCG/ACT  inhaler Inhale 2 puffs into the lungs as needed. 2 puffs into lungs every 6 hours as needed for wheezing or SOB (Patient not taking: No sig reported)     apixaban (ELIQUIS) 5 MG TABS tablet Take 1 tablet (5 mg total) by mouth 2 (two) times daily. 60 tablet 5   azithromycin (ZITHROMAX) 500 MG tablet Take 500 mg by mouth daily.     benzonatate (TESSALON) 200 MG capsule Take by mouth.     clonazePAM (KLONOPIN) 1 MG tablet Take 1 mg by mouth 3 (three) times daily as needed for anxiety. (Patient not taking: No sig reported)     cyclobenzaprine (FLEXERIL) 5 MG tablet Take 1 tablet by mouth 3 (three) times daily as needed. (Patient not taking: Reported on 03/17/2021)     dexamethasone (DECADRON) 4 MG tablet Take 1 tablet (4 mg total) by mouth daily. 30 tablet 0   docusate sodium (COLACE) 100 MG capsule Take 100 mg by mouth 2 (two) times daily.     dronabinol (MARINOL) 5 MG capsule Take 1 capsule (5 mg total) by mouth 2 (two) times daily before lunch and supper. 60 capsule 1   ergocalciferol (VITAMIN D2) 1.25 MG (50000 UT) capsule Take 1 capsule (50,000 Units total) by mouth once a week. 12 capsule 1   furosemide (LASIX) 20 MG tablet TAKE 2 TABLETS (40 MG TOTAL) BY MOUTH DAILY AS NEEDED FOR FLUID OR EDEMA. (Patient not taking: Reported on 03/17/2021) 180 tablet 1   gabapentin (NEURONTIN) 300 MG capsule TAKE 3 CAPSULES BY MOUTH 3 TIMES DAILY. 270 capsule 4   HYDROmorphone (DILAUDID) 4 MG tablet Take 1 tablet (4 mg total) by mouth every 4 (four) hours as needed for severe pain. 180 tablet 0   ibuprofen (ADVIL) 600 MG tablet Take 600 mg by mouth every 6 (six) hours as needed. (Patient not taking: No sig reported)     KLOR-CON M10 10 MEQ tablet TAKE 2 TABLETS BY MOUTH DAILY 180 tablet 1   levofloxacin (LEVAQUIN) 500 MG tablet Take 1 tablet (500 mg total) by mouth daily. 10 tablet 0   lidocaine-prilocaine (EMLA) cream Apply 1 application topically as needed.     lisinopril-hydrochlorothiazide (ZESTORETIC) 20-12.5  MG tablet Take 1 tablet by mouth 2 (two) times daily.     loperamide (IMODIUM) 2 MG capsule Take 1 capsule (2 mg total) by mouth as needed for diarrhea or loose stools. (Patient not taking: No sig reported) 60 capsule 3   magnesium  oxide (MAG-OX) 400 (240 Mg) MG tablet TAKE 1 TABLET BY MOUTH 3 TIMES DAILY. 270 tablet 1   metoprolol succinate (TOPROL-XL) 25 MG 24 hr tablet Take 25 mg by mouth daily.     morphine (MS CONTIN) 15 MG 12 hr tablet Take 1 tablet (15 mg total) by mouth every 12 (twelve) hours. 60 tablet 0   morphine (MS CONTIN) 30 MG 12 hr tablet Take 1 tablet (30 mg total) by mouth every 12 (twelve) hours as needed for pain. 90 tablet 0   naloxone (NARCAN) 2 MG/2ML injection Place 2 mg into the nose as needed.      OLANZapine (ZYPREXA) 10 MG tablet Take 10 mg by mouth at bedtime.     ondansetron (ZOFRAN) 8 MG tablet Take 1 tablet (8 mg total) by mouth every 8 (eight) hours as needed for nausea or vomiting. (Patient not taking: No sig reported) 60 tablet 3   ondansetron (ZOFRAN-ODT) 8 MG disintegrating tablet Take 1 tablet (8 mg total) by mouth every 8 (eight) hours as needed for nausea or vomiting. (Patient not taking: No sig reported) 60 tablet 1   pantoprazole (PROTONIX) 40 MG tablet Take 1 tablet (40 mg total) by mouth daily. 30 tablet 6   polyethylene glycol (MIRALAX / GLYCOLAX) 17 g packet Take 17 g by mouth 2 (two) times daily. 100 each 11   promethazine (PHENERGAN) 12.5 MG tablet TAKE 1 TABLET BY MOUTH EVERY 6 HOURS AS NEEDED FOR NAUSEA OR VOMITING. 60 tablet 3   rosuvastatin (CRESTOR) 40 MG tablet Take 40 mg by mouth daily.     senna (SENOKOT) 8.6 MG tablet Take 2 tablets (17.2 mg total) by mouth 3 (three) times daily. 90 tablet 1   No current facility-administered medications for this visit.   Facility-Administered Medications Ordered in Other Visits  Medication Dose Route Frequency Provider Last Rate Last Admin   heparin lock flush 100 unit/mL  500 Units Intracatheter Once  [name], MD        ALLERGIES:  Allergies  Allergen Reactions   Carboplatin Shortness Of Breath and Cough    See note on 02/08/2021   Latex Rash and Hives   Sulfamethoxazole-Trimethoprim Other (See Comments)    Reaction:  Unknown  Other reaction(s): Unknown When 18, dr told her not to take    PHYSICAL EXAM:  Performance status (ECOG): 1 - Symptomatic but completely ambulatory  There were no vitals filed for this visit. Wt Readings from Last 3 Encounters:  03/17/21 186 lb 1.6 oz (84.4 kg)  02/15/21 174 lb 1.6 oz (79 kg)  02/08/21 174 lb (78.9 kg)   Physical Exam Vitals reviewed.  Constitutional:      Appearance: Normal appearance.     Comments: In wheelchair  Cardiovascular:     Rate and Rhythm: Normal rate and regular rhythm.     Pulses: Normal pulses.     Heart sounds: Normal heart sounds.  Pulmonary:     Effort: Pulmonary effort is normal.     Breath sounds: Normal breath sounds.  Abdominal:     Palpations: Abdomen is soft. There is no mass.     Tenderness: There is no abdominal tenderness.  Neurological:     General: No focal deficit present.     Mental Status: She is alert and oriented to person, place, and time.  Psychiatric:        Mood and Affect: Mood normal.        Behavior: Behavior normal.  LABORATORY DATA:  I have reviewed the labs as listed.  CBC Latest Ref Rng & Units 04/14/2021 03/17/2021 02/15/2021  WBC 4.0 - 10.5 K/uL 13.4(H) 11.0(H) 8.7  Hemoglobin 12.0 - 15.0 g/dL 13.6 15.3(H) 11.4(L)  Hematocrit 36.0 - 46.0 % 41.7 46.7(H) 35.5(L)  Platelets 150 - 400 K/uL 162 162 362   CMP Latest Ref Rng & Units 04/14/2021 03/17/2021 02/15/2021  Glucose 70 - 99 mg/dL 137(H) 103(H) 119(H)  BUN 6 - 20 mg/dL 19 31(H) 24(H)  Creatinine 0.44 - 1.00 mg/dL 0.67 0.69 0.68  Sodium 135 - 145 mmol/L 135 134(L) 135  Potassium 3.5 - 5.1 mmol/L 4.0 4.3 3.7  Chloride 98 - 111 mmol/L 98 97(L) 99  CO2 22 - 32 mmol/L 27 26 29   Calcium 8.9 - 10.3 mg/dL 8.9 9.5 9.1   Total Protein 6.5 - 8.1 g/dL 6.6 7.4 7.4  Total Bilirubin 0.3 - 1.2 mg/dL 0.4 0.7 0.2(L)  Alkaline Phos 38 - 126 U/L 71 76 109  AST 15 - 41 U/L 23 23 24   ALT 0 - 44 U/L 30 24 41    DIAGNOSTIC IMAGING:  I have independently reviewed the scans and discussed with the patient. CT CHEST ABDOMEN PELVIS W CONTRAST  Result Date: 04/16/2021 CLINICAL DATA:  Metastatic ovarian cancer, peritoneal carcinomatosis, status post debulking surgery, ongoing chemotherapy EXAM: CT CHEST, ABDOMEN, AND PELVIS WITH CONTRAST TECHNIQUE: Multidetector CT imaging of the chest, abdomen and pelvis was performed following the standard protocol during bolus administration of intravenous contrast. CONTRAST:  144mL OMNIPAQUE IOHEXOL 300 MG/ML SOLN, additional oral enteric contrast COMPARISON:  01/15/2021 FINDINGS: CT CHEST FINDINGS Cardiovascular: Left chest port catheter. Aortic atherosclerosis. Normal heart size. Scattered coronary artery calcifications. No pericardial effusion. Mediastinum/Nodes: Unchanged nodules or epicardial lymph nodes anterior to the right ventricle, one of which is calcified, measuring up to 1.3 cm (series 2, image 44). No other enlarged mediastinal, hilar, or axillary lymph nodes. Thyroid gland, trachea, and esophagus demonstrate no significant findings. Lungs/Pleura: No significant interval change in a moderate, multi loculated left pleural effusion with extensive, partially calcified pleural thickening and nodularity (series 2, image 39, 21). Musculoskeletal: Unchanged lateral left breast mass measuring 2.6 cm (series 2, image 27). Unchanged nodule of the inferior midline left breast measuring 0.9 cm (series 2, image 33). CT ABDOMEN PELVIS FINDINGS Hepatobiliary: No solid liver abnormality is seen. Mildly coarse, nodular contour of the liver. Unchanged hypodense lesion of the medial left lobe of the liver, hepatic segment II, measuring 1.6 x 1.3 cm (series 2, image 55). No gallstones, gallbladder wall  thickening, or biliary dilatation. Pancreas: Atrophic pancreatic tail. No pancreatic ductal dilatation or surrounding inflammatory changes. Spleen: Normal in size without significant abnormality. Adrenals/Urinary Tract: Adrenal glands are unremarkable. Kidneys are normal, without renal calculi, solid lesion, or hydronephrosis. Bladder is unremarkable. Stomach/Bowel: Stomach is within normal limits. Appendix appears normal. No evidence of bowel wall thickening, distention, or inflammatory changes. Vascular/Lymphatic: Aortic atherosclerosis. Unchanged infrarenal abdominal aortic aneurysm with a large burden of mural thrombus, measuring up to 3.6 x 3.6 cm. No enlarged abdominal or pelvic lymph nodes. Reproductive: No mass or other abnormality. Other: No abdominal wall hernia or abnormality. No abdominopelvic ascites. Unchanged calcified nodularity and peritoneal thickening, generally scattered throughout the abdomen, most notably in the left upper quadrant and in the high right pelvis. Dominant soft tissue nodule in the pelvis measures 3.6 x 2.9 cm, unchanged (series 2, image 100). Additional nodular component underlying the inferior right rectus abdominus over the right pubic symphysis  measures 4.8 x 2.4 cm, unchanged (series 2, image 111). Musculoskeletal: No acute or significant osseous findings. IMPRESSION: 1. No significant interval change in a moderate, multi loculated left pleural effusion with extensive, partially calcified pleural thickening and nodularity. 2. Unchanged nodules or epicardial lymph nodes anterior to the right ventricle, one of which is calcified. 3. Unchanged calcified nodularity and peritoneal thickening, generally scattered throughout the abdomen, most notably in the left upper quadrant and in the high right pelvis. Dominant soft tissue nodules in the pelvis are unchanged. 4. Constellation of findings is consistent with stable, treated pleural and peritoneal metastatic disease. 5. Unchanged  hypodense lesion of the medial left lobe of the liver, hepatic segment II, measuring 1.6 x 1.3 cm. This remains nonspecific; hepatic parenchymal metastases are unusual in ovarian cancer. Attention on follow-up. 6. Mildly coarse, nodular contour of the liver. This may reflect chronic parenchymal liver disease and cirrhotic change, or alternately pseudocirrhosis in the setting of treated peritoneal metastases about the liver capsule. 7. Unchanged infrarenal abdominal aortic aneurysm with a large burden of mural thrombus, measuring up to 3.6 x 3.6 cm. Recommend follow-up ultrasound every 2 years if not otherwise imaged. This recommendation follows ACR consensus guidelines: White Paper of the ACR Incidental Findings Committee II on Vascular Findings. J Am Coll Radiol 2013; 10:789-794. 8. Coronary artery disease. Aortic Atherosclerosis (ICD10-I70.0).  Unchanged masslike Electronically Signed   By: Delanna Ahmadi M.D.   On: 04/16/2021 10:28     ASSESSMENT:  1.  Stage IVa low-grade ovarian cancer: -05/05/2016-exploratory laparotomy, posterior exenteration, argon beam ablation of peritoneum, diaphragm stripping, cholecystectomy, omentectomy, appendectomy and optimal tumor debulking by Dr. Carrolyn Leigh at [facility]. -06/24/2016: Started Taxol/carboplatin.  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  Taxol discontinued cycle 5. -10/24/2016: Started maintenance letrozole. -March 2020 bevacizumab started and completed 22 cycles until 12/02/2019, discontinued due to TIA and uncontrolled hypertension. -Germline mutation testing reportedly done at [facility] was negative. -She is under the care of Dr. Alvy Bimler from July 2021 -CTAP on 04/03/2020 with stable appearance of calcified omental and mesenteric nodules.  Soft tissue fullness in the right adnexal region is stable. -MRI of the left hip on 04/23/2020 with 18 mm bone lesion in the peripheral superior aspect of the left greater trochanter -XRT to left femur lesion, 30 Gray from 05/06/2020  through 05/20/2020. -CTAP on 06/24/2020 with overall progression of calcified and noncalcified peritoneal surface and omental lesions with slight progression of disease at the left lung base involving the pleura.  Stable sclerotic bone lesion involving the left greater trochanter. -3 cycles of carboplatin and gemcitabine from 07/03/2020 through 08/17/2020. -CT CAP on 09/02/2020 showed right hilar lymph node slightly increased in size.  Calcified left lower paratracheal lymph node also slightly increased in size.  CT of the chest was compared to prior CT scan from 04/03/2020.  CTAP showed hypodense lesion in the dome of the right hepatic lobe is mildly hyperdense measuring 1.1 x 0.8 cm, previously the same.  Hypodense lesion in the lateral segment of the left hepatic lobe measures 2 x 1.7 cm, formerly 1.8 x 1.3 cm. -CA-125 has steadily improved.   2.  Social/family history: -Lives by herself.  Worked as a Land.  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  Quit smoking 2 years ago.   3.  Abdominal aneurysm: -CTAP on 06/24/2020 shows stable 3.4 x 3.3 cm infrarenal abdominal aortic aneurysm. -Used to follow with vascular surgery at [facility].   4.  Saddle  pulmonary embolism: -CT angiogram of the chest on 10/27/2020 at [facility] showed saddle pulmonary embolus with extension into the right upper lobe, lower lobe and middle lobe pulmonary arteries as well as areas of segmental and subsegmental extension.  Left pulmonary artery competent appears to involve the left main pulmonary artery with areas of segmental and subsegmental extension into the left lower lobe.  Small left pleural effusion.  Findings concerning for right heart strain.  Small left pleural effusion. - Left leg Doppler on 10/27/2020 was negative for thrombosis. - Lovenox followed by Eliquis which was started on 11/02/2020.   PLAN:  1.  Stage IVa low-grade ovarian cancer: - We have discontinued chemotherapy after  02/08/2021 due to poor tolerance. - She has some constipation but denies any major abdominal pains. - She has some cramping in the hands.  Electrolytes are normal. - We will start her on Flexeril 5 mg every 8 hours as needed for cramping. - She was instructed to take MiraLAX and mag citrate for constipation.  She is already taking stool softener. - We reviewed labs from 04/14/2021 which showed normal LFTs.  CBC was grossly normal. - CA125 has increased to 411 from 341 on 03/17/2021. - We reviewed CT CAP with contrast from 04/14/2021 which did not show any major progression. - Recommend follow-up in 3 months with repeat CT scan, CA125 and CBC.     2.  Back pain/neuropathy: - MS Contin dose increased to 45 mg in the morning-30 mg in the afternoon-45 mg at night. - Continue Dilaudid 4 mg every 4 hours as needed. - Continue gabapentin 900 mg 3 times daily. - She was evaluated by home palliative care.   3.  Hypertension: - Continue lisinopril/HCTZ daily.  Blood pressure today is 116/81.   4.  Nausea/vomiting: - Continue olanzapine 10 mg daily which is helping.   5.  Saddle pulmonary embolism: - CT angiogram on 02/08/2021 with no evidence of pulmonary embolism. - Continue Eliquis twice daily as she has active malignancy.   6.  Hypokalemia: - Continue potassium 20 mEq daily.  Potassium today is normal.  7.  Hypomagnesemia: - Continue magnesium 3 times daily.  8.  Loss of appetite: - Continue Marinol 5 mg twice daily. - Continue Ensure 2 to 3 cans daily.  She is gaining weight.   Orders placed this encounter:  No orders of the defined types were placed in this encounter.    Derek Jack, MD [place] [phone]   I, Thana Ates, am acting as a scribe for Dr. Derek Jack.  I, Derek Jack MD, have reviewed the above documentation for accuracy and completeness, and I agree with the above.

## 2021-04-21 ENCOUNTER — Inpatient Hospital Stay (HOSPITAL_COMMUNITY): Payer: Medicare Other | Attending: Hematology | Admitting: Hematology

## 2021-04-21 ENCOUNTER — Other Ambulatory Visit: Payer: Self-pay

## 2021-04-21 ENCOUNTER — Inpatient Hospital Stay (HOSPITAL_COMMUNITY): Payer: Medicare Other

## 2021-04-21 VITALS — BP 116/81 | HR 70 | Temp 97.4°F | Resp 16 | Wt 194.2 lb

## 2021-04-21 DIAGNOSIS — I2692 Saddle embolus of pulmonary artery without acute cor pulmonale: Secondary | ICD-10-CM | POA: Diagnosis not present

## 2021-04-21 DIAGNOSIS — K5900 Constipation, unspecified: Secondary | ICD-10-CM | POA: Insufficient documentation

## 2021-04-21 DIAGNOSIS — I110 Hypertensive heart disease with heart failure: Secondary | ICD-10-CM | POA: Insufficient documentation

## 2021-04-21 DIAGNOSIS — Z8673 Personal history of transient ischemic attack (TIA), and cerebral infarction without residual deficits: Secondary | ICD-10-CM | POA: Diagnosis not present

## 2021-04-21 DIAGNOSIS — C541 Malignant neoplasm of endometrium: Secondary | ICD-10-CM | POA: Diagnosis not present

## 2021-04-21 DIAGNOSIS — R112 Nausea with vomiting, unspecified: Secondary | ICD-10-CM | POA: Diagnosis not present

## 2021-04-21 DIAGNOSIS — Z79811 Long term (current) use of aromatase inhibitors: Secondary | ICD-10-CM | POA: Insufficient documentation

## 2021-04-21 DIAGNOSIS — Z79899 Other long term (current) drug therapy: Secondary | ICD-10-CM | POA: Insufficient documentation

## 2021-04-21 DIAGNOSIS — I2510 Atherosclerotic heart disease of native coronary artery without angina pectoris: Secondary | ICD-10-CM | POA: Insufficient documentation

## 2021-04-21 DIAGNOSIS — I7143 Infrarenal abdominal aortic aneurysm, without rupture: Secondary | ICD-10-CM | POA: Insufficient documentation

## 2021-04-21 DIAGNOSIS — D279 Benign neoplasm of unspecified ovary: Secondary | ICD-10-CM

## 2021-04-21 DIAGNOSIS — C563 Malignant neoplasm of bilateral ovaries: Secondary | ICD-10-CM

## 2021-04-21 DIAGNOSIS — M549 Dorsalgia, unspecified: Secondary | ICD-10-CM | POA: Diagnosis not present

## 2021-04-21 DIAGNOSIS — Z23 Encounter for immunization: Secondary | ICD-10-CM | POA: Diagnosis not present

## 2021-04-21 DIAGNOSIS — M47816 Spondylosis without myelopathy or radiculopathy, lumbar region: Secondary | ICD-10-CM | POA: Insufficient documentation

## 2021-04-21 DIAGNOSIS — I509 Heart failure, unspecified: Secondary | ICD-10-CM | POA: Diagnosis not present

## 2021-04-21 DIAGNOSIS — Z9221 Personal history of antineoplastic chemotherapy: Secondary | ICD-10-CM | POA: Diagnosis not present

## 2021-04-21 DIAGNOSIS — J90 Pleural effusion, not elsewhere classified: Secondary | ICD-10-CM

## 2021-04-21 DIAGNOSIS — I700 Atherosclerosis of aorta: Secondary | ICD-10-CM | POA: Diagnosis not present

## 2021-04-21 DIAGNOSIS — E876 Hypokalemia: Secondary | ICD-10-CM | POA: Diagnosis not present

## 2021-04-21 DIAGNOSIS — C786 Secondary malignant neoplasm of retroperitoneum and peritoneum: Secondary | ICD-10-CM

## 2021-04-21 DIAGNOSIS — C569 Malignant neoplasm of unspecified ovary: Secondary | ICD-10-CM

## 2021-04-21 DIAGNOSIS — R160 Hepatomegaly, not elsewhere classified: Secondary | ICD-10-CM | POA: Diagnosis not present

## 2021-04-21 DIAGNOSIS — G629 Polyneuropathy, unspecified: Secondary | ICD-10-CM | POA: Diagnosis not present

## 2021-04-21 DIAGNOSIS — Z87891 Personal history of nicotine dependence: Secondary | ICD-10-CM | POA: Diagnosis not present

## 2021-04-21 DIAGNOSIS — R188 Other ascites: Secondary | ICD-10-CM

## 2021-04-21 MED ORDER — ONDANSETRON HCL 8 MG PO TABS: 8.0000 mg | ORAL_TABLET | Freq: Three times a day (TID) | ORAL | 6 refills | Status: AC | PRN

## 2021-04-21 MED ORDER — CYCLOBENZAPRINE HCL 5 MG PO TABS: 5.0000 mg | ORAL_TABLET | Freq: Three times a day (TID) | ORAL | 6 refills | Status: AC | PRN

## 2021-04-21 MED ORDER — INFLUENZA VAC SPLIT QUAD 0.5 ML IM SUSY
0.5000 mL | PREFILLED_SYRINGE | Freq: Once | INTRAMUSCULAR | Status: AC
  Administered 2021-04-21: 0.5 mL via INTRAMUSCULAR
  Filled 2021-04-21: qty 0.5

## 2021-04-21 NOTE — Progress Notes (Signed)
Heidi Johnson presents today for injection per the provider's orders.  Fluarix administration without incident; injection site WNL; see MAR for injection details.  Patient tolerated procedure well and without incident. Patient remained in stable condition throughout visit.  No questions or complaints noted at this time. Patient discharged in wheelchair ambulatory with family member.

## 2021-04-21 NOTE — Patient Instructions (Signed)
[facility]  Discharge Instructions: Thank you for choosing [facility] to provide your oncology and hematology care.  If you have a lab appointment with the [facility], please come in thru the Main Entrance and check in at the main information desk.  Wear comfortable clothing and clothing appropriate for easy access to any Portacath or PICC line.   We strive to give you quality time with your provider. You may need to reschedule your appointment if you arrive late (15 or more minutes).  Arriving late affects you and other patients whose appointments are after yours.  Also, if you miss three or more appointments without notifying the office, you may be dismissed from the clinic at the provider's discretion.      For prescription refill requests, have your pharmacy contact our office and allow 72 hours for refills to be completed.    Today you received the Flu shot.   To help prevent nausea and vomiting after your treatment, we encourage you to take your nausea medication as directed.  BELOW ARE SYMPTOMS THAT SHOULD BE REPORTED IMMEDIATELY: *FEVER GREATER THAN 100.4 F (38 C) OR HIGHER *CHILLS OR SWEATING *NAUSEA AND VOMITING THAT IS NOT CONTROLLED WITH YOUR NAUSEA MEDICATION *UNUSUAL SHORTNESS OF BREATH *UNUSUAL BRUISING OR BLEEDING *URINARY PROBLEMS (pain or burning when urinating, or frequent urination) *BOWEL PROBLEMS (unusual diarrhea, constipation, pain near the anus) TENDERNESS IN MOUTH AND THROAT WITH OR WITHOUT PRESENCE OF ULCERS (sore throat, sores in mouth, or a toothache) UNUSUAL RASH, SWELLING OR PAIN  UNUSUAL VAGINAL DISCHARGE OR ITCHING   Items with * indicate a potential emergency and should be followed up as soon as possible or go to the Emergency Department if any problems should occur.  Please show the CHEMOTHERAPY ALERT CARD or IMMUNOTHERAPY ALERT CARD at check-in to the Emergency Department and triage nurse.  Should you have questions  after your visit or need to cancel or reschedule your appointment, please contact [facility] [phone]  and follow the prompts.  Office hours are 8:00 a.m. to 4:30 p.m. Monday - Friday. Please note that voicemails left after 4:00 p.m. may not be returned until the following business day.  We are closed weekends and major holidays. You have access to a nurse at all times for urgent questions. Please call the main number to the clinic [phone] and follow the prompts.  For any non-urgent questions, you may also contact your provider using MyChart. We now offer e-Visits for anyone 5 and older to request care online for non-urgent symptoms. For details visit [email or web address].   Also download the MyChart app! Go to the app store, search "MyChart", open the app, select [facility], and log in with your MyChart username and password.  Due to Covid, a mask is required upon entering the hospital/clinic. If you do not have a mask, one will be given to you upon arrival. For doctor visits, patients may have 1 support person aged 61 or older with them. For treatment visits, patients cannot have anyone with them due to current Covid guidelines and our immunocompromised population.

## 2021-05-07 ENCOUNTER — Other Ambulatory Visit: Payer: Self-pay | Admitting: Hematology and Oncology

## 2021-05-07 ENCOUNTER — Other Ambulatory Visit (HOSPITAL_COMMUNITY): Payer: Self-pay | Admitting: Physician Assistant

## 2021-05-07 DIAGNOSIS — G893 Neoplasm related pain (acute) (chronic): Secondary | ICD-10-CM

## 2021-05-07 DIAGNOSIS — C569 Malignant neoplasm of unspecified ovary: Secondary | ICD-10-CM

## 2021-05-07 MED ORDER — MORPHINE SULFATE ER 15 MG PO TBCR: 15.0000 mg | EXTENDED_RELEASE_TABLET | Freq: Two times a day (BID) | ORAL | 0 refills | Status: DC

## 2021-05-07 MED ORDER — HYDROMORPHONE HCL 4 MG PO TABS: 4.0000 mg | ORAL_TABLET | ORAL | 0 refills | Status: AC | PRN

## 2021-05-07 MED ORDER — MORPHINE SULFATE ER 15 MG PO TBCR: 15.0000 mg | EXTENDED_RELEASE_TABLET | Freq: Two times a day (BID) | ORAL | 0 refills | Status: AC

## 2021-05-08 ENCOUNTER — Other Ambulatory Visit (HOSPITAL_COMMUNITY): Payer: Self-pay | Admitting: Hematology

## 2021-05-10 ENCOUNTER — Encounter: Payer: Self-pay | Admitting: Hematology and Oncology

## 2021-05-10 ENCOUNTER — Other Ambulatory Visit (HOSPITAL_COMMUNITY): Payer: Self-pay | Admitting: *Deleted

## 2021-05-10 ENCOUNTER — Encounter: Payer: Self-pay | Admitting: Hematology

## 2021-05-12 ENCOUNTER — Other Ambulatory Visit (HOSPITAL_COMMUNITY): Payer: Self-pay

## 2021-05-12 DIAGNOSIS — C786 Secondary malignant neoplasm of retroperitoneum and peritoneum: Secondary | ICD-10-CM

## 2021-05-12 DIAGNOSIS — C569 Malignant neoplasm of unspecified ovary: Secondary | ICD-10-CM

## 2021-05-12 MED ORDER — MORPHINE SULFATE ER 30 MG PO TBCR: 30.0000 mg | EXTENDED_RELEASE_TABLET | Freq: Three times a day (TID) | ORAL | 0 refills | Status: AC

## 2021-05-17 ENCOUNTER — Telehealth (HOSPITAL_COMMUNITY): Payer: Self-pay | Admitting: *Deleted

## 2021-05-17 ENCOUNTER — Other Ambulatory Visit (HOSPITAL_COMMUNITY): Payer: Self-pay | Admitting: *Deleted

## 2021-05-17 NOTE — Telephone Encounter (Signed)
Correct contact Amy Poindexter from Home care rather than [facility].

## 2021-05-17 NOTE — Telephone Encounter (Signed)
Verified with [facility] that 90 tablets were filled of MS Contin 30 mg on 11/23 and # 60 of 15 mg MS Contin on 11/19.  [place] Nurse had called to advise that patient stated that she had not gotten enough pills in her delivery.

## 2021-05-24 ENCOUNTER — Other Ambulatory Visit (HOSPITAL_COMMUNITY): Payer: Self-pay | Admitting: Hematology

## 2021-07-15 ENCOUNTER — Encounter: Payer: Self-pay | Admitting: Hematology

## 2021-07-15 ENCOUNTER — Encounter: Payer: Self-pay | Admitting: Hematology and Oncology

## 2021-07-21 ENCOUNTER — Encounter: Payer: Self-pay | Admitting: Hematology

## 2021-07-21 ENCOUNTER — Encounter: Payer: Self-pay | Admitting: Hematology and Oncology

## 2021-07-22 ENCOUNTER — Inpatient Hospital Stay (HOSPITAL_COMMUNITY): Payer: Medicare Other | Attending: Hematology

## 2021-07-22 ENCOUNTER — Ambulatory Visit (HOSPITAL_COMMUNITY)

## 2021-07-22 ENCOUNTER — Encounter: Payer: Self-pay | Admitting: Hematology and Oncology

## 2021-07-22 ENCOUNTER — Encounter: Payer: Self-pay | Admitting: Hematology

## 2021-07-29 ENCOUNTER — Inpatient Hospital Stay (HOSPITAL_COMMUNITY): Payer: Medicare Other | Admitting: Hematology

## 2021-07-29 ENCOUNTER — Encounter (HOSPITAL_COMMUNITY): Payer: Medicare Other

## 2021-08-18 DEATH — deceased

## 2021-08-31 ENCOUNTER — Encounter: Payer: Self-pay | Admitting: Hematology and Oncology

## 2021-08-31 ENCOUNTER — Encounter: Payer: Self-pay | Admitting: Hematology

## 2022-08-17 IMAGING — CT CT CHEST-ABD-PELV W/ CM
2 of 5 series · 14 of 46 positions shown, 16 images · IV contrast (APPLIED)
Comparison: 10/24/2020 abdominopelvic CT. Chest abdomen and pelvic
staging CTs of 09/02/2020.

CLINICAL DATA: Ovarian cancer. Evaluate treatment response. Started
chemotherapy 3-4 months ago.

EXAM:
CT CHEST, ABDOMEN, AND PELVIS WITH CONTRAST
TECHNIQUE: Multidetector CT imaging of the chest, abdomen and pelvis was
performed following the standard protocol during bolus
administration of intravenous contrast.
CONTRAST:  75mL OMNIPAQUE IOHEXOL 300 MG/ML  SOLN

[Series 2: cap with · axial · 0.81mm/px · z∈[+767,+1317]mm · 11 of 132 slices shown, 13 images]
[im 11/132  soft-tissue]
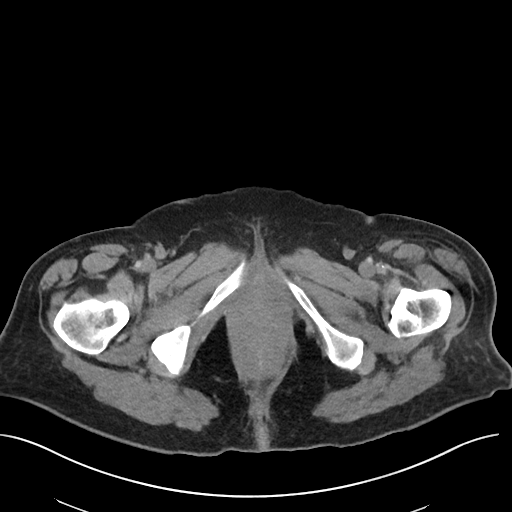
[im 11/132  bone]
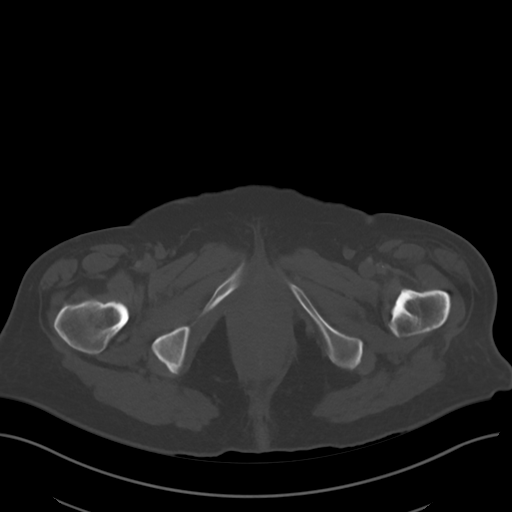
[im 21/132  soft-tissue]
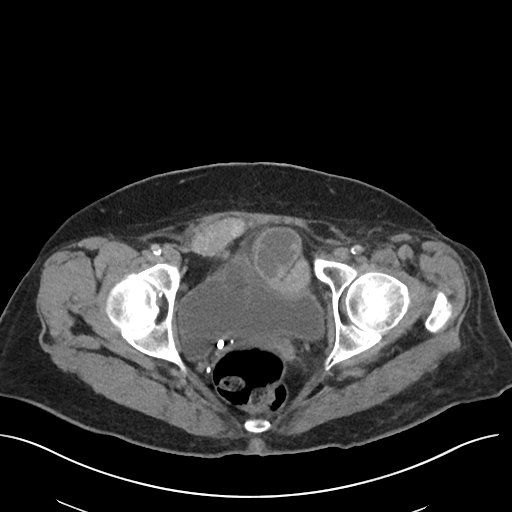
[im 31/132  soft-tissue]
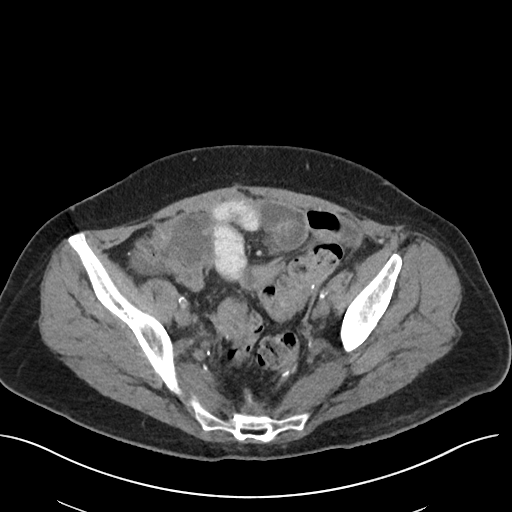
[im 41/132  soft-tissue]
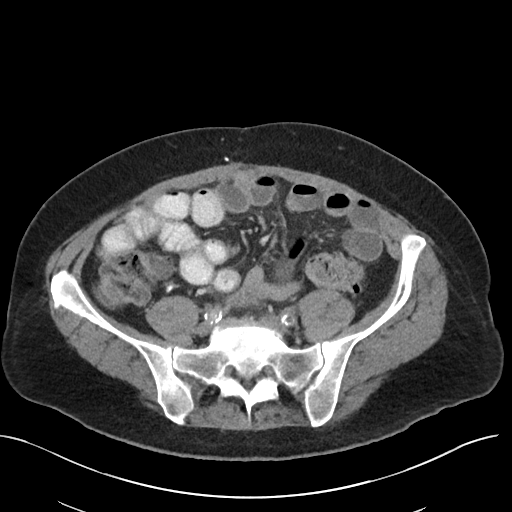
[im 51/132  soft-tissue]
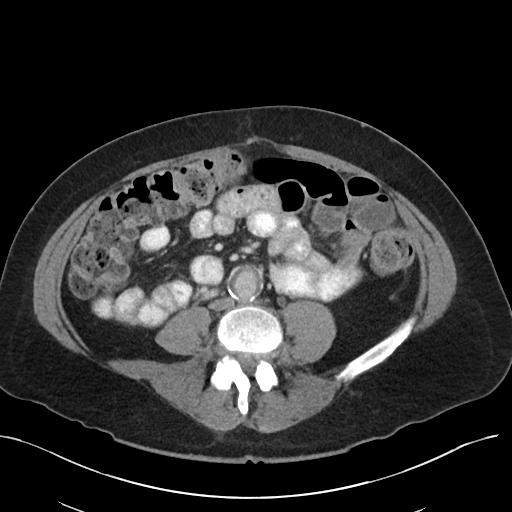
[im 71/132  soft-tissue]
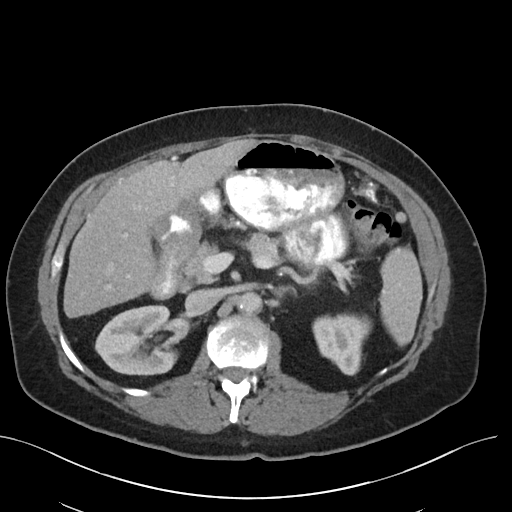
[im 81/132  soft-tissue]
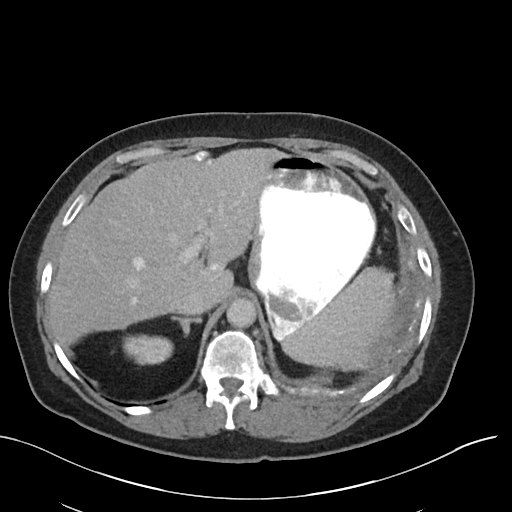
[im 91/132  soft-tissue]
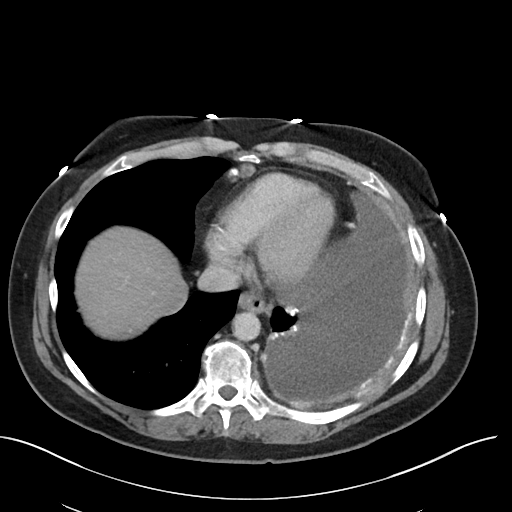
[im 101/132  soft-tissue]
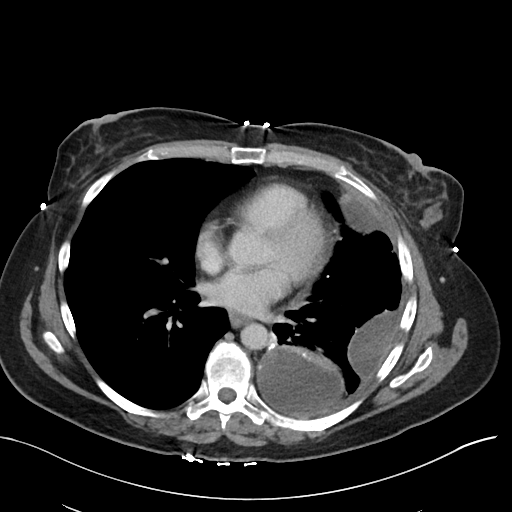
[im 101/132  bone]
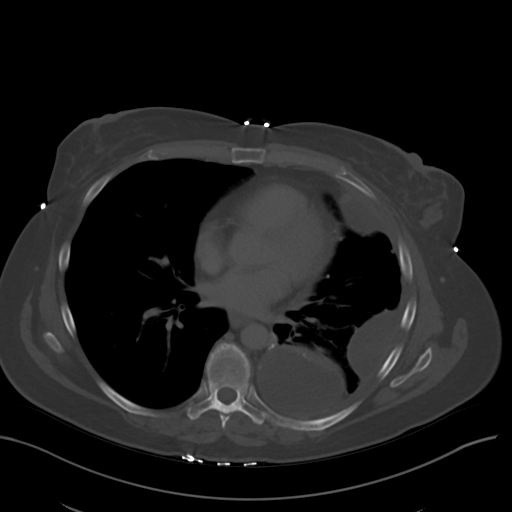
[im 111/132  soft-tissue]
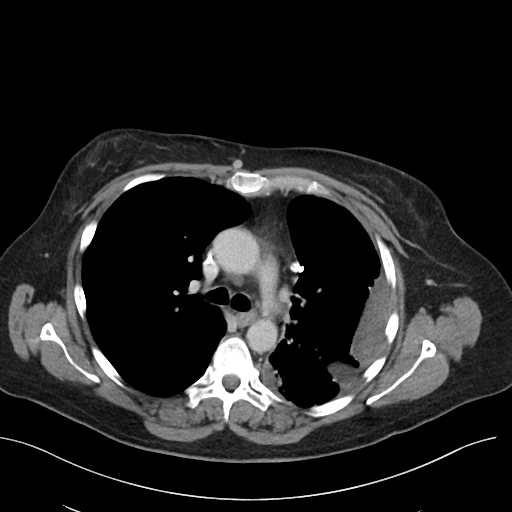
[im 121/132  soft-tissue]
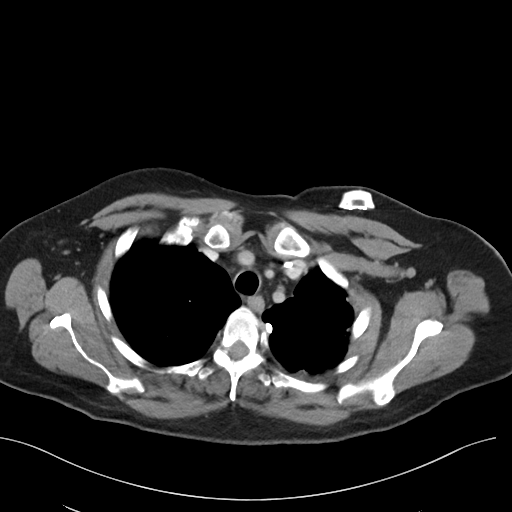

[Series 4: coronal · coronal · 0.92mm/px · 3 of 93 slices shown]
[im 31/93  soft-tissue]
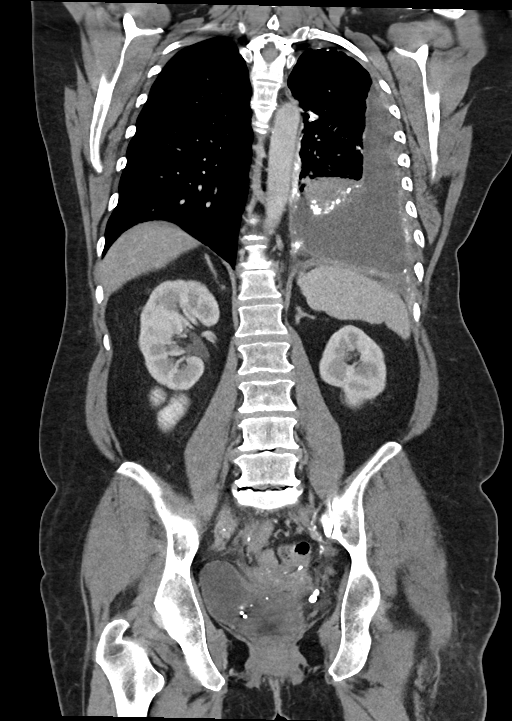
[im 41/93  soft-tissue]
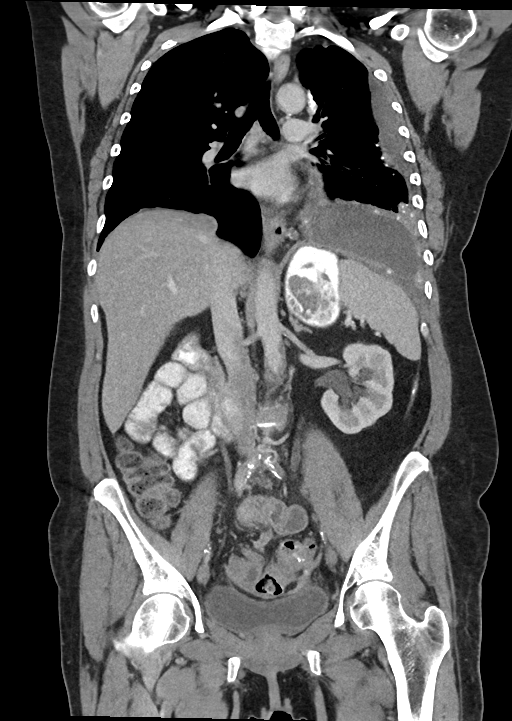
[im 52/93  soft-tissue]
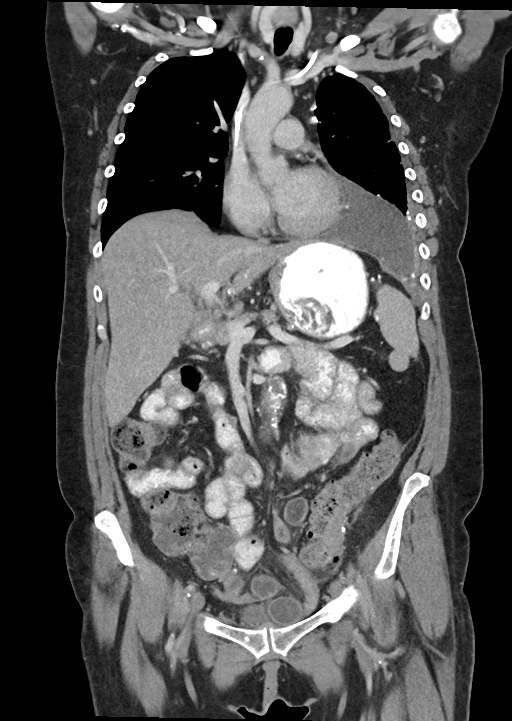

[14 of 46 positions shown; findings below may reference images not displayed]

FINDINGS: CT CHEST FINDINGS

Cardiovascular: Left Port-A-Cath tip at mid right atrium. Aortic
atherosclerosis. Normal heart size, without pericardial effusion.
Lad coronary artery calcification. No central pulmonary embolism, on
this non-dedicated study.

Mediastinum/Nodes: No supraclavicular adenopathy. No axillary
adenopathy. No mediastinal or hilar adenopathy.

Lungs/Pleura: No right-sided pleural effusion. Partially loculated
left-sided pleural effusion is small and similar to 09/02/2020 and
10/24/2020. Again identified is irregularity along the pleural
surface with areas of calcification, relatively similar.

Mild centrilobular emphysema.

Left base rounded atelectasis.

Musculoskeletal: Similar lateral left breast nodularity at 2.1 cm. A
nodule inferior and medial to this measures 8 mm on 34/2 and is new.
Sternal manubrial 9 mm sclerotic lesion is similar.

CT ABDOMEN PELVIS FINDINGS

Hepatobiliary: Irregular hepatic capsule, suspicious for cirrhosis.
Vague area of hypoattenuation within segment 2 on 54/2 measures
cm today and is similar to on the prior.

The hyperattenuating tiny hepatic dome lesion on the prior exam is
less distinct today at 4 mm on 44/2.

Cholecystectomy, without biliary ductal dilatation.

Pancreas: Atrophic versus developmentally/surgically absent
pancreatic tail.

Spleen: Extensive capsular calcifications throughout the spleen are
unchanged. No splenomegaly.

Adrenals/Urinary Tract: Normal adrenal glands. Normal right kidney.
Mild left-sided caliectasis is new. No definite cause identified.
Normal urinary bladder.

Stomach/Bowel: Normal stomach, without wall thickening. Colonic
stool burden suggests constipation. Normal terminal ileum. Normal
small bowel caliber.

Vascular/Lymphatic: Advanced aortic and branch vessel
atherosclerosis. Infrarenal abdominal aortic dilatation at 3.5 cm on
72/2, 3.3 cm on the prior. No abdominopelvic adenopathy.

Reproductive: Hysterectomy.  No adnexal mass.

Other: No significant free fluid. Again identified are primarily
calcified peritoneal and omental nodules. the index area of
partially calcified left omental soft tissue thickening measures
cm on 63/2 and is similar to minimally increased compared to 1.0 cm
on 10/24/2020 (when remeasured).

Calcified implant on the falciform ligament measures 11 mm on 60/2
and is unchanged.

Implant along the undersurface of the right inferior rectus muscle
measures 4.3 x 2.1 cm on 113/2 versus 4.2 x 1.8 cm on 10/24/2020
(when remeasured).

No free intraperitoneal air.

Musculoskeletal: Left femoral neck sclerotic lesion is similar at
1.7 cm, favoring a bone island. Degenerative disc disease at L4-5
and L5-S1 with trace L4-5 anterolisthesis.
IMPRESSION: CT CHEST IMPRESSION

1. Left-sided loculated malignant pleural effusion, similar to prior
exams.
2. No new or progressive disease within the chest.
3. Aortic atherosclerosis (DJC08-YDJ.J) and emphysema (DJC08-P1X.L).
coronary artery atherosclerosis.
4. Left breast nodularity, including a more medial and inferior 8 mm
nodule, nonspecific.

CT ABDOMEN AND PELVIS IMPRESSION

1. Omental/peritoneal metastasis, similar to mildly progressive
compared to 10/24/2020 as detailed above. No bowel obstruction or
other acute complication.
2. Probable cirrhosis. Segment 2 low-density liver lesion is
nonspecific and similar to on prior exams.
3. Aortic Atherosclerosis (DJC08-YDJ.J). Infrarenal aortic
dilatation at 3.5 cm. Recommend follow-up ultrasound every 2 years.
This recommendation follows ACR consensus guidelines: White Paper of
the ACR Incidental Findings Committee II on Vascular Findings. [HOSPITAL] 6899; [DATE].
4. New mild left-sided caliectasis, without cause identified.
5.  Possible constipation.

## 2022-09-06 ENCOUNTER — Encounter: Payer: Self-pay | Admitting: Hematology and Oncology

## 2023-04-14 ENCOUNTER — Encounter: Payer: Self-pay | Admitting: Hematology and Oncology
